# Patient Record
Sex: Male | Born: 1955 | Race: White | Hispanic: No | Marital: Married | Smoking: Current every day smoker
Health system: Southern US, Community
[De-identification: ages and names within clinical notes are randomized; demographics above are authoritative.]

## PROBLEM LIST (undated history)

## (undated) DIAGNOSIS — E559 Vitamin D deficiency, unspecified: Secondary | ICD-10-CM

## (undated) DIAGNOSIS — E785 Hyperlipidemia, unspecified: Secondary | ICD-10-CM

## (undated) DIAGNOSIS — M797 Fibromyalgia: Secondary | ICD-10-CM

## (undated) DIAGNOSIS — M545 Low back pain, unspecified: Secondary | ICD-10-CM

## (undated) DIAGNOSIS — I4891 Unspecified atrial fibrillation: Secondary | ICD-10-CM

## (undated) DIAGNOSIS — M1990 Unspecified osteoarthritis, unspecified site: Secondary | ICD-10-CM

## (undated) DIAGNOSIS — G8929 Other chronic pain: Secondary | ICD-10-CM

## (undated) DIAGNOSIS — I340 Nonrheumatic mitral (valve) insufficiency: Secondary | ICD-10-CM

## (undated) DIAGNOSIS — E119 Type 2 diabetes mellitus without complications: Secondary | ICD-10-CM

## (undated) DIAGNOSIS — I10 Essential (primary) hypertension: Secondary | ICD-10-CM

## (undated) HISTORY — DX: Type 2 diabetes mellitus without complications: E11.9

## (undated) HISTORY — PX: HERNIA REPAIR: SHX51

## (undated) HISTORY — DX: Low back pain: M54.5

## (undated) HISTORY — DX: Fibromyalgia: M79.7

## (undated) HISTORY — DX: Low back pain, unspecified: M54.50

## (undated) HISTORY — DX: Other chronic pain: G89.29

## (undated) HISTORY — DX: Vitamin D deficiency, unspecified: E55.9

## (undated) HISTORY — DX: Unspecified atrial fibrillation: I48.91

## (undated) HISTORY — DX: Nonrheumatic mitral (valve) insufficiency: I34.0

## (undated) HISTORY — DX: Essential (primary) hypertension: I10

## (undated) HISTORY — DX: Hyperlipidemia, unspecified: E78.5

---

## 2009-01-24 ENCOUNTER — Encounter: Admission: RE | Admit: 2009-01-24 | Discharge: 2009-04-24 | Payer: Self-pay | Admitting: Family Medicine

## 2009-04-26 ENCOUNTER — Encounter: Admission: RE | Admit: 2009-04-26 | Discharge: 2009-07-25 | Payer: Self-pay | Admitting: Family Medicine

## 2010-02-18 ENCOUNTER — Encounter
Admission: RE | Admit: 2010-02-18 | Discharge: 2010-02-20 | Payer: Self-pay | Admitting: Physical Medicine & Rehabilitation

## 2010-02-20 ENCOUNTER — Ambulatory Visit: Payer: Self-pay | Admitting: Physical Medicine & Rehabilitation

## 2010-05-07 ENCOUNTER — Encounter
Admission: RE | Admit: 2010-05-07 | Discharge: 2010-05-07 | Payer: Self-pay | Admitting: Physical Medicine & Rehabilitation

## 2011-07-22 ENCOUNTER — Ambulatory Visit: Payer: Medicare Other | Attending: Anesthesiology | Admitting: Physical Therapy

## 2011-07-22 DIAGNOSIS — M545 Low back pain, unspecified: Secondary | ICD-10-CM | POA: Insufficient documentation

## 2011-07-22 DIAGNOSIS — R5381 Other malaise: Secondary | ICD-10-CM | POA: Insufficient documentation

## 2011-07-22 DIAGNOSIS — IMO0001 Reserved for inherently not codable concepts without codable children: Secondary | ICD-10-CM | POA: Insufficient documentation

## 2011-09-28 ENCOUNTER — Encounter (HOSPITAL_BASED_OUTPATIENT_CLINIC_OR_DEPARTMENT_OTHER): Payer: Medicare Other

## 2011-09-30 ENCOUNTER — Encounter (HOSPITAL_BASED_OUTPATIENT_CLINIC_OR_DEPARTMENT_OTHER): Payer: Medicare Other

## 2011-12-01 DIAGNOSIS — G541 Lumbosacral plexus disorders: Secondary | ICD-10-CM | POA: Diagnosis not present

## 2011-12-01 DIAGNOSIS — G608 Other hereditary and idiopathic neuropathies: Secondary | ICD-10-CM | POA: Diagnosis not present

## 2011-12-01 DIAGNOSIS — M545 Low back pain, unspecified: Secondary | ICD-10-CM | POA: Diagnosis not present

## 2011-12-01 DIAGNOSIS — F341 Dysthymic disorder: Secondary | ICD-10-CM | POA: Diagnosis not present

## 2011-12-30 DIAGNOSIS — G541 Lumbosacral plexus disorders: Secondary | ICD-10-CM | POA: Diagnosis not present

## 2011-12-30 DIAGNOSIS — F341 Dysthymic disorder: Secondary | ICD-10-CM | POA: Diagnosis not present

## 2011-12-30 DIAGNOSIS — H81399 Other peripheral vertigo, unspecified ear: Secondary | ICD-10-CM | POA: Diagnosis not present

## 2011-12-30 DIAGNOSIS — M542 Cervicalgia: Secondary | ICD-10-CM | POA: Diagnosis not present

## 2011-12-30 DIAGNOSIS — M545 Low back pain, unspecified: Secondary | ICD-10-CM | POA: Diagnosis not present

## 2011-12-30 DIAGNOSIS — R269 Unspecified abnormalities of gait and mobility: Secondary | ICD-10-CM | POA: Diagnosis not present

## 2011-12-30 DIAGNOSIS — G608 Other hereditary and idiopathic neuropathies: Secondary | ICD-10-CM | POA: Diagnosis not present

## 2011-12-30 DIAGNOSIS — Z79899 Other long term (current) drug therapy: Secondary | ICD-10-CM | POA: Diagnosis not present

## 2012-01-23 DIAGNOSIS — I10 Essential (primary) hypertension: Secondary | ICD-10-CM | POA: Diagnosis not present

## 2012-01-23 DIAGNOSIS — E785 Hyperlipidemia, unspecified: Secondary | ICD-10-CM | POA: Diagnosis not present

## 2012-01-27 DIAGNOSIS — G541 Lumbosacral plexus disorders: Secondary | ICD-10-CM | POA: Diagnosis not present

## 2012-01-27 DIAGNOSIS — H60509 Unspecified acute noninfective otitis externa, unspecified ear: Secondary | ICD-10-CM | POA: Diagnosis not present

## 2012-01-27 DIAGNOSIS — F341 Dysthymic disorder: Secondary | ICD-10-CM | POA: Diagnosis not present

## 2012-01-27 DIAGNOSIS — G608 Other hereditary and idiopathic neuropathies: Secondary | ICD-10-CM | POA: Diagnosis not present

## 2012-01-27 DIAGNOSIS — H81399 Other peripheral vertigo, unspecified ear: Secondary | ICD-10-CM | POA: Diagnosis not present

## 2012-02-05 DIAGNOSIS — R51 Headache: Secondary | ICD-10-CM | POA: Diagnosis not present

## 2012-03-03 DIAGNOSIS — G541 Lumbosacral plexus disorders: Secondary | ICD-10-CM | POA: Diagnosis not present

## 2012-03-03 DIAGNOSIS — G608 Other hereditary and idiopathic neuropathies: Secondary | ICD-10-CM | POA: Diagnosis not present

## 2012-03-03 DIAGNOSIS — H81399 Other peripheral vertigo, unspecified ear: Secondary | ICD-10-CM | POA: Diagnosis not present

## 2012-03-03 DIAGNOSIS — F341 Dysthymic disorder: Secondary | ICD-10-CM | POA: Diagnosis not present

## 2012-03-17 DIAGNOSIS — E785 Hyperlipidemia, unspecified: Secondary | ICD-10-CM | POA: Diagnosis not present

## 2012-03-17 DIAGNOSIS — IMO0001 Reserved for inherently not codable concepts without codable children: Secondary | ICD-10-CM | POA: Diagnosis not present

## 2012-03-17 DIAGNOSIS — Z1211 Encounter for screening for malignant neoplasm of colon: Secondary | ICD-10-CM | POA: Diagnosis not present

## 2012-03-17 DIAGNOSIS — M129 Arthropathy, unspecified: Secondary | ICD-10-CM | POA: Diagnosis not present

## 2012-03-17 DIAGNOSIS — F17200 Nicotine dependence, unspecified, uncomplicated: Secondary | ICD-10-CM | POA: Diagnosis not present

## 2012-03-17 DIAGNOSIS — D126 Benign neoplasm of colon, unspecified: Secondary | ICD-10-CM | POA: Diagnosis not present

## 2012-03-17 DIAGNOSIS — Z79899 Other long term (current) drug therapy: Secondary | ICD-10-CM | POA: Diagnosis not present

## 2012-03-17 DIAGNOSIS — I10 Essential (primary) hypertension: Secondary | ICD-10-CM | POA: Diagnosis not present

## 2012-03-17 LAB — HM COLONOSCOPY

## 2012-05-07 DIAGNOSIS — M542 Cervicalgia: Secondary | ICD-10-CM | POA: Diagnosis not present

## 2012-05-07 DIAGNOSIS — G541 Lumbosacral plexus disorders: Secondary | ICD-10-CM | POA: Diagnosis not present

## 2012-05-07 DIAGNOSIS — G608 Other hereditary and idiopathic neuropathies: Secondary | ICD-10-CM | POA: Diagnosis not present

## 2012-05-07 DIAGNOSIS — H81399 Other peripheral vertigo, unspecified ear: Secondary | ICD-10-CM | POA: Diagnosis not present

## 2012-05-07 DIAGNOSIS — Z79899 Other long term (current) drug therapy: Secondary | ICD-10-CM | POA: Diagnosis not present

## 2012-05-07 DIAGNOSIS — F341 Dysthymic disorder: Secondary | ICD-10-CM | POA: Diagnosis not present

## 2012-06-04 DIAGNOSIS — G541 Lumbosacral plexus disorders: Secondary | ICD-10-CM | POA: Diagnosis not present

## 2012-06-04 DIAGNOSIS — F341 Dysthymic disorder: Secondary | ICD-10-CM | POA: Diagnosis not present

## 2012-06-04 DIAGNOSIS — G608 Other hereditary and idiopathic neuropathies: Secondary | ICD-10-CM | POA: Diagnosis not present

## 2012-06-04 DIAGNOSIS — H81399 Other peripheral vertigo, unspecified ear: Secondary | ICD-10-CM | POA: Diagnosis not present

## 2012-07-27 DIAGNOSIS — F341 Dysthymic disorder: Secondary | ICD-10-CM | POA: Diagnosis not present

## 2012-07-27 DIAGNOSIS — H81399 Other peripheral vertigo, unspecified ear: Secondary | ICD-10-CM | POA: Diagnosis not present

## 2012-07-27 DIAGNOSIS — Z79899 Other long term (current) drug therapy: Secondary | ICD-10-CM | POA: Diagnosis not present

## 2012-07-27 DIAGNOSIS — M542 Cervicalgia: Secondary | ICD-10-CM | POA: Diagnosis not present

## 2012-07-27 DIAGNOSIS — G608 Other hereditary and idiopathic neuropathies: Secondary | ICD-10-CM | POA: Diagnosis not present

## 2012-07-27 DIAGNOSIS — G541 Lumbosacral plexus disorders: Secondary | ICD-10-CM | POA: Diagnosis not present

## 2012-07-30 DIAGNOSIS — E785 Hyperlipidemia, unspecified: Secondary | ICD-10-CM | POA: Diagnosis not present

## 2012-07-30 DIAGNOSIS — I10 Essential (primary) hypertension: Secondary | ICD-10-CM | POA: Diagnosis not present

## 2012-07-31 LAB — BASIC METABOLIC PANEL
Creatinine: 1 mg/dL (ref 0.6–1.3)
Glucose: 93 mg/dL
Potassium: 4.2 mmol/L (ref 3.4–5.3)
Sodium: 140 mmol/L (ref 137–147)

## 2012-07-31 LAB — HEPATIC FUNCTION PANEL
ALT: 41 U/L — AB (ref 10–40)
AST: 33 U/L (ref 14–40)
Alkaline Phosphatase: 55 U/L (ref 25–125)

## 2012-07-31 LAB — LIPID PANEL
Cholesterol: 206 mg/dL — AB (ref 0–200)
HDL: 43 mg/dL (ref 35–70)
LDL Cholesterol: 86 mg/dL
Triglycerides: 385 mg/dL — AB (ref 40–160)

## 2012-08-16 DIAGNOSIS — H81399 Other peripheral vertigo, unspecified ear: Secondary | ICD-10-CM | POA: Diagnosis not present

## 2012-08-16 DIAGNOSIS — G608 Other hereditary and idiopathic neuropathies: Secondary | ICD-10-CM | POA: Diagnosis not present

## 2012-08-16 DIAGNOSIS — F341 Dysthymic disorder: Secondary | ICD-10-CM | POA: Diagnosis not present

## 2012-08-16 DIAGNOSIS — G541 Lumbosacral plexus disorders: Secondary | ICD-10-CM | POA: Diagnosis not present

## 2012-10-04 DIAGNOSIS — H81399 Other peripheral vertigo, unspecified ear: Secondary | ICD-10-CM | POA: Diagnosis not present

## 2012-10-04 DIAGNOSIS — G608 Other hereditary and idiopathic neuropathies: Secondary | ICD-10-CM | POA: Diagnosis not present

## 2012-10-04 DIAGNOSIS — F341 Dysthymic disorder: Secondary | ICD-10-CM | POA: Diagnosis not present

## 2012-10-04 DIAGNOSIS — G541 Lumbosacral plexus disorders: Secondary | ICD-10-CM | POA: Diagnosis not present

## 2012-10-19 DIAGNOSIS — R1012 Left upper quadrant pain: Secondary | ICD-10-CM | POA: Diagnosis not present

## 2012-10-19 DIAGNOSIS — R319 Hematuria, unspecified: Secondary | ICD-10-CM | POA: Diagnosis not present

## 2012-11-08 DIAGNOSIS — M542 Cervicalgia: Secondary | ICD-10-CM | POA: Diagnosis not present

## 2012-11-08 DIAGNOSIS — Z79899 Other long term (current) drug therapy: Secondary | ICD-10-CM | POA: Diagnosis not present

## 2012-11-08 DIAGNOSIS — G541 Lumbosacral plexus disorders: Secondary | ICD-10-CM | POA: Diagnosis not present

## 2012-11-08 DIAGNOSIS — H81399 Other peripheral vertigo, unspecified ear: Secondary | ICD-10-CM | POA: Diagnosis not present

## 2012-11-08 DIAGNOSIS — F341 Dysthymic disorder: Secondary | ICD-10-CM | POA: Diagnosis not present

## 2012-11-08 DIAGNOSIS — G608 Other hereditary and idiopathic neuropathies: Secondary | ICD-10-CM | POA: Diagnosis not present

## 2012-12-06 DIAGNOSIS — H81399 Other peripheral vertigo, unspecified ear: Secondary | ICD-10-CM | POA: Diagnosis not present

## 2012-12-06 DIAGNOSIS — F341 Dysthymic disorder: Secondary | ICD-10-CM | POA: Diagnosis not present

## 2012-12-06 DIAGNOSIS — G541 Lumbosacral plexus disorders: Secondary | ICD-10-CM | POA: Diagnosis not present

## 2012-12-06 DIAGNOSIS — G608 Other hereditary and idiopathic neuropathies: Secondary | ICD-10-CM | POA: Diagnosis not present

## 2013-01-10 DIAGNOSIS — M542 Cervicalgia: Secondary | ICD-10-CM | POA: Diagnosis not present

## 2013-01-10 DIAGNOSIS — G608 Other hereditary and idiopathic neuropathies: Secondary | ICD-10-CM | POA: Diagnosis not present

## 2013-01-10 DIAGNOSIS — G541 Lumbosacral plexus disorders: Secondary | ICD-10-CM | POA: Diagnosis not present

## 2013-01-10 DIAGNOSIS — H81399 Other peripheral vertigo, unspecified ear: Secondary | ICD-10-CM | POA: Diagnosis not present

## 2013-01-10 DIAGNOSIS — F341 Dysthymic disorder: Secondary | ICD-10-CM | POA: Diagnosis not present

## 2013-01-10 DIAGNOSIS — Z79899 Other long term (current) drug therapy: Secondary | ICD-10-CM | POA: Diagnosis not present

## 2013-01-13 ENCOUNTER — Encounter: Payer: Self-pay | Admitting: *Deleted

## 2013-01-13 DIAGNOSIS — G8929 Other chronic pain: Secondary | ICD-10-CM | POA: Insufficient documentation

## 2013-01-13 DIAGNOSIS — E785 Hyperlipidemia, unspecified: Secondary | ICD-10-CM | POA: Insufficient documentation

## 2013-01-13 DIAGNOSIS — I10 Essential (primary) hypertension: Secondary | ICD-10-CM

## 2013-01-13 DIAGNOSIS — E1159 Type 2 diabetes mellitus with other circulatory complications: Secondary | ICD-10-CM | POA: Insufficient documentation

## 2013-01-13 DIAGNOSIS — M797 Fibromyalgia: Secondary | ICD-10-CM | POA: Insufficient documentation

## 2013-01-13 DIAGNOSIS — E559 Vitamin D deficiency, unspecified: Secondary | ICD-10-CM | POA: Insufficient documentation

## 2013-01-28 ENCOUNTER — Ambulatory Visit: Payer: Medicare Other

## 2013-01-28 ENCOUNTER — Ambulatory Visit (INDEPENDENT_AMBULATORY_CARE_PROVIDER_SITE_OTHER): Payer: Medicare Other | Admitting: Nurse Practitioner

## 2013-01-28 ENCOUNTER — Encounter: Payer: Self-pay | Admitting: Nurse Practitioner

## 2013-01-28 VITALS — BP 134/88 | HR 71 | Temp 99.2°F | Ht 71.0 in | Wt 207.0 lb

## 2013-01-28 DIAGNOSIS — E785 Hyperlipidemia, unspecified: Secondary | ICD-10-CM

## 2013-01-28 DIAGNOSIS — R079 Chest pain, unspecified: Secondary | ICD-10-CM

## 2013-01-28 DIAGNOSIS — K5900 Constipation, unspecified: Secondary | ICD-10-CM | POA: Diagnosis not present

## 2013-01-28 DIAGNOSIS — I10 Essential (primary) hypertension: Secondary | ICD-10-CM

## 2013-01-28 DIAGNOSIS — R0781 Pleurodynia: Secondary | ICD-10-CM

## 2013-01-28 LAB — COMPLETE METABOLIC PANEL WITH GFR
ALT: 39 U/L (ref 0–53)
AST: 34 U/L (ref 0–37)
Albumin: 5 g/dL (ref 3.5–5.2)
Alkaline Phosphatase: 56 U/L (ref 39–117)
BUN: 21 mg/dL (ref 6–23)
CO2: 27 mEq/L (ref 19–32)
Calcium: 10.2 mg/dL (ref 8.4–10.5)
Chloride: 100 mEq/L (ref 96–112)
Creat: 1 mg/dL (ref 0.50–1.35)
GFR, Est African American: >89 mL/min
GFR, Est Non African American: 83 mL/min
Glucose, Bld: 99 mg/dL (ref 70–99)
Potassium: 4.7 mEq/L (ref 3.5–5.3)
Sodium: 139 mEq/L (ref 135–145)
Total Bilirubin: 0.5 mg/dL (ref 0.3–1.2)
Total Protein: 7.5 g/dL (ref 6.0–8.3)

## 2013-01-28 MED ORDER — FELODIPINE ER 5 MG PO TB24: 5.0000 mg | ORAL_TABLET | Freq: Every day | ORAL | Status: DC

## 2013-01-28 MED ORDER — LOSARTAN POTASSIUM 100 MG PO TABS: 100.0000 mg | ORAL_TABLET | Freq: Every day | ORAL | Status: DC

## 2013-01-28 MED ORDER — FENOFIBRATE 160 MG PO TABS: 160.0000 mg | ORAL_TABLET | Freq: Every day | ORAL | Status: DC

## 2013-01-28 MED ORDER — PRAVASTATIN SODIUM 40 MG PO TABS: 40.0000 mg | ORAL_TABLET | Freq: Every day | ORAL | Status: DC

## 2013-01-28 MED ORDER — HYDROCHLOROTHIAZIDE 25 MG PO TABS: 25.0000 mg | ORAL_TABLET | Freq: Every day | ORAL | Status: DC

## 2013-01-28 NOTE — Patient Instructions (Signed)

## 2013-01-28 NOTE — Progress Notes (Signed)
  Subjective:    Patient ID: Michael Valenzuela, male    DOB: 1956/06/15, 57 y.o.   MRN: [id number]  HPI Patient in complaining of pain in L side. Started . intermittent. Rates pain 4/10. Nothing Helps pain. Movement increases pain. Describes the pain as dull. Denies injury.  Patient Active Problem List  Diagnosis  . Fibromyalgia  . Chronic low back pain  . Vitamin D deficiency  . Essential hypertension, benign  . Other and unspecified hyperlipidemia   Current outpatient prescriptions:felodipine (PLENDIL) 5 MG 24 hr tablet, Take 5 mg by mouth daily., Disp: , Rfl: ;  fenofibrate 160 MG tablet, Take 160 mg by mouth daily., Disp: , Rfl: ;  hydrochlorothiazide (HYDRODIURIL) 25 MG tablet, Take 25 mg by mouth daily., Disp: , Rfl: ;  losartan (COZAAR) 100 MG tablet, Take 100 mg by mouth daily., Disp: , Rfl:  oxyCODONE (ROXICODONE) 15 MG immediate release tablet, Take 15 mg by mouth 4 (four) times daily., Disp: , Rfl: ;  pravastatin (PRAVACHOL) 40 MG tablet, Take 40 mg by mouth daily., Disp: , Rfl: ;  rosuvastatin (CRESTOR) 10 MG tablet, Take 10 mg by mouth daily., Disp: , Rfl:     Review of Systems  Constitutional: Negative.   Respiratory: Negative.   Cardiovascular: Negative.   Musculoskeletal: Positive for myalgias (L side).  Neurological: Negative.        Objective:   Physical Exam  Constitutional: He appears well-developed and well-nourished.  HENT:  Head: Normocephalic and atraumatic.  Right Ear: External ear normal.  Left Ear: External ear normal.  Eyes: Conjunctivae and EOM are normal. Pupils are equal, round, and reactive to light.  Neck: Normal range of motion.  Cardiovascular: Normal rate, regular rhythm and normal heart sounds.   Pulmonary/Chest: Effort normal and breath sounds normal.  Abdominal: Soft. Bowel sounds are normal.  Musculoskeletal:  L side tenderness on palpation  Skin: Skin is warm and dry.   BP 134/88  Pulse 71  Temp(Src) 99.2 F (37.3 C) (Oral)  Ht  5\' 11"  (1.803 m)  Wt 207 lb (93.895 kg)  BMI 28.88 kg/m2  X-ray Moderate amount of stool at splenic flexure       Assessment & Plan:  L side pain/ Moderate stool burden  miralax OTC daily  Stool softeners as needed  Force fluids  Increase fiber in diet  HTN/ Hyperlipidemia/ Fibromyalgia  Continue current meds  Wait on labs to decide on meds  Diet and exercise encouraged  FU in 3 months  Mary-Margaret Daphine Deutscher, FNP

## 2013-01-31 ENCOUNTER — Ambulatory Visit: Payer: Self-pay | Admitting: Nurse Practitioner

## 2013-02-02 ENCOUNTER — Telehealth: Payer: Self-pay | Admitting: *Deleted

## 2013-02-02 LAB — NMR LIPOPROFILE WITH LIPIDS
Cholesterol, Total: 142 mg/dL (ref <200)
HDL Particle Number: 34.7 umol/L (ref >=30.5)
HDL Size: 8.4 nm — ABNORMAL LOW (ref >=9.2)
HDL-C: 35 mg/dL — ABNORMAL LOW (ref >=40)
LDL (calc): 47 mg/dL (ref <100)
LDL Particle Number: 1584 nmol/L — ABNORMAL HIGH (ref <1000)
LDL Size: 19.5 nm — ABNORMAL LOW (ref >20.5)
LP-IR Score: 66 — ABNORMAL HIGH (ref <=45)
Large HDL-P: <1.3 umol/L — ABNORMAL LOW (ref >=4.8)
Large VLDL-P: 2 nmol/L (ref <=2.7)
Small LDL Particle Number: 1400 nmol/L — ABNORMAL HIGH (ref <=527)
Triglycerides: 299 mg/dL — ABNORMAL HIGH (ref <150)
VLDL Size: 45.6 nm (ref >=46.6)

## 2013-02-02 NOTE — Telephone Encounter (Signed)
Left message on machine to call back  

## 2013-02-02 NOTE — Progress Notes (Signed)
Left message

## 2013-02-02 NOTE — Progress Notes (Signed)
Patient aware of labs.  

## 2013-02-11 DIAGNOSIS — H81399 Other peripheral vertigo, unspecified ear: Secondary | ICD-10-CM | POA: Diagnosis not present

## 2013-02-11 DIAGNOSIS — H8190 Unspecified disorder of vestibular function, unspecified ear: Secondary | ICD-10-CM | POA: Diagnosis not present

## 2013-02-11 DIAGNOSIS — G608 Other hereditary and idiopathic neuropathies: Secondary | ICD-10-CM | POA: Diagnosis not present

## 2013-02-11 DIAGNOSIS — G541 Lumbosacral plexus disorders: Secondary | ICD-10-CM | POA: Diagnosis not present

## 2013-03-11 DIAGNOSIS — H81399 Other peripheral vertigo, unspecified ear: Secondary | ICD-10-CM | POA: Diagnosis not present

## 2013-03-11 DIAGNOSIS — G541 Lumbosacral plexus disorders: Secondary | ICD-10-CM | POA: Diagnosis not present

## 2013-03-11 DIAGNOSIS — F341 Dysthymic disorder: Secondary | ICD-10-CM | POA: Diagnosis not present

## 2013-03-11 DIAGNOSIS — G608 Other hereditary and idiopathic neuropathies: Secondary | ICD-10-CM | POA: Diagnosis not present

## 2013-03-14 ENCOUNTER — Other Ambulatory Visit: Payer: Self-pay

## 2013-03-14 MED ORDER — ROSUVASTATIN CALCIUM 10 MG PO TABS: 10.0000 mg | ORAL_TABLET | Freq: Every day | ORAL | Status: DC

## 2013-03-14 NOTE — Telephone Encounter (Signed)
Lipids 9/13

## 2013-03-17 ENCOUNTER — Other Ambulatory Visit: Payer: Self-pay | Admitting: *Deleted

## 2013-03-17 MED ORDER — ROSUVASTATIN CALCIUM 20 MG PO TABS: 20.0000 mg | ORAL_TABLET | Freq: Every day | ORAL | Status: DC

## 2013-03-17 NOTE — Telephone Encounter (Signed)
CRESTOR 10 WAS RF ON 5/5 BUT PT ON 20MG . THANKS

## 2013-04-08 DIAGNOSIS — G541 Lumbosacral plexus disorders: Secondary | ICD-10-CM | POA: Diagnosis not present

## 2013-04-08 DIAGNOSIS — M542 Cervicalgia: Secondary | ICD-10-CM | POA: Diagnosis not present

## 2013-04-08 DIAGNOSIS — H81399 Other peripheral vertigo, unspecified ear: Secondary | ICD-10-CM | POA: Diagnosis not present

## 2013-04-08 DIAGNOSIS — Z79899 Other long term (current) drug therapy: Secondary | ICD-10-CM | POA: Diagnosis not present

## 2013-04-08 DIAGNOSIS — F341 Dysthymic disorder: Secondary | ICD-10-CM | POA: Diagnosis not present

## 2013-04-08 DIAGNOSIS — G608 Other hereditary and idiopathic neuropathies: Secondary | ICD-10-CM | POA: Diagnosis not present

## 2013-04-12 ENCOUNTER — Other Ambulatory Visit: Payer: Self-pay | Admitting: Family Medicine

## 2013-05-06 ENCOUNTER — Ambulatory Visit: Payer: Medicare Other | Admitting: Nurse Practitioner

## 2013-05-19 DIAGNOSIS — H5210 Myopia, unspecified eye: Secondary | ICD-10-CM | POA: Diagnosis not present

## 2013-05-19 DIAGNOSIS — H25099 Other age-related incipient cataract, unspecified eye: Secondary | ICD-10-CM | POA: Diagnosis not present

## 2013-05-19 DIAGNOSIS — H43399 Other vitreous opacities, unspecified eye: Secondary | ICD-10-CM | POA: Diagnosis not present

## 2013-05-19 DIAGNOSIS — H538 Other visual disturbances: Secondary | ICD-10-CM | POA: Diagnosis not present

## 2013-05-20 DIAGNOSIS — G541 Lumbosacral plexus disorders: Secondary | ICD-10-CM | POA: Diagnosis not present

## 2013-05-20 DIAGNOSIS — G608 Other hereditary and idiopathic neuropathies: Secondary | ICD-10-CM | POA: Diagnosis not present

## 2013-05-20 DIAGNOSIS — H81399 Other peripheral vertigo, unspecified ear: Secondary | ICD-10-CM | POA: Diagnosis not present

## 2013-05-20 DIAGNOSIS — F341 Dysthymic disorder: Secondary | ICD-10-CM | POA: Diagnosis not present

## 2013-06-20 DIAGNOSIS — Z79899 Other long term (current) drug therapy: Secondary | ICD-10-CM | POA: Diagnosis not present

## 2013-06-20 DIAGNOSIS — M545 Low back pain, unspecified: Secondary | ICD-10-CM | POA: Diagnosis not present

## 2013-06-20 DIAGNOSIS — H81399 Other peripheral vertigo, unspecified ear: Secondary | ICD-10-CM | POA: Diagnosis not present

## 2013-06-20 DIAGNOSIS — G608 Other hereditary and idiopathic neuropathies: Secondary | ICD-10-CM | POA: Diagnosis not present

## 2013-06-20 DIAGNOSIS — H8190 Unspecified disorder of vestibular function, unspecified ear: Secondary | ICD-10-CM | POA: Diagnosis not present

## 2013-06-20 DIAGNOSIS — G541 Lumbosacral plexus disorders: Secondary | ICD-10-CM | POA: Diagnosis not present

## 2013-06-24 ENCOUNTER — Other Ambulatory Visit: Payer: Self-pay

## 2013-06-24 MED ORDER — ROSUVASTATIN CALCIUM 10 MG PO TABS: 10.0000 mg | ORAL_TABLET | Freq: Every day | ORAL | Status: DC

## 2013-07-19 DIAGNOSIS — G608 Other hereditary and idiopathic neuropathies: Secondary | ICD-10-CM | POA: Diagnosis not present

## 2013-07-19 DIAGNOSIS — H81399 Other peripheral vertigo, unspecified ear: Secondary | ICD-10-CM | POA: Diagnosis not present

## 2013-07-19 DIAGNOSIS — G541 Lumbosacral plexus disorders: Secondary | ICD-10-CM | POA: Diagnosis not present

## 2013-07-19 DIAGNOSIS — H8190 Unspecified disorder of vestibular function, unspecified ear: Secondary | ICD-10-CM | POA: Diagnosis not present

## 2013-08-01 ENCOUNTER — Telehealth: Payer: Self-pay | Admitting: Nurse Practitioner

## 2013-08-01 DIAGNOSIS — I10 Essential (primary) hypertension: Secondary | ICD-10-CM

## 2013-08-01 DIAGNOSIS — E785 Hyperlipidemia, unspecified: Secondary | ICD-10-CM

## 2013-08-02 MED ORDER — ROSUVASTATIN CALCIUM 10 MG PO TABS: 10.0000 mg | ORAL_TABLET | Freq: Every day | ORAL | Status: DC

## 2013-08-02 MED ORDER — HYDROCHLOROTHIAZIDE 25 MG PO TABS: 25.0000 mg | ORAL_TABLET | Freq: Every day | ORAL | Status: DC

## 2013-08-02 MED ORDER — LOSARTAN POTASSIUM 100 MG PO TABS: 100.0000 mg | ORAL_TABLET | Freq: Every day | ORAL | Status: DC

## 2013-08-02 MED ORDER — FELODIPINE ER 5 MG PO TB24: 5.0000 mg | ORAL_TABLET | Freq: Every day | ORAL | Status: DC

## 2013-08-02 MED ORDER — FENOFIBRATE 160 MG PO TABS: 160.0000 mg | ORAL_TABLET | Freq: Every day | ORAL | Status: DC

## 2013-08-02 NOTE — Telephone Encounter (Signed)
done

## 2013-08-09 DIAGNOSIS — M542 Cervicalgia: Secondary | ICD-10-CM | POA: Diagnosis not present

## 2013-08-23 DIAGNOSIS — M542 Cervicalgia: Secondary | ICD-10-CM | POA: Diagnosis not present

## 2013-08-23 DIAGNOSIS — M545 Low back pain, unspecified: Secondary | ICD-10-CM | POA: Diagnosis not present

## 2013-08-23 DIAGNOSIS — Z79899 Other long term (current) drug therapy: Secondary | ICD-10-CM | POA: Diagnosis not present

## 2013-08-23 DIAGNOSIS — H8190 Unspecified disorder of vestibular function, unspecified ear: Secondary | ICD-10-CM | POA: Diagnosis not present

## 2013-08-23 DIAGNOSIS — G608 Other hereditary and idiopathic neuropathies: Secondary | ICD-10-CM | POA: Diagnosis not present

## 2013-08-23 DIAGNOSIS — H81399 Other peripheral vertigo, unspecified ear: Secondary | ICD-10-CM | POA: Diagnosis not present

## 2013-08-23 DIAGNOSIS — G541 Lumbosacral plexus disorders: Secondary | ICD-10-CM | POA: Diagnosis not present

## 2013-08-31 ENCOUNTER — Ambulatory Visit (INDEPENDENT_AMBULATORY_CARE_PROVIDER_SITE_OTHER): Payer: Medicare Other | Admitting: Nurse Practitioner

## 2013-08-31 ENCOUNTER — Encounter: Payer: Self-pay | Admitting: Nurse Practitioner

## 2013-08-31 VITALS — BP 135/88 | HR 63 | Temp 98.1°F | Ht 71.0 in | Wt 208.0 lb

## 2013-08-31 DIAGNOSIS — G8929 Other chronic pain: Secondary | ICD-10-CM

## 2013-08-31 DIAGNOSIS — I10 Essential (primary) hypertension: Secondary | ICD-10-CM

## 2013-08-31 DIAGNOSIS — IMO0001 Reserved for inherently not codable concepts without codable children: Secondary | ICD-10-CM | POA: Diagnosis not present

## 2013-08-31 DIAGNOSIS — Z125 Encounter for screening for malignant neoplasm of prostate: Secondary | ICD-10-CM

## 2013-08-31 DIAGNOSIS — E538 Deficiency of other specified B group vitamins: Secondary | ICD-10-CM | POA: Diagnosis not present

## 2013-08-31 DIAGNOSIS — E559 Vitamin D deficiency, unspecified: Secondary | ICD-10-CM | POA: Diagnosis not present

## 2013-08-31 DIAGNOSIS — E785 Hyperlipidemia, unspecified: Secondary | ICD-10-CM | POA: Diagnosis not present

## 2013-08-31 DIAGNOSIS — D649 Anemia, unspecified: Secondary | ICD-10-CM | POA: Diagnosis not present

## 2013-08-31 DIAGNOSIS — M545 Low back pain, unspecified: Secondary | ICD-10-CM

## 2013-08-31 DIAGNOSIS — M797 Fibromyalgia: Secondary | ICD-10-CM

## 2013-08-31 DIAGNOSIS — Z79899 Other long term (current) drug therapy: Secondary | ICD-10-CM | POA: Diagnosis not present

## 2013-08-31 MED ORDER — FENOFIBRATE 160 MG PO TABS: 160.0000 mg | ORAL_TABLET | Freq: Every day | ORAL | Status: DC

## 2013-08-31 MED ORDER — FELODIPINE ER 5 MG PO TB24: 5.0000 mg | ORAL_TABLET | Freq: Every day | ORAL | Status: DC

## 2013-08-31 MED ORDER — LOSARTAN POTASSIUM 100 MG PO TABS: 100.0000 mg | ORAL_TABLET | Freq: Every day | ORAL | Status: DC

## 2013-08-31 MED ORDER — HYDROCHLOROTHIAZIDE 25 MG PO TABS: 25.0000 mg | ORAL_TABLET | Freq: Every day | ORAL | Status: DC

## 2013-08-31 MED ORDER — ROSUVASTATIN CALCIUM 10 MG PO TABS: 10.0000 mg | ORAL_TABLET | Freq: Every day | ORAL | Status: DC

## 2013-08-31 NOTE — Patient Instructions (Signed)
Health Maintenance, Males A healthy lifestyle and preventative care can promote health and wellness.  Maintain regular health, dental, and eye exams.  Eat a healthy diet. Foods like vegetables, fruits, whole grains, low-fat dairy products, and lean protein foods contain the nutrients you need without too many calories. Decrease your intake of foods high in solid fats, added sugars, and salt. Get information about a proper diet from your caregiver, if necessary.  Regular physical exercise is one of the most important things you can do for your health. Most adults should get at least 150 minutes of moderate-intensity exercise (any activity that increases your heart rate and causes you to sweat) each week. In addition, most adults need muscle-strengthening exercises on 2 or more days a week.   Maintain a healthy weight. The body mass index (BMI) is a screening tool to identify possible weight problems. It provides an estimate of body fat based on height and weight. Your caregiver can help determine your BMI, and can help you achieve or maintain a healthy weight. For adults 20 years and older:  A BMI below 18.5 is considered underweight.  A BMI of 18.5 to 24.9 is normal.  A BMI of 25 to 29.9 is considered overweight.  A BMI of 30 and above is considered obese.  Maintain normal blood lipids and cholesterol by exercising and minimizing your intake of saturated fat. Eat a balanced diet with plenty of fruits and vegetables. Blood tests for lipids and cholesterol should begin at age 20 and be repeated every 5 years. If your lipid or cholesterol levels are high, you are over 50, or you are a high risk for heart disease, you may need your cholesterol levels checked more frequently.Ongoing high lipid and cholesterol levels should be treated with medicines, if diet and exercise are not effective.  If you smoke, find out from your caregiver how to quit. If you do not use tobacco, do not start.  If you  choose to drink alcohol, do not exceed 2 drinks per day. One drink is considered to be 12 ounces (355 mL) of beer, 5 ounces (148 mL) of wine, or 1.5 ounces (44 mL) of liquor.  Avoid use of street drugs. Do not share needles with anyone. Ask for help if you need support or instructions about stopping the use of drugs.  High blood pressure causes heart disease and increases the risk of stroke. Blood pressure should be checked at least every 1 to 2 years. Ongoing high blood pressure should be treated with medicines if weight loss and exercise are not effective.  If you are 45 to 58 years old, ask your caregiver if you should take aspirin to prevent heart disease.  Diabetes screening involves taking a blood sample to check your fasting blood sugar level. This should be done once every 3 years, after age 45, if you are within normal weight and without risk factors for diabetes. Testing should be considered at a younger age or be carried out more frequently if you are overweight and have at least 1 risk factor for diabetes.  Colorectal cancer can be detected and often prevented. Most routine colorectal cancer screening begins at the age of 50 and continues through age 75. However, your caregiver may recommend screening at an earlier age if you have risk factors for colon cancer. On a yearly basis, your caregiver may provide home test kits to check for hidden blood in the stool. Use of a small camera at the end of a tube,   to directly examine the colon (sigmoidoscopy or colonoscopy), can detect the earliest forms of colorectal cancer. Talk to your caregiver about this at age 50, when routine screening begins. Direct examination of the colon should be repeated every 5 to 10 years through age 75, unless early forms of pre-cancerous polyps or small growths are found.  Hepatitis C blood testing is recommended for all people born from 1945 through 1965 and any individual with known risks for hepatitis C.  Healthy  men should no longer receive prostate-specific antigen (PSA) blood tests as part of routine cancer screening. Consult with your caregiver about prostate cancer screening.  Testicular cancer screening is not recommended for adolescents or adult males who have no symptoms. Screening includes self-exam, caregiver exam, and other screening tests. Consult with your caregiver about any symptoms you have or any concerns you have about testicular cancer.  Practice safe sex. Use condoms and avoid high-risk sexual practices to reduce the spread of sexually transmitted infections (STIs).  Use sunscreen with a sun protection factor (SPF) of 30 or greater. Apply sunscreen liberally and repeatedly throughout the day. You should seek shade when your shadow is shorter than you. Protect yourself by wearing long sleeves, pants, a wide-brimmed hat, and sunglasses year round, whenever you are outdoors.  Notify your caregiver of new moles or changes in moles, especially if there is a change in shape or color. Also notify your caregiver if a mole is larger than the size of a pencil eraser.  A one-time screening for abdominal aortic aneurysm (AAA) and surgical repair of large AAAs by sound wave imaging (ultrasonography) is recommended for ages 65 to 75 years who are current or former smokers.  Stay current with your immunizations. Document Released: 04/24/2008 Document Revised: 01/19/2012 Document Reviewed: 03/24/2011 [employer] Patient Information 2014 [employer].  

## 2013-08-31 NOTE — Progress Notes (Signed)
Subjective:    Patient ID: Michael Valenzuela, male    DOB: 06-11-1956, 57 y.o.   MRN: [id number]  HPI Patient in today for follow up of multiple medical problems- His only complaint in chronic pain- going to pain management an dis getting injections in neck an dback- otherwise he is doing okay. Patient Active Problem List   Diagnosis Date Noted  . Essential hypertension, benign 01/13/2013  . Other and unspecified hyperlipidemia 01/13/2013  . Fibromyalgia   . Chronic low back pain   . Vitamin D deficiency    Outpatient Encounter Prescriptions as of 08/31/2013  Medication Sig Dispense Refill  . felodipine (PLENDIL) 5 MG 24 hr tablet Take 1 tablet (5 mg total) by mouth daily.  90 tablet  0  . fenofibrate 160 MG tablet Take 1 tablet (160 mg total) by mouth daily.  90 tablet  0  . hydrochlorothiazide (HYDRODIURIL) 25 MG tablet Take 1 tablet (25 mg total) by mouth daily.  90 tablet  0  . losartan (COZAAR) 100 MG tablet Take 1 tablet (100 mg total) by mouth daily.  90 tablet  0  . oxyCODONE (ROXICODONE) 15 MG immediate release tablet Take 15 mg by mouth 4 (four) times daily.      . rosuvastatin (CRESTOR) 10 MG tablet Take 1 tablet (10 mg total) by mouth daily.  90 tablet  0  . [DISCONTINUED] rosuvastatin (CRESTOR) 20 MG tablet Take 1 tablet (20 mg total) by mouth daily.  30 tablet  1   No facility-administered encounter medications on file as of 08/31/2013.       Review of Systems  Constitutional: Negative.   HENT: Negative.   Eyes: Negative.   Respiratory: Negative.   Cardiovascular: Negative.   Gastrointestinal: Negative.   Musculoskeletal: Positive for arthralgias, back pain, neck pain and neck stiffness.  Neurological: Negative.   Psychiatric/Behavioral: Negative.        Objective:   Physical Exam  Constitutional: He is oriented to person, place, and time. He appears well-developed and well-nourished.  HENT:  Head: Normocephalic.  Right Ear: External ear normal.  Left Ear:  External ear normal.  Nose: Nose normal.  Mouth/Throat: Oropharynx is clear and moist.  Eyes: EOM are normal. Pupils are equal, round, and reactive to light.  Neck: Normal range of motion. Neck supple. No JVD present. No thyromegaly present.  Cardiovascular: Normal rate, regular rhythm, normal heart sounds and intact distal pulses.  Exam reveals no gallop and no friction rub.   No murmur heard. Pulmonary/Chest: Effort normal and breath sounds normal. No respiratory distress. He has no wheezes. He has no rales. He exhibits no tenderness.  Abdominal: Soft. Bowel sounds are normal. He exhibits no mass. There is no tenderness.  Musculoskeletal: Normal range of motion. He exhibits no edema.  Lymphadenopathy:    He has no cervical adenopathy.  Neurological: He is alert and oriented to person, place, and time. No cranial nerve deficit.  Skin: Skin is warm and dry.  Psychiatric: He has a normal mood and affect. His behavior is normal. Judgment and thought content normal.    BP 135/88  Pulse 63  Temp(Src) 98.1 F (36.7 C) (Oral)  Ht 5\' 11"  (1.803 m)  Wt 208 lb (94.348 kg)  BMI 29.02 kg/m2       Assessment & Plan:   1. Vitamin D deficiency   2. Other and unspecified hyperlipidemia   3. Fibromyalgia   4. Essential hypertension, benign   5. Chronic low back pain  6. HTN (hypertension)   7. Prostate cancer screening    Orders Placed This Encounter  Procedures  . Anemia Profile B  . CMP14+EGFR  . NMR, lipoprofile  . PSA, total and free   Meds ordered this encounter  Medications  . felodipine (PLENDIL) 5 MG 24 hr tablet    Sig: Take 1 tablet (5 mg total) by mouth daily.    Dispense:  90 tablet    Refill:  1    Order Specific Question:  Supervising Provider    Answer:  Ernestina Penna [[id number]]  . hydrochlorothiazide (HYDRODIURIL) 25 MG tablet    Sig: Take 1 tablet (25 mg total) by mouth daily.    Dispense:  90 tablet    Refill:  1    Order Specific Question:  Supervising  Provider    Answer:  Ernestina Penna [[id number]]  . fenofibrate 160 MG tablet    Sig: Take 1 tablet (160 mg total) by mouth daily.    Dispense:  90 tablet    Refill:  1    Order Specific Question:  Supervising Provider    Answer:  Ernestina Penna [[id number]]  . losartan (COZAAR) 100 MG tablet    Sig: Take 1 tablet (100 mg total) by mouth daily.    Dispense:  90 tablet    Refill:  1    Order Specific Question:  Supervising Provider    Answer:  Ernestina Penna [[id number]]  . rosuvastatin (CRESTOR) 10 MG tablet    Sig: Take 1 tablet (10 mg total) by mouth daily.    Dispense:  90 tablet    Refill:  1    Order Specific Question:  Supervising Provider    Answer:  Deborra Medina    Continue all meds Labs pending Diet and exercise encouraged Health maintenance reviewed Follow up in 3 months  Mary-Margaret Daphine Deutscher, FNP

## 2013-09-02 LAB — ANEMIA PROFILE B
Basophils Absolute: 0 10*3/uL (ref 0.0–0.2)
Basos: 1 %
Eos: 5 %
Eosinophils Absolute: 0.3 10*3/uL (ref 0.0–0.4)
Ferritin: 186 ng/mL (ref 30–400)
Folate: >19.9 ng/mL (ref >3.0)
HCT: 45.3 % (ref 37.5–51.0)
Hemoglobin: 15.6 g/dL (ref 12.6–17.7)
Immature Grans (Abs): 0 10*3/uL (ref 0.0–0.1)
Immature Granulocytes: 0 %
Iron Saturation: 33 % (ref 15–55)
Iron: 117 ug/dL (ref 40–155)
Lymphocytes Absolute: 1.9 10*3/uL (ref 0.7–3.1)
Lymphs: 35 %
MCH: 30.9 pg (ref 26.6–33.0)
MCHC: 34.4 g/dL (ref 31.5–35.7)
MCV: 90 fL (ref 79–97)
Monocytes Absolute: 0.5 10*3/uL (ref 0.1–0.9)
Monocytes: 9 %
Neutrophils Absolute: 2.8 10*3/uL (ref 1.4–7.0)
Neutrophils Relative %: 50 %
Platelets: 243 10*3/uL (ref 150–379)
RBC: 5.05 x10E6/uL (ref 4.14–5.80)
RDW: 13.4 % (ref 12.3–15.4)
Retic Ct Pct: 0.9 % (ref 0.6–2.6)
TIBC: 358 ug/dL (ref 250–450)
UIBC: 241 ug/dL (ref 150–375)
Vitamin B-12: 440 pg/mL (ref 211–946)
WBC: 5.5 10*3/uL (ref 3.4–10.8)

## 2013-09-02 LAB — PSA, TOTAL AND FREE
PSA, Free Pct: 11.1 %
PSA, Free: 0.2 ng/mL
PSA: 1.8 ng/mL (ref 0.0–4.0)

## 2013-09-02 LAB — CMP14+EGFR
ALT: 49 IU/L — ABNORMAL HIGH (ref 0–44)
AST: 45 IU/L — ABNORMAL HIGH (ref 0–40)
Albumin/Globulin Ratio: 2.1 (ref 1.1–2.5)
Albumin: 4.7 g/dL (ref 3.5–5.5)
Alkaline Phosphatase: 51 IU/L (ref 39–117)
BUN/Creatinine Ratio: 26 — ABNORMAL HIGH (ref 9–20)
BUN: 22 mg/dL (ref 6–24)
CO2: 25 mmol/L (ref 18–29)
Calcium: 9.6 mg/dL (ref 8.7–10.2)
Chloride: 101 mmol/L (ref 97–108)
Creatinine, Ser: 0.85 mg/dL (ref 0.76–1.27)
GFR calc Af Amer: 111 mL/min/{1.73_m2} (ref >59)
GFR calc non Af Amer: 96 mL/min/{1.73_m2} (ref >59)
Globulin, Total: 2.2 g/dL (ref 1.5–4.5)
Glucose: 109 mg/dL — ABNORMAL HIGH (ref 65–99)
Potassium: 4.6 mmol/L (ref 3.5–5.2)
Sodium: 143 mmol/L (ref 134–144)
Total Bilirubin: 0.4 mg/dL (ref 0.0–1.2)
Total Protein: 6.9 g/dL (ref 6.0–8.5)

## 2013-09-02 LAB — NMR, LIPOPROFILE
Cholesterol: 127 mg/dL (ref <200)
HDL Cholesterol by NMR: 40 mg/dL (ref >=40)
HDL Particle Number: 39.4 umol/L (ref >=30.5)
LDL Particle Number: 1220 nmol/L — ABNORMAL HIGH (ref <1000)
LDL Size: 19.8 nm — ABNORMAL LOW (ref >20.5)
LDLC SERPL CALC-MCNC: 44 mg/dL (ref <100)
LP-IR Score: 73 — ABNORMAL HIGH (ref <=45)
Small LDL Particle Number: 1005 nmol/L — ABNORMAL HIGH (ref <=527)
Triglycerides by NMR: 217 mg/dL — ABNORMAL HIGH (ref <150)

## 2013-09-20 DIAGNOSIS — G608 Other hereditary and idiopathic neuropathies: Secondary | ICD-10-CM | POA: Diagnosis not present

## 2013-09-20 DIAGNOSIS — H8190 Unspecified disorder of vestibular function, unspecified ear: Secondary | ICD-10-CM | POA: Diagnosis not present

## 2013-09-20 DIAGNOSIS — H81399 Other peripheral vertigo, unspecified ear: Secondary | ICD-10-CM | POA: Diagnosis not present

## 2013-09-20 DIAGNOSIS — G541 Lumbosacral plexus disorders: Secondary | ICD-10-CM | POA: Diagnosis not present

## 2013-10-04 DIAGNOSIS — M542 Cervicalgia: Secondary | ICD-10-CM | POA: Diagnosis not present

## 2013-10-13 ENCOUNTER — Encounter: Payer: Self-pay | Admitting: *Deleted

## 2013-10-25 DIAGNOSIS — M545 Low back pain, unspecified: Secondary | ICD-10-CM | POA: Diagnosis not present

## 2013-10-25 DIAGNOSIS — M5137 Other intervertebral disc degeneration, lumbosacral region: Secondary | ICD-10-CM | POA: Diagnosis not present

## 2013-10-25 DIAGNOSIS — R209 Unspecified disturbances of skin sensation: Secondary | ICD-10-CM | POA: Diagnosis not present

## 2013-10-25 DIAGNOSIS — S335XXA Sprain of ligaments of lumbar spine, initial encounter: Secondary | ICD-10-CM | POA: Diagnosis not present

## 2013-10-25 DIAGNOSIS — M629 Disorder of muscle, unspecified: Secondary | ICD-10-CM | POA: Diagnosis not present

## 2013-10-25 DIAGNOSIS — M542 Cervicalgia: Secondary | ICD-10-CM | POA: Diagnosis not present

## 2013-10-25 DIAGNOSIS — Z79899 Other long term (current) drug therapy: Secondary | ICD-10-CM | POA: Diagnosis not present

## 2013-10-25 DIAGNOSIS — M5430 Sciatica, unspecified side: Secondary | ICD-10-CM | POA: Diagnosis not present

## 2013-11-22 DIAGNOSIS — M5430 Sciatica, unspecified side: Secondary | ICD-10-CM | POA: Diagnosis not present

## 2013-11-22 DIAGNOSIS — S139XXA Sprain of joints and ligaments of unspecified parts of neck, initial encounter: Secondary | ICD-10-CM | POA: Diagnosis not present

## 2013-11-22 DIAGNOSIS — M533 Sacrococcygeal disorders, not elsewhere classified: Secondary | ICD-10-CM | POA: Diagnosis not present

## 2013-11-22 DIAGNOSIS — M545 Low back pain, unspecified: Secondary | ICD-10-CM | POA: Diagnosis not present

## 2013-11-22 DIAGNOSIS — G608 Other hereditary and idiopathic neuropathies: Secondary | ICD-10-CM | POA: Diagnosis not present

## 2013-11-22 DIAGNOSIS — M5030 Other cervical disc degeneration, unspecified cervical region: Secondary | ICD-10-CM | POA: Diagnosis not present

## 2013-11-22 DIAGNOSIS — M5137 Other intervertebral disc degeneration, lumbosacral region: Secondary | ICD-10-CM | POA: Diagnosis not present

## 2013-12-02 DIAGNOSIS — M542 Cervicalgia: Secondary | ICD-10-CM | POA: Diagnosis not present

## 2013-12-05 ENCOUNTER — Ambulatory Visit: Payer: Medicare Other | Admitting: Nurse Practitioner

## 2013-12-07 ENCOUNTER — Ambulatory Visit (INDEPENDENT_AMBULATORY_CARE_PROVIDER_SITE_OTHER): Payer: Medicare Other | Admitting: Nurse Practitioner

## 2013-12-07 ENCOUNTER — Encounter: Payer: Self-pay | Admitting: Nurse Practitioner

## 2013-12-07 VITALS — BP 144/93 | HR 57 | Temp 99.8°F | Ht 71.0 in | Wt 207.0 lb

## 2013-12-07 DIAGNOSIS — M797 Fibromyalgia: Secondary | ICD-10-CM

## 2013-12-07 DIAGNOSIS — E785 Hyperlipidemia, unspecified: Secondary | ICD-10-CM

## 2013-12-07 DIAGNOSIS — E559 Vitamin D deficiency, unspecified: Secondary | ICD-10-CM

## 2013-12-07 DIAGNOSIS — IMO0001 Reserved for inherently not codable concepts without codable children: Secondary | ICD-10-CM

## 2013-12-07 DIAGNOSIS — I10 Essential (primary) hypertension: Secondary | ICD-10-CM | POA: Diagnosis not present

## 2013-12-07 DIAGNOSIS — G8929 Other chronic pain: Secondary | ICD-10-CM

## 2013-12-07 DIAGNOSIS — M545 Low back pain, unspecified: Secondary | ICD-10-CM

## 2013-12-07 MED ORDER — ROSUVASTATIN CALCIUM 10 MG PO TABS: 10.0000 mg | ORAL_TABLET | Freq: Every day | ORAL | Status: DC

## 2013-12-07 NOTE — Progress Notes (Signed)
Subjective:    Patient ID: Michael Valenzuela, male    DOB: Sep 10, 1956, 58 y.o.   MRN: [id number]  Patient here today for follow up-  Hypertension This is a chronic problem. The current episode started more than 1 year ago. The problem is unchanged. The problem is controlled. Pertinent negatives include no anxiety, chest pain, headaches, malaise/fatigue, orthopnea, palpitations, peripheral edema or shortness of breath. There are no associated agents to hypertension. Risk factors for coronary artery disease include dyslipidemia, family history, male gender and sedentary lifestyle. Past treatments include angiotensin blockers, diuretics and calcium channel blockers. The current treatment provides moderate improvement. Compliance problems include diet and exercise.   Hyperlipidemia This is a chronic problem. The current episode started more than 1 year ago. The problem is controlled. Recent lipid tests were reviewed and are normal. He has no history of diabetes, hypothyroidism or obesity. Factors aggravating his hyperlipidemia include thiazides. Pertinent negatives include no chest pain or shortness of breath. Current antihyperlipidemic treatment includes statins and fibric acid derivatives. The current treatment provides moderate improvement of lipids. Compliance problems include adherence to diet and adherence to exercise.  Risk factors for coronary artery disease include dyslipidemia, family history, hypertension, male sex and a sedentary lifestyle.  Chronic Low back pain- Dr. Lennox Grumbles pain management- has recently had 3 injections in back and neck which has helped some. Currently roxicodone QID   Review of Systems  Constitutional: Negative.  Negative for malaise/fatigue.  HENT: Negative.   Respiratory: Negative for shortness of breath.   Cardiovascular: Negative for chest pain, palpitations and orthopnea.  Musculoskeletal: Positive for arthralgias and back pain.  Neurological: Negative for headaches.    Psychiatric/Behavioral: Negative.   All other systems reviewed and are negative.       Objective:   Physical Exam  Constitutional: He is oriented to person, place, and time. He appears well-developed and well-nourished.  HENT:  Head: Normocephalic.  Right Ear: External ear normal.  Left Ear: External ear normal.  Nose: Nose normal.  Mouth/Throat: Oropharynx is clear and moist.  Eyes: EOM are normal. Pupils are equal, round, and reactive to light.  Neck: Normal range of motion. Neck supple. No JVD present. No thyromegaly present.  Cardiovascular: Normal rate, regular rhythm, normal heart sounds and intact distal pulses.  Exam reveals no gallop and no friction rub.   No murmur heard. Pulmonary/Chest: Effort normal and breath sounds normal. No respiratory distress. He has no wheezes. He has no rales. He exhibits no tenderness.  Abdominal: Soft. Bowel sounds are normal. He exhibits no mass. There is no tenderness.  Genitourinary:  Refuses rectal exam  Musculoskeletal: He exhibits no edema.  Walks hunched over- point tenderness up and down back and neck- decreased ROM of cervical and lumbar spine due to pain with all movement.  Lymphadenopathy:    He has no cervical adenopathy.  Neurological: He is alert and oriented to person, place, and time. He has normal reflexes. No cranial nerve deficit.  Skin: Skin is warm and dry.  Psychiatric: He has a normal mood and affect. His behavior is normal. Judgment and thought content normal.   BP 144/93  Pulse 57  Temp(Src) 99.8 F (37.7 C) (Oral)  Ht 5' 11"  (1.803 m)  Wt 207 lb (93.895 kg)  BMI 28.88 kg/m2        Assessment & Plan:   1. Fibromyalgia   2. Other and unspecified hyperlipidemia   3. Essential hypertension, benign   4. Chronic low back pain  5. Vitamin D deficiency    Orders Placed This Encounter  Procedures  . CMP14+EGFR  . NMR, lipoprofile   Meds ordered this encounter  Medications  . rosuvastatin (CRESTOR) 10  MG tablet    Sig: Take 1 tablet (10 mg total) by mouth daily.    Dispense:  90 tablet    Refill:  1    Order Specific Question:  Supervising Provider    Answer:  Chipper Herb [[id number]]    Labs pending Health maintenance reviewed Diet and exercise encouraged Continue all meds Follow up  In 3 months   [place], FNP

## 2013-12-07 NOTE — Patient Instructions (Signed)

## 2013-12-09 LAB — CMP14+EGFR
ALT: 43 IU/L (ref 0–44)
AST: 34 IU/L (ref 0–40)
Albumin/Globulin Ratio: 2 (ref 1.1–2.5)
Albumin: 4.9 g/dL (ref 3.5–5.5)
Alkaline Phosphatase: 58 IU/L (ref 39–117)
BUN/Creatinine Ratio: 20 (ref 9–20)
BUN: 19 mg/dL (ref 6–24)
CO2: 23 mmol/L (ref 18–29)
Calcium: 10.6 mg/dL — ABNORMAL HIGH (ref 8.7–10.2)
Chloride: 100 mmol/L (ref 97–108)
Creatinine, Ser: 0.97 mg/dL (ref 0.76–1.27)
GFR calc Af Amer: 99 mL/min/{1.73_m2} (ref >59)
GFR calc non Af Amer: 86 mL/min/{1.73_m2} (ref >59)
Globulin, Total: 2.4 g/dL (ref 1.5–4.5)
Glucose: 100 mg/dL — ABNORMAL HIGH (ref 65–99)
Potassium: 4.8 mmol/L (ref 3.5–5.2)
Sodium: 143 mmol/L (ref 134–144)
Total Bilirubin: 0.4 mg/dL (ref 0.0–1.2)
Total Protein: 7.3 g/dL (ref 6.0–8.5)

## 2013-12-09 LAB — NMR, LIPOPROFILE
Cholesterol: 123 mg/dL (ref <200)
HDL Cholesterol by NMR: 47 mg/dL (ref >=40)
HDL Particle Number: 37.8 umol/L (ref >=30.5)
LDL Particle Number: 964 nmol/L (ref <1000)
LDL Size: 20 nm — ABNORMAL LOW (ref >20.5)
LDLC SERPL CALC-MCNC: 48 mg/dL (ref <100)
LP-IR Score: 76 — ABNORMAL HIGH (ref <=45)
Small LDL Particle Number: 645 nmol/L — ABNORMAL HIGH (ref <=527)
Triglycerides by NMR: 138 mg/dL (ref <150)

## 2013-12-28 DIAGNOSIS — R209 Unspecified disturbances of skin sensation: Secondary | ICD-10-CM | POA: Diagnosis not present

## 2013-12-28 DIAGNOSIS — M542 Cervicalgia: Secondary | ICD-10-CM | POA: Diagnosis not present

## 2013-12-28 DIAGNOSIS — Z79899 Other long term (current) drug therapy: Secondary | ICD-10-CM | POA: Diagnosis not present

## 2013-12-28 DIAGNOSIS — M545 Low back pain, unspecified: Secondary | ICD-10-CM | POA: Diagnosis not present

## 2013-12-28 DIAGNOSIS — M5137 Other intervertebral disc degeneration, lumbosacral region: Secondary | ICD-10-CM | POA: Diagnosis not present

## 2014-01-25 DIAGNOSIS — M545 Low back pain, unspecified: Secondary | ICD-10-CM | POA: Diagnosis not present

## 2014-01-25 DIAGNOSIS — M542 Cervicalgia: Secondary | ICD-10-CM | POA: Diagnosis not present

## 2014-01-25 DIAGNOSIS — Z79899 Other long term (current) drug therapy: Secondary | ICD-10-CM | POA: Diagnosis not present

## 2014-01-25 DIAGNOSIS — M5430 Sciatica, unspecified side: Secondary | ICD-10-CM | POA: Diagnosis not present

## 2014-01-25 DIAGNOSIS — M533 Sacrococcygeal disorders, not elsewhere classified: Secondary | ICD-10-CM | POA: Diagnosis not present

## 2014-01-25 DIAGNOSIS — M5412 Radiculopathy, cervical region: Secondary | ICD-10-CM | POA: Diagnosis not present

## 2014-02-07 DIAGNOSIS — M542 Cervicalgia: Secondary | ICD-10-CM | POA: Diagnosis not present

## 2014-02-07 DIAGNOSIS — M545 Low back pain, unspecified: Secondary | ICD-10-CM | POA: Diagnosis not present

## 2014-02-07 DIAGNOSIS — M6281 Muscle weakness (generalized): Secondary | ICD-10-CM | POA: Diagnosis not present

## 2014-02-07 DIAGNOSIS — R269 Unspecified abnormalities of gait and mobility: Secondary | ICD-10-CM | POA: Diagnosis not present

## 2014-02-09 DIAGNOSIS — M542 Cervicalgia: Secondary | ICD-10-CM | POA: Diagnosis not present

## 2014-02-09 DIAGNOSIS — M545 Low back pain, unspecified: Secondary | ICD-10-CM | POA: Diagnosis not present

## 2014-02-09 DIAGNOSIS — M6281 Muscle weakness (generalized): Secondary | ICD-10-CM | POA: Diagnosis not present

## 2014-02-09 DIAGNOSIS — R269 Unspecified abnormalities of gait and mobility: Secondary | ICD-10-CM | POA: Diagnosis not present

## 2014-02-15 DIAGNOSIS — M542 Cervicalgia: Secondary | ICD-10-CM | POA: Diagnosis not present

## 2014-02-15 DIAGNOSIS — M545 Low back pain, unspecified: Secondary | ICD-10-CM | POA: Diagnosis not present

## 2014-02-15 DIAGNOSIS — M6281 Muscle weakness (generalized): Secondary | ICD-10-CM | POA: Diagnosis not present

## 2014-02-15 DIAGNOSIS — R269 Unspecified abnormalities of gait and mobility: Secondary | ICD-10-CM | POA: Diagnosis not present

## 2014-02-17 DIAGNOSIS — M545 Low back pain, unspecified: Secondary | ICD-10-CM | POA: Diagnosis not present

## 2014-02-17 DIAGNOSIS — M542 Cervicalgia: Secondary | ICD-10-CM | POA: Diagnosis not present

## 2014-02-17 DIAGNOSIS — M6281 Muscle weakness (generalized): Secondary | ICD-10-CM | POA: Diagnosis not present

## 2014-02-17 DIAGNOSIS — R269 Unspecified abnormalities of gait and mobility: Secondary | ICD-10-CM | POA: Diagnosis not present

## 2014-02-20 DIAGNOSIS — M545 Low back pain, unspecified: Secondary | ICD-10-CM | POA: Diagnosis not present

## 2014-02-20 DIAGNOSIS — M6281 Muscle weakness (generalized): Secondary | ICD-10-CM | POA: Diagnosis not present

## 2014-02-20 DIAGNOSIS — R269 Unspecified abnormalities of gait and mobility: Secondary | ICD-10-CM | POA: Diagnosis not present

## 2014-02-20 DIAGNOSIS — M542 Cervicalgia: Secondary | ICD-10-CM | POA: Diagnosis not present

## 2014-02-22 DIAGNOSIS — Z79899 Other long term (current) drug therapy: Secondary | ICD-10-CM | POA: Diagnosis not present

## 2014-02-22 DIAGNOSIS — M545 Low back pain, unspecified: Secondary | ICD-10-CM | POA: Diagnosis not present

## 2014-02-22 DIAGNOSIS — M533 Sacrococcygeal disorders, not elsewhere classified: Secondary | ICD-10-CM | POA: Diagnosis not present

## 2014-02-22 DIAGNOSIS — M5412 Radiculopathy, cervical region: Secondary | ICD-10-CM | POA: Diagnosis not present

## 2014-02-22 DIAGNOSIS — M542 Cervicalgia: Secondary | ICD-10-CM | POA: Diagnosis not present

## 2014-02-22 DIAGNOSIS — M5430 Sciatica, unspecified side: Secondary | ICD-10-CM | POA: Diagnosis not present

## 2014-03-08 ENCOUNTER — Encounter: Payer: Self-pay | Admitting: Nurse Practitioner

## 2014-03-08 ENCOUNTER — Ambulatory Visit (INDEPENDENT_AMBULATORY_CARE_PROVIDER_SITE_OTHER): Payer: Medicare Other | Admitting: Nurse Practitioner

## 2014-03-08 VITALS — BP 129/86 | HR 68 | Temp 97.2°F | Ht 71.0 in | Wt 213.0 lb

## 2014-03-08 DIAGNOSIS — I10 Essential (primary) hypertension: Secondary | ICD-10-CM | POA: Diagnosis not present

## 2014-03-08 DIAGNOSIS — G8929 Other chronic pain: Secondary | ICD-10-CM

## 2014-03-08 DIAGNOSIS — M545 Low back pain, unspecified: Secondary | ICD-10-CM

## 2014-03-08 DIAGNOSIS — E785 Hyperlipidemia, unspecified: Secondary | ICD-10-CM | POA: Diagnosis not present

## 2014-03-08 DIAGNOSIS — M797 Fibromyalgia: Secondary | ICD-10-CM

## 2014-03-08 DIAGNOSIS — E559 Vitamin D deficiency, unspecified: Secondary | ICD-10-CM

## 2014-03-08 DIAGNOSIS — IMO0001 Reserved for inherently not codable concepts without codable children: Secondary | ICD-10-CM | POA: Diagnosis not present

## 2014-03-08 MED ORDER — FELODIPINE ER 5 MG PO TB24: 5.0000 mg | ORAL_TABLET | Freq: Every day | ORAL | Status: DC

## 2014-03-08 MED ORDER — ROSUVASTATIN CALCIUM 10 MG PO TABS: 10.0000 mg | ORAL_TABLET | Freq: Every day | ORAL | Status: DC

## 2014-03-08 MED ORDER — FENOFIBRATE 160 MG PO TABS: 160.0000 mg | ORAL_TABLET | Freq: Every day | ORAL | Status: DC

## 2014-03-08 MED ORDER — HYDROCHLOROTHIAZIDE 25 MG PO TABS: 25.0000 mg | ORAL_TABLET | Freq: Every day | ORAL | Status: DC

## 2014-03-08 NOTE — Progress Notes (Signed)
Subjective:    Patient ID: Michael Valenzuela, male    DOB: Apr 06, 1956, 58 y.o.   MRN: [id number]  Patient here today for follow up of chronic medical problems.  Hypertension This is a chronic problem. The current episode started more than 1 year ago. The problem is unchanged. The problem is controlled. Pertinent negatives include no anxiety, chest pain, headaches, malaise/fatigue, orthopnea, palpitations, peripheral edema or shortness of breath. There are no associated agents to hypertension. Risk factors for coronary artery disease include dyslipidemia, family history, male gender and sedentary lifestyle. Past treatments include angiotensin blockers, diuretics and calcium channel blockers. The current treatment provides moderate improvement. Compliance problems include diet and exercise.   Hyperlipidemia This is a chronic problem. The current episode started more than 1 year ago. The problem is controlled. Recent lipid tests were reviewed and are normal. He has no history of diabetes, hypothyroidism or obesity. Factors aggravating his hyperlipidemia include thiazides. Pertinent negatives include no chest pain or shortness of breath. Current antihyperlipidemic treatment includes statins and fibric acid derivatives. The current treatment provides moderate improvement of lipids. Compliance problems include adherence to diet and adherence to exercise.  Risk factors for coronary artery disease include dyslipidemia, family history, hypertension, male sex and a sedentary lifestyle.  Chronic Low back pain- Dr. Lennox Grumbles pain management- has recently had 3 injections in back and neck which has helped some. Currently roxicodone QID. He recommended PT but patient said he tried it and did not help. depression Refuses treatment even though he scored high on depression screen.   Review of Systems  Constitutional: Negative.  Negative for malaise/fatigue.  HENT: Negative.   Respiratory: Negative for shortness of breath.     Cardiovascular: Negative for chest pain, palpitations and orthopnea.  Musculoskeletal: Positive for arthralgias and back pain.  Neurological: Negative for headaches.  Psychiatric/Behavioral: Negative.   All other systems reviewed and are negative.      Objective:   Physical Exam  Constitutional: He is oriented to person, place, and time. He appears well-developed and well-nourished.  HENT:  Head: Normocephalic.  Right Ear: External ear normal.  Left Ear: External ear normal.  Nose: Nose normal.  Mouth/Throat: Oropharynx is clear and moist.  Eyes: EOM are normal. Pupils are equal, round, and reactive to light.  Neck: Normal range of motion. Neck supple. No JVD present. No thyromegaly present.  Cardiovascular: Normal rate, regular rhythm, normal heart sounds and intact distal pulses.  Exam reveals no gallop and no friction rub.   No murmur heard. Pulmonary/Chest: Effort normal and breath sounds normal. No respiratory distress. He has no wheezes. He has no rales. He exhibits no tenderness.  Abdominal: Soft. Bowel sounds are normal. He exhibits no mass. There is no tenderness.  Genitourinary:  Refuses rectal exam  Musculoskeletal: He exhibits no edema.  Walks hunched over- point tenderness up and down back and neck- decreased ROM of cervical and lumbar spine due to pain with all movement. (+) slr at 45 degrees bil Motor strength and sensation bil lower ext intact.  Lymphadenopathy:    He has no cervical adenopathy.  Neurological: He is alert and oriented to person, place, and time. He has normal reflexes. No cranial nerve deficit.  Skin: Skin is warm and dry.  Psychiatric: He has a normal mood and affect. His behavior is normal. Judgment and thought content normal.   BP 129/86  Pulse 68  Temp(Src) 97.2 F (36.2 C) (Oral)  Ht _0  (1.803 m)  Wt 213 lb (96.616  kg)  BMI 29.72 kg/m2  EKG- Sinus bradycardia-[name] Done, FNP       Assessment & Plan:   1. Vitamin D  deficiency   2. Other and unspecified hyperlipidemia   3. Fibromyalgia   4. Essential hypertension, benign   5. Chronic low back pain    Orders Placed This Encounter  Procedures  . CMP14+EGFR  . NMR, lipoprofile   Meds ordered this encounter  Medications  . felodipine (PLENDIL) 5 MG 24 hr tablet    Sig: Take 1 tablet (5 mg total) by mouth daily.    Dispense:  90 tablet    Refill:  1    Order Specific Question:  Supervising Provider    Answer:  Chipper Herb [[id number]]  . hydrochlorothiazide (HYDRODIURIL) 25 MG tablet    Sig: Take 1 tablet (25 mg total) by mouth daily.    Dispense:  90 tablet    Refill:  1    Order Specific Question:  Supervising Provider    Answer:  Chipper Herb [[id number]]  . fenofibrate 160 MG tablet    Sig: Take 1 tablet (160 mg total) by mouth daily.    Dispense:  90 tablet    Refill:  1    Order Specific Question:  Supervising Provider    Answer:  Chipper Herb [[id number]]  . rosuvastatin (CRESTOR) 10 MG tablet    Sig: Take 1 tablet (10 mg total) by mouth daily.    Dispense:  90 tablet    Refill:  1    Order Specific Question:  Supervising Provider    Answer:  Chipper Herb [[id number]]    Labs pending Health maintenance reviewed Diet and exercise encouraged Continue all meds Follow up  In 3 months   [place], FNP

## 2014-03-08 NOTE — Patient Instructions (Signed)
Back Exercises Back exercises help treat and prevent back injuries. The goal of back exercises is to increase the strength of your abdominal and back muscles and the flexibility of your back. These exercises should be started when you no longer have back pain. Back exercises include:  Pelvic Tilt. Lie on your back with your knees bent. Tilt your pelvis until the lower part of your back is against the floor. Hold this position 5 to 10 sec and repeat 5 to 10 times.  Knee to Chest. Pull first 1 knee up against your chest and hold for 20 to 30 seconds, repeat this with the other knee, and then both knees. This may be done with the other leg straight or bent, whichever feels better.  Sit-Ups or Curl-Ups. Bend your knees 90 degrees. Start with tilting your pelvis, and do a partial, slow sit-up, lifting your trunk only 30 to 45 degrees off the floor. Take at least 2 to 3 seconds for each sit-up. Do not do sit-ups with your knees out straight. If partial sit-ups are difficult, simply do the above but with only tightening your abdominal muscles and holding it as directed.  Hip-Lift. Lie on your back with your knees flexed 90 degrees. Push down with your feet and shoulders as you raise your hips a couple inches off the floor; hold for 10 seconds, repeat 5 to 10 times.  Back arches. Lie on your stomach, propping yourself up on bent elbows. Slowly press on your hands, causing an arch in your low back. Repeat 3 to 5 times. Any initial stiffness and discomfort should lessen with repetition over time.  Shoulder-Lifts. Lie face down with arms beside your body. Keep hips and torso pressed to floor as you slowly lift your head and shoulders off the floor. Do not overdo your exercises, especially in the beginning. Exercises may cause you some mild back discomfort which lasts for a few minutes; however, if the pain is more severe, or lasts for more than 15 minutes, do not continue exercises until you see your caregiver.  Improvement with exercise therapy for back problems is slow.  See your caregivers for assistance with developing a proper back exercise program. Document Released: 12/04/2004 Document Revised: 01/19/2012 Document Reviewed: 08/28/2011 Exit[employer] Patient Information 2014 [employer].  

## 2014-03-09 LAB — CMP14+EGFR
ALT: 86 IU/L — ABNORMAL HIGH (ref 0–44)
AST: 99 IU/L — ABNORMAL HIGH (ref 0–40)
Albumin/Globulin Ratio: 2.3 (ref 1.1–2.5)
Albumin: 4.9 g/dL (ref 3.5–5.5)
Alkaline Phosphatase: 51 IU/L (ref 39–117)
BUN/Creatinine Ratio: 18 (ref 9–20)
BUN: 18 mg/dL (ref 6–24)
CO2: 25 mmol/L (ref 18–29)
Calcium: 9.9 mg/dL (ref 8.7–10.2)
Chloride: 99 mmol/L (ref 97–108)
Creatinine, Ser: 0.98 mg/dL (ref 0.76–1.27)
GFR calc Af Amer: 98 mL/min/{1.73_m2} (ref >59)
GFR calc non Af Amer: 85 mL/min/{1.73_m2} (ref >59)
Globulin, Total: 2.1 g/dL (ref 1.5–4.5)
Glucose: 113 mg/dL — ABNORMAL HIGH (ref 65–99)
Potassium: 4.7 mmol/L (ref 3.5–5.2)
Sodium: 142 mmol/L (ref 134–144)
Total Bilirubin: 0.4 mg/dL (ref 0.0–1.2)
Total Protein: 7 g/dL (ref 6.0–8.5)

## 2014-03-09 LAB — NMR, LIPOPROFILE
Cholesterol: 150 mg/dL (ref <200)
HDL Cholesterol by NMR: 33 mg/dL — ABNORMAL LOW (ref >=40)
HDL Particle Number: 30.1 umol/L — ABNORMAL LOW (ref >=30.5)
LDL Particle Number: 1333 nmol/L — ABNORMAL HIGH (ref <1000)
LDL Size: 20 nm (ref >20.5)
LDLC SERPL CALC-MCNC: 65 mg/dL (ref <100)
LP-IR Score: 81 — ABNORMAL HIGH (ref <=45)
Small LDL Particle Number: 941 nmol/L — ABNORMAL HIGH (ref <=527)
Triglycerides by NMR: 261 mg/dL — ABNORMAL HIGH (ref <150)

## 2014-03-29 DIAGNOSIS — M25569 Pain in unspecified knee: Secondary | ICD-10-CM | POA: Diagnosis not present

## 2014-03-29 DIAGNOSIS — M5430 Sciatica, unspecified side: Secondary | ICD-10-CM | POA: Diagnosis not present

## 2014-03-29 DIAGNOSIS — M533 Sacrococcygeal disorders, not elsewhere classified: Secondary | ICD-10-CM | POA: Diagnosis not present

## 2014-03-29 DIAGNOSIS — G894 Chronic pain syndrome: Secondary | ICD-10-CM | POA: Diagnosis not present

## 2014-03-29 DIAGNOSIS — M545 Low back pain, unspecified: Secondary | ICD-10-CM | POA: Diagnosis not present

## 2014-03-29 DIAGNOSIS — Z79899 Other long term (current) drug therapy: Secondary | ICD-10-CM | POA: Diagnosis not present

## 2014-03-29 DIAGNOSIS — M542 Cervicalgia: Secondary | ICD-10-CM | POA: Diagnosis not present

## 2014-04-26 DIAGNOSIS — M5412 Radiculopathy, cervical region: Secondary | ICD-10-CM | POA: Diagnosis not present

## 2014-04-26 DIAGNOSIS — M5430 Sciatica, unspecified side: Secondary | ICD-10-CM | POA: Diagnosis not present

## 2014-04-26 DIAGNOSIS — M533 Sacrococcygeal disorders, not elsewhere classified: Secondary | ICD-10-CM | POA: Diagnosis not present

## 2014-04-26 DIAGNOSIS — Z79899 Other long term (current) drug therapy: Secondary | ICD-10-CM | POA: Diagnosis not present

## 2014-04-26 DIAGNOSIS — G894 Chronic pain syndrome: Secondary | ICD-10-CM | POA: Diagnosis not present

## 2014-04-26 DIAGNOSIS — G9009 Other idiopathic peripheral autonomic neuropathy: Secondary | ICD-10-CM | POA: Diagnosis not present

## 2014-04-26 DIAGNOSIS — M542 Cervicalgia: Secondary | ICD-10-CM | POA: Diagnosis not present

## 2014-04-26 DIAGNOSIS — M545 Low back pain, unspecified: Secondary | ICD-10-CM | POA: Diagnosis not present

## 2014-04-26 DIAGNOSIS — G9050 Complex regional pain syndrome I, unspecified: Secondary | ICD-10-CM | POA: Diagnosis not present

## 2014-04-28 ENCOUNTER — Telehealth: Payer: Self-pay | Admitting: Nurse Practitioner

## 2014-04-28 DIAGNOSIS — I10 Essential (primary) hypertension: Secondary | ICD-10-CM

## 2014-04-28 MED ORDER — LOSARTAN POTASSIUM 100 MG PO TABS: 100.0000 mg | ORAL_TABLET | Freq: Every day | ORAL | Status: DC

## 2014-04-28 NOTE — Telephone Encounter (Signed)
I sent the Rx to the pharmacy.

## 2014-05-23 DIAGNOSIS — M545 Low back pain, unspecified: Secondary | ICD-10-CM | POA: Diagnosis not present

## 2014-05-23 DIAGNOSIS — M961 Postlaminectomy syndrome, not elsewhere classified: Secondary | ICD-10-CM | POA: Diagnosis not present

## 2014-05-23 DIAGNOSIS — M5137 Other intervertebral disc degeneration, lumbosacral region: Secondary | ICD-10-CM | POA: Diagnosis not present

## 2014-05-23 DIAGNOSIS — M533 Sacrococcygeal disorders, not elsewhere classified: Secondary | ICD-10-CM | POA: Diagnosis not present

## 2014-06-09 ENCOUNTER — Encounter: Payer: Self-pay | Admitting: Nurse Practitioner

## 2014-06-09 ENCOUNTER — Ambulatory Visit (INDEPENDENT_AMBULATORY_CARE_PROVIDER_SITE_OTHER): Payer: Medicare Other | Admitting: Nurse Practitioner

## 2014-06-09 VITALS — BP 133/90 | HR 66 | Temp 98.0°F | Ht 71.0 in | Wt 218.0 lb

## 2014-06-09 DIAGNOSIS — M545 Low back pain, unspecified: Secondary | ICD-10-CM

## 2014-06-09 DIAGNOSIS — IMO0001 Reserved for inherently not codable concepts without codable children: Secondary | ICD-10-CM

## 2014-06-09 DIAGNOSIS — I10 Essential (primary) hypertension: Secondary | ICD-10-CM | POA: Diagnosis not present

## 2014-06-09 DIAGNOSIS — M797 Fibromyalgia: Secondary | ICD-10-CM

## 2014-06-09 DIAGNOSIS — G8929 Other chronic pain: Secondary | ICD-10-CM

## 2014-06-09 DIAGNOSIS — E785 Hyperlipidemia, unspecified: Secondary | ICD-10-CM | POA: Diagnosis not present

## 2014-06-09 MED ORDER — LOSARTAN POTASSIUM 100 MG PO TABS: 100.0000 mg | ORAL_TABLET | Freq: Every day | ORAL | Status: DC

## 2014-06-09 MED ORDER — FENOFIBRATE 160 MG PO TABS: 160.0000 mg | ORAL_TABLET | Freq: Every day | ORAL | Status: DC

## 2014-06-09 MED ORDER — HYDROCHLOROTHIAZIDE 25 MG PO TABS: 25.0000 mg | ORAL_TABLET | Freq: Every day | ORAL | Status: DC

## 2014-06-09 MED ORDER — FELODIPINE ER 5 MG PO TB24: 5.0000 mg | ORAL_TABLET | Freq: Every day | ORAL | Status: DC

## 2014-06-09 MED ORDER — ROSUVASTATIN CALCIUM 10 MG PO TABS: 10.0000 mg | ORAL_TABLET | Freq: Every day | ORAL | Status: DC

## 2014-06-09 NOTE — Patient Instructions (Signed)

## 2014-06-09 NOTE — Progress Notes (Signed)
Subjective:    Patient ID: Michael Valenzuela, male    DOB: June 09, 1956, 58 y.o.   MRN: [id number]  Patient here today for follow up of chronic medical problems.  Hypertension This is a chronic problem. The current episode started more than 1 year ago. The problem is unchanged. The problem is controlled. Pertinent negatives include no anxiety, chest pain, headaches, malaise/fatigue, orthopnea, palpitations, peripheral edema or shortness of breath. There are no associated agents to hypertension. Risk factors for coronary artery disease include dyslipidemia, family history, male gender and sedentary lifestyle. Past treatments include angiotensin blockers, diuretics and calcium channel blockers. The current treatment provides moderate improvement. Compliance problems include diet and exercise.   Hyperlipidemia This is a chronic problem. The current episode started more than 1 year ago. The problem is controlled. Recent lipid tests were reviewed and are normal. He has no history of diabetes, hypothyroidism or obesity. Factors aggravating his hyperlipidemia include thiazides. Pertinent negatives include no chest pain or shortness of breath. Current antihyperlipidemic treatment includes statins and fibric acid derivatives. The current treatment provides moderate improvement of lipids. Compliance problems include adherence to diet and adherence to exercise.  Risk factors for coronary artery disease include dyslipidemia, family history, hypertension, male sex and a sedentary lifestyle.  Chronic Low back pain- Dr. Lennox Grumbles pain management- has recently had 3 injections in back and neck which has helped some. Currently roxicodone QID. Patient has been seen out  In public on several occasions without limping like he does when he is in the office. depression Refuses treatment even though he scored high on depression screen.  Review of Systems  Constitutional: Negative.  Negative for malaise/fatigue.  HENT: Negative.     Respiratory: Negative for shortness of breath.   Cardiovascular: Negative for chest pain, palpitations and orthopnea.  Musculoskeletal: Positive for arthralgias and back pain.  Neurological: Negative for headaches.  Psychiatric/Behavioral: Negative.   All other systems reviewed and are negative.      Objective:   Physical Exam  Constitutional: He is oriented to person, place, and time. He appears well-developed and well-nourished.  HENT:  Head: Normocephalic.  Right Ear: External ear normal.  Left Ear: External ear normal.  Nose: Nose normal.  Mouth/Throat: Oropharynx is clear and moist.  Eyes: EOM are normal. Pupils are equal, round, and reactive to light.  Neck: Normal range of motion. Neck supple. No JVD present. No thyromegaly present.  Cardiovascular: Normal rate, regular rhythm, normal heart sounds and intact distal pulses.  Exam reveals no gallop and no friction rub.   No murmur heard. Pulmonary/Chest: Effort normal and breath sounds normal. No respiratory distress. He has no wheezes. He has no rales. He exhibits no tenderness.  Abdominal: Soft. Bowel sounds are normal. He exhibits no mass. There is no tenderness.  Genitourinary:  Refuses rectal exam  Musculoskeletal: He exhibits no edema.  Walks hunched over- point tenderness up and down back and neck- decreased ROM of cervical and lumbar spine due to pain with all movement. (+) slr at 45 degrees bil Motor strength and sensation bil lower ext intact.  Lymphadenopathy:    He has no cervical adenopathy.  Neurological: He is alert and oriented to person, place, and time. He has normal reflexes. No cranial nerve deficit.  Skin: Skin is warm and dry.  Psychiatric: He has a normal mood and affect. His behavior is normal. Judgment and thought content normal.   BP 133/90  Pulse 66  Temp(Src) 98 F (36.7 C) (Oral)  Ht 5'  11" (1.803 m)  Wt 218 lb (98.884 kg)  BMI 30.42 kg/m2         Assessment & Plan:   1.  Hyperlipidemia with target LDL less than 100   2. Fibromyalgia   3. Essential hypertension, benign   4. Chronic low back pain   5. Essential hypertension   6. Other and unspecified hyperlipidemia    Orders Placed This Encounter  Procedures  . CMP14+EGFR  . NMR, lipoprofile   Meds ordered this encounter  Medications  . losartan (COZAAR) 100 MG tablet    Sig: Take 1 tablet (100 mg total) by mouth daily.    Dispense:  90 tablet    Refill:  1    Order Specific Question:  Supervising Provider    Answer:  Chipper Herb [[id number]]  . hydrochlorothiazide (HYDRODIURIL) 25 MG tablet    Sig: Take 1 tablet (25 mg total) by mouth daily.    Dispense:  90 tablet    Refill:  1    Order Specific Question:  Supervising Provider    Answer:  Chipper Herb [[id number]]  . felodipine (PLENDIL) 5 MG 24 hr tablet    Sig: Take 1 tablet (5 mg total) by mouth daily.    Dispense:  90 tablet    Refill:  1    Order Specific Question:  Supervising Provider    Answer:  Chipper Herb [[id number]]  . rosuvastatin (CRESTOR) 10 MG tablet    Sig: Take 1 tablet (10 mg total) by mouth daily.    Dispense:  90 tablet    Refill:  1    Order Specific Question:  Supervising Provider    Answer:  Chipper Herb [[id number]]  . fenofibrate 160 MG tablet    Sig: Take 1 tablet (160 mg total) by mouth daily.    Dispense:  90 tablet    Refill:  1    Order Specific Question:  Supervising Provider    Answer:  Chipper Herb [[id number]]    Labs pending Health maintenance reviewed Diet and exercise encouraged Continue all meds Follow up  In 3 months   [place], FNP

## 2014-06-10 LAB — CMP14+EGFR
ALT: 56 IU/L — ABNORMAL HIGH (ref 0–44)
AST: 55 IU/L — ABNORMAL HIGH (ref 0–40)
Albumin/Globulin Ratio: 2.5 (ref 1.1–2.5)
Albumin: 4.9 g/dL (ref 3.5–5.5)
Alkaline Phosphatase: 59 IU/L (ref 39–117)
BUN/Creatinine Ratio: 19 (ref 9–20)
BUN: 18 mg/dL (ref 6–24)
CO2: 26 mmol/L (ref 18–29)
Calcium: 10 mg/dL (ref 8.7–10.2)
Chloride: 98 mmol/L (ref 97–108)
Creatinine, Ser: 0.95 mg/dL (ref 0.76–1.27)
GFR calc Af Amer: 102 mL/min/{1.73_m2} (ref >59)
GFR calc non Af Amer: 88 mL/min/{1.73_m2} (ref >59)
Globulin, Total: 2 g/dL (ref 1.5–4.5)
Glucose: 140 mg/dL — ABNORMAL HIGH (ref 65–99)
Potassium: 4.2 mmol/L (ref 3.5–5.2)
Sodium: 139 mmol/L (ref 134–144)
Total Bilirubin: 0.4 mg/dL (ref 0.0–1.2)
Total Protein: 6.9 g/dL (ref 6.0–8.5)

## 2014-06-10 LAB — NMR, LIPOPROFILE
Cholesterol: 143 mg/dL (ref 100–199)
HDL Cholesterol by NMR: 30 mg/dL — ABNORMAL LOW (ref >39)
HDL Particle Number: 30.8 umol/L (ref >=30.5)
LDL Particle Number: 1100 nmol/L — ABNORMAL HIGH (ref <1000)
LDL Size: 19.7 nm (ref >20.5)
LDLC SERPL CALC-MCNC: 63 mg/dL (ref 0–99)
LP-IR Score: 79 — ABNORMAL HIGH (ref <=45)
Small LDL Particle Number: 946 nmol/L — ABNORMAL HIGH (ref <=527)
Triglycerides by NMR: 251 mg/dL — ABNORMAL HIGH (ref 0–149)

## 2014-06-20 DIAGNOSIS — G894 Chronic pain syndrome: Secondary | ICD-10-CM | POA: Diagnosis not present

## 2014-06-20 DIAGNOSIS — M545 Low back pain, unspecified: Secondary | ICD-10-CM | POA: Diagnosis not present

## 2014-06-20 DIAGNOSIS — M5430 Sciatica, unspecified side: Secondary | ICD-10-CM | POA: Diagnosis not present

## 2014-06-20 DIAGNOSIS — M542 Cervicalgia: Secondary | ICD-10-CM | POA: Diagnosis not present

## 2014-06-20 DIAGNOSIS — Z79899 Other long term (current) drug therapy: Secondary | ICD-10-CM | POA: Diagnosis not present

## 2014-06-20 DIAGNOSIS — M533 Sacrococcygeal disorders, not elsewhere classified: Secondary | ICD-10-CM | POA: Diagnosis not present

## 2014-07-18 DIAGNOSIS — M5137 Other intervertebral disc degeneration, lumbosacral region: Secondary | ICD-10-CM | POA: Diagnosis not present

## 2014-07-18 DIAGNOSIS — G894 Chronic pain syndrome: Secondary | ICD-10-CM | POA: Diagnosis not present

## 2014-07-18 DIAGNOSIS — M545 Low back pain, unspecified: Secondary | ICD-10-CM | POA: Diagnosis not present

## 2014-07-18 DIAGNOSIS — M533 Sacrococcygeal disorders, not elsewhere classified: Secondary | ICD-10-CM | POA: Diagnosis not present

## 2014-07-18 DIAGNOSIS — M542 Cervicalgia: Secondary | ICD-10-CM | POA: Diagnosis not present

## 2014-07-18 DIAGNOSIS — Z79899 Other long term (current) drug therapy: Secondary | ICD-10-CM | POA: Diagnosis not present

## 2014-08-17 DIAGNOSIS — M5136 Other intervertebral disc degeneration, lumbar region: Secondary | ICD-10-CM | POA: Diagnosis not present

## 2014-08-17 DIAGNOSIS — Z79891 Long term (current) use of opiate analgesic: Secondary | ICD-10-CM | POA: Diagnosis not present

## 2014-08-17 DIAGNOSIS — M545 Low back pain: Secondary | ICD-10-CM | POA: Diagnosis not present

## 2014-08-17 DIAGNOSIS — G894 Chronic pain syndrome: Secondary | ICD-10-CM | POA: Diagnosis not present

## 2014-08-17 DIAGNOSIS — M4327 Fusion of spine, lumbosacral region: Secondary | ICD-10-CM | POA: Diagnosis not present

## 2014-08-17 DIAGNOSIS — M542 Cervicalgia: Secondary | ICD-10-CM | POA: Diagnosis not present

## 2014-09-11 ENCOUNTER — Ambulatory Visit (INDEPENDENT_AMBULATORY_CARE_PROVIDER_SITE_OTHER): Payer: Medicare Other | Admitting: Nurse Practitioner

## 2014-09-11 ENCOUNTER — Encounter: Payer: Self-pay | Admitting: Nurse Practitioner

## 2014-09-11 VITALS — BP 140/93 | HR 72 | Temp 99.0°F | Ht 71.0 in | Wt 217.8 lb

## 2014-09-11 DIAGNOSIS — G8929 Other chronic pain: Secondary | ICD-10-CM

## 2014-09-11 DIAGNOSIS — M797 Fibromyalgia: Secondary | ICD-10-CM | POA: Diagnosis not present

## 2014-09-11 DIAGNOSIS — E559 Vitamin D deficiency, unspecified: Secondary | ICD-10-CM | POA: Diagnosis not present

## 2014-09-11 DIAGNOSIS — I10 Essential (primary) hypertension: Secondary | ICD-10-CM

## 2014-09-11 DIAGNOSIS — E785 Hyperlipidemia, unspecified: Secondary | ICD-10-CM | POA: Diagnosis not present

## 2014-09-11 DIAGNOSIS — M545 Low back pain: Secondary | ICD-10-CM | POA: Diagnosis not present

## 2014-09-11 DIAGNOSIS — R739 Hyperglycemia, unspecified: Secondary | ICD-10-CM | POA: Diagnosis not present

## 2014-09-11 MED ORDER — LOSARTAN POTASSIUM 100 MG PO TABS: 100.0000 mg | ORAL_TABLET | Freq: Every day | ORAL | Status: DC

## 2014-09-11 MED ORDER — ROSUVASTATIN CALCIUM 10 MG PO TABS: 10.0000 mg | ORAL_TABLET | Freq: Every day | ORAL | Status: DC

## 2014-09-11 MED ORDER — FENOFIBRATE 160 MG PO TABS: 160.0000 mg | ORAL_TABLET | Freq: Every day | ORAL | Status: DC

## 2014-09-11 MED ORDER — FELODIPINE ER 5 MG PO TB24: 5.0000 mg | ORAL_TABLET | Freq: Every day | ORAL | Status: DC

## 2014-09-11 MED ORDER — HYDROCHLOROTHIAZIDE 25 MG PO TABS: 25.0000 mg | ORAL_TABLET | Freq: Every day | ORAL | Status: DC

## 2014-09-11 NOTE — Patient Instructions (Signed)
Back Pain, Adult Low back pain is very common. About 1 in 5 people have back pain.The cause of low back pain is rarely dangerous. The pain often gets better over time.About half of people with a sudden onset of back pain feel better in just 2 weeks. About 8 in 10 people feel better by 6 weeks.  CAUSES Some common causes of back pain include:  Strain of the muscles or ligaments supporting the spine.  Wear and tear (degeneration) of the spinal discs.  Arthritis.  Direct injury to the back. DIAGNOSIS Most of the time, the direct cause of low back pain is not known.However, back pain can be treated effectively even when the exact cause of the pain is unknown.Answering your caregiver's questions about your overall health and symptoms is one of the most accurate ways to make sure the cause of your pain is not dangerous. If your caregiver needs more information, he or she may order lab work or imaging tests (X-rays or MRIs).However, even if imaging tests show changes in your back, this usually does not require surgery. HOME CARE INSTRUCTIONS For many people, back pain returns.Since low back pain is rarely dangerous, it is often a condition that people can learn to manageon their own.   Remain active. It is stressful on the back to sit or stand in one place. Do not sit, drive, or stand in one place for more than 30 minutes at a time. Take short walks on level surfaces as soon as pain allows.Try to increase the length of time you walk each day.  Do not stay in bed.Resting more than 1 or 2 days can delay your recovery.  Do not avoid exercise or work.Your body is made to move.It is not dangerous to be active, even though your back may hurt.Your back will likely heal faster if you return to being active before your pain is gone.  Pay attention to your body when you bend and lift. Many people have less discomfortwhen lifting if they bend their knees, keep the load close to their bodies,and  avoid twisting. Often, the most comfortable positions are those that put less stress on your recovering back.  Find a comfortable position to sleep. Use a firm mattress and lie on your side with your knees slightly bent. If you lie on your back, put a pillow under your knees.  Only take over-the-counter or prescription medicines as directed by your caregiver. Over-the-counter medicines to reduce pain and inflammation are often the most helpful.Your caregiver may prescribe muscle relaxant drugs.These medicines help dull your pain so you can more quickly return to your normal activities and healthy exercise.  Put ice on the injured area.  Put ice in a plastic bag.  Place a towel between your skin and the bag.  Leave the ice on for 15-20 minutes, 03-04 times a day for the first 2 to 3 days. After that, ice and heat may be alternated to reduce pain and spasms.  Ask your caregiver about trying back exercises and gentle massage. This may be of some benefit.  Avoid feeling anxious or stressed.Stress increases muscle tension and can worsen back pain.It is important to recognize when you are anxious or stressed and learn ways to manage it.Exercise is a great option. SEEK MEDICAL CARE IF:  You have pain that is not relieved with rest or medicine.  You have pain that does not improve in 1 week.  You have new symptoms.  You are generally not feeling well. SEEK   IMMEDIATE MEDICAL CARE IF:   You have pain that radiates from your back into your legs.  You develop new bowel or bladder control problems.  You have unusual weakness or numbness in your arms or legs.  You develop nausea or vomiting.  You develop abdominal pain.  You feel faint. Document Released: 10/27/2005 Document Revised: 04/27/2012 Document Reviewed: 02/28/2014 [employer] Patient Information 2015 [employer]. This information is not intended to replace advice given to you by your health care provider. Make sure you  discuss any questions you have with your health care provider.  

## 2014-09-11 NOTE — Progress Notes (Signed)
Subjective:    Patient ID: Michael Valenzuela, male    DOB: 1956-07-13, 58 y.o.   MRN: [id number]  Patient here today for follow up of chronic medical problems.  Hypertension This is a chronic problem. The current episode started more than 1 year ago. The problem has been gradually improving since onset. The problem is controlled. Pertinent negatives include no chest pain, headaches, palpitations or shortness of breath. Risk factors for coronary artery disease include male gender, smoking/tobacco exposure and dyslipidemia. Past treatments include diuretics. The current treatment provides moderate improvement.  Hyperlipidemia This is a chronic problem. The current episode started more than 1 year ago. The problem is controlled. Recent lipid tests were reviewed and are high. Factors aggravating his hyperlipidemia include smoking. Pertinent negatives include no chest pain or shortness of breath. The current treatment provides mild improvement of lipids. Risk factors for coronary artery disease include male sex, hypertension and dyslipidemia.  Chronic Low back pain- Dr. Lennox Grumbles pain management- has recently had 3 injections in back and neck which has helped some. Currently roxicodone QID. Patient has been seen out  In public on several occasions without limping like he does when he is in the office. depression Refuses treatment even though he scored high on depression screen.  Review of Systems  Constitutional: Negative.   HENT: Negative.   Respiratory: Negative for shortness of breath.   Cardiovascular: Negative for chest pain and palpitations.  Musculoskeletal: Positive for back pain and arthralgias.  Neurological: Negative for headaches.  Psychiatric/Behavioral: Negative.   All other systems reviewed and are negative.      Objective:   Physical Exam  Constitutional: He is oriented to person, place, and time. He appears well-developed and well-nourished.  HENT:  Head: Normocephalic.  Eyes:  Conjunctivae are normal. Pupils are equal, round, and reactive to light.  Neck: Normal range of motion.  Cardiovascular: Normal rate and regular rhythm.   Pulmonary/Chest: Effort normal and breath sounds normal.  Abdominal: Soft.  Musculoskeletal: He exhibits tenderness (left antecubetal tenderness and bil leg burning sensassion. ).  Neurological: He is alert and oriented to person, place, and time. He has normal reflexes.  Skin: Skin is warm.  Psychiatric: He has a normal mood and affect. His behavior is normal. Judgment and thought content normal.   BP 140/93 mmHg  Pulse 72  Temp(Src) 99 F (37.2 C) (Oral)  Ht 5' 11"  (1.803 m)  Wt 217 lb 12.8 oz (98.793 kg)  BMI 30.39 kg/m2         Assessment & Plan:   1. Fibromyalgia Exercise will help keep muscle warm  2. Chronic low back pain Keep follow up at pain clinic  3. Essential hypertension, benign Low NA+ diet - CMP14+EGFR - losartan (COZAAR) 100 MG tablet; Take 1 tablet (100 mg total) by mouth daily.  Dispense: 90 tablet; Refill: 1 - hydrochlorothiazide (HYDRODIURIL) 25 MG tablet; Take 1 tablet (25 mg total) by mouth daily.  Dispense: 90 tablet; Refill: 1 - felodipine (PLENDIL) 5 MG 24 hr tablet; Take 1 tablet (5 mg total) by mouth daily.  Dispense: 90 tablet; Refill: 1  4. Hyperlipidemia with target LDL less than 100 Low fat diet - NMR, lipoprofile - rosuvastatin (CRESTOR) 10 MG tablet; Take 1 tablet (10 mg total) by mouth daily.  Dispense: 90 tablet; Refill: 1 - fenofibrate 160 MG tablet; Take 1 tablet (160 mg total) by mouth daily.  Dispense: 90 tablet; Refill: 1  5. Vitamin D deficiency    Refuses immunizations Labs  pending Health maintenance reviewed Diet and exercise encouraged Continue all meds Follow up  In 3 months   [place], FNP

## 2014-09-12 ENCOUNTER — Other Ambulatory Visit: Payer: Self-pay | Admitting: Nurse Practitioner

## 2014-09-12 DIAGNOSIS — E1169 Type 2 diabetes mellitus with other specified complication: Secondary | ICD-10-CM | POA: Insufficient documentation

## 2014-09-12 DIAGNOSIS — E119 Type 2 diabetes mellitus without complications: Secondary | ICD-10-CM | POA: Insufficient documentation

## 2014-09-12 LAB — NMR, LIPOPROFILE
Cholesterol: 142 mg/dL (ref 100–199)
HDL Cholesterol by NMR: 31 mg/dL — ABNORMAL LOW (ref >39)
HDL Particle Number: 32.8 umol/L (ref >=30.5)
LDL Particle Number: 744 nmol/L (ref <1000)
LDL Size: 19.6 nm (ref >20.5)
LDL-C: 56 mg/dL (ref 0–99)
LP-IR Score: 62 — ABNORMAL HIGH (ref <=45)
Small LDL Particle Number: 588 nmol/L — ABNORMAL HIGH (ref <=527)
Triglycerides by NMR: 276 mg/dL — ABNORMAL HIGH (ref 0–149)

## 2014-09-12 LAB — CMP14+EGFR
ALT: 66 IU/L — ABNORMAL HIGH (ref 0–44)
AST: 77 IU/L — ABNORMAL HIGH (ref 0–40)
Albumin/Globulin Ratio: 2.2 (ref 1.1–2.5)
Albumin: 4.8 g/dL (ref 3.5–5.5)
Alkaline Phosphatase: 77 IU/L (ref 39–117)
BUN/Creatinine Ratio: 14 (ref 9–20)
BUN: 12 mg/dL (ref 6–24)
CO2: 25 mmol/L (ref 18–29)
Calcium: 10.1 mg/dL (ref 8.7–10.2)
Chloride: 99 mmol/L (ref 97–108)
Creatinine, Ser: 0.83 mg/dL (ref 0.76–1.27)
GFR calc Af Amer: 111 mL/min/{1.73_m2} (ref >59)
GFR calc non Af Amer: 96 mL/min/{1.73_m2} (ref >59)
Globulin, Total: 2.2 g/dL (ref 1.5–4.5)
Glucose: 153 mg/dL — ABNORMAL HIGH (ref 65–99)
Potassium: 4.5 mmol/L (ref 3.5–5.2)
Sodium: 139 mmol/L (ref 134–144)
Total Bilirubin: 0.3 mg/dL (ref 0.0–1.2)
Total Protein: 7 g/dL (ref 6.0–8.5)

## 2014-09-12 LAB — POCT GLYCOSYLATED HEMOGLOBIN (HGB A1C): Hemoglobin A1C: 7

## 2014-09-12 MED ORDER — METFORMIN HCL ER 500 MG PO TB24: 500.0000 mg | ORAL_TABLET | Freq: Every day | ORAL | Status: DC

## 2014-09-12 NOTE — Addendum Note (Signed)
Addended by: Selmer Dominion on: 09/12/2014 10:14 AM   Modules accepted: Orders

## 2014-09-18 ENCOUNTER — Encounter: Payer: Self-pay | Admitting: Pharmacist

## 2014-09-18 ENCOUNTER — Ambulatory Visit (INDEPENDENT_AMBULATORY_CARE_PROVIDER_SITE_OTHER): Payer: Medicare Other | Admitting: Pharmacist

## 2014-09-18 VITALS — BP 136/80 | HR 78 | Ht 71.0 in | Wt 214.5 lb

## 2014-09-18 DIAGNOSIS — G8929 Other chronic pain: Secondary | ICD-10-CM | POA: Diagnosis not present

## 2014-09-18 DIAGNOSIS — F329 Major depressive disorder, single episode, unspecified: Secondary | ICD-10-CM | POA: Diagnosis not present

## 2014-09-18 DIAGNOSIS — M5412 Radiculopathy, cervical region: Secondary | ICD-10-CM | POA: Diagnosis not present

## 2014-09-18 DIAGNOSIS — M545 Low back pain: Secondary | ICD-10-CM | POA: Diagnosis not present

## 2014-09-18 DIAGNOSIS — G890 Central pain syndrome: Secondary | ICD-10-CM | POA: Diagnosis not present

## 2014-09-18 DIAGNOSIS — R202 Paresthesia of skin: Secondary | ICD-10-CM | POA: Diagnosis not present

## 2014-09-18 DIAGNOSIS — M797 Fibromyalgia: Secondary | ICD-10-CM | POA: Diagnosis not present

## 2014-09-18 DIAGNOSIS — I10 Essential (primary) hypertension: Secondary | ICD-10-CM

## 2014-09-18 DIAGNOSIS — E784 Other hyperlipidemia: Secondary | ICD-10-CM | POA: Diagnosis not present

## 2014-09-18 DIAGNOSIS — G540 Brachial plexus disorders: Secondary | ICD-10-CM | POA: Diagnosis not present

## 2014-09-18 DIAGNOSIS — M5136 Other intervertebral disc degeneration, lumbar region: Secondary | ICD-10-CM | POA: Diagnosis not present

## 2014-09-18 DIAGNOSIS — E119 Type 2 diabetes mellitus without complications: Secondary | ICD-10-CM | POA: Diagnosis not present

## 2014-09-18 DIAGNOSIS — F0631 Mood disorder due to known physiological condition with depressive features: Secondary | ICD-10-CM

## 2014-09-18 DIAGNOSIS — E785 Hyperlipidemia, unspecified: Secondary | ICD-10-CM

## 2014-09-18 MED ORDER — ASPIRIN EC 81 MG PO TBEC: 81.0000 mg | DELAYED_RELEASE_TABLET | Freq: Every day | ORAL | Status: DC

## 2014-09-18 NOTE — Progress Notes (Signed)
Patient ID: Michael Valenzuela, male   DOB: 1956/05/24, 58 y.o.   MRN: [id number] Subjective:    Michael Valenzuela is a 58 y.o. male who presents for an initial evaluation of Type 2 diabetes mellitus.  Current symptoms/problems include hyperglycemia and occassional headache and have been unchanged. Symptoms have been present for 1 months.  Patient's son has type 1 DM and the patient feels that he is pretty knowledgeable about DM.  The patient was initially diagnosed with Type 2 diabetes 09/11/2014 with and A1c of 7.0%  Known diabetic complications: none Cardiovascular risk factors: advanced age (older than 54 for men, 51 for women), diabetes mellitus, dyslipidemia, family history of premature cardiovascular disease, hypertension, male gender, obesity (BMI >= 30 kg/m2), sedentary lifestyle and smoking/ tobacco exposure Current diabetic medications include oral agent (monotherapy): Glucophage XR 500mg  1 tablet daily.  Eye exam current (within one year): no Weight trend: decreasing steadily Prior visit with dietician:  Per patient he has listened to several diet classes because his son has type 1 DM Current diet: in general, an "unhealthy" diet Current exercise: none  Current monitoring regimen: none Home blood sugar records: none Any episodes of hypoglycemia? no  Is He on ACE inhibitor or angiotensin II receptor blocker?  Yes  - losartan (Cozaar) 100mg  once daily    Patient was given PHQ-9 questionnaire at check-in and score very high.  He states "dont be alarmed by these answers because it is all related to my fibromyalgia"  I reviewed questionnaire and answers entered into e chart.  Patient states he has tried lyrica and cymbalta in the past and they made him feel "bad".  When I asked him if he could tell me more he said he just did not feel good and was more fatigued when he took them.  Objective:    BP 136/80 mmHg  Pulse 78  Ht 5\' 11"  (1.803 m)  Wt 214 lb 8 oz (97.297 kg)  BMI 29.93 kg/m2   Lab  Review GLUCOSE (mg/dL)  Date Value  09/11/2014 153*  06/09/2014 140*  03/08/2014 113*   GLUCOSE, BLD (mg/dL)  Date Value  01/28/2013 99   CO2 (mmol/L)  Date Value  09/11/2014 25  06/09/2014 26  03/08/2014 25   BUN (mg/dL)  Date Value  09/11/2014 12  06/09/2014 18  03/08/2014 18  01/28/2013 21   CREATININE (mg/dL)  Date Value  07/31/2012 1.0   CREAT (mg/dL)  Date Value  01/28/2013 1.00   CREATININE, SER (mg/dL)  Date Value  09/11/2014 0.83  06/09/2014 0.95  03/08/2014 0.98   A1c = 7.0% (07/12/2014) LDL - P =  744 HDL = 31 Tg = 276  Assessment:    Diabetes Mellitus type II, under inadequate control Dyslipidemia  HTN Fibromyalgia with underlying depression.    Plan:    1.  Rx changes: continue metformin XR 500mg  1 tablet daily.  Start ASA 81mg  1 tablet daily for stroke prevention. 2.  Education: Reviewed 'ABCs' of diabetes management (respective goals in parentheses):  A1C (<6.5%), blood pressure (<140/80), and cholesterol (LDL <100).  Reminded to get yearly eye exam. 3.  Discussed checking BG 3-4 times weekly and discussed BG goals.  Offered patient prescription for glucometer and strips but he states he has plenty of strips and a glucometer at home.  4.  Discussed fibromyalgia and underlying depression.  Patient is seeing pain specialist.  Suggested a trial of Savella and patient declined.  He also declined any medications for depression and referral to  counselor or psychiatrist 5.  Discussed CHO counting diet in depth with patient - recommended 50 to 60 g of CHO per meal and 15 to 20g per snack.  Encouraged to increase non starchy vegetables and whole grains in diet while adhering to recommended sreving sizes.  I especially encouraged him to check the CHO content of the green tea that he drinks.  It might have a lot of CHO in which case he should find alternative such as a diegreen tea.  6.  Exercise is difficult for patient but I did recommend increase  movement - 5 to 10 minutes several times per day. 7.  Discussed smoking cessation - patient has been trying to decrease the number of cigarettes he smokes daily and he has a plan to ween off cigarettes.  I offered support and let him know if her needed nicotine replacement therapy we could off that - he declined   8.  Patient continues to refuse influenza and pneumonia vaccines 9.  RTC in 3 months to see PCP;  Follow up as needed for further diabetes education  Cherre Robins, PharmD, CPP, CDE

## 2014-09-18 NOTE — Patient Instructions (Signed)
Diabetes and Standards of Medical Care   Diabetes is complicated. You may find that your diabetes team includes a dietitian, nurse, diabetes educator, eye doctor, and more. To help everyone know what is going on and to help you get the care you deserve, the following schedule of care was developed to help keep you on track. Below are the tests, exams, vaccines, medicines, education, and plans you will need.  Blood Glucose Goals Prior to meals = 80 - 130 Within 2 hours of the start of a meal = less than 180  HbA1c test (goal is less than 6.5% - your last value was 7.0%) This test shows how well you have controlled your glucose over the past 2 to 3 months. It is used to see if your diabetes management plan needs to be adjusted.   It is performed at least 2 times a year if you are meeting treatment goals.  It is performed 4 times a year if therapy has changed or if you are not meeting treatment goals.  Blood pressure test  This test is performed at every routine medical visit. The goal is less than 140/90 mmHg for most people, but 130/80 mmHg in some cases. Ask your health care provider about your goal.  Dental exam  Follow up with the dentist regularly.  Eye exam  If you are diagnosed with type 1 diabetes as a child, get an exam upon reaching the age of 64 years or older and have had diabetes for 3 to 5 years. Yearly eye exams are recommended after that initial eye exam.  If you are diagnosed with type 1 diabetes as an adult, get an exam within 5 years of diagnosis and then yearly.  If you are diagnosed with type 2 diabetes, get an exam as soon as possible after the diagnosis and then yearly.  Foot care exam  Visual foot exams are performed at every routine medical visit. The exams check for cuts, injuries, or other problems with the feet.  A comprehensive foot exam should be done yearly. This includes visual inspection as well as assessing foot pulses and testing for loss of  sensation.  Check your feet nightly for cuts, injuries, or other problems with your feet. Tell your health care provider if anything is not healing.  Kidney function test (urine microalbumin)  This test is performed once a year.  Type 1 diabetes: The first test is performed 5 years after diagnosis.  Type 2 diabetes: The first test is performed at the time of diagnosis.  A serum creatinine and estimated glomerular filtration rate (eGFR) test is done once a year to assess the level of chronic kidney disease (CKD), if present.  Lipid profile (cholesterol, HDL, LDL, triglycerides)  Performed every 5 years for most people.  The goal for LDL is less than 100 mg/dL. If you are at high risk, the goal is less than 70 mg/dL.  The goal for HDL is 40 mg/dL to 50 mg/dL for men and 50 mg/dL to 60 mg/dL for women. An HDL cholesterol of 60 mg/dL or higher gives some protection against heart disease.  The goal for triglycerides is less than 150 mg/dL.  Discontinue smoking is you are a smoker  Influenza vaccine, pneumococcal vaccine, and hepatitis B vaccine  The influenza vaccine is recommended yearly.  The pneumococcal vaccine is generally given once in a lifetime. However, there are some instances when another vaccination is recommended. Check with your health care provider.  The hepatitis B vaccine  is also recommended for adults with diabetes.  Diabetes self-management education  Education is recommended at diagnosis and ongoing as needed.  Treatment plan  Your treatment plan is reviewed at every medical visit.  Start Aspirin 49m daily for stroke prevention

## 2014-10-18 DIAGNOSIS — M2550 Pain in unspecified joint: Secondary | ICD-10-CM | POA: Diagnosis not present

## 2014-10-18 DIAGNOSIS — M545 Low back pain: Secondary | ICD-10-CM | POA: Diagnosis not present

## 2014-10-18 DIAGNOSIS — M6289 Other specified disorders of muscle: Secondary | ICD-10-CM | POA: Diagnosis not present

## 2014-10-18 DIAGNOSIS — M609 Myositis, unspecified: Secondary | ICD-10-CM | POA: Diagnosis not present

## 2014-10-18 DIAGNOSIS — Z79891 Long term (current) use of opiate analgesic: Secondary | ICD-10-CM | POA: Diagnosis not present

## 2014-10-18 DIAGNOSIS — M25552 Pain in left hip: Secondary | ICD-10-CM | POA: Diagnosis not present

## 2014-10-18 DIAGNOSIS — M799 Soft tissue disorder, unspecified: Secondary | ICD-10-CM | POA: Diagnosis not present

## 2014-10-18 DIAGNOSIS — G8929 Other chronic pain: Secondary | ICD-10-CM | POA: Diagnosis not present

## 2014-10-24 ENCOUNTER — Telehealth: Payer: Self-pay | Admitting: Nurse Practitioner

## 2014-10-24 DIAGNOSIS — E119 Type 2 diabetes mellitus without complications: Secondary | ICD-10-CM

## 2014-10-24 MED ORDER — GLUCOSE BLOOD VI STRP: ORAL_STRIP | Status: DC

## 2014-10-24 MED ORDER — ACCU-CHEK SOFTCLIX LANCET DEV MISC: Status: DC

## 2014-10-24 NOTE — Telephone Encounter (Signed)
Patient aware new script sent in for diabetic machine and supplies.

## 2014-10-26 DIAGNOSIS — M6281 Muscle weakness (generalized): Secondary | ICD-10-CM | POA: Diagnosis not present

## 2014-10-26 DIAGNOSIS — M6289 Other specified disorders of muscle: Secondary | ICD-10-CM | POA: Diagnosis not present

## 2014-10-26 DIAGNOSIS — M609 Myositis, unspecified: Secondary | ICD-10-CM | POA: Diagnosis not present

## 2014-10-26 DIAGNOSIS — M799 Soft tissue disorder, unspecified: Secondary | ICD-10-CM | POA: Diagnosis not present

## 2014-10-26 DIAGNOSIS — M545 Low back pain: Secondary | ICD-10-CM | POA: Diagnosis not present

## 2014-10-26 DIAGNOSIS — M6240 Contracture of muscle, unspecified site: Secondary | ICD-10-CM | POA: Diagnosis not present

## 2014-10-27 ENCOUNTER — Other Ambulatory Visit: Payer: Self-pay | Admitting: *Deleted

## 2014-10-27 MED ORDER — GLUCOSE BLOOD VI STRP: ORAL_STRIP | Status: DC

## 2014-11-15 DIAGNOSIS — M545 Low back pain: Secondary | ICD-10-CM | POA: Diagnosis not present

## 2014-11-15 DIAGNOSIS — M79651 Pain in right thigh: Secondary | ICD-10-CM | POA: Diagnosis not present

## 2014-11-15 DIAGNOSIS — M79652 Pain in left thigh: Secondary | ICD-10-CM | POA: Diagnosis not present

## 2014-11-15 DIAGNOSIS — G545 Neuralgic amyotrophy: Secondary | ICD-10-CM | POA: Diagnosis not present

## 2014-11-15 DIAGNOSIS — G8929 Other chronic pain: Secondary | ICD-10-CM | POA: Diagnosis not present

## 2014-11-17 ENCOUNTER — Encounter: Payer: Self-pay | Admitting: *Deleted

## 2014-11-24 ENCOUNTER — Other Ambulatory Visit: Payer: Self-pay | Admitting: *Deleted

## 2014-12-13 ENCOUNTER — Ambulatory Visit: Payer: Medicare Other | Admitting: Nurse Practitioner

## 2014-12-14 ENCOUNTER — Ambulatory Visit (INDEPENDENT_AMBULATORY_CARE_PROVIDER_SITE_OTHER): Payer: Medicare Other | Admitting: Nurse Practitioner

## 2014-12-14 ENCOUNTER — Encounter: Payer: Self-pay | Admitting: Nurse Practitioner

## 2014-12-14 VITALS — BP 137/91 | HR 79 | Temp 97.3°F | Ht 71.0 in | Wt 210.0 lb

## 2014-12-14 DIAGNOSIS — E119 Type 2 diabetes mellitus without complications: Secondary | ICD-10-CM

## 2014-12-14 DIAGNOSIS — M797 Fibromyalgia: Secondary | ICD-10-CM | POA: Diagnosis not present

## 2014-12-14 DIAGNOSIS — G8929 Other chronic pain: Secondary | ICD-10-CM | POA: Diagnosis not present

## 2014-12-14 DIAGNOSIS — I10 Essential (primary) hypertension: Secondary | ICD-10-CM

## 2014-12-14 DIAGNOSIS — M545 Low back pain: Secondary | ICD-10-CM

## 2014-12-14 DIAGNOSIS — E785 Hyperlipidemia, unspecified: Secondary | ICD-10-CM | POA: Diagnosis not present

## 2014-12-14 LAB — POCT GLYCOSYLATED HEMOGLOBIN (HGB A1C): Hemoglobin A1C: 6.2

## 2014-12-14 NOTE — Progress Notes (Signed)
   Subjective:    Patient ID: Michael Valenzuela, male    DOB: 01-22-1956, 59 y.o.   MRN: [id number]  Patient here today for follow up of chronic medical problems.  Hypertension This is a chronic problem. The current episode started more than 1 year ago. The problem has been gradually improving since onset. The problem is controlled. Pertinent negatives include no chest pain, headaches, palpitations or shortness of breath. Risk factors for coronary artery disease include male gender, smoking/tobacco exposure and dyslipidemia. Past treatments include diuretics. The current treatment provides moderate improvement.  Hyperlipidemia This is a chronic problem. The current episode started more than 1 year ago. The problem is controlled. Recent lipid tests were reviewed and are high. Factors aggravating his hyperlipidemia include smoking. Pertinent negatives include no chest pain or shortness of breath. The current treatment provides mild improvement of lipids. Risk factors for coronary artery disease include male sex, hypertension and dyslipidemia.  Chronic Low back pain- Dr. Lennox Grumbles pain management- has recently had 3 injections in back and neck which has helped some. Currently roxicodone QID. Patient has been seen out  In public on several occasions without limping like he does when he is in the office. depression Refuses treatment even though he scored high on depression screen.  Review of Systems  Constitutional: Negative.   HENT: Negative.   Respiratory: Negative for shortness of breath.   Cardiovascular: Negative for chest pain and palpitations.  Musculoskeletal: Positive for back pain and arthralgias.  Neurological: Negative for headaches.  Psychiatric/Behavioral: Negative.   All other systems reviewed and are negative.      Objective:   Physical Exam  Constitutional: He is oriented to person, place, and time. He appears well-developed and well-nourished.  HENT:  Head: Normocephalic.  Eyes:  Conjunctivae are normal. Pupils are equal, round, and reactive to light.  Neck: Normal range of motion.  Cardiovascular: Normal rate and regular rhythm.   Pulmonary/Chest: Effort normal and breath sounds normal.  Abdominal: Soft.  Musculoskeletal: He exhibits tenderness (left antecubetal tenderness and bil leg burning sensation).  Neurological: He is alert and oriented to person, place, and time. He has normal reflexes.  Skin: Skin is warm.  Psychiatric: He has a normal mood and affect. His behavior is normal. Judgment and thought content normal.   BP 137/91 mmHg  Pulse 79  Temp(Src) 97.3 F (36.3 C) (Oral)  Ht 5\' 11"  (1.803 m)  Wt 210 lb (95.255 kg)  BMI 29.30 kg/m2   Results for orders placed or performed in visit on 12/14/14  POCT glycosylated hemoglobin (Hb A1C)  Result Value Ref Range   Hemoglobin A1C 6.2          Assessment & Plan:   1. Diabetes mellitus without complication Continue carb counting - POCT glycosylated hemoglobin (Hb A1C)  2. Essential hypertension, benign Do not add salt to diet  3. Fibromyalgia Exercise will help with pain  4. Hyperlipidemia with target LDL less than 100 Low fat diet  5. Chronic low back pain Continue at pain clinic   Refuses adult immunizations Labs pending Health maintenance reviewed Diet and exercise encouraged Continue all meds Follow up  In 3 month   [place], FNP

## 2014-12-14 NOTE — Patient Instructions (Signed)
Diabetes and Foot Care Diabetes may cause you to have problems because of poor blood supply (circulation) to your feet and legs. This may cause the skin on your feet to become thinner, break easier, and heal more slowly. Your skin may become dry, and the skin may peel and crack. You may also have nerve damage in your legs and feet causing decreased feeling in them. You may not notice minor injuries to your feet that could lead to infections or more serious problems. Taking care of your feet is one of the most important things you can do for yourself.  HOME CARE INSTRUCTIONS  Wear shoes at all times, even in the house. Do not go barefoot. Bare feet are easily injured.  Check your feet daily for blisters, cuts, and redness. If you cannot see the bottom of your feet, use a mirror or ask someone for help.  Wash your feet with warm water (do not use hot water) and mild soap. Then pat your feet and the areas between your toes until they are completely dry. Do not soak your feet as this can dry your skin.  Apply a moisturizing lotion or petroleum jelly (that does not contain alcohol and is unscented) to the skin on your feet and to dry, brittle toenails. Do not apply lotion between your toes.  Trim your toenails straight across. Do not dig under them or around the cuticle. File the edges of your nails with an emery board or nail file.  Do not cut corns or calluses or try to remove them with medicine.  Wear clean socks or stockings every day. Make sure they are not too tight. Do not wear knee-high stockings since they may decrease blood flow to your legs.  Wear shoes that fit properly and have enough cushioning. To break in new shoes, wear them for just a few hours a day. This prevents you from injuring your feet. Always look in your shoes before you put them on to be sure there are no objects inside.  Do not cross your legs. This may decrease the blood flow to your feet.  If you find a minor scrape,  cut, or break in the skin on your feet, keep it and the skin around it clean and dry. These areas may be cleansed with mild soap and water. Do not cleanse the area with peroxide, alcohol, or iodine.  When you remove an adhesive bandage, be sure not to damage the skin around it.  If you have a wound, look at it several times a day to make sure it is healing.  Do not use heating pads or hot water bottles. They may burn your skin. If you have lost feeling in your feet or legs, you may not know it is happening until it is too late.  Make sure your health care provider performs a complete foot exam at least annually or more often if you have foot problems. Report any cuts, sores, or bruises to your health care provider immediately. SEEK MEDICAL CARE IF:   You have an injury that is not healing.  You have cuts or breaks in the skin.  You have an ingrown nail.  You notice redness on your legs or feet.  You feel burning or tingling in your legs or feet.  You have pain or cramps in your legs and feet.  Your legs or feet are numb.  Your feet always feel cold. SEEK IMMEDIATE MEDICAL CARE IF:   There is increasing redness,   swelling, or pain in or around a wound.  There is a red line that goes up your leg.  Pus is coming from a wound.  You develop a fever or as directed by your health care provider.  You notice a bad smell coming from an ulcer or wound. Document Released: 10/24/2000 Document Revised: 06/29/2013 Document Reviewed: 04/05/2013 Exit[employer] Patient Information 2015 [employer]. This information is not intended to replace advice given to you by your health care provider. Make sure you discuss any questions you have with your health care provider.  

## 2014-12-27 DIAGNOSIS — M542 Cervicalgia: Secondary | ICD-10-CM | POA: Diagnosis not present

## 2014-12-27 DIAGNOSIS — G8929 Other chronic pain: Secondary | ICD-10-CM | POA: Diagnosis not present

## 2014-12-27 DIAGNOSIS — M797 Fibromyalgia: Secondary | ICD-10-CM | POA: Diagnosis not present

## 2014-12-27 DIAGNOSIS — M545 Low back pain: Secondary | ICD-10-CM | POA: Diagnosis not present

## 2015-01-22 DIAGNOSIS — G8929 Other chronic pain: Secondary | ICD-10-CM | POA: Diagnosis not present

## 2015-01-22 DIAGNOSIS — M797 Fibromyalgia: Secondary | ICD-10-CM | POA: Diagnosis not present

## 2015-01-22 DIAGNOSIS — M545 Low back pain: Secondary | ICD-10-CM | POA: Diagnosis not present

## 2015-01-22 DIAGNOSIS — M542 Cervicalgia: Secondary | ICD-10-CM | POA: Diagnosis not present

## 2015-02-21 DIAGNOSIS — M545 Low back pain: Secondary | ICD-10-CM | POA: Diagnosis not present

## 2015-02-21 DIAGNOSIS — G8929 Other chronic pain: Secondary | ICD-10-CM | POA: Diagnosis not present

## 2015-02-21 DIAGNOSIS — M79651 Pain in right thigh: Secondary | ICD-10-CM | POA: Diagnosis not present

## 2015-02-21 DIAGNOSIS — M797 Fibromyalgia: Secondary | ICD-10-CM | POA: Diagnosis not present

## 2015-02-21 DIAGNOSIS — M79652 Pain in left thigh: Secondary | ICD-10-CM | POA: Diagnosis not present

## 2015-02-21 DIAGNOSIS — M542 Cervicalgia: Secondary | ICD-10-CM | POA: Diagnosis not present

## 2015-03-19 ENCOUNTER — Ambulatory Visit (INDEPENDENT_AMBULATORY_CARE_PROVIDER_SITE_OTHER): Payer: Medicare Other | Admitting: Nurse Practitioner

## 2015-03-19 ENCOUNTER — Encounter: Payer: Self-pay | Admitting: Nurse Practitioner

## 2015-03-19 VITALS — BP 142/92 | HR 67 | Temp 97.4°F | Ht 71.0 in | Wt 216.0 lb

## 2015-03-19 DIAGNOSIS — I10 Essential (primary) hypertension: Secondary | ICD-10-CM

## 2015-03-19 DIAGNOSIS — M545 Low back pain: Secondary | ICD-10-CM

## 2015-03-19 DIAGNOSIS — Z23 Encounter for immunization: Secondary | ICD-10-CM

## 2015-03-19 DIAGNOSIS — M797 Fibromyalgia: Secondary | ICD-10-CM

## 2015-03-19 DIAGNOSIS — E785 Hyperlipidemia, unspecified: Secondary | ICD-10-CM | POA: Diagnosis not present

## 2015-03-19 DIAGNOSIS — G8929 Other chronic pain: Secondary | ICD-10-CM | POA: Diagnosis not present

## 2015-03-19 DIAGNOSIS — E119 Type 2 diabetes mellitus without complications: Secondary | ICD-10-CM | POA: Diagnosis not present

## 2015-03-19 LAB — POCT GLYCOSYLATED HEMOGLOBIN (HGB A1C): Hemoglobin A1C: 6.3

## 2015-03-19 MED ORDER — HYDROCHLOROTHIAZIDE 25 MG PO TABS: 25.0000 mg | ORAL_TABLET | Freq: Every day | ORAL | Status: DC

## 2015-03-19 MED ORDER — METFORMIN HCL ER 500 MG PO TB24: 500.0000 mg | ORAL_TABLET | Freq: Every day | ORAL | Status: DC

## 2015-03-19 MED ORDER — LOSARTAN POTASSIUM 100 MG PO TABS: 100.0000 mg | ORAL_TABLET | Freq: Every day | ORAL | Status: DC

## 2015-03-19 MED ORDER — FELODIPINE ER 5 MG PO TB24: 5.0000 mg | ORAL_TABLET | Freq: Every day | ORAL | Status: DC

## 2015-03-19 MED ORDER — ROSUVASTATIN CALCIUM 10 MG PO TABS: 10.0000 mg | ORAL_TABLET | Freq: Every day | ORAL | Status: DC

## 2015-03-19 NOTE — Patient Instructions (Signed)
Fat and Cholesterol Control Diet Fat and cholesterol levels in your blood and organs are influenced by your diet. High levels of fat and cholesterol may lead to diseases of the heart, small and large blood vessels, gallbladder, liver, and pancreas. CONTROLLING FAT AND CHOLESTEROL WITH DIET Although exercise and lifestyle factors are important, your diet is key. That is because certain foods are known to raise cholesterol and others to lower it. The goal is to balance foods for their effect on cholesterol and more importantly, to replace saturated and trans fat with other types of fat, such as monounsaturated fat, polyunsaturated fat, and omega-3 fatty acids. On average, a person should consume no more than 15 to 17 g of saturated fat daily. Saturated and trans fats are considered "bad" fats, and they will raise LDL cholesterol. Saturated fats are primarily found in animal products such as meats, butter, and cream. However, that does not mean you need to give up all your favorite foods. Today, there are good tasting, low-fat, low-cholesterol substitutes for most of the things you like to eat. Choose low-fat or nonfat alternatives. Choose round or loin cuts of red meat. These types of cuts are lowest in fat and cholesterol. Chicken (without the skin), fish, veal, and ground turkey breast are great choices. Eliminate fatty meats, such as hot dogs and salami. Even shellfish have little or no saturated fat. Have a 3 oz (85 g) portion when you eat lean meat, poultry, or fish. Trans fats are also called "partially hydrogenated oils." They are oils that have been scientifically manipulated so that they are solid at room temperature resulting in a longer shelf life and improved taste and texture of foods in which they are added. Trans fats are found in stick margarine, some tub margarines, cookies, crackers, and baked goods.  When baking and cooking, oils are a great substitute for butter. The monounsaturated oils are  especially beneficial since it is believed they lower LDL and raise HDL. The oils you should avoid entirely are saturated tropical oils, such as coconut and palm.  Remember to eat a lot from food groups that are naturally free of saturated and trans fat, including fish, fruit, vegetables, beans, grains (barley, rice, couscous, bulgur wheat), and pasta (without cream sauces).  IDENTIFYING FOODS THAT LOWER FAT AND CHOLESTEROL  Soluble fiber may lower your cholesterol. This type of fiber is found in fruits such as apples, vegetables such as broccoli, potatoes, and carrots, legumes such as beans, peas, and lentils, and grains such as barley. Foods fortified with plant sterols (phytosterol) may also lower cholesterol. You should eat at least 2 g per day of these foods for a cholesterol lowering effect.  Read package labels to identify low-saturated fats, trans fat free, and low-fat foods at the supermarket. Select cheeses that have only 2 to 3 g saturated fat per ounce. Use a heart-healthy tub margarine that is free of trans fats or partially hydrogenated oil. When buying baked goods (cookies, crackers), avoid partially hydrogenated oils. Breads and muffins should be made from whole grains (whole-wheat or whole oat flour, instead of "flour" or "enriched flour"). Buy non-creamy canned soups with reduced salt and no added fats.  FOOD PREPARATION TECHNIQUES  Never deep-fry. If you must fry, either stir-fry, which uses very little fat, or use non-stick cooking sprays. When possible, broil, bake, or roast meats, and steam vegetables. Instead of putting butter or margarine on vegetables, use lemon and herbs, applesauce, and cinnamon (for squash and sweet potatoes). Use nonfat   yogurt, salsa, and low-fat dressings for salads.  LOW-SATURATED FAT / LOW-FAT FOOD SUBSTITUTES Meats / Saturated Fat (g)  Avoid: Steak, marbled (3 oz/85 g) / 11 g  Choose: Steak, lean (3 oz/85 g) / 4 g  Avoid: Hamburger (3 oz/85 g) / 7  g  Choose: Hamburger, lean (3 oz/85 g) / 5 g  Avoid: Ham (3 oz/85 g) / 6 g  Choose: Ham, lean cut (3 oz/85 g) / 2.4 g  Avoid: Chicken, with skin, dark meat (3 oz/85 g) / 4 g  Choose: Chicken, skin removed, dark meat (3 oz/85 g) / 2 g  Avoid: Chicken, with skin, light meat (3 oz/85 g) / 2.5 g  Choose: Chicken, skin removed, light meat (3 oz/85 g) / 1 g Dairy / Saturated Fat (g)  Avoid: Whole milk (1 cup) / 5 g  Choose: Low-fat milk, 2% (1 cup) / 3 g  Choose: Low-fat milk, 1% (1 cup) / 1.5 g  Choose: Skim milk (1 cup) / 0.3 g  Avoid: Hard cheese (1 oz/28 g) / 6 g  Choose: Skim milk cheese (1 oz/28 g) / 2 to 3 g  Avoid: Cottage cheese, 4% fat (1 cup) / 6.5 g  Choose: Low-fat cottage cheese, 1% fat (1 cup) / 1.5 g  Avoid: Ice cream (1 cup) / 9 g  Choose: Sherbet (1 cup) / 2.5 g  Choose: Nonfat frozen yogurt (1 cup) / 0.3 g  Choose: Frozen fruit bar / trace  Avoid: Whipped cream (1 tbs) / 3.5 g  Choose: Nondairy whipped topping (1 tbs) / 1 g Condiments / Saturated Fat (g)  Avoid: Mayonnaise (1 tbs) / 2 g  Choose: Low-fat mayonnaise (1 tbs) / 1 g  Avoid: Butter (1 tbs) / 7 g  Choose: Extra light margarine (1 tbs) / 1 g  Avoid: Coconut oil (1 tbs) / 11.8 g  Choose: Olive oil (1 tbs) / 1.8 g  Choose: Corn oil (1 tbs) / 1.7 g  Choose: Safflower oil (1 tbs) / 1.2 g  Choose: Sunflower oil (1 tbs) / 1.4 g  Choose: Soybean oil (1 tbs) / 2.4 g  Choose: Canola oil (1 tbs) / 1 g Document Released: 10/27/2005 Document Revised: 02/21/2013 Document Reviewed: 01/25/2014 [employer] Patient Information 2015 [employer]. This information is not intended to replace advice given to you by your health care provider. Make sure you discuss any questions you have with your health care provider.  

## 2015-03-19 NOTE — Addendum Note (Signed)
Addended by: Rolena Infante on: 03/19/2015 11:33 AM   Modules accepted: Orders

## 2015-03-19 NOTE — Progress Notes (Signed)
Subjective:    Patient ID: Michael Valenzuela, male    DOB: [birth year], 59 y.o.   MRN: [id number]  Patient here today for follow up of chronic medical problems.  Hypertension This is a chronic problem. The current episode started more than 1 year ago. The problem has been gradually improving since onset. The problem is controlled. Pertinent negatives include no chest pain, headaches, palpitations or shortness of breath. Risk factors for coronary artery disease include male gender, smoking/tobacco exposure and dyslipidemia. Past treatments include diuretics. The current treatment provides moderate improvement. Compliance problems include diet and exercise.  There is no history of CAD/MI, CVA or PVD.  Hyperlipidemia This is a chronic problem. The current episode started more than 1 year ago. The problem is controlled. Recent lipid tests were reviewed and are high. Factors aggravating his hyperlipidemia include smoking. Pertinent negatives include no chest pain or shortness of breath. The current treatment provides mild improvement of lipids. Risk factors for coronary artery disease include male sex, hypertension and dyslipidemia.  Chronic Low back pain- Dr. Lennox Grumbles pain management- has recently had 3 injections in back and neck which has helped some. Currently roxicodone QID. Patient has been seen out  In public on several occasions without limping like he does when he is in the office. depression Refuses treatment even though he scored high on depression screen.  Review of Systems  Constitutional: Negative.   HENT: Negative.   Respiratory: Negative for shortness of breath.   Cardiovascular: Negative for chest pain and palpitations.  Musculoskeletal: Positive for back pain and arthralgias.  Neurological: Negative for headaches.  Psychiatric/Behavioral: Negative.   All other systems reviewed and are negative.      Objective:   Physical Exam  Constitutional: He is oriented to person, place, and time.  He appears well-developed and well-nourished.  HENT:  Head: Normocephalic.  Eyes: Conjunctivae are normal. Pupils are equal, round, and reactive to light.  Neck: Normal range of motion.  Cardiovascular: Normal rate and regular rhythm.   Pulmonary/Chest: Effort normal and breath sounds normal.  Abdominal: Soft.  Musculoskeletal: He exhibits tenderness (left antecubetal tenderness and bil leg burning sensation).  Neurological: He is alert and oriented to person, place, and time. He has normal reflexes.  Skin: Skin is warm.  Psychiatric: He has a normal mood and affect. His behavior is normal. Judgment and thought content normal.   BP 142/92 mmHg  Pulse 67  Temp(Src) 97.4 F (36.3 C) (Oral)  Ht _0  (1.803 m)  Wt 216 lb (97.977 kg)  BMI 30.14 kg/m2   Results for orders placed or performed in visit on 03/19/15  POCT glycosylated hemoglobin (Hb A1C)  Result Value Ref Range   Hemoglobin A1C 6.3           Assessment & Plan:   1. Diabetes mellitus without complication Continue carb countig - POCT glycosylated hemoglobin (Hb A1C) - metFORMIN (GLUCOPHAGE XR) 500 MG 24 hr tablet; Take 1 tablet (500 mg total) by mouth daily with breakfast.  Dispense: 90 tablet; Refill: 1  2. Essential hypertension, benign Do not add salt to diet - CMP14+EGFR - losartan (COZAAR) 100 MG tablet; Take 1 tablet (100 mg total) by mouth daily.  Dispense: 90 tablet; Refill: 1 - hydrochlorothiazide (HYDRODIURIL) 25 MG tablet; Take 1 tablet (25 mg total) by mouth daily.  Dispense: 90 tablet; Refill: 1 - felodipine (PLENDIL) 5 MG 24 hr tablet; Take 1 tablet (5 mg total) by mouth daily.  Dispense: 90 tablet; Refill: 1  3. Hyperlipidemia with target LDL less than 100 Low fat diet - NMR, lipoprofile - rosuvastatin (CRESTOR) 10 MG tablet; Take 1 tablet (10 mg total) by mouth daily.  Dispense: 90 tablet; Refill: 1    5. Fibromyalgia  6. Chronic low back pain Continue pain management    Labs  pending Health maintenance reviewed Diet and exercise encouraged Continue all meds Follow up  In 3 month   [place], FNP

## 2015-03-20 DIAGNOSIS — H524 Presbyopia: Secondary | ICD-10-CM | POA: Diagnosis not present

## 2015-03-20 DIAGNOSIS — H5213 Myopia, bilateral: Secondary | ICD-10-CM | POA: Diagnosis not present

## 2015-03-20 DIAGNOSIS — H25093 Other age-related incipient cataract, bilateral: Secondary | ICD-10-CM | POA: Diagnosis not present

## 2015-03-20 DIAGNOSIS — H538 Other visual disturbances: Secondary | ICD-10-CM | POA: Diagnosis not present

## 2015-03-20 LAB — CMP14+EGFR
ALT: 51 IU/L — ABNORMAL HIGH (ref 0–44)
AST: 44 IU/L — ABNORMAL HIGH (ref 0–40)
Albumin/Globulin Ratio: 2 (ref 1.1–2.5)
Albumin: 4.7 g/dL (ref 3.5–5.5)
Alkaline Phosphatase: 62 IU/L (ref 39–117)
BUN/Creatinine Ratio: 21 — ABNORMAL HIGH (ref 9–20)
BUN: 19 mg/dL (ref 6–24)
Bilirubin Total: 0.4 mg/dL (ref 0.0–1.2)
CO2: 23 mmol/L (ref 18–29)
Calcium: 9.8 mg/dL (ref 8.7–10.2)
Chloride: 100 mmol/L (ref 97–108)
Creatinine, Ser: 0.89 mg/dL (ref 0.76–1.27)
GFR calc Af Amer: 108 mL/min/{1.73_m2} (ref >59)
GFR calc non Af Amer: 94 mL/min/{1.73_m2} (ref >59)
Globulin, Total: 2.4 g/dL (ref 1.5–4.5)
Glucose: 136 mg/dL — ABNORMAL HIGH (ref 65–99)
Potassium: 4.4 mmol/L (ref 3.5–5.2)
Sodium: 141 mmol/L (ref 134–144)
Total Protein: 7.1 g/dL (ref 6.0–8.5)

## 2015-03-20 LAB — NMR, LIPOPROFILE
Cholesterol: 137 mg/dL (ref 100–199)
HDL Cholesterol by NMR: 34 mg/dL — ABNORMAL LOW (ref >39)
HDL Particle Number: 34.5 umol/L (ref >=30.5)
LDL Particle Number: 1036 nmol/L — ABNORMAL HIGH (ref <1000)
LDL Size: 19.6 nm (ref >20.5)
LDL-C: 55 mg/dL (ref 0–99)
LP-IR Score: 76 — ABNORMAL HIGH (ref <=45)
Small LDL Particle Number: 871 nmol/L — ABNORMAL HIGH (ref <=527)
Triglycerides by NMR: 242 mg/dL — ABNORMAL HIGH (ref 0–149)

## 2015-03-21 DIAGNOSIS — M542 Cervicalgia: Secondary | ICD-10-CM | POA: Diagnosis not present

## 2015-03-21 DIAGNOSIS — M797 Fibromyalgia: Secondary | ICD-10-CM | POA: Diagnosis not present

## 2015-03-21 DIAGNOSIS — M25552 Pain in left hip: Secondary | ICD-10-CM | POA: Diagnosis not present

## 2015-03-21 DIAGNOSIS — M545 Low back pain: Secondary | ICD-10-CM | POA: Diagnosis not present

## 2015-03-21 DIAGNOSIS — G8929 Other chronic pain: Secondary | ICD-10-CM | POA: Diagnosis not present

## 2015-03-21 DIAGNOSIS — M79652 Pain in left thigh: Secondary | ICD-10-CM | POA: Diagnosis not present

## 2015-03-21 DIAGNOSIS — M79651 Pain in right thigh: Secondary | ICD-10-CM | POA: Diagnosis not present

## 2015-03-21 DIAGNOSIS — M25551 Pain in right hip: Secondary | ICD-10-CM | POA: Diagnosis not present

## 2015-04-13 ENCOUNTER — Telehealth: Payer: Self-pay | Admitting: Nurse Practitioner

## 2015-04-13 NOTE — Telephone Encounter (Signed)
Pt was made an appt with Tammy since MMM will be out of town

## 2015-04-13 NOTE — Telephone Encounter (Signed)
Still running about 130- 140 average.  Internet says this could be side effect.   Feels the worst after eating meals. Feels "crappy" Hot and cold spells and sleepy after meals.

## 2015-04-13 NOTE — Telephone Encounter (Signed)
Metformin should not do that - what is blood sugar running

## 2015-04-13 NOTE — Telephone Encounter (Signed)
Patient states that he is getting really sleepy and his eyes hurt bad and feels like it may be coming from the metformin.

## 2015-04-19 ENCOUNTER — Ambulatory Visit: Payer: Medicare Other

## 2015-04-19 DIAGNOSIS — G8929 Other chronic pain: Secondary | ICD-10-CM | POA: Diagnosis not present

## 2015-04-19 DIAGNOSIS — G541 Lumbosacral plexus disorders: Secondary | ICD-10-CM | POA: Diagnosis not present

## 2015-04-19 DIAGNOSIS — G603 Idiopathic progressive neuropathy: Secondary | ICD-10-CM | POA: Diagnosis not present

## 2015-04-19 DIAGNOSIS — M545 Low back pain: Secondary | ICD-10-CM | POA: Diagnosis not present

## 2015-05-23 DIAGNOSIS — M542 Cervicalgia: Secondary | ICD-10-CM | POA: Diagnosis not present

## 2015-05-23 DIAGNOSIS — G8929 Other chronic pain: Secondary | ICD-10-CM | POA: Diagnosis not present

## 2015-05-23 DIAGNOSIS — M545 Low back pain: Secondary | ICD-10-CM | POA: Diagnosis not present

## 2015-05-23 DIAGNOSIS — Z79891 Long term (current) use of opiate analgesic: Secondary | ICD-10-CM | POA: Diagnosis not present

## 2015-05-23 DIAGNOSIS — M797 Fibromyalgia: Secondary | ICD-10-CM | POA: Diagnosis not present

## 2015-06-20 DIAGNOSIS — M542 Cervicalgia: Secondary | ICD-10-CM | POA: Diagnosis not present

## 2015-06-20 DIAGNOSIS — M545 Low back pain: Secondary | ICD-10-CM | POA: Diagnosis not present

## 2015-06-20 DIAGNOSIS — M25551 Pain in right hip: Secondary | ICD-10-CM | POA: Diagnosis not present

## 2015-06-20 DIAGNOSIS — G8929 Other chronic pain: Secondary | ICD-10-CM | POA: Diagnosis not present

## 2015-06-20 DIAGNOSIS — Z79891 Long term (current) use of opiate analgesic: Secondary | ICD-10-CM | POA: Diagnosis not present

## 2015-06-20 DIAGNOSIS — M797 Fibromyalgia: Secondary | ICD-10-CM | POA: Diagnosis not present

## 2015-06-21 ENCOUNTER — Ambulatory Visit (INDEPENDENT_AMBULATORY_CARE_PROVIDER_SITE_OTHER): Payer: Medicare Other | Admitting: Nurse Practitioner

## 2015-06-21 ENCOUNTER — Encounter: Payer: Self-pay | Admitting: Nurse Practitioner

## 2015-06-21 VITALS — BP 127/88 | HR 72 | Temp 97.7°F | Ht 71.0 in | Wt 217.0 lb

## 2015-06-21 DIAGNOSIS — Z125 Encounter for screening for malignant neoplasm of prostate: Secondary | ICD-10-CM

## 2015-06-21 DIAGNOSIS — M545 Low back pain, unspecified: Secondary | ICD-10-CM

## 2015-06-21 DIAGNOSIS — E119 Type 2 diabetes mellitus without complications: Secondary | ICD-10-CM | POA: Diagnosis not present

## 2015-06-21 DIAGNOSIS — M797 Fibromyalgia: Secondary | ICD-10-CM | POA: Diagnosis not present

## 2015-06-21 DIAGNOSIS — Z1159 Encounter for screening for other viral diseases: Secondary | ICD-10-CM | POA: Diagnosis not present

## 2015-06-21 DIAGNOSIS — E559 Vitamin D deficiency, unspecified: Secondary | ICD-10-CM | POA: Diagnosis not present

## 2015-06-21 DIAGNOSIS — Z6830 Body mass index (BMI) 30.0-30.9, adult: Secondary | ICD-10-CM

## 2015-06-21 DIAGNOSIS — I10 Essential (primary) hypertension: Secondary | ICD-10-CM

## 2015-06-21 DIAGNOSIS — G8929 Other chronic pain: Secondary | ICD-10-CM

## 2015-06-21 DIAGNOSIS — E785 Hyperlipidemia, unspecified: Secondary | ICD-10-CM

## 2015-06-21 LAB — POCT UA - MICROALBUMIN: Microalbumin Ur, POC: NEGATIVE mg/L

## 2015-06-21 LAB — POCT GLYCOSYLATED HEMOGLOBIN (HGB A1C): Hemoglobin A1C: 7

## 2015-06-21 MED ORDER — FENOFIBRATE 160 MG PO TABS: 160.0000 mg | ORAL_TABLET | Freq: Every day | ORAL | Status: DC

## 2015-06-21 NOTE — Progress Notes (Signed)
Subjective:    Patient ID: Michael Valenzuela, male    DOB: 1956-03-19, 59 y.o.   MRN: [id number]  Patient here today for follow up of chronic medical problems.  Hypertension This is a chronic problem. The current episode started more than 1 year ago. The problem has been gradually improving since onset. The problem is controlled. Pertinent negatives include no chest pain, headaches, palpitations or shortness of breath. Risk factors for coronary artery disease include male gender, smoking/tobacco exposure and dyslipidemia. Past treatments include diuretics. The current treatment provides moderate improvement. Compliance problems include diet and exercise.  There is no history of CAD/MI, CVA or PVD.  Hyperlipidemia This is a chronic problem. The current episode started more than 1 year ago. The problem is controlled. Recent lipid tests were reviewed and are high. Factors aggravating his hyperlipidemia include smoking. Pertinent negatives include no chest pain or shortness of breath. The current treatment provides mild improvement of lipids. Risk factors for coronary artery disease include male sex, hypertension and dyslipidemia.  Chronic Low back pain-/fibromyalgia Dr. Lennox Grumbles pain management- has recently had 3 injections in back and neck which has helped some. Currently roxicodone QID. Patient has been seen out  In public on several occasions without limping like he does when he is in the office. He says that his pain has been a little worse the last several days because he has been moving around more. depression Refuses treatment even though he scored high on depression screen. Says that he does  Not like how the medicines make him feel.  Review of Systems  Constitutional: Negative.   HENT: Negative.   Respiratory: Negative for shortness of breath.   Cardiovascular: Negative for chest pain and palpitations.  Musculoskeletal: Positive for back pain and arthralgias.  Neurological: Negative for headaches.   Psychiatric/Behavioral: Negative.   All other systems reviewed and are negative.      Objective:   Physical Exam  Constitutional: He is oriented to person, place, and time. He appears well-developed and well-nourished.  HENT:  Head: Normocephalic.  Eyes: Conjunctivae are normal. Pupils are equal, round, and reactive to light.  Neck: Normal range of motion.  Cardiovascular: Normal rate and regular rhythm.   Pulmonary/Chest: Effort normal and breath sounds normal.  Abdominal: Soft.  Musculoskeletal: He exhibits tenderness (left antecubetal tenderness and bil leg burning sensation).  Neurological: He is alert and oriented to person, place, and time. He has normal reflexes.  Skin: Skin is warm.  Psychiatric: He has a normal mood and affect. His behavior is normal. Judgment and thought content normal.   BP 127/88 mmHg  Pulse 72  Temp(Src) 97.7 F (36.5 C) (Oral)  Ht 5' 11"  (1.803 m)  Wt 217 lb (98.431 kg)  BMI 30.28 kg/m2  Results for orders placed or performed in visit on 06/21/15  POCT glycosylated hemoglobin (Hb A1C)  Result Value Ref Range   Hemoglobin A1C 7.0   POCT UA - Microalbumin  Result Value Ref Range   Microalbumin Ur, POC negative mg/L         Assessment & Plan:   1. Essential hypertension, benign  do not add salt to diet - CMP14+EGFR  2. Hyperlipidemia with target LDL less than 100 Low fat diet - Lipid panel - fenofibrate 160 MG tablet; Take 1 tablet (160 mg total) by mouth daily.  Dispense: 90 tablet; Refill: 1  3. Type 2 diabetes mellitus without complication Continue to watch carbs - POCT glycosylated hemoglobin (Hb A1C) - POCT UA -  Microalbumin  4. Fibromyalgia Exercise encouraged  5. Vitamin D deficiency  6. Chronic low back pain Keep follow up appointment at pain clinic  7. Need for hepatitis C screening test Age appropriate testing - Hepatitis C antibody  8. Prostate cancer screening - PSA, total and free  9. BMI 30 Discussed  diet and exercise for person with BMI >25 Will recheck weight in 3-6 months   Labs pending Health maintenance reviewed Diet and exercise encouraged Continue all meds Follow up  In 3 months   [place], FNP

## 2015-06-21 NOTE — Patient Instructions (Signed)

## 2015-06-22 LAB — CMP14+EGFR
ALT: 78 IU/L — ABNORMAL HIGH (ref 0–44)
AST: 86 IU/L — ABNORMAL HIGH (ref 0–40)
Albumin/Globulin Ratio: 2 (ref 1.1–2.5)
Albumin: 4.9 g/dL — ABNORMAL HIGH (ref 3.6–4.8)
Alkaline Phosphatase: 70 IU/L (ref 39–117)
BUN/Creatinine Ratio: 13 (ref 10–22)
BUN: 12 mg/dL (ref 8–27)
Bilirubin Total: 0.4 mg/dL (ref 0.0–1.2)
CO2: 23 mmol/L (ref 18–29)
Calcium: 10.2 mg/dL (ref 8.6–10.2)
Chloride: 94 mmol/L — ABNORMAL LOW (ref 97–108)
Creatinine, Ser: 0.89 mg/dL (ref 0.76–1.27)
GFR calc Af Amer: 107 mL/min/{1.73_m2} (ref >59)
GFR calc non Af Amer: 93 mL/min/{1.73_m2} (ref >59)
Globulin, Total: 2.5 g/dL (ref 1.5–4.5)
Glucose: 136 mg/dL — ABNORMAL HIGH (ref 65–99)
Potassium: 4.1 mmol/L (ref 3.5–5.2)
Sodium: 143 mmol/L (ref 134–144)
Total Protein: 7.4 g/dL (ref 6.0–8.5)

## 2015-06-22 LAB — LIPID PANEL
Chol/HDL Ratio: 4.6 ratio units (ref 0.0–5.0)
Cholesterol, Total: 158 mg/dL (ref 100–199)
HDL: 34 mg/dL — ABNORMAL LOW (ref >39)
LDL Calculated: 59 mg/dL (ref 0–99)
Triglycerides: 326 mg/dL — ABNORMAL HIGH (ref 0–149)
VLDL Cholesterol Cal: 65 mg/dL — ABNORMAL HIGH (ref 5–40)

## 2015-06-22 LAB — HEPATITIS C ANTIBODY: Hep C Virus Ab: <0.1 s/co ratio (ref 0.0–0.9)

## 2015-06-22 LAB — PSA, TOTAL AND FREE
PSA, Free Pct: 12.7 %
PSA, Free: 0.19 ng/mL
Prostate Specific Ag, Serum: 1.5 ng/mL (ref 0.0–4.0)

## 2015-07-18 DIAGNOSIS — G8929 Other chronic pain: Secondary | ICD-10-CM | POA: Diagnosis not present

## 2015-07-18 DIAGNOSIS — M545 Low back pain: Secondary | ICD-10-CM | POA: Diagnosis not present

## 2015-08-15 DIAGNOSIS — M545 Low back pain: Secondary | ICD-10-CM | POA: Diagnosis not present

## 2015-08-15 DIAGNOSIS — M797 Fibromyalgia: Secondary | ICD-10-CM | POA: Diagnosis not present

## 2015-08-15 DIAGNOSIS — Z79891 Long term (current) use of opiate analgesic: Secondary | ICD-10-CM | POA: Diagnosis not present

## 2015-08-15 DIAGNOSIS — M542 Cervicalgia: Secondary | ICD-10-CM | POA: Diagnosis not present

## 2015-08-15 DIAGNOSIS — G8929 Other chronic pain: Secondary | ICD-10-CM | POA: Diagnosis not present

## 2015-09-19 DIAGNOSIS — F4320 Adjustment disorder, unspecified: Secondary | ICD-10-CM | POA: Diagnosis not present

## 2015-09-19 DIAGNOSIS — M545 Low back pain: Secondary | ICD-10-CM | POA: Diagnosis not present

## 2015-09-19 DIAGNOSIS — G8929 Other chronic pain: Secondary | ICD-10-CM | POA: Diagnosis not present

## 2015-09-25 ENCOUNTER — Ambulatory Visit (INDEPENDENT_AMBULATORY_CARE_PROVIDER_SITE_OTHER): Payer: Medicare Other | Admitting: Nurse Practitioner

## 2015-09-25 ENCOUNTER — Encounter: Payer: Self-pay | Admitting: Nurse Practitioner

## 2015-09-25 VITALS — BP 136/90 | HR 88 | Temp 97.5°F | Ht 71.0 in | Wt 211.6 lb

## 2015-09-25 DIAGNOSIS — E119 Type 2 diabetes mellitus without complications: Secondary | ICD-10-CM

## 2015-09-25 DIAGNOSIS — I10 Essential (primary) hypertension: Secondary | ICD-10-CM

## 2015-09-25 DIAGNOSIS — E785 Hyperlipidemia, unspecified: Secondary | ICD-10-CM

## 2015-09-25 DIAGNOSIS — Z23 Encounter for immunization: Secondary | ICD-10-CM

## 2015-09-25 LAB — POCT GLYCOSYLATED HEMOGLOBIN (HGB A1C): Hemoglobin A1C: 9.7

## 2015-09-25 MED ORDER — ROSUVASTATIN CALCIUM 10 MG PO TABS: 10.0000 mg | ORAL_TABLET | Freq: Every day | ORAL | Status: DC

## 2015-09-25 MED ORDER — FENOFIBRATE 160 MG PO TABS: 160.0000 mg | ORAL_TABLET | Freq: Every day | ORAL | Status: DC

## 2015-09-25 MED ORDER — FELODIPINE ER 5 MG PO TB24
5.0000 mg | ORAL_TABLET | Freq: Every day | ORAL | Status: DC
Start: 2015-09-25 — End: 2016-04-01

## 2015-09-25 MED ORDER — LOSARTAN POTASSIUM 100 MG PO TABS: 100.0000 mg | ORAL_TABLET | Freq: Every day | ORAL | Status: DC

## 2015-09-25 MED ORDER — HYDROCHLOROTHIAZIDE 25 MG PO TABS: 25.0000 mg | ORAL_TABLET | Freq: Every day | ORAL | Status: DC

## 2015-09-25 MED ORDER — METFORMIN HCL ER 500 MG PO TB24: ORAL_TABLET | ORAL | Status: DC

## 2015-09-25 NOTE — Progress Notes (Signed)
Subjective:    Patient ID: Michael Valenzuela, male    DOB: 12-05-1955, 59 y.o.   MRN: [id number]  Patient here today for follow up of chronic medical problems.  Hypertension This is a chronic problem. The current episode started more than 1 year ago. The problem has been gradually improving since onset. The problem is controlled. Pertinent negatives include no chest pain, headaches, palpitations or shortness of breath. Risk factors for coronary artery disease include male gender, smoking/tobacco exposure and dyslipidemia. Past treatments include diuretics. The current treatment provides moderate improvement. Compliance problems include diet and exercise.  There is no history of CAD/MI, CVA or PVD.  Hyperlipidemia This is a chronic problem. The current episode started more than 1 year ago. The problem is controlled. Recent lipid tests were reviewed and are high. Factors aggravating his hyperlipidemia include smoking. Pertinent negatives include no chest pain or shortness of breath. The current treatment provides mild improvement of lipids. Risk factors for coronary artery disease include male sex, hypertension and dyslipidemia.  Diabetes Pertinent negatives for hypoglycemia include no headaches. Pertinent negatives for diabetes include no chest pain. Pertinent negatives for diabetic complications include no CVA or PVD.  Chronic Low back pain-/fibromyalgia Dr. Lennox Grumbles pain management- has recently had 3 injections in back and neck which has helped some. Currently roxicodone QID. Patient has been seen out  In public on several occasions without limping like he does when he is in the office. He says that his pain has been a little worse the last several days because he has been moving around more. depression Refuses treatment even though he scored high on depression screen. Says that he does  Not like how the medicines make him feel.  Review of Systems  Constitutional: Negative.   HENT: Negative.     Respiratory: Negative for shortness of breath.   Cardiovascular: Negative for chest pain and palpitations.  Musculoskeletal: Positive for back pain and arthralgias.  Neurological: Negative for headaches.  Psychiatric/Behavioral: Negative.   All other systems reviewed and are negative.      Objective:   Physical Exam  Constitutional: He is oriented to person, place, and time. He appears well-developed and well-nourished.  HENT:  Head: Normocephalic.  Eyes: Conjunctivae are normal. Pupils are equal, round, and reactive to light.  Neck: Normal range of motion.  Cardiovascular: Normal rate and regular rhythm.   Pulmonary/Chest: Effort normal and breath sounds normal.  Abdominal: Soft.  Musculoskeletal: He exhibits tenderness (left antecubetal tenderness and bil leg burning sensation).  Neurological: He is alert and oriented to person, place, and time. He has normal reflexes.  Skin: Skin is warm.  Psychiatric: He has a normal mood and affect. His behavior is normal. Judgment and thought content normal.   BP 136/90 mmHg  Pulse 88  Temp(Src) 97.5 F (36.4 C) (Oral)  Ht 5' 11" (1.803 m)  Wt 211 lb 9.6 oz (95.981 kg)  BMI 29.53 kg/m2   Results for orders placed or performed in visit on 09/25/15  POCT glycosylated hemoglobin (Hb A1C)  Result Value Ref Range   Hemoglobin A1C 9.7          Assessment & Plan:   1. Essential hypertension, benign Do not add salt to diet - CMP14+EGFR - losartan (COZAAR) 100 MG tablet; Take 1 tablet (100 mg total) by mouth daily.  Dispense: 90 tablet; Refill: 1 - hydrochlorothiazide (HYDRODIURIL) 25 MG tablet; Take 1 tablet (25 mg total) by mouth daily.  Dispense: 90 tablet; Refill: 1 - felodipine (  PLENDIL) 5 MG 24 hr tablet; Take 1 tablet (5 mg total) by mouth daily.  Dispense: 90 tablet; Refill: 1  2. Hyperlipidemia with target LDL less than 100 Low fat diet - Lipid panel - rosuvastatin (CRESTOR) 10 MG tablet; Take 1 tablet (10 mg total) by  mouth daily.  Dispense: 90 tablet; Refill: 1 - fenofibrate 160 MG tablet; Take 1 tablet (160 mg total) by mouth daily.  Dispense: 90 tablet; Refill: 1  3. Type 2 diabetes mellitus without complication, without long-term current use of insulin (HCC) Stricter carb counting Increase metformin to 2 521m XR daily - POCT glycosylated hemoglobin (Hb A1C) - metFORMIN (GLUCOPHAGE XR) 500 MG 24 hr tablet; 2 tablet po qd  Dispense: 180 tablet; Refill: 1   Counseling done on all immunizations given today- zostavax Labs pending Health maintenance reviewed Diet and exercise encouraged Continue all meds Follow up  In 3 months   MNormandy FNP

## 2015-09-25 NOTE — Addendum Note (Signed)
Addended by: Thana Ates on: 09/25/2015 11:23 AM   Modules accepted: Orders

## 2015-09-25 NOTE — Patient Instructions (Signed)
Diabetes and Foot Care Diabetes may cause you to have problems because of poor blood supply (circulation) to your feet and legs. This may cause the skin on your feet to become thinner, break easier, and heal more slowly. Your skin may become dry, and the skin may peel and crack. You may also have nerve damage in your legs and feet causing decreased feeling in them. You may not notice minor injuries to your feet that could lead to infections or more serious problems. Taking care of your feet is one of the most important things you can do for yourself.  HOME CARE INSTRUCTIONS  Wear shoes at all times, even in the house. Do not go barefoot. Bare feet are easily injured.  Check your feet daily for blisters, cuts, and redness. If you cannot see the bottom of your feet, use a mirror or ask someone for help.  Wash your feet with warm water (do not use hot water) and mild soap. Then pat your feet and the areas between your toes until they are completely dry. Do not soak your feet as this can dry your skin.  Apply a moisturizing lotion or petroleum jelly (that does not contain alcohol and is unscented) to the skin on your feet and to dry, brittle toenails. Do not apply lotion between your toes.  Trim your toenails straight across. Do not dig under them or around the cuticle. File the edges of your nails with an emery board or nail file.  Do not cut corns or calluses or try to remove them with medicine.  Wear clean socks or stockings every day. Make sure they are not too tight. Do not wear knee-high stockings since they may decrease blood flow to your legs.  Wear shoes that fit properly and have enough cushioning. To break in new shoes, wear them for just a few hours a day. This prevents you from injuring your feet. Always look in your shoes before you put them on to be sure there are no objects inside.  Do not cross your legs. This may decrease the blood flow to your feet.  If you find a minor scrape,  cut, or break in the skin on your feet, keep it and the skin around it clean and dry. These areas may be cleansed with mild soap and water. Do not cleanse the area with peroxide, alcohol, or iodine.  When you remove an adhesive bandage, be sure not to damage the skin around it.  If you have a wound, look at it several times a day to make sure it is healing.  Do not use heating pads or hot water bottles. They may burn your skin. If you have lost feeling in your feet or legs, you may not know it is happening until it is too late.  Make sure your health care provider performs a complete foot exam at least annually or more often if you have foot problems. Report any cuts, sores, or bruises to your health care provider immediately. SEEK MEDICAL CARE IF:   You have an injury that is not healing.  You have cuts or breaks in the skin.  You have an ingrown nail.  You notice redness on your legs or feet.  You feel burning or tingling in your legs or feet.  You have pain or cramps in your legs and feet.  Your legs or feet are numb.  Your feet always feel cold. SEEK IMMEDIATE MEDICAL CARE IF:   There is increasing redness,   swelling, or pain in or around a wound.  There is a red line that goes up your leg.  Pus is coming from a wound.  You develop a fever or as directed by your health care provider.  You notice a bad smell coming from an ulcer or wound.   This information is not intended to replace advice given to you by your health care provider. Make sure you discuss any questions you have with your health care provider.   Document Released: 10/24/2000 Document Revised: 06/29/2013 Document Reviewed: 04/05/2013 Elsevier Interactive Patient Education 2016 [employer]  

## 2015-09-26 LAB — LIPID PANEL
Chol/HDL Ratio: 6.8 ratio units — ABNORMAL HIGH (ref 0.0–5.0)
Cholesterol, Total: 169 mg/dL (ref 100–199)
HDL: 25 mg/dL — ABNORMAL LOW (ref >39)
Triglycerides: 796 mg/dL (ref 0–149)

## 2015-09-26 LAB — CMP14+EGFR
ALT: 66 IU/L — ABNORMAL HIGH (ref 0–44)
AST: 72 IU/L — ABNORMAL HIGH (ref 0–40)
Albumin/Globulin Ratio: 2 (ref 1.1–2.5)
Albumin: 4.6 g/dL (ref 3.6–4.8)
Alkaline Phosphatase: 86 IU/L (ref 39–117)
BUN/Creatinine Ratio: 14 (ref 10–22)
BUN: 14 mg/dL (ref 8–27)
Bilirubin Total: 0.4 mg/dL (ref 0.0–1.2)
CO2: 20 mmol/L (ref 18–29)
Calcium: 10.3 mg/dL — ABNORMAL HIGH (ref 8.6–10.2)
Chloride: 95 mmol/L — ABNORMAL LOW (ref 97–106)
Creatinine, Ser: 0.99 mg/dL (ref 0.76–1.27)
GFR calc Af Amer: 95 mL/min/{1.73_m2} (ref >59)
GFR calc non Af Amer: 82 mL/min/{1.73_m2} (ref >59)
Globulin, Total: 2.3 g/dL (ref 1.5–4.5)
Glucose: 592 mg/dL (ref 65–99)
Potassium: 5.2 mmol/L (ref 3.5–5.2)
Sodium: 135 mmol/L — ABNORMAL LOW (ref 136–144)
Total Protein: 6.9 g/dL (ref 6.0–8.5)

## 2015-09-26 LAB — COMMENT

## 2015-10-18 DIAGNOSIS — G8929 Other chronic pain: Secondary | ICD-10-CM | POA: Diagnosis not present

## 2015-10-18 DIAGNOSIS — M545 Low back pain: Secondary | ICD-10-CM | POA: Diagnosis not present

## 2015-10-23 DIAGNOSIS — F4320 Adjustment disorder, unspecified: Secondary | ICD-10-CM | POA: Diagnosis not present

## 2015-11-01 DIAGNOSIS — F4320 Adjustment disorder, unspecified: Secondary | ICD-10-CM | POA: Diagnosis not present

## 2015-11-15 DIAGNOSIS — F4320 Adjustment disorder, unspecified: Secondary | ICD-10-CM | POA: Diagnosis not present

## 2015-11-15 DIAGNOSIS — M545 Low back pain: Secondary | ICD-10-CM | POA: Diagnosis not present

## 2015-11-15 DIAGNOSIS — G8929 Other chronic pain: Secondary | ICD-10-CM | POA: Diagnosis not present

## 2015-11-15 DIAGNOSIS — G894 Chronic pain syndrome: Secondary | ICD-10-CM | POA: Diagnosis not present

## 2015-12-13 DIAGNOSIS — M5416 Radiculopathy, lumbar region: Secondary | ICD-10-CM | POA: Diagnosis not present

## 2015-12-13 DIAGNOSIS — M79652 Pain in left thigh: Secondary | ICD-10-CM | POA: Diagnosis not present

## 2015-12-13 DIAGNOSIS — M25512 Pain in left shoulder: Secondary | ICD-10-CM | POA: Diagnosis not present

## 2015-12-13 DIAGNOSIS — M79672 Pain in left foot: Secondary | ICD-10-CM | POA: Diagnosis not present

## 2015-12-13 DIAGNOSIS — M542 Cervicalgia: Secondary | ICD-10-CM | POA: Diagnosis not present

## 2015-12-13 DIAGNOSIS — G603 Idiopathic progressive neuropathy: Secondary | ICD-10-CM | POA: Diagnosis not present

## 2015-12-13 DIAGNOSIS — M79605 Pain in left leg: Secondary | ICD-10-CM | POA: Diagnosis not present

## 2015-12-13 DIAGNOSIS — G894 Chronic pain syndrome: Secondary | ICD-10-CM | POA: Diagnosis not present

## 2015-12-13 DIAGNOSIS — G541 Lumbosacral plexus disorders: Secondary | ICD-10-CM | POA: Diagnosis not present

## 2015-12-13 DIAGNOSIS — M25552 Pain in left hip: Secondary | ICD-10-CM | POA: Diagnosis not present

## 2015-12-31 ENCOUNTER — Ambulatory Visit (INDEPENDENT_AMBULATORY_CARE_PROVIDER_SITE_OTHER): Payer: Medicare Other | Admitting: Nurse Practitioner

## 2015-12-31 ENCOUNTER — Encounter: Payer: Self-pay | Admitting: Nurse Practitioner

## 2015-12-31 ENCOUNTER — Ambulatory Visit (INDEPENDENT_AMBULATORY_CARE_PROVIDER_SITE_OTHER): Payer: Medicare Other

## 2015-12-31 VITALS — BP 140/94 | HR 75 | Temp 98.0°F | Ht 71.0 in | Wt 211.0 lb

## 2015-12-31 DIAGNOSIS — G8929 Other chronic pain: Secondary | ICD-10-CM

## 2015-12-31 DIAGNOSIS — M797 Fibromyalgia: Secondary | ICD-10-CM

## 2015-12-31 DIAGNOSIS — M25551 Pain in right hip: Secondary | ICD-10-CM

## 2015-12-31 DIAGNOSIS — I10 Essential (primary) hypertension: Secondary | ICD-10-CM

## 2015-12-31 DIAGNOSIS — E785 Hyperlipidemia, unspecified: Secondary | ICD-10-CM | POA: Diagnosis not present

## 2015-12-31 DIAGNOSIS — Z0000 Encounter for general adult medical examination without abnormal findings: Secondary | ICD-10-CM

## 2015-12-31 DIAGNOSIS — E119 Type 2 diabetes mellitus without complications: Secondary | ICD-10-CM | POA: Diagnosis not present

## 2015-12-31 DIAGNOSIS — M545 Low back pain: Secondary | ICD-10-CM | POA: Diagnosis not present

## 2015-12-31 DIAGNOSIS — Z23 Encounter for immunization: Secondary | ICD-10-CM

## 2015-12-31 DIAGNOSIS — Z6830 Body mass index (BMI) 30.0-30.9, adult: Secondary | ICD-10-CM

## 2015-12-31 DIAGNOSIS — R3912 Poor urinary stream: Secondary | ICD-10-CM | POA: Diagnosis not present

## 2015-12-31 LAB — POCT GLYCOSYLATED HEMOGLOBIN (HGB A1C): Hemoglobin A1C: 7.7

## 2015-12-31 NOTE — Progress Notes (Signed)
Subjective:    Patient ID: Michael Valenzuela, male    DOB: 09/02/1956, 60 y.o.   MRN: [id number]  Patient here today for CPE and  follow up of chronic medical problems.  Outpatient Encounter Prescriptions as of 12/31/2015  Medication Sig  . aspirin EC 81 MG tablet Take 1 tablet (81 mg total) by mouth daily.  . felodipine (PLENDIL) 5 MG 24 hr tablet Take 1 tablet (5 mg total) by mouth daily.  . fenofibrate 160 MG tablet Take 1 tablet (160 mg total) by mouth daily.  Marland Kitchen glucose blood test strip TrueTrack strips. Test BS qd. Dx E11.9  . hydrochlorothiazide (HYDRODIURIL) 25 MG tablet Take 1 tablet (25 mg total) by mouth daily.  El[employer] Gu[employer] Devices (ACCU-CHEK SOFTCLIX) lancets Use as instructed  . losartan (COZAAR) 100 MG tablet Take 1 tablet (100 mg total) by mouth daily.  . metFORMIN (GLUCOPHAGE XR) 500 MG 24 hr tablet 2 tablet po qd  . oxyCODONE (ROXICODONE) 15 MG immediate release tablet Take 15 mg by mouth 4 (four) times daily.  . rosuvastatin (CRESTOR) 10 MG tablet Take 1 tablet (10 mg total) by mouth daily.   No facility-administered encounter medications on file as of 12/31/2015.   * C/o right hip pain despite taking pain meds- can radiate down leg at times.  * Weak urine screen  Hypertension This is a chronic problem. The current episode started more than 1 year ago. The problem has been gradually improving since onset. The problem is controlled. Pertinent negatives include no chest pain, headaches, palpitations or shortness of breath. Risk factors for coronary artery disease include male gender, smoking/tobacco exposure and dyslipidemia. Past treatments include diuretics. The current treatment provides moderate improvement. Compliance problems include diet and exercise.  There is no history of CAD/MI, CVA or PVD.  Hyperlipidemia This is a chronic problem. The current episode started more than 1 year ago. The problem is controlled. Recent lipid tests were reviewed and are high. Factors  aggravating his hyperlipidemia include smoking. Pertinent negatives include no chest pain or shortness of breath. The current treatment provides mild improvement of lipids. Risk factors for coronary artery disease include male sex, hypertension and dyslipidemia.  Diabetes Pertinent negatives for hypoglycemia include no headaches. Pertinent negatives for diabetes include no chest pain. Pertinent negatives for diabetic complications include no CVA or PVD.  Chronic Low back pain-/fibromyalgia Dr. Lennox Grumbles pain management- has recently had 3 injections in back and neck which has helped some. Currently roxicodone QID. Patient has been seen out  In public on several occasions without limping like he does when he is in the office. He says that his pain has been a little worse the last several days because he has been moving around more. depression Refuses treatment even though he scored high on depression screen. Says that he does  Not like how the medicines make him feel.  Review of Systems  Constitutional: Negative.   HENT: Negative.   Respiratory: Negative for shortness of breath.   Cardiovascular: Negative for chest pain and palpitations.  Musculoskeletal: Positive for back pain and arthralgias.  Neurological: Negative for headaches.  Psychiatric/Behavioral: Negative.   All other systems reviewed and are negative.      Objective:   Physical Exam  Constitutional: He is oriented to person, place, and time. He appears well-developed and well-nourished.  HENT:  Head: Normocephalic.  Right Ear: External ear normal.  Left Ear: External ear normal.  Nose: Nose normal.  Mouth/Throat: Oropharynx is clear and moist.  Eyes: Conjunctivae and EOM are normal. Pupils are equal, round, and reactive to light.  Neck: Normal range of motion. Neck supple. No JVD present. No thyromegaly present.  Cardiovascular: Normal rate, regular rhythm, normal heart sounds and intact distal pulses.  Exam reveals no gallop and  no friction rub.   No murmur heard. Pulmonary/Chest: Effort normal and breath sounds normal. No respiratory distress. He has no wheezes. He has no rales. He exhibits no tenderness.  Abdominal: Soft. Bowel sounds are normal. He exhibits no mass. There is no tenderness.  Genitourinary: Prostate normal and penis normal.  Musculoskeletal: Normal range of motion. He exhibits tenderness (left antecubetal tenderness and bil leg burning sensation). He exhibits no edema.  Lymphadenopathy:    He has no cervical adenopathy.  Neurological: He is alert and oriented to person, place, and time. He has normal reflexes. No cranial nerve deficit.  Skin: Skin is warm and dry.  Psychiatric: He has a normal mood and affect. His behavior is normal. Judgment and thought content normal.    BP 140/94 mmHg  Pulse 75  Temp(Src) 98 F (36.7 C) (Oral)  Ht _0  (1.803 m)  Wt 211 lb (95.709 kg)  BMI 29.44 kg/m2   Chest x ray- no cardiopulmonary disease noted  EKG- NSR-Mary-Margaret Kolbee Bogusz, FNP  Results for orders placed or performed in visit on 12/31/15  POCT glycosylated hemoglobin (Hb A1C)  Result Value Ref Range   Hemoglobin A1C 7.7    Right hip x ray- normal- Preliminary reading by Ronnald Collum, FNP  [facility]       Assessment & Plan:  1. Annual physical exam  2. Essential hypertension, benign Do not add salt to diet - CMP14+EGFR - DG Chest 2 View; Future - EKG 12-Lead  3. Hyperlipidemia with target LDL less than 100 Low fat diet - Lipid panel  4. Type 2 diabetes mellitus without complication, without long-term current use of insulin (HCC) Stricter carb counting - POCT glycosylated hemoglobin (Hb A1C)  5. Fibromyalgia Follow up at pain managment exercise will really help with muscle aches  6. Chronic low back pain Follow up at pain clinic  7. BMI 30.0-30.9,adult Discussed diet and exercise for person with BMI >25 Will recheck weight in 3-6 months  8. Right hip pain Rest Stretching  exercises - DG HIP UNILAT W OR W/O PELVIS 2-3 VIEWS RIGHT; Future  9. Weak urinary stream - PSA, total and free - Ambulatory referral to Urology    Labs pending Health maintenance reviewed Diet and exercise encouraged Continue all meds Follow up  In 3 momths   [place], FNP

## 2015-12-31 NOTE — Patient Instructions (Signed)

## 2015-12-31 NOTE — Addendum Note (Signed)
Addended by: Rolena Infante on: 12/31/2015 12:47 PM   Modules accepted: Orders

## 2016-01-01 LAB — CMP14+EGFR
ALT: 66 IU/L — ABNORMAL HIGH (ref 0–44)
AST: 89 IU/L — ABNORMAL HIGH (ref 0–40)
Albumin/Globulin Ratio: 1.9 (ref 1.1–2.5)
Albumin: 4.9 g/dL — ABNORMAL HIGH (ref 3.6–4.8)
Alkaline Phosphatase: 71 IU/L (ref 39–117)
BUN/Creatinine Ratio: 22 (ref 10–22)
BUN: 17 mg/dL (ref 8–27)
Bilirubin Total: 0.4 mg/dL (ref 0.0–1.2)
CO2: 22 mmol/L (ref 18–29)
Calcium: 10.3 mg/dL — ABNORMAL HIGH (ref 8.6–10.2)
Chloride: 100 mmol/L (ref 96–106)
Creatinine, Ser: 0.77 mg/dL (ref 0.76–1.27)
GFR calc Af Amer: 114 mL/min/{1.73_m2} (ref >59)
GFR calc non Af Amer: 99 mL/min/{1.73_m2} (ref >59)
Globulin, Total: 2.6 g/dL (ref 1.5–4.5)
Glucose: 140 mg/dL — ABNORMAL HIGH (ref 65–99)
Potassium: 4.8 mmol/L (ref 3.5–5.2)
Sodium: 144 mmol/L (ref 134–144)
Total Protein: 7.5 g/dL (ref 6.0–8.5)

## 2016-01-01 LAB — LIPID PANEL
Chol/HDL Ratio: 4.7 ratio units (ref 0.0–5.0)
Cholesterol, Total: 161 mg/dL (ref 100–199)
HDL: 34 mg/dL — ABNORMAL LOW (ref >39)
LDL Calculated: 63 mg/dL (ref 0–99)
Triglycerides: 319 mg/dL — ABNORMAL HIGH (ref 0–149)
VLDL Cholesterol Cal: 64 mg/dL — ABNORMAL HIGH (ref 5–40)

## 2016-01-01 LAB — PSA, TOTAL AND FREE
PSA, Free Pct: 9.6 %
PSA, Free: 0.24 ng/mL
Prostate Specific Ag, Serum: 2.5 ng/mL (ref 0.0–4.0)

## 2016-01-10 DIAGNOSIS — Z79891 Long term (current) use of opiate analgesic: Secondary | ICD-10-CM | POA: Diagnosis not present

## 2016-01-10 DIAGNOSIS — M545 Low back pain: Secondary | ICD-10-CM | POA: Diagnosis not present

## 2016-01-10 DIAGNOSIS — M542 Cervicalgia: Secondary | ICD-10-CM | POA: Diagnosis not present

## 2016-01-10 DIAGNOSIS — M79652 Pain in left thigh: Secondary | ICD-10-CM | POA: Diagnosis not present

## 2016-01-10 DIAGNOSIS — M25512 Pain in left shoulder: Secondary | ICD-10-CM | POA: Diagnosis not present

## 2016-01-10 DIAGNOSIS — G894 Chronic pain syndrome: Secondary | ICD-10-CM | POA: Diagnosis not present

## 2016-02-06 ENCOUNTER — Ambulatory Visit: Payer: Medicare Other | Admitting: Urology

## 2016-02-25 DIAGNOSIS — M542 Cervicalgia: Secondary | ICD-10-CM | POA: Diagnosis not present

## 2016-02-25 DIAGNOSIS — Z79891 Long term (current) use of opiate analgesic: Secondary | ICD-10-CM | POA: Diagnosis not present

## 2016-02-25 DIAGNOSIS — G894 Chronic pain syndrome: Secondary | ICD-10-CM | POA: Diagnosis not present

## 2016-02-25 DIAGNOSIS — M545 Low back pain: Secondary | ICD-10-CM | POA: Diagnosis not present

## 2016-02-25 DIAGNOSIS — M791 Myalgia: Secondary | ICD-10-CM | POA: Diagnosis not present

## 2016-02-25 DIAGNOSIS — M79651 Pain in right thigh: Secondary | ICD-10-CM | POA: Diagnosis not present

## 2016-03-12 ENCOUNTER — Ambulatory Visit (INDEPENDENT_AMBULATORY_CARE_PROVIDER_SITE_OTHER): Payer: Medicare Other | Admitting: Urology

## 2016-03-12 DIAGNOSIS — N5201 Erectile dysfunction due to arterial insufficiency: Secondary | ICD-10-CM

## 2016-03-12 DIAGNOSIS — R3912 Poor urinary stream: Secondary | ICD-10-CM | POA: Diagnosis not present

## 2016-03-12 DIAGNOSIS — N401 Enlarged prostate with lower urinary tract symptoms: Secondary | ICD-10-CM

## 2016-03-12 DIAGNOSIS — R351 Nocturia: Secondary | ICD-10-CM

## 2016-03-20 DIAGNOSIS — H25093 Other age-related incipient cataract, bilateral: Secondary | ICD-10-CM | POA: Diagnosis not present

## 2016-03-20 DIAGNOSIS — H524 Presbyopia: Secondary | ICD-10-CM | POA: Diagnosis not present

## 2016-03-20 DIAGNOSIS — H538 Other visual disturbances: Secondary | ICD-10-CM | POA: Diagnosis not present

## 2016-03-20 DIAGNOSIS — H5213 Myopia, bilateral: Secondary | ICD-10-CM | POA: Diagnosis not present

## 2016-03-20 LAB — HM DIABETES EYE EXAM

## 2016-03-24 DIAGNOSIS — M25512 Pain in left shoulder: Secondary | ICD-10-CM | POA: Diagnosis not present

## 2016-03-24 DIAGNOSIS — M791 Myalgia: Secondary | ICD-10-CM | POA: Diagnosis not present

## 2016-03-24 DIAGNOSIS — M25511 Pain in right shoulder: Secondary | ICD-10-CM | POA: Diagnosis not present

## 2016-03-24 DIAGNOSIS — M79651 Pain in right thigh: Secondary | ICD-10-CM | POA: Diagnosis not present

## 2016-03-24 DIAGNOSIS — M542 Cervicalgia: Secondary | ICD-10-CM | POA: Diagnosis not present

## 2016-03-24 DIAGNOSIS — M545 Low back pain: Secondary | ICD-10-CM | POA: Diagnosis not present

## 2016-03-24 DIAGNOSIS — Z79891 Long term (current) use of opiate analgesic: Secondary | ICD-10-CM | POA: Diagnosis not present

## 2016-03-24 DIAGNOSIS — G894 Chronic pain syndrome: Secondary | ICD-10-CM | POA: Diagnosis not present

## 2016-03-28 ENCOUNTER — Other Ambulatory Visit: Payer: Self-pay

## 2016-03-28 DIAGNOSIS — E119 Type 2 diabetes mellitus without complications: Secondary | ICD-10-CM

## 2016-03-28 MED ORDER — METFORMIN HCL ER 500 MG PO TB24: ORAL_TABLET | ORAL | Status: DC

## 2016-04-01 ENCOUNTER — Encounter: Payer: Self-pay | Admitting: Nurse Practitioner

## 2016-04-01 ENCOUNTER — Ambulatory Visit (INDEPENDENT_AMBULATORY_CARE_PROVIDER_SITE_OTHER): Payer: Medicare Other | Admitting: Nurse Practitioner

## 2016-04-01 VITALS — BP 157/94 | HR 69 | Temp 97.2°F | Ht 71.0 in | Wt 213.0 lb

## 2016-04-01 DIAGNOSIS — M545 Low back pain: Secondary | ICD-10-CM | POA: Diagnosis not present

## 2016-04-01 DIAGNOSIS — I10 Essential (primary) hypertension: Secondary | ICD-10-CM | POA: Diagnosis not present

## 2016-04-01 DIAGNOSIS — E559 Vitamin D deficiency, unspecified: Secondary | ICD-10-CM

## 2016-04-01 DIAGNOSIS — E119 Type 2 diabetes mellitus without complications: Secondary | ICD-10-CM | POA: Diagnosis not present

## 2016-04-01 DIAGNOSIS — M797 Fibromyalgia: Secondary | ICD-10-CM

## 2016-04-01 DIAGNOSIS — E785 Hyperlipidemia, unspecified: Secondary | ICD-10-CM

## 2016-04-01 DIAGNOSIS — Z6829 Body mass index (BMI) 29.0-29.9, adult: Secondary | ICD-10-CM

## 2016-04-01 DIAGNOSIS — G8929 Other chronic pain: Secondary | ICD-10-CM

## 2016-04-01 LAB — LIPID PANEL
Chol/HDL Ratio: 4.6 ratio units (ref 0.0–5.0)
Cholesterol, Total: 143 mg/dL (ref 100–199)
HDL: 31 mg/dL — ABNORMAL LOW (ref >39)
Triglycerides: 473 mg/dL — ABNORMAL HIGH (ref 0–149)

## 2016-04-01 LAB — CMP14+EGFR
ALT: 37 IU/L (ref 0–44)
AST: 39 IU/L (ref 0–40)
Albumin/Globulin Ratio: 1.9 (ref 1.2–2.2)
Albumin: 4.7 g/dL (ref 3.6–4.8)
Alkaline Phosphatase: 67 IU/L (ref 39–117)
BUN/Creatinine Ratio: 20 (ref 10–24)
BUN: 15 mg/dL (ref 8–27)
Bilirubin Total: 0.4 mg/dL (ref 0.0–1.2)
CO2: 23 mmol/L (ref 18–29)
Calcium: 10.3 mg/dL — ABNORMAL HIGH (ref 8.6–10.2)
Chloride: 98 mmol/L (ref 96–106)
Creatinine, Ser: 0.74 mg/dL — ABNORMAL LOW (ref 0.76–1.27)
GFR calc Af Amer: 116 mL/min/{1.73_m2} (ref >59)
GFR calc non Af Amer: 100 mL/min/{1.73_m2} (ref >59)
Globulin, Total: 2.5 g/dL (ref 1.5–4.5)
Glucose: 169 mg/dL — ABNORMAL HIGH (ref 65–99)
Potassium: 4.5 mmol/L (ref 3.5–5.2)
Sodium: 142 mmol/L (ref 134–144)
Total Protein: 7.2 g/dL (ref 6.0–8.5)

## 2016-04-01 LAB — BAYER DCA HB A1C WAIVED: HB A1C (BAYER DCA - WAIVED): 6.9 % (ref <7.0)

## 2016-04-01 MED ORDER — FELODIPINE ER 5 MG PO TB24: 5.0000 mg | ORAL_TABLET | Freq: Every day | ORAL | Status: DC

## 2016-04-01 MED ORDER — HYDROCHLOROTHIAZIDE 25 MG PO TABS: 25.0000 mg | ORAL_TABLET | Freq: Every day | ORAL | Status: DC

## 2016-04-01 MED ORDER — ROSUVASTATIN CALCIUM 10 MG PO TABS: 10.0000 mg | ORAL_TABLET | Freq: Every day | ORAL | Status: DC

## 2016-04-01 MED ORDER — FENOFIBRATE 160 MG PO TABS: 160.0000 mg | ORAL_TABLET | Freq: Every day | ORAL | Status: DC

## 2016-04-01 MED ORDER — LOSARTAN POTASSIUM 100 MG PO TABS: 100.0000 mg | ORAL_TABLET | Freq: Every day | ORAL | Status: DC

## 2016-04-01 MED ORDER — METFORMIN HCL ER 500 MG PO TB24: ORAL_TABLET | ORAL | Status: DC

## 2016-04-01 NOTE — Progress Notes (Signed)
Subjective:    Patient ID: Michael Valenzuela, male    DOB: 1956-09-01, 60 y.o.   MRN: [id number]  Patient here today for follow up of chronic medical problems.  Outpatient Encounter Prescriptions as of 04/01/2016  Medication Sig  . aspirin EC 81 MG tablet Take 1 tablet (81 mg total) by mouth daily.  . felodipine (PLENDIL) 5 MG 24 hr tablet Take 1 tablet (5 mg total) by mouth daily.  . fenofibrate 160 MG tablet Take 1 tablet (160 mg total) by mouth daily.  Marland Kitchen glucose blood test strip TrueTrack strips. Test BS qd. Dx E11.9  . hydrochlorothiazide (HYDRODIURIL) 25 MG tablet Take 1 tablet (25 mg total) by mouth daily.  [employer] Gu[employer] Devices (ACCU-CHEK SOFTCLIX) lancets Use as instructed  . losartan (COZAAR) 100 MG tablet Take 1 tablet (100 mg total) by mouth daily.  . metFORMIN (GLUCOPHAGE XR) 500 MG 24 hr tablet 2 tablet po qd  . oxyCODONE (ROXICODONE) 15 MG immediate release tablet Take 15 mg by mouth 4 (four) times daily.  . rosuvastatin (CRESTOR) 10 MG tablet Take 1 tablet (10 mg total) by mouth daily.   No facility-administered encounter medications on file as of 04/01/2016.    Hypertension This is a chronic problem. The current episode started more than 1 year ago. The problem has been gradually improving since onset. The problem is controlled. Pertinent negatives include no chest pain, headaches, palpitations or shortness of breath. Risk factors for coronary artery disease include male gender, smoking/tobacco exposure and dyslipidemia. Past treatments include diuretics. The current treatment provides moderate improvement. Compliance problems include diet and exercise.  There is no history of CAD/MI, CVA or PVD.  Hyperlipidemia This is a chronic problem. The current episode started more than 1 year ago. The problem is controlled. Recent lipid tests were reviewed and are high. Factors aggravating his hyperlipidemia include smoking. Pertinent negatives include no chest pain or shortness of breath. The  current treatment provides mild improvement of lipids. Risk factors for coronary artery disease include male sex, hypertension and dyslipidemia.  Diabetes Pertinent negatives for hypoglycemia include no headaches. Pertinent negatives for diabetes include no chest pain. Pertinent negatives for diabetic complications include no CVA or PVD.  Chronic Low back pain-/fibromyalgia Dr. Lennox Grumbles pain management- has recently had 3 injections in back and neck which has helped some. Currently roxicodone QID. Patient has been seen out  In public on several occasions without limping like he does when he is in the office.  depression Refuses treatment even though he scored high on depression screen. Says that he does  Not like how the medicines make him feel. Vitamin d def Suppose to be on Vitamin d OTC- but does not always remember to take Review of Systems  Constitutional: Negative.   HENT: Negative.   Respiratory: Negative for shortness of breath.   Cardiovascular: Negative for chest pain and palpitations.  Musculoskeletal: Positive for back pain and arthralgias.  Neurological: Negative for headaches.  Psychiatric/Behavioral: Negative.   All other systems reviewed and are negative.      Objective:   Physical Exam  Constitutional: He is oriented to person, place, and time. He appears well-developed and well-nourished.  HENT:  Head: Normocephalic.  Eyes: Conjunctivae are normal. Pupils are equal, round, and reactive to light.  Neck: Normal range of motion.  Cardiovascular: Normal rate and regular rhythm.   Pulmonary/Chest: Effort normal and breath sounds normal.  Abdominal: Soft.  Musculoskeletal: He exhibits tenderness (left antecubetal tenderness and bil leg burning sensation).  Neurological: He is alert and oriented to person, place, and time. He has normal reflexes.  Skin: Skin is warm.  Psychiatric: He has a normal mood and affect. His behavior is normal. Judgment and thought content normal.       BP 157/94 mmHg  Pulse 69  Temp(Src) 97.2 F (36.2 C) (Oral)  Ht 5' 11"  (1.803 m)  Wt 213 lb (96.616 kg)  BMI 29.72 kg/m2  hgba1c 6.9% down from 7.7% 3 months ago      Assessment & Plan:   1. Essential hypertension, benign Do not add salt to diet - CMP14+EGFR - losartan (COZAAR) 100 MG tablet; Take 1 tablet (100 mg total) by mouth daily.  Dispense: 90 tablet; Refill: 1 - hydrochlorothiazide (HYDRODIURIL) 25 MG tablet; Take 1 tablet (25 mg total) by mouth daily.  Dispense: 90 tablet; Refill: 1 - felodipine (PLENDIL) 5 MG 24 hr tablet; Take 1 tablet (5 mg total) by mouth daily.  Dispense: 90 tablet; Refill: 1  2. Hyperlipidemia with target LDL less than 100 Low fat diet - Lipid panel - rosuvastatin (CRESTOR) 10 MG tablet; Take 1 tablet (10 mg total) by mouth daily.  Dispense: 90 tablet; Refill: 1 - fenofibrate 160 MG tablet; Take 1 tablet (160 mg total) by mouth daily.  Dispense: 90 tablet; Refill: 1  3. Type 2 diabetes mellitus without complication, without long-term current use of insulin (HCC) Continue to watch carbs in diet - Bayer DCA Hb A1c Waived - metFORMIN (GLUCOPHAGE XR) 500 MG 24 hr tablet; 2 tablet po qd  Dispense: 180 tablet; Refill: 1  4. Fibromyalgia Exercise as much as can  5. Chronic low back pain Continue pain clinic  6. BMI 29.0-29.9,adult Discussed diet and exercise for person with BMI >25 Will recheck weight in 3-6 months  7. Vitamin D deficiency Vit d OYTC daily    Labs pending Health maintenance reviewed Diet and exercise encouraged Continue all meds Follow up  In 3 months   [place], FNP

## 2016-04-01 NOTE — Patient Instructions (Signed)
Stress and Stress Management Stress is a normal reaction to life events. It is what you feel when life demands more than you are used to or more than you can handle. Some stress can be useful. For example, the stress reaction can help you catch the last bus of the day, study for a test, or meet a deadline at work. But stress that occurs too often or for too long can cause problems. It can affect your emotional health and interfere with relationships and normal daily activities. Too much stress can weaken your immune system and increase your risk for physical illness. If you already have a medical problem, stress can make it worse. CAUSES  All sorts of life events may cause stress. An event that causes stress for one person may not be stressful for another person. Major life events commonly cause stress. These may be positive or negative. Examples include losing your job, moving into a new home, getting married, having a baby, or losing a loved one. Less obvious life events may also cause stress, especially if they occur day after day or in combination. Examples include working long hours, driving in traffic, caring for children, being in debt, or being in a difficult relationship. SIGNS AND SYMPTOMS Stress may cause emotional symptoms including, the following:  Anxiety. This is feeling worried, afraid, on edge, overwhelmed, or out of control.  Anger. This is feeling irritated or impatient.  Depression. This is feeling sad, down, helpless, or guilty.  Difficulty focusing, remembering, or making decisions. Stress may cause physical symptoms, including the following:   Aches and pains. These may affect your head, neck, back, stomach, or other areas of your body.  Tight muscles or clenched jaw.  Low energy or trouble sleeping. Stress may cause unhealthy behaviors, including the following:   Eating to feel better (overeating) or skipping meals.  Sleeping too little, too much, or both.  Working  too much or putting off tasks (procrastination).  Smoking, drinking alcohol, or using drugs to feel better. DIAGNOSIS  Stress is diagnosed through an assessment by your health care provider. Your health care provider will ask questions about your symptoms and any stressful life events.Your health care provider will also ask about your medical history and may order blood tests or other tests. Certain medical conditions and medicine can cause physical symptoms similar to stress. Mental illness can cause emotional symptoms and unhealthy behaviors similar to stress. Your health care provider may refer you to a mental health professional for further evaluation.  TREATMENT  Stress management is the recommended treatment for stress.The goals of stress management are reducing stressful life events and coping with stress in healthy ways.  Techniques for reducing stressful life events include the following:  Stress identification. Self-monitor for stress and identify what causes stress for you. These skills may help you to avoid some stressful events.  Time management. Set your priorities, keep a calendar of events, and learn to say "no." These tools can help you avoid making too many commitments. Techniques for coping with stress include the following:  Rethinking the problem. Try to think realistically about stressful events rather than ignoring them or overreacting. Try to find the positives in a stressful situation rather than focusing on the negatives.  Exercise. Physical exercise can release both physical and emotional tension. The key is to find a form of exercise you enjoy and do it regularly.  Relaxation techniques. These relax the body and mind. Examples include yoga, meditation, tai chi, biofeedback, deep  breathing, progressive muscle relaxation, listening to music, being out in nature, journaling, and other hobbies. Again, the key is to find one or more that you enjoy and can do  regularly.  Healthy lifestyle. Eat a balanced diet, get plenty of sleep, and do not smoke. Avoid using alcohol or drugs to relax.  Strong support network. Spend time with family, friends, or other people you enjoy being around.Express your feelings and talk things over with someone you trust. Counseling or talktherapy with a mental health professional may be helpful if you are having difficulty managing stress on your own. Medicine is typically not recommended for the treatment of stress.Talk to your health care provider if you think you need medicine for symptoms of stress. HOME CARE INSTRUCTIONS  Keep all follow-up visits as directed by your health care provider.  Take all medicines as directed by your health care provider. SEEK MEDICAL CARE IF:  Your symptoms get worse or you start having new symptoms.  You feel overwhelmed by your problems and can no longer manage them on your own. SEEK IMMEDIATE MEDICAL CARE IF:  You feel like hurting yourself or someone else.   This information is not intended to replace advice given to you by your health care provider. Make sure you discuss any questions you have with your health care provider.   Document Released: 04/22/2001 Document Revised: 11/17/2014 Document Reviewed: 06/21/2013 Elsevier Interactive Patient Education 2016 Elsevier Inc.  

## 2016-04-25 DIAGNOSIS — Z79891 Long term (current) use of opiate analgesic: Secondary | ICD-10-CM | POA: Diagnosis not present

## 2016-04-25 DIAGNOSIS — M791 Myalgia: Secondary | ICD-10-CM | POA: Diagnosis not present

## 2016-04-25 DIAGNOSIS — M542 Cervicalgia: Secondary | ICD-10-CM | POA: Diagnosis not present

## 2016-04-25 DIAGNOSIS — M79672 Pain in left foot: Secondary | ICD-10-CM | POA: Diagnosis not present

## 2016-04-25 DIAGNOSIS — M79651 Pain in right thigh: Secondary | ICD-10-CM | POA: Diagnosis not present

## 2016-04-25 DIAGNOSIS — G894 Chronic pain syndrome: Secondary | ICD-10-CM | POA: Diagnosis not present

## 2016-05-23 DIAGNOSIS — M542 Cervicalgia: Secondary | ICD-10-CM | POA: Diagnosis not present

## 2016-05-23 DIAGNOSIS — M791 Myalgia: Secondary | ICD-10-CM | POA: Diagnosis not present

## 2016-05-23 DIAGNOSIS — M25551 Pain in right hip: Secondary | ICD-10-CM | POA: Diagnosis not present

## 2016-05-23 DIAGNOSIS — G894 Chronic pain syndrome: Secondary | ICD-10-CM | POA: Diagnosis not present

## 2016-05-23 DIAGNOSIS — M545 Low back pain: Secondary | ICD-10-CM | POA: Diagnosis not present

## 2016-05-23 DIAGNOSIS — Z79891 Long term (current) use of opiate analgesic: Secondary | ICD-10-CM | POA: Diagnosis not present

## 2016-05-23 DIAGNOSIS — M25512 Pain in left shoulder: Secondary | ICD-10-CM | POA: Diagnosis not present

## 2016-05-23 DIAGNOSIS — M25511 Pain in right shoulder: Secondary | ICD-10-CM | POA: Diagnosis not present

## 2016-06-18 DIAGNOSIS — M25551 Pain in right hip: Secondary | ICD-10-CM | POA: Diagnosis not present

## 2016-06-18 DIAGNOSIS — Z79891 Long term (current) use of opiate analgesic: Secondary | ICD-10-CM | POA: Diagnosis not present

## 2016-06-18 DIAGNOSIS — G894 Chronic pain syndrome: Secondary | ICD-10-CM | POA: Diagnosis not present

## 2016-06-18 DIAGNOSIS — M545 Low back pain: Secondary | ICD-10-CM | POA: Diagnosis not present

## 2016-06-18 DIAGNOSIS — M542 Cervicalgia: Secondary | ICD-10-CM | POA: Diagnosis not present

## 2016-06-18 DIAGNOSIS — M791 Myalgia: Secondary | ICD-10-CM | POA: Diagnosis not present

## 2016-06-18 DIAGNOSIS — M25512 Pain in left shoulder: Secondary | ICD-10-CM | POA: Diagnosis not present

## 2016-06-18 DIAGNOSIS — M25511 Pain in right shoulder: Secondary | ICD-10-CM | POA: Diagnosis not present

## 2016-06-27 ENCOUNTER — Telehealth: Payer: Self-pay | Admitting: Nurse Practitioner

## 2016-06-27 NOTE — Telephone Encounter (Signed)
Patient states that you discussed starting him on a muscle relaxer at the last visit but he declined because he is already taking a lot of medicines. States his headaches has worsened and his neck is hurting and would like to try the muscle relaxers. He has an appt with you next Friday and would like for you to go ahead and send them to the pharmacy so he can try them and discuss how they are working on Friday at appt

## 2016-06-30 ENCOUNTER — Other Ambulatory Visit: Payer: Self-pay | Admitting: Nurse Practitioner

## 2016-06-30 MED ORDER — CYCLOBENZAPRINE HCL 5 MG PO TABS: 5.0000 mg | ORAL_TABLET | Freq: Three times a day (TID) | ORAL | 0 refills | Status: DC | PRN

## 2016-06-30 NOTE — Progress Notes (Signed)
Flexeril rx sent to pharmacy

## 2016-07-04 ENCOUNTER — Ambulatory Visit (INDEPENDENT_AMBULATORY_CARE_PROVIDER_SITE_OTHER): Payer: Medicare Other | Admitting: Nurse Practitioner

## 2016-07-04 ENCOUNTER — Encounter: Payer: Self-pay | Admitting: Nurse Practitioner

## 2016-07-04 VITALS — BP 130/87 | HR 77 | Temp 97.6°F | Ht 70.0 in | Wt 212.0 lb

## 2016-07-04 DIAGNOSIS — E785 Hyperlipidemia, unspecified: Secondary | ICD-10-CM | POA: Diagnosis not present

## 2016-07-04 DIAGNOSIS — M545 Low back pain, unspecified: Secondary | ICD-10-CM

## 2016-07-04 DIAGNOSIS — Z6830 Body mass index (BMI) 30.0-30.9, adult: Secondary | ICD-10-CM | POA: Diagnosis not present

## 2016-07-04 DIAGNOSIS — G8929 Other chronic pain: Secondary | ICD-10-CM | POA: Diagnosis not present

## 2016-07-04 DIAGNOSIS — E119 Type 2 diabetes mellitus without complications: Secondary | ICD-10-CM | POA: Diagnosis not present

## 2016-07-04 DIAGNOSIS — M797 Fibromyalgia: Secondary | ICD-10-CM | POA: Diagnosis not present

## 2016-07-04 DIAGNOSIS — Z1212 Encounter for screening for malignant neoplasm of rectum: Secondary | ICD-10-CM

## 2016-07-04 DIAGNOSIS — E559 Vitamin D deficiency, unspecified: Secondary | ICD-10-CM | POA: Diagnosis not present

## 2016-07-04 DIAGNOSIS — I10 Essential (primary) hypertension: Secondary | ICD-10-CM | POA: Diagnosis not present

## 2016-07-04 LAB — BAYER DCA HB A1C WAIVED: HB A1C (BAYER DCA - WAIVED): 7.5 % — ABNORMAL HIGH (ref <7.0)

## 2016-07-04 NOTE — Addendum Note (Signed)
Addended by: Earlene Plater on: 07/04/2016 04:29 PM   Modules accepted: Orders

## 2016-07-04 NOTE — Addendum Note (Signed)
Addended by: Rolena Infante on: 07/04/2016 03:08 PM   Modules accepted: Orders

## 2016-07-04 NOTE — Progress Notes (Signed)
Subjective:    Patient ID: Michael Valenzuela, male    DOB: Nov 16, 1955, 60 y.o.   MRN: [id number]  Patient here today for follow up of chronic medical problems.  Outpatient Encounter Prescriptions as of 07/04/2016  Medication Sig  . aspirin EC 81 MG tablet Take 1 tablet (81 mg total) by mouth daily.  . cyclobenzaprine (FLEXERIL) 5 MG tablet Take 1 tablet (5 mg total) by mouth 3 (three) times daily as needed for muscle spasms.  . felodipine (PLENDIL) 5 MG 24 hr tablet Take 1 tablet (5 mg total) by mouth daily.  . fenofibrate 160 MG tablet Take 1 tablet (160 mg total) by mouth daily.  Marland Kitchen glucose blood test strip TrueTrack strips. Test BS qd. Dx E11.9  . hydrochlorothiazide (HYDRODIURIL) 25 MG tablet Take 1 tablet (25 mg total) by mouth daily.  [employer] Devices (ACCU-CHEK SOFTCLIX) lancets Use as instructed  . losartan (COZAAR) 100 MG tablet Take 1 tablet (100 mg total) by mouth daily.  . metFORMIN (GLUCOPHAGE XR) 500 MG 24 hr tablet 2 tablet po qd  . oxyCODONE (ROXICODONE) 15 MG immediate release tablet Take 15 mg by mouth 4 (four) times daily.  . rosuvastatin (CRESTOR) 10 MG tablet Take 1 tablet (10 mg total) by mouth daily.   No facility-administered encounter medications on file as of 07/04/2016.     Hypertension  This is a chronic problem. The current episode started more than 1 year ago. The problem has been gradually improving since onset. The problem is controlled. Pertinent negatives include no chest pain, headaches, palpitations or shortness of breath. Risk factors for coronary artery disease include male gender, smoking/tobacco exposure and dyslipidemia. Past treatments include diuretics. The current treatment provides moderate improvement. Compliance problems include diet and exercise.  There is no history of CAD/MI, CVA or PVD.  Hyperlipidemia  This is a chronic problem. The current episode started more than 1 year ago. The problem is controlled. Recent lipid tests were reviewed and are  high. Factors aggravating his hyperlipidemia include smoking. Pertinent negatives include no chest pain or shortness of breath. The current treatment provides mild improvement of lipids. Risk factors for coronary artery disease include male sex, hypertension and dyslipidemia.  Diabetes  Pertinent negatives for hypoglycemia include no headaches. Pertinent negatives for diabetes include no chest pain. Pertinent negatives for diabetic complications include no CVA or PVD.  Chronic Low back pain-/fibromyalgia Dr. Lennox Grumbles pain management- has recently had 3 injections in back and neck which has helped some. Currently roxicodone QID. Patient has been seen out  In public on several occasions without limping like he does when he is in the office.  He says that his neck has increased in pain- he is planning on talking with Dr. Lennox Grumbles about it the next time e goes. depression Refuses treatment even though he scored high on depression screen. Says that he does  Not like how the medicines make him feel. Depression screen [facility] 2/9 07/04/2016 04/01/2016 09/25/2015 06/21/2015 03/19/2015  Decreased Interest 3 2 2 2 2   Down, Depressed, Hopeless 3 2 2 3 2   PHQ - 2 Score 6 4 4 5 4   Altered sleeping 3 2 3 3 3   Tired, decreased energy 3 2 3 3 3   Change in appetite 0 0 0 0 0  Feeling bad or failure about yourself  2 1 1 2 2   Trouble concentrating 1 1 1 2 1   Moving slowly or fidgety/restless 0 0 0 0 0  Suicidal thoughts 0 0  0 0 0  PHQ-9 Score 15 10 12 15 13   Some recent data might be hidden   Vitamin d def Suppose to be on Vitamin d OTC- but does not always remember to take  Review of Systems  Constitutional: Negative.   HENT: Negative.   Respiratory: Negative for shortness of breath.   Cardiovascular: Negative for chest pain and palpitations.  Musculoskeletal: Positive for arthralgias and back pain.  Neurological: Negative for headaches.  Psychiatric/Behavioral: Negative.   All other systems reviewed and are  negative.      Objective:   Physical Exam  Constitutional: He is oriented to person, place, and time. He appears well-developed and well-nourished.  HENT:  Head: Normocephalic.  Eyes: Conjunctivae are normal. Pupils are equal, round, and reactive to light.  Neck: Normal range of motion.  Cardiovascular: Normal rate and regular rhythm.   Pulmonary/Chest: Effort normal and breath sounds normal.  Abdominal: Soft.  Musculoskeletal: He exhibits tenderness (left antecubetal tenderness and bil leg burning sensation).  Neurological: He is alert and oriented to person, place, and time. He has normal reflexes.  Skin: Skin is warm.  Psychiatric: He has a normal mood and affect. His behavior is normal. Judgment and thought content normal.   BP 130/87   Pulse 77   Temp 97.6 F (36.4 C) (Oral)   Ht 5' 10"  (1.778 m)   Wt 212 lb (96.2 kg)   BMI 30.42 kg/m      HGBA1c 7.5%  Assessment & Plan:  1. Essential hypertension, benign Do not add salt to diet - CMP14+EGFR  2. Hyperlipidemia with target LDL less than 100 Low fat diet - Lipid panel  3. Type 2 diabetes mellitus without complication, without long-term current use of insulin (HCC)   - Bayer DCA Hb A1c Waived - Microalbumin / creatinine urine ratio  4. Fibromyalgia Stricter carb counting  5. BMI 30.0-30.9,adult Discussed diet and exercise for person with BMI >25 Will recheck weight in 3-6 months  6. Chronic low back pain Need to talk with pain clinic about pain  7. Vitamin D deficiency    Labs pending Health maintenance reviewed Diet and exercise encouraged Continue all meds Follow up  In 3 months [place], FNP

## 2016-07-05 LAB — CMP14+EGFR
ALT: 91 IU/L — ABNORMAL HIGH (ref 0–44)
AST: 105 IU/L — ABNORMAL HIGH (ref 0–40)
Albumin/Globulin Ratio: 1.9 (ref 1.2–2.2)
Albumin: 4.8 g/dL (ref 3.6–4.8)
Alkaline Phosphatase: 64 IU/L (ref 39–117)
BUN/Creatinine Ratio: 19 (ref 10–24)
BUN: 18 mg/dL (ref 8–27)
Bilirubin Total: 0.4 mg/dL (ref 0.0–1.2)
CO2: 25 mmol/L (ref 18–29)
Calcium: 10 mg/dL (ref 8.6–10.2)
Chloride: 100 mmol/L (ref 96–106)
Creatinine, Ser: 0.93 mg/dL (ref 0.76–1.27)
GFR calc Af Amer: 102 mL/min/{1.73_m2} (ref >59)
GFR calc non Af Amer: 88 mL/min/{1.73_m2} (ref >59)
Globulin, Total: 2.5 g/dL (ref 1.5–4.5)
Glucose: 154 mg/dL — ABNORMAL HIGH (ref 65–99)
Potassium: 4.6 mmol/L (ref 3.5–5.2)
Sodium: 142 mmol/L (ref 134–144)
Total Protein: 7.3 g/dL (ref 6.0–8.5)

## 2016-07-05 LAB — LIPID PANEL
Chol/HDL Ratio: 4.1 ratio units (ref 0.0–5.0)
Cholesterol, Total: 147 mg/dL (ref 100–199)
HDL: 36 mg/dL — ABNORMAL LOW (ref >39)
LDL Calculated: 59 mg/dL (ref 0–99)
Triglycerides: 261 mg/dL — ABNORMAL HIGH (ref 0–149)
VLDL Cholesterol Cal: 52 mg/dL — ABNORMAL HIGH (ref 5–40)

## 2016-07-05 LAB — FECAL OCCULT BLOOD, IMMUNOCHEMICAL: Fecal Occult Bld: NEGATIVE

## 2016-07-07 LAB — MICROALBUMIN / CREATININE URINE RATIO
Creatinine, Urine: 117.2 mg/dL
MICROALB/CREAT RATIO: <2.6 mg/g creat (ref 0.0–30.0)
Microalbumin, Urine: <3 ug/mL

## 2016-07-23 DIAGNOSIS — M79651 Pain in right thigh: Secondary | ICD-10-CM | POA: Diagnosis not present

## 2016-07-23 DIAGNOSIS — M542 Cervicalgia: Secondary | ICD-10-CM | POA: Diagnosis not present

## 2016-07-23 DIAGNOSIS — G894 Chronic pain syndrome: Secondary | ICD-10-CM | POA: Diagnosis not present

## 2016-07-23 DIAGNOSIS — Z79891 Long term (current) use of opiate analgesic: Secondary | ICD-10-CM | POA: Diagnosis not present

## 2016-07-23 DIAGNOSIS — M25551 Pain in right hip: Secondary | ICD-10-CM | POA: Diagnosis not present

## 2016-07-23 DIAGNOSIS — M791 Myalgia: Secondary | ICD-10-CM | POA: Diagnosis not present

## 2016-08-18 DIAGNOSIS — M545 Low back pain: Secondary | ICD-10-CM | POA: Diagnosis not present

## 2016-08-18 DIAGNOSIS — M25512 Pain in left shoulder: Secondary | ICD-10-CM | POA: Diagnosis not present

## 2016-08-18 DIAGNOSIS — G894 Chronic pain syndrome: Secondary | ICD-10-CM | POA: Diagnosis not present

## 2016-08-18 DIAGNOSIS — Z79891 Long term (current) use of opiate analgesic: Secondary | ICD-10-CM | POA: Diagnosis not present

## 2016-08-18 DIAGNOSIS — M79652 Pain in left thigh: Secondary | ICD-10-CM | POA: Diagnosis not present

## 2016-08-18 DIAGNOSIS — M542 Cervicalgia: Secondary | ICD-10-CM | POA: Diagnosis not present

## 2016-08-18 DIAGNOSIS — M25511 Pain in right shoulder: Secondary | ICD-10-CM | POA: Diagnosis not present

## 2016-08-25 ENCOUNTER — Other Ambulatory Visit: Payer: Self-pay | Admitting: *Deleted

## 2016-08-26 MED ORDER — CYCLOBENZAPRINE HCL 5 MG PO TABS: 5.0000 mg | ORAL_TABLET | Freq: Three times a day (TID) | ORAL | 0 refills | Status: DC | PRN

## 2016-09-16 DIAGNOSIS — G894 Chronic pain syndrome: Secondary | ICD-10-CM | POA: Diagnosis not present

## 2016-09-16 DIAGNOSIS — M545 Low back pain: Secondary | ICD-10-CM | POA: Diagnosis not present

## 2016-09-16 DIAGNOSIS — Z79891 Long term (current) use of opiate analgesic: Secondary | ICD-10-CM | POA: Diagnosis not present

## 2016-09-16 DIAGNOSIS — M542 Cervicalgia: Secondary | ICD-10-CM | POA: Diagnosis not present

## 2016-09-16 DIAGNOSIS — M791 Myalgia: Secondary | ICD-10-CM | POA: Diagnosis not present

## 2016-09-16 DIAGNOSIS — M25511 Pain in right shoulder: Secondary | ICD-10-CM | POA: Diagnosis not present

## 2016-09-16 DIAGNOSIS — M25512 Pain in left shoulder: Secondary | ICD-10-CM | POA: Diagnosis not present

## 2016-10-06 ENCOUNTER — Ambulatory Visit (INDEPENDENT_AMBULATORY_CARE_PROVIDER_SITE_OTHER): Payer: Medicare Other | Admitting: Nurse Practitioner

## 2016-10-06 ENCOUNTER — Encounter: Payer: Self-pay | Admitting: Nurse Practitioner

## 2016-10-06 VITALS — BP 148/96 | HR 67 | Temp 97.6°F | Ht 70.0 in | Wt 210.0 lb

## 2016-10-06 DIAGNOSIS — M5440 Lumbago with sciatica, unspecified side: Secondary | ICD-10-CM | POA: Diagnosis not present

## 2016-10-06 DIAGNOSIS — I10 Essential (primary) hypertension: Secondary | ICD-10-CM | POA: Diagnosis not present

## 2016-10-06 DIAGNOSIS — Z6830 Body mass index (BMI) 30.0-30.9, adult: Secondary | ICD-10-CM

## 2016-10-06 DIAGNOSIS — F17200 Nicotine dependence, unspecified, uncomplicated: Secondary | ICD-10-CM | POA: Diagnosis not present

## 2016-10-06 DIAGNOSIS — E785 Hyperlipidemia, unspecified: Secondary | ICD-10-CM | POA: Diagnosis not present

## 2016-10-06 DIAGNOSIS — M797 Fibromyalgia: Secondary | ICD-10-CM | POA: Diagnosis not present

## 2016-10-06 DIAGNOSIS — G8929 Other chronic pain: Secondary | ICD-10-CM | POA: Diagnosis not present

## 2016-10-06 DIAGNOSIS — Z23 Encounter for immunization: Secondary | ICD-10-CM

## 2016-10-06 DIAGNOSIS — E559 Vitamin D deficiency, unspecified: Secondary | ICD-10-CM | POA: Diagnosis not present

## 2016-10-06 DIAGNOSIS — E119 Type 2 diabetes mellitus without complications: Secondary | ICD-10-CM

## 2016-10-06 LAB — BAYER DCA HB A1C WAIVED: HB A1C (BAYER DCA - WAIVED): 8.4 % — ABNORMAL HIGH (ref <7.0)

## 2016-10-06 MED ORDER — HYDROCHLOROTHIAZIDE 25 MG PO TABS: 25.0000 mg | ORAL_TABLET | Freq: Every day | ORAL | 1 refills | Status: DC

## 2016-10-06 MED ORDER — METFORMIN HCL 1000 MG PO TABS: 1000.0000 mg | ORAL_TABLET | Freq: Two times a day (BID) | ORAL | 1 refills | Status: DC

## 2016-10-06 MED ORDER — FENOFIBRATE 160 MG PO TABS: 160.0000 mg | ORAL_TABLET | Freq: Every day | ORAL | 1 refills | Status: DC

## 2016-10-06 MED ORDER — FELODIPINE ER 5 MG PO TB24: 5.0000 mg | ORAL_TABLET | Freq: Every day | ORAL | 1 refills | Status: DC

## 2016-10-06 MED ORDER — LOSARTAN POTASSIUM 100 MG PO TABS: 100.0000 mg | ORAL_TABLET | Freq: Every day | ORAL | 1 refills | Status: DC

## 2016-10-06 MED ORDER — CYCLOBENZAPRINE HCL 5 MG PO TABS: 5.0000 mg | ORAL_TABLET | Freq: Three times a day (TID) | ORAL | 3 refills | Status: DC | PRN

## 2016-10-06 MED ORDER — METFORMIN HCL ER 500 MG PO TB24: ORAL_TABLET | ORAL | 1 refills | Status: DC

## 2016-10-06 NOTE — Patient Instructions (Signed)
Carbohydrate Counting for Diabetes Mellitus, Adult Carbohydrate counting is a method for keeping track of how many carbohydrates you eat. Eating carbohydrates naturally increases the amount of sugar (glucose) in the blood. Counting how many carbohydrates you eat helps keep your blood glucose within normal limits, which helps you manage your diabetes (diabetes mellitus). It is important to know how many carbohydrates you can safely have in each meal. This is different for every person. A diet and nutrition specialist (registered dietitian) can help you make a meal plan and calculate how many carbohydrates you should have at each meal and snack. Carbohydrates are found in the following foods:  Grains, such as breads and cereals.  Dried beans and soy products.  Starchy vegetables, such as potatoes, peas, and corn.  Fruit and fruit juices.  Milk and yogurt.  Sweets and snack foods, such as cake, cookies, candy, chips, and soft drinks. How do I count carbohydrates? There are two ways to count carbohydrates in food. You can use either of the methods or a combination of both. Reading "Nutrition Facts" on packaged food  The "Nutrition Facts" list is included on the labels of almost all packaged foods and beverages in the [place] It includes:  The serving size.  Information about nutrients in each serving, including the grams (g) of carbohydrate per serving. To use the "Nutrition Facts":  Decide how many servings you will have.  Multiply the number of servings by the number of carbohydrates per serving.  The resulting number is the total amount of carbohydrates that you will be having. Learning standard serving sizes of other foods  When you eat foods containing carbohydrates that are not packaged or do not include "Nutrition Facts" on the label, you need to measure the servings in order to count the amount of carbohydrates:  Measure the foods that you will eat with a food scale or measuring  cup, if needed.  Decide how many standard-size servings you will eat.  Multiply the number of servings by 15. Most carbohydrate-rich foods have about 15 g of carbohydrates per serving.  For example, if you eat 8 oz (170 g) of strawberries, you will have eaten 2 servings and 30 g of carbohydrates (2 servings x 15 g = 30 g).  For foods that have more than one food mixed, such as soups and casseroles, you must count the carbohydrates in each food that is included. The following list contains standard serving sizes of common carbohydrate-rich foods. Each of these servings has about 15 g of carbohydrates:   hamburger bun or  English muffin.   oz (15 mL) syrup.   oz (14 g) jelly.  1 slice of bread.  1 six-inch tortilla.  3 oz (85 g) cooked rice or pasta.  4 oz (113 g) cooked dried beans.  4 oz (113 g) starchy vegetable, such as peas, corn, or potatoes.  4 oz (113 g) hot cereal.  4 oz (113 g) mashed potatoes or  of a large baked potato.  4 oz (113 g) canned or frozen fruit.  4 oz (120 mL) fruit juice.  4-6 crackers.  6 chicken nuggets.  6 oz (170 g) unsweetened dry cereal.  6 oz (170 g) plain fat-free yogurt or yogurt sweetened with artificial sweeteners.  8 oz (240 mL) milk.  8 oz (170 g) fresh fruit or one small piece of fruit.  24 oz (680 g) popped popcorn. Example of carbohydrate counting Sample meal  3 oz (85 g) chicken breast.  6 oz (  170 g) brown rice.  4 oz (113 g) corn.  8 oz (240 mL) milk.  8 oz (170 g) strawberries with sugar-free whipped topping. Carbohydrate calculation 1. Identify the foods that contain carbohydrates:  Rice.  Corn.  Milk.  Strawberries. 2. Calculate how many servings you have of each food:  2 servings rice.  1 serving corn.  1 serving milk.  1 serving strawberries. 3. Multiply each number of servings by 15 g:  2 servings rice x 15 g = 30 g.  1 serving corn x 15 g = 15 g.  1 serving milk x 15 g = 15  g.  1 serving strawberries x 15 g = 15 g. 4. Add together all of the amounts to find the total grams of carbohydrates eaten:  30 g + 15 g + 15 g + 15 g = 75 g of carbohydrates total. This information is not intended to replace advice given to you by your health care provider. Make sure you discuss any questions you have with your health care provider. Document Released: 10/27/2005 Document Revised: 05/16/2016 Document Reviewed: 04/09/2016 Elsevier Interactive Patient Education  2017 Elsevier Inc.  

## 2016-10-06 NOTE — Progress Notes (Signed)
Subjective:    Patient ID: Michael Valenzuela, male    DOB: 25-Aug-1956, 60 y.o.   MRN: [id number]  Patient here today for follow up of chronic medical problems.  Outpatient Encounter Prescriptions as of 10/06/2016  Medication Sig  . aspirin EC 81 MG tablet Take 1 tablet (81 mg total) by mouth daily.  . cyclobenzaprine (FLEXERIL) 5 MG tablet Take 1 tablet (5 mg total) by mouth 3 (three) times daily as needed for muscle spasms.  . felodipine (PLENDIL) 5 MG 24 hr tablet Take 1 tablet (5 mg total) by mouth daily.  . fenofibrate 160 MG tablet Take 1 tablet (160 mg total) by mouth daily.  Marland Kitchen glucose blood test strip TrueTrack strips. Test BS qd. Dx E11.9  . hydrochlorothiazide (HYDRODIURIL) 25 MG tablet Take 1 tablet (25 mg total) by mouth daily.  [employer] Devices (ACCU-CHEK SOFTCLIX) lancets Use as instructed  . losartan (COZAAR) 100 MG tablet Take 1 tablet (100 mg total) by mouth daily.  . metFORMIN (GLUCOPHAGE XR) 500 MG 24 hr tablet 2 tablet po qd  . oxyCODONE (ROXICODONE) 15 MG immediate release tablet Take 15 mg by mouth 4 (four) times daily.  . rosuvastatin (CRESTOR) 10 MG tablet Take 1 tablet (10 mg total) by mouth daily.   No facility-administered encounter medications on file as of 10/06/2016.     Hypertension  This is a chronic problem. The current episode started more than 1 year ago. The problem has been gradually improving since onset. The problem is controlled. Associated symptoms include neck pain. Pertinent negatives include no chest pain, headaches, palpitations or shortness of breath. Risk factors for coronary artery disease include male gender, smoking/tobacco exposure and dyslipidemia. Past treatments include diuretics. The current treatment provides moderate improvement. Compliance problems include diet and exercise.  There is no history of CAD/MI, CVA or PVD.  Hyperlipidemia  This is a chronic problem. The current episode started more than 1 year ago. The problem is controlled.  Recent lipid tests were reviewed and are high. Factors aggravating his hyperlipidemia include smoking. Pertinent negatives include no chest pain or shortness of breath. The current treatment provides mild improvement of lipids. Risk factors for coronary artery disease include male sex, hypertension and dyslipidemia.  Diabetes  Pertinent negatives for hypoglycemia include no headaches. Pertinent negatives for diabetes include no chest pain. Pertinent negatives for diabetic complications include no CVA or PVD. He is following a generally unhealthy diet. When asked about meal planning, he reported none. He has not had a previous visit with a dietitian. He never participates in exercise. Home blood sugar record trend: does ot check blood sugars consistently. His breakfast blood glucose is taken between 9-10 am. His breakfast blood glucose range is generally 180-200 mg/dl. His highest blood glucose is >200 mg/dl. His overall blood glucose range is 180-200 mg/dl. An ACE inhibitor/angiotensin II receptor blocker is being taken. He does not see a podiatrist.Eye exam is not current.  Chronic Low back pain-/fibromyalgia Dr. Lennox Grumbles pain management- has recently had 3 injections in back and neck which has helped some. Currently roxicodone QID. Patient has been seen out  In public on several occasions without limping like he does when he is in the office. He is still c/o increasing neck pain- he told Dr. Lennox Grumbles but they did not increase his pain meds- he says that flexeril helps some. depression Refuses treatment even though he scored high on depression screen. Says that he does  Not like how the medicines make him feel.  Depression screen [facility] 2/9 10/06/2016 07/04/2016 04/01/2016 09/25/2015 06/21/2015  Decreased Interest 0 _0 Down, Depressed, Hopeless 0 _1 PHQ - 2 Score 0 _2 Altered sleeping - _3 Tired, decreased energy - _4 Change in appetite - 0 0 0 0  Feeling bad or failure about yourself   - _5 Trouble concentrating - _6 Moving slowly or fidgety/restless - 0 0 0 0  Suicidal thoughts - 0 0 0 0  PHQ-9 Score - _7 Some recent data might be hidden   Vitamin d def Suppose to be on Vitamin d OTC- but does not always remember to take  Review of Systems  Constitutional: Negative.   HENT: Negative.   Respiratory: Negative for shortness of breath.   Cardiovascular: Negative for chest pain and palpitations.  Musculoskeletal: Positive for arthralgias, back pain and neck pain.  Neurological: Negative for headaches.  Psychiatric/Behavioral: Negative.   All other systems reviewed and are negative.      Objective:   Physical Exam  Constitutional: He is oriented to person, place, and time. He appears well-developed and well-nourished.  HENT:  Head: Normocephalic.  Eyes: Conjunctivae are normal. Pupils are equal, round, and reactive to light.  Neck: Normal range of motion.  Cardiovascular: Normal rate and regular rhythm.   Pulmonary/Chest: Effort normal and breath sounds normal.  Abdominal: Soft.  Musculoskeletal: He exhibits tenderness (left antecubetal tenderness and bil leg burning sensation).  Still walking with slight left sided limp when in office.  Bends over to put shoes on without probems and was able to easily pick coat up out of floor without problems. (-) SLR bilaterally  Neurological: He is alert and oriented to person, place, and time. He has normal reflexes.  Skin: Skin is warm.  Psychiatric: He has a normal mood and affect. His behavior is normal. Judgment and thought content normal.   BP (!) 148/96   Pulse 67   Temp 97.6 F (36.4 C) (Oral)   Ht _8  (1.778 m)   Wt 210 lb (95.3 kg)   BMI 30.13 kg/m      HGBA1c- 8.4% which is up from 7.3% at last visit Assessment & Plan:  1. Essential hypertension, benign Low sodium diet - CMP14+EGFR - felodipine (PLENDIL) 5 MG 24 hr tablet; Take 1 tablet (5 mg total) by mouth daily.  Dispense:  90 tablet; Refill: 1 - hydrochlorothiazide (HYDRODIURIL) 25 MG tablet; Take 1 tablet (25 mg total) by mouth daily.  Dispense: 90 tablet; Refill: 1 - losartan (COZAAR) 100 MG tablet; Take 1 tablet (100 mg total) by mouth daily.  Dispense: 90 tablet; Refill: 1  2. Hyperlipidemia with target LDL less than 100 Low fat diet - Lipid panel - fenofibrate 160 MG tablet; Take 1 tablet (160 mg total) by mouth daily.  Dispense: 90 tablet; Refill: 1  3. Type 2 diabetes mellitus without complication, without long-term current use of insulin (HCC) stricter watch carbs in diet Increased metformin from 561m bID to 10042mBID - Bayer DCA Hb A1c Waived - metFORMIN (GLUCOPHAGE XR) 1000 MG ; 2 tablet po BID  Dispense: 180 tablet; Refill: 1  4. BMI 30.0-30.9,adult Discussed diet and exercise for person with BMI >25 Will recheck weight in 3-6 months  5. Chronic midline low back pain with sciatica, sciatica laterality unspecified Continue to see pain management  6. Vitamin D deficiency  7. Fibromyalgia Continue to see pain management - cyclobenzaprine (FLEXERIL) 5 MG tablet; Take 1 tablet (5 mg total) by mouth 3 (three) times daily as needed for muscle spasms.  Dispense: 30 tablet; Refill: 3  8. Smoker encouraged to  stop smoking   Labs pending Health maintenance reviewed Diet and exercise encouraged Continue all meds Follow up  In 3 months   [place], FNP

## 2016-10-07 LAB — CMP14+EGFR
ALT: 89 IU/L — ABNORMAL HIGH (ref 0–44)
AST: 133 IU/L — ABNORMAL HIGH (ref 0–40)
Albumin/Globulin Ratio: 1.8 (ref 1.2–2.2)
Albumin: 4.7 g/dL (ref 3.6–4.8)
Alkaline Phosphatase: 82 IU/L (ref 39–117)
BUN/Creatinine Ratio: 21 (ref 10–24)
BUN: 16 mg/dL (ref 8–27)
Bilirubin Total: 0.4 mg/dL (ref 0.0–1.2)
CO2: 25 mmol/L (ref 18–29)
Calcium: 10.2 mg/dL (ref 8.6–10.2)
Chloride: 95 mmol/L — ABNORMAL LOW (ref 96–106)
Creatinine, Ser: 0.78 mg/dL (ref 0.76–1.27)
GFR calc Af Amer: 113 mL/min/{1.73_m2} (ref >59)
GFR calc non Af Amer: 97 mL/min/{1.73_m2} (ref >59)
Globulin, Total: 2.6 g/dL (ref 1.5–4.5)
Glucose: 264 mg/dL — ABNORMAL HIGH (ref 65–99)
Potassium: 4.8 mmol/L (ref 3.5–5.2)
Sodium: 139 mmol/L (ref 134–144)
Total Protein: 7.3 g/dL (ref 6.0–8.5)

## 2016-10-07 LAB — LIPID PANEL
Chol/HDL Ratio: 4.3 ratio units (ref 0.0–5.0)
Cholesterol, Total: 152 mg/dL (ref 100–199)
HDL: 35 mg/dL — ABNORMAL LOW (ref >39)
LDL Calculated: 52 mg/dL (ref 0–99)
Triglycerides: 324 mg/dL — ABNORMAL HIGH (ref 0–149)
VLDL Cholesterol Cal: 65 mg/dL — ABNORMAL HIGH (ref 5–40)

## 2016-10-14 DIAGNOSIS — G890 Central pain syndrome: Secondary | ICD-10-CM | POA: Diagnosis not present

## 2016-10-14 DIAGNOSIS — M25512 Pain in left shoulder: Secondary | ICD-10-CM | POA: Diagnosis not present

## 2016-10-14 DIAGNOSIS — M25511 Pain in right shoulder: Secondary | ICD-10-CM | POA: Diagnosis not present

## 2016-10-14 DIAGNOSIS — M542 Cervicalgia: Secondary | ICD-10-CM | POA: Diagnosis not present

## 2016-10-14 DIAGNOSIS — M79651 Pain in right thigh: Secondary | ICD-10-CM | POA: Diagnosis not present

## 2016-10-14 DIAGNOSIS — M79662 Pain in left lower leg: Secondary | ICD-10-CM | POA: Diagnosis not present

## 2016-10-14 DIAGNOSIS — M545 Low back pain: Secondary | ICD-10-CM | POA: Diagnosis not present

## 2016-10-14 DIAGNOSIS — Z79891 Long term (current) use of opiate analgesic: Secondary | ICD-10-CM | POA: Diagnosis not present

## 2016-10-14 DIAGNOSIS — M79661 Pain in right lower leg: Secondary | ICD-10-CM | POA: Diagnosis not present

## 2016-11-19 DIAGNOSIS — G894 Chronic pain syndrome: Secondary | ICD-10-CM | POA: Diagnosis not present

## 2016-11-19 DIAGNOSIS — M25511 Pain in right shoulder: Secondary | ICD-10-CM | POA: Diagnosis not present

## 2016-11-19 DIAGNOSIS — M79652 Pain in left thigh: Secondary | ICD-10-CM | POA: Diagnosis not present

## 2016-11-19 DIAGNOSIS — M25551 Pain in right hip: Secondary | ICD-10-CM | POA: Diagnosis not present

## 2016-11-19 DIAGNOSIS — M545 Low back pain: Secondary | ICD-10-CM | POA: Diagnosis not present

## 2016-11-19 DIAGNOSIS — M25552 Pain in left hip: Secondary | ICD-10-CM | POA: Diagnosis not present

## 2016-11-19 DIAGNOSIS — M542 Cervicalgia: Secondary | ICD-10-CM | POA: Diagnosis not present

## 2016-11-19 DIAGNOSIS — Z79891 Long term (current) use of opiate analgesic: Secondary | ICD-10-CM | POA: Diagnosis not present

## 2016-11-19 DIAGNOSIS — M25512 Pain in left shoulder: Secondary | ICD-10-CM | POA: Diagnosis not present

## 2016-11-26 ENCOUNTER — Other Ambulatory Visit: Payer: Self-pay | Admitting: *Deleted

## 2016-11-26 DIAGNOSIS — E785 Hyperlipidemia, unspecified: Secondary | ICD-10-CM

## 2016-11-26 MED ORDER — ROSUVASTATIN CALCIUM 10 MG PO TABS: 10.0000 mg | ORAL_TABLET | Freq: Every day | ORAL | 0 refills | Status: DC

## 2016-12-17 DIAGNOSIS — Z79891 Long term (current) use of opiate analgesic: Secondary | ICD-10-CM | POA: Diagnosis not present

## 2017-01-06 ENCOUNTER — Encounter: Payer: Self-pay | Admitting: Nurse Practitioner

## 2017-01-06 ENCOUNTER — Ambulatory Visit (INDEPENDENT_AMBULATORY_CARE_PROVIDER_SITE_OTHER): Payer: Medicare Other | Admitting: Nurse Practitioner

## 2017-01-06 VITALS — BP 131/85 | HR 91 | Temp 98.0°F | Ht 70.0 in | Wt 209.0 lb

## 2017-01-06 DIAGNOSIS — G8929 Other chronic pain: Secondary | ICD-10-CM | POA: Diagnosis not present

## 2017-01-06 DIAGNOSIS — E559 Vitamin D deficiency, unspecified: Secondary | ICD-10-CM | POA: Diagnosis not present

## 2017-01-06 DIAGNOSIS — F17200 Nicotine dependence, unspecified, uncomplicated: Secondary | ICD-10-CM | POA: Diagnosis not present

## 2017-01-06 DIAGNOSIS — Z6830 Body mass index (BMI) 30.0-30.9, adult: Secondary | ICD-10-CM

## 2017-01-06 DIAGNOSIS — Z23 Encounter for immunization: Secondary | ICD-10-CM

## 2017-01-06 DIAGNOSIS — E785 Hyperlipidemia, unspecified: Secondary | ICD-10-CM

## 2017-01-06 DIAGNOSIS — M5440 Lumbago with sciatica, unspecified side: Secondary | ICD-10-CM

## 2017-01-06 DIAGNOSIS — E119 Type 2 diabetes mellitus without complications: Secondary | ICD-10-CM

## 2017-01-06 DIAGNOSIS — I10 Essential (primary) hypertension: Secondary | ICD-10-CM | POA: Diagnosis not present

## 2017-01-06 DIAGNOSIS — M797 Fibromyalgia: Secondary | ICD-10-CM

## 2017-01-06 LAB — BAYER DCA HB A1C WAIVED: HB A1C (BAYER DCA - WAIVED): 7.3 % — ABNORMAL HIGH (ref <7.0)

## 2017-01-06 NOTE — Patient Instructions (Signed)
Steps to Quit Smoking Smoking tobacco can be bad for your health. It can also affect almost every organ in your body. Smoking puts you and people around you at risk for many serious long-lasting (chronic) diseases. Quitting smoking is hard, but it is one of the best things that you can do for your health. It is never too late to quit. What are the benefits of quitting smoking? When you quit smoking, you lower your risk for getting serious diseases and conditions. They can include:  Lung cancer or lung disease.  Heart disease.  Stroke.  Heart attack.  Not being able to have children (infertility).  Weak bones (osteoporosis) and broken bones (fractures). If you have coughing, wheezing, and shortness of breath, those symptoms may get better when you quit. You may also get sick less often. If you are pregnant, quitting smoking can help to lower your chances of having a baby of low birth weight. What can I do to help me quit smoking? Talk with your doctor about what can help you quit smoking. Some things you can do (strategies) include:  Quitting smoking totally, instead of slowly cutting back how much you smoke over a period of time.  Going to in-person counseling. You are more likely to quit if you go to many counseling sessions.  Using resources and support systems, such as:  Online chats with a counselor.  Phone quitlines.  Printed self-help materials.  Support groups or group counseling.  Text messaging programs.  Mobile phone apps or applications.  Taking medicines. Some of these medicines may have nicotine in them. If you are pregnant or breastfeeding, do not take any medicines to quit smoking unless your doctor says it is okay. Talk with your doctor about counseling or other things that can help you. Talk with your doctor about using more than one strategy at the same time, such as taking medicines while you are also going to in-person counseling. This can help make quitting  easier. What things can I do to make it easier to quit? Quitting smoking might feel very hard at first, but there is a lot that you can do to make it easier. Take these steps:  Talk to your family and friends. Ask them to support and encourage you.  Call phone quitlines, reach out to support groups, or work with a counselor.  Ask people who smoke to not smoke around you.  Avoid places that make you want (trigger) to smoke, such as:  Bars.  Parties.  Smoke-break areas at work.  Spend time with people who do not smoke.  Lower the stress in your life. Stress can make you want to smoke. Try these things to help your stress:  Getting regular exercise.  Deep-breathing exercises.  Yoga.  Meditating.  Doing a body scan. To do this, close your eyes, focus on one area of your body at a time from head to toe, and notice which parts of your body are tense. Try to relax the muscles in those areas.  Download or buy apps on your mobile phone or tablet that can help you stick to your quit plan. There are many free apps, such as QuitGuide from the CDC (Centers for Disease Control and Prevention). You can find more support from smokefree.gov and other websites. This information is not intended to replace advice given to you by your health care provider. Make sure you discuss any questions you have with your health care provider. Document Released: 08/23/2009 Document Revised: 06/24/2016 Document   Reviewed: 03/13/2015 Elsevier Interactive Patient Education  2017 Elsevier Inc.  

## 2017-01-06 NOTE — Progress Notes (Signed)
Subjective:    Patient ID: Michael Valenzuela, male    DOB: [birth year], 61 y.o.   MRN: [id number]  Patient here today for follow up of chronic medical problems. NO change since last visit. NO complaints today.  Outpatient Encounter Prescriptions as of 01/06/2017  Medication Sig  . aspirin EC 81 MG tablet Take 1 tablet (81 mg total) by mouth daily.  . cyclobenzaprine (FLEXERIL) 5 MG tablet Take 1 tablet (5 mg total) by mouth 3 (three) times daily as needed for muscle spasms.  . felodipine (PLENDIL) 5 MG 24 hr tablet Take 1 tablet (5 mg total) by mouth daily.  . fenofibrate 160 MG tablet Take 1 tablet (160 mg total) by mouth daily.  Marland Kitchen glucose blood test strip TrueTrack strips. Test BS qd. Dx E11.9  . hydrochlorothiazide (HYDRODIURIL) 25 MG tablet Take 1 tablet (25 mg total) by mouth daily.  Elmore Gu[employer] Devices (ACCU-CHEK SOFTCLIX) lancets Use as instructed  . losartan (COZAAR) 100 MG tablet Take 1 tablet (100 mg total) by mouth daily.  . metFORMIN (GLUCOPHAGE) 1000 MG tablet Take 1 tablet (1,000 mg total) by mouth 2 (two) times daily with a meal.  . oxyCODONE (ROXICODONE) 15 MG immediate release tablet Take 15 mg by mouth 4 (four) times daily.  . rosuvastatin (CRESTOR) 10 MG tablet Take 1 tablet (10 mg total) by mouth daily.   No facility-administered encounter medications on file as of 01/06/2017.     Hypertension  This is a chronic problem. The current episode started more than 1 year ago. The problem has been gradually improving since onset. The problem is controlled. Associated symptoms include neck pain. Pertinent negatives include no chest pain, headaches, palpitations or shortness of breath. Risk factors for coronary artery disease include male gender, smoking/tobacco exposure and dyslipidemia. Past treatments include diuretics. The current treatment provides moderate improvement. Compliance problems include diet and exercise.  There is no history of CAD/MI, CVA or PVD.  Hyperlipidemia  This is a  chronic problem. The current episode started more than 1 year ago. The problem is controlled. Recent lipid tests were reviewed and are high. Factors aggravating his hyperlipidemia include smoking. Pertinent negatives include no chest pain or shortness of breath. The current treatment provides mild improvement of lipids. Risk factors for coronary artery disease include male sex, hypertension and dyslipidemia.  Diabetes  Pertinent negatives for hypoglycemia include no headaches. Pertinent negatives for diabetes include no chest pain. Pertinent negatives for diabetic complications include no CVA or PVD. He is following a generally unhealthy diet. When asked about meal planning, he reported none. He has not had a previous visit with a dietitian. He never participates in exercise. Home blood sugar record trend: does ot check blood sugars consistently. His breakfast blood glucose is taken between 9-10 am. His breakfast blood glucose range is generally 130-140 mg/dl. His highest blood glucose is 180-200 mg/dl. His overall blood glucose range is 130-140 mg/dl. An ACE inhibitor/angiotensin II receptor blocker is being taken. He does not see a podiatrist.Eye exam is not current.  Chronic Low back pain-/fibromyalgia Dr. Lennox Grumbles pain management- has recently had 3 injections in back and neck which has helped some. Currently roxicodone QID. Patient has been seen out  In public on several occasions without limping like he does when he is in the office. He is still c/o increasing neck pain- he told Dr. Lennox Grumbles but they did not increase his pain meds- he says that flexeril helps some. depression Refuses treatment even though he scored high  on depression screen. Says that he does  Not like how the medicines make him feel. Depression screen [facility] 2/9 01/06/2017 10/06/2016 07/04/2016 04/01/2016 09/25/2015  Decreased Interest 2 0 3 2 2   Down, Depressed, Hopeless 2 0 3 2 2   PHQ - 2 Score 4 0 6 4 4   Altered sleeping 3 - 3 2 3   Tired,  decreased energy 3 - 3 2 3   Change in appetite 0 - 0 0 0  Feeling bad or failure about yourself  0 - 2 1 1   Trouble concentrating 0 - 1 1 1   Moving slowly or fidgety/restless 0 - 0 0 0  Suicidal thoughts 0 - 0 0 0  PHQ-9 Score 10 - 15 10 12   Some recent data might be hidden   Vitamin d def Suppose to be on Vitamin d OTC- but does not always remember to take  Review of Systems  Constitutional: Negative.   HENT: Negative.   Respiratory: Negative for shortness of breath.   Cardiovascular: Negative for chest pain and palpitations.  Musculoskeletal: Positive for arthralgias, back pain and neck pain.  Neurological: Negative for headaches.  Psychiatric/Behavioral: Negative.   All other systems reviewed and are negative.      Objective:   Physical Exam  Constitutional: He is oriented to person, place, and time. He appears well-developed and well-nourished.  HENT:  Head: Normocephalic.  Eyes: Conjunctivae are normal. Pupils are equal, round, and reactive to light.  Neck: Normal range of motion.  Cardiovascular: Normal rate and regular rhythm.   Pulmonary/Chest: Effort normal and breath sounds normal.  Abdominal: Soft.  Musculoskeletal: He exhibits tenderness (left antecubetal tenderness and bil leg burning sensation).  Still walking with slight left sided limp when in office.  Bends over to put shoes on without probems and was able to easily pick coat up out of floor without problems. (-) SLR bilaterally  Neurological: He is alert and oriented to person, place, and time. He has normal reflexes.  Skin: Skin is warm.  Psychiatric: He has a normal mood and affect. His behavior is normal. Judgment and thought content normal.   BP 131/85   Pulse 91   Temp 98 F (36.7 C) (Oral)   Ht 5' 10"  (1.778 m)   Wt 209 lb (94.8 kg)   BMI 29.99 kg/m      HGBA1c- 7.3% which is down  from 8.4% at last visit Assessment & Plan:  1. Essential hypertension, benign Low sodium diet -  CMP14+EGFR  2. Hyperlipidemia with target LDL less than 100 Low fat diet - Lipid panel  3. Type 2 diabetes mellitus without complication, without long-term current use of insulin (HCC) continnue to watch carbs- would like Hgba1c down a little more - Bayer DCA Hb A1c Waived  4. BMI 30.0-30.9,adult Discussed diet and exercise for person with BMI >25 Will recheck weight in 3-6 months  5. Chronic midline low back pain with sciatica, sciatica laterality unspecified  6. Fibromyalgia Keep follow up with pain management  7. Smoker Smoking cessation encouraged  8. Vitamin D deficiency    Labs pending Health maintenance reviewed Diet and exercise encouraged Continue all meds Follow up  In 3 months   [place], FNP

## 2017-01-06 NOTE — Addendum Note (Signed)
Addended by: Rolena Infante on: 01/06/2017 04:52 PM   Modules accepted: Orders

## 2017-01-07 LAB — LIPID PANEL
Chol/HDL Ratio: 5 ratio units (ref 0.0–5.0)
Cholesterol, Total: 161 mg/dL (ref 100–199)
HDL: 32 mg/dL — ABNORMAL LOW (ref >39)
Triglycerides: 721 mg/dL (ref 0–149)

## 2017-01-07 LAB — CMP14+EGFR
ALT: 51 IU/L — ABNORMAL HIGH (ref 0–44)
AST: 58 IU/L — ABNORMAL HIGH (ref 0–40)
Albumin/Globulin Ratio: 2 (ref 1.2–2.2)
Albumin: 4.7 g/dL (ref 3.6–4.8)
Alkaline Phosphatase: 64 IU/L (ref 39–117)
BUN/Creatinine Ratio: 18 (ref 10–24)
BUN: 15 mg/dL (ref 8–27)
Bilirubin Total: 0.3 mg/dL (ref 0.0–1.2)
CO2: 23 mmol/L (ref 18–29)
Calcium: 10.1 mg/dL (ref 8.6–10.2)
Chloride: 98 mmol/L (ref 96–106)
Creatinine, Ser: 0.85 mg/dL (ref 0.76–1.27)
GFR calc Af Amer: 109 mL/min/{1.73_m2} (ref >59)
GFR calc non Af Amer: 94 mL/min/{1.73_m2} (ref >59)
Globulin, Total: 2.3 g/dL (ref 1.5–4.5)
Glucose: 211 mg/dL — ABNORMAL HIGH (ref 65–99)
Potassium: 4.7 mmol/L (ref 3.5–5.2)
Sodium: 142 mmol/L (ref 134–144)
Total Protein: 7 g/dL (ref 6.0–8.5)

## 2017-01-13 DIAGNOSIS — Z79891 Long term (current) use of opiate analgesic: Secondary | ICD-10-CM | POA: Diagnosis not present

## 2017-01-13 DIAGNOSIS — M79604 Pain in right leg: Secondary | ICD-10-CM | POA: Diagnosis not present

## 2017-01-13 DIAGNOSIS — M25551 Pain in right hip: Secondary | ICD-10-CM | POA: Diagnosis not present

## 2017-01-13 DIAGNOSIS — M79662 Pain in left lower leg: Secondary | ICD-10-CM | POA: Diagnosis not present

## 2017-01-13 DIAGNOSIS — M5417 Radiculopathy, lumbosacral region: Secondary | ICD-10-CM | POA: Diagnosis not present

## 2017-01-13 DIAGNOSIS — M79661 Pain in right lower leg: Secondary | ICD-10-CM | POA: Diagnosis not present

## 2017-01-13 DIAGNOSIS — G894 Chronic pain syndrome: Secondary | ICD-10-CM | POA: Diagnosis not present

## 2017-01-13 DIAGNOSIS — M25552 Pain in left hip: Secondary | ICD-10-CM | POA: Diagnosis not present

## 2017-01-13 DIAGNOSIS — M79605 Pain in left leg: Secondary | ICD-10-CM | POA: Diagnosis not present

## 2017-02-11 ENCOUNTER — Other Ambulatory Visit: Payer: Self-pay

## 2017-02-11 DIAGNOSIS — M797 Fibromyalgia: Secondary | ICD-10-CM

## 2017-02-11 MED ORDER — CYCLOBENZAPRINE HCL 5 MG PO TABS: 5.0000 mg | ORAL_TABLET | Freq: Three times a day (TID) | ORAL | 3 refills | Status: DC | PRN

## 2017-02-26 ENCOUNTER — Other Ambulatory Visit: Payer: Self-pay

## 2017-02-26 ENCOUNTER — Other Ambulatory Visit: Payer: Self-pay | Admitting: *Deleted

## 2017-02-26 DIAGNOSIS — E785 Hyperlipidemia, unspecified: Secondary | ICD-10-CM

## 2017-02-26 MED ORDER — ROSUVASTATIN CALCIUM 10 MG PO TABS: 10.0000 mg | ORAL_TABLET | Freq: Every day | ORAL | 1 refills | Status: DC

## 2017-02-26 NOTE — Addendum Note (Signed)
Addended by: Antonietta Barcelona D on: 02/26/2017 02:48 PM   Modules accepted: Orders

## 2017-02-26 NOTE — Telephone Encounter (Signed)
Refill would not e-prescribe states invalid pharmacy Refill called into pharmacy

## 2017-03-16 DIAGNOSIS — M542 Cervicalgia: Secondary | ICD-10-CM | POA: Diagnosis not present

## 2017-03-16 DIAGNOSIS — M25512 Pain in left shoulder: Secondary | ICD-10-CM | POA: Diagnosis not present

## 2017-03-16 DIAGNOSIS — M79652 Pain in left thigh: Secondary | ICD-10-CM | POA: Diagnosis not present

## 2017-03-16 DIAGNOSIS — G894 Chronic pain syndrome: Secondary | ICD-10-CM | POA: Diagnosis not present

## 2017-03-16 DIAGNOSIS — M25552 Pain in left hip: Secondary | ICD-10-CM | POA: Diagnosis not present

## 2017-03-16 DIAGNOSIS — M25551 Pain in right hip: Secondary | ICD-10-CM | POA: Diagnosis not present

## 2017-03-16 DIAGNOSIS — M25511 Pain in right shoulder: Secondary | ICD-10-CM | POA: Diagnosis not present

## 2017-03-16 DIAGNOSIS — M545 Low back pain: Secondary | ICD-10-CM | POA: Diagnosis not present

## 2017-03-24 DIAGNOSIS — H5213 Myopia, bilateral: Secondary | ICD-10-CM | POA: Diagnosis not present

## 2017-03-24 DIAGNOSIS — H25093 Other age-related incipient cataract, bilateral: Secondary | ICD-10-CM | POA: Diagnosis not present

## 2017-03-24 DIAGNOSIS — H538 Other visual disturbances: Secondary | ICD-10-CM | POA: Diagnosis not present

## 2017-03-24 DIAGNOSIS — H524 Presbyopia: Secondary | ICD-10-CM | POA: Diagnosis not present

## 2017-03-24 LAB — HM DIABETES EYE EXAM

## 2017-04-07 ENCOUNTER — Ambulatory Visit (INDEPENDENT_AMBULATORY_CARE_PROVIDER_SITE_OTHER): Payer: Medicare Other | Admitting: Nurse Practitioner

## 2017-04-07 ENCOUNTER — Encounter: Payer: Self-pay | Admitting: Nurse Practitioner

## 2017-04-07 VITALS — BP 140/97 | HR 86 | Temp 97.4°F | Ht 70.0 in | Wt 210.0 lb

## 2017-04-07 DIAGNOSIS — E559 Vitamin D deficiency, unspecified: Secondary | ICD-10-CM

## 2017-04-07 DIAGNOSIS — E119 Type 2 diabetes mellitus without complications: Secondary | ICD-10-CM

## 2017-04-07 DIAGNOSIS — M5440 Lumbago with sciatica, unspecified side: Secondary | ICD-10-CM | POA: Diagnosis not present

## 2017-04-07 DIAGNOSIS — F17200 Nicotine dependence, unspecified, uncomplicated: Secondary | ICD-10-CM | POA: Diagnosis not present

## 2017-04-07 DIAGNOSIS — M797 Fibromyalgia: Secondary | ICD-10-CM | POA: Diagnosis not present

## 2017-04-07 DIAGNOSIS — I10 Essential (primary) hypertension: Secondary | ICD-10-CM | POA: Diagnosis not present

## 2017-04-07 DIAGNOSIS — G8929 Other chronic pain: Secondary | ICD-10-CM

## 2017-04-07 DIAGNOSIS — S8011XA Contusion of right lower leg, initial encounter: Secondary | ICD-10-CM

## 2017-04-07 DIAGNOSIS — E785 Hyperlipidemia, unspecified: Secondary | ICD-10-CM

## 2017-04-07 DIAGNOSIS — Z6830 Body mass index (BMI) 30.0-30.9, adult: Secondary | ICD-10-CM

## 2017-04-07 LAB — BAYER DCA HB A1C WAIVED: HB A1C (BAYER DCA - WAIVED): 7.2 % — ABNORMAL HIGH (ref <7.0)

## 2017-04-07 MED ORDER — FENOFIBRATE 160 MG PO TABS: 160.0000 mg | ORAL_TABLET | Freq: Every day | ORAL | 1 refills | Status: DC

## 2017-04-07 MED ORDER — HYDROCHLOROTHIAZIDE 25 MG PO TABS: 25.0000 mg | ORAL_TABLET | Freq: Every day | ORAL | 1 refills | Status: DC

## 2017-04-07 MED ORDER — FELODIPINE ER 5 MG PO TB24: 5.0000 mg | ORAL_TABLET | Freq: Every day | ORAL | 1 refills | Status: DC

## 2017-04-07 MED ORDER — LOSARTAN POTASSIUM 100 MG PO TABS: 100.0000 mg | ORAL_TABLET | Freq: Every day | ORAL | 1 refills | Status: DC

## 2017-04-07 MED ORDER — ROSUVASTATIN CALCIUM 10 MG PO TABS: 10.0000 mg | ORAL_TABLET | Freq: Every day | ORAL | 1 refills | Status: DC

## 2017-04-07 MED ORDER — METFORMIN HCL 1000 MG PO TABS: 1000.0000 mg | ORAL_TABLET | Freq: Two times a day (BID) | ORAL | 1 refills | Status: DC

## 2017-04-07 NOTE — Patient Instructions (Signed)
Contusion A contusion is a deep bruise. Contusions happen when an injury causes bleeding under the skin. Symptoms of bruising include pain, swelling, and discolored skin. The skin may turn blue, purple, or yellow. Follow these instructions at home:  Rest the injured area.  If told, put ice on the injured area.  Put ice in a plastic bag.  Place a towel between your skin and the bag.  Leave the ice on for 20 minutes, 2-3 times per day.  If told, put light pressure (compression) on the injured area using an elastic bandage. Make sure the bandage is not too tight. Remove it and put it back on as told by your doctor.  If possible, raise (elevate) the injured area above the level of your heart while you are sitting or lying down.  Take over-the-counter and prescription medicines only as told by your doctor. Contact a doctor if:  Your symptoms do not get better after several days of treatment.  Your symptoms get worse.  You have trouble moving the injured area. Get help right away if:  You have very bad pain.  You have a loss of feeling (numbness) in a hand or foot.  Your hand or foot turns pale or cold. This information is not intended to replace advice given to you by your health care provider. Make sure you discuss any questions you have with your health care provider. Document Released: 04/14/2008 Document Revised: 04/03/2016 Document Reviewed: 03/14/2015 Elsevier Interactive Patient Education  2017 Elsevier Inc.  

## 2017-04-07 NOTE — Progress Notes (Signed)
Subjective:    Patient ID: Michael Valenzuela, male    DOB: Jul 02, 1956, 61 y.o.   MRN: [id number]  HPI Jamir Rone is here today for follow up of chronic medical problem.  Outpatient Encounter Prescriptions as of 04/07/2017  Medication Sig  . aspirin EC 81 MG tablet Take 1 tablet (81 mg total) by mouth daily.  . cyclobenzaprine (FLEXERIL) 5 MG tablet Take 1 tablet (5 mg total) by mouth 3 (three) times daily as needed for muscle spasms.  . felodipine (PLENDIL) 5 MG 24 hr tablet Take 1 tablet (5 mg total) by mouth daily.  . fenofibrate 160 MG tablet Take 1 tablet (160 mg total) by mouth daily.  Marland Kitchen glucose blood test strip TrueTrack strips. Test BS qd. Dx E11.9  . hydrochlorothiazide (HYDRODIURIL) 25 MG tablet Take 1 tablet (25 mg total) by mouth daily.  Elmore Gu[employer] Devices (ACCU-CHEK SOFTCLIX) lancets Use as instructed  . losartan (COZAAR) 100 MG tablet Take 1 tablet (100 mg total) by mouth daily.  . metFORMIN (GLUCOPHAGE) 1000 MG tablet Take 1 tablet (1,000 mg total) by mouth 2 (two) times daily with a meal.  . oxyCODONE (ROXICODONE) 15 MG immediate release tablet Take 15 mg by mouth 4 (four) times daily.  . rosuvastatin (CRESTOR) 10 MG tablet Take 1 tablet (10 mg total) by mouth daily.   No facility-administered encounter medications on file as of 04/07/2017.     1. Essential hypertension, benign  Patient taking HCTZ, losartan and felodipine for management.  Patient does not check blood pressure daily.  2. Hyperlipidemia with target LDL less than 100  Patient manages with low fat diet and rosuvastatin and fenofibrate.  3. Type 2 diabetes mellitus without complication, without long-term current use of insulin ([place])  Patient takes metformin and checks blood glucose about 2-3x/week.  Fasting blood glucose runs 125-130.  4. Chronic midline low back pain with sciatica, sciatica laterality unspecified  Patient sees pain management clinic and takes oxycodone to manage pain.  5. BMI 30.0-30.9,adult  No  recent weight gain or loss.  6. Smoker  Patient continues to smoke.  7. Fibromyalgia Not able to exercise much due to back pain    New complaints: Patient has an area on the right shin where he states he was resting his leg on the corner of a car and within a couple hours, the area was significantly swollen and felt hard to touch.  Skin appears yellow over the area, but it is no longer swollen.    Review of Systems  Constitutional: Negative for activity change, appetite change and fatigue.  Respiratory: Negative for cough, shortness of breath and wheezing.   Cardiovascular: Negative for chest pain and palpitations.  Gastrointestinal: Negative for abdominal distention and abdominal pain.  Musculoskeletal: Positive for back pain (chronic).  Neurological: Negative for dizziness and headaches.  All other systems reviewed and are negative.      Objective:   Physical Exam  Constitutional: He is oriented to person, place, and time. He appears well-developed and well-nourished. No distress.  HENT:  Head: Normocephalic.  Right Ear: External ear normal.  Left Ear: External ear normal.  Nose: Nose normal.  Mouth/Throat: Oropharynx is clear and moist.  Eyes: Pupils are equal, round, and reactive to light.  Neck: Normal range of motion. Neck supple. No JVD present. No thyromegaly present.  Cardiovascular: Normal rate, regular rhythm, normal heart sounds and intact distal pulses.   No murmur heard. Pulmonary/Chest: Effort normal and breath sounds normal. He has no  wheezes. He has no rales.  Abdominal: Soft. Bowel sounds are normal. He exhibits no distension. There is no tenderness.  Musculoskeletal: Normal range of motion. He exhibits no edema.  Yellow slightly raised area on right shin   Lymphadenopathy:    He has no cervical adenopathy.  Neurological: He is alert and oriented to person, place, and time.  Skin: Skin is warm and dry. No rash noted.  Psychiatric: He has a normal mood  and affect. His behavior is normal. Judgment and thought content normal.   BP (!) 140/97   Pulse 86   Temp 97.4 F (36.3 C) (Oral)   Ht _0  (1.778 m)   Wt 210 lb (95.3 kg)   BMI 30.13 kg/m  HbA1C: 7.2%       Assessment & Plan:  1. Essential hypertension, benign Low sodium diet - CMP14+EGFR - felodipine (PLENDIL) 5 MG 24 hr tablet; Take 1 tablet (5 mg total) by mouth daily.  Dispense: 90 tablet; Refill: 1 - hydrochlorothiazide (HYDRODIURIL) 25 MG tablet; Take 1 tablet (25 mg total) by mouth daily.  Dispense: 90 tablet; Refill: 1 - losartan (COZAAR) 100 MG tablet; Take 1 tablet (100 mg total) by mouth daily.  Dispense: 90 tablet; Refill: 1  2. Hyperlipidemia with target LDL less than 100 Low ftat diet - Lipid panel - fenofibrate 160 MG tablet; Take 1 tablet (160 mg total) by mouth daily.  Dispense: 90 tablet; Refill: 1 - rosuvastatin (CRESTOR) 10 MG tablet; Take 1 tablet (10 mg total) by mouth daily.  Dispense: 90 tablet; Refill: 1  3. Type 2 diabetes mellitus without complication, without long-term current use of insulin (HCC) Strict cab counting - Bayer DCA Hb A1c Waived - metFORMIN (GLUCOPHAGE) 1000 MG tablet; Take 1 tablet (1,000 mg total) by mouth 2 (two) times daily with a meal.  Dispense: 180 tablet; Refill: 1  4. Chronic midline low back pain with sciatica, sciatica laterality unspecified Continue with pain management  5. BMI 30.0-30.9,adult Discussed diet and exercise for person with BMI >25 Will recheck weight in 3-6 months  6. Smoker smoking cessation discussed  7. Vitamin D deficiency  8. Fibromyalgia Exercise will help with muscle pain  9. Contusion of right lower leg, initial encounter Ice bid    Labs pending Health maintenance reviewed Diet and exercise encouraged Continue all meds Follow up  In 6 months   [place], FNP

## 2017-04-08 LAB — CMP14+EGFR
ALT: 57 IU/L — ABNORMAL HIGH (ref 0–44)
AST: 58 IU/L — ABNORMAL HIGH (ref 0–40)
Albumin/Globulin Ratio: 1.9 (ref 1.2–2.2)
Albumin: 4.8 g/dL (ref 3.6–4.8)
Alkaline Phosphatase: 65 IU/L (ref 39–117)
BUN/Creatinine Ratio: 15 (ref 10–24)
BUN: 13 mg/dL (ref 8–27)
Bilirubin Total: 0.3 mg/dL (ref 0.0–1.2)
CO2: 25 mmol/L (ref 18–29)
Calcium: 10.1 mg/dL (ref 8.6–10.2)
Chloride: 99 mmol/L (ref 96–106)
Creatinine, Ser: 0.85 mg/dL (ref 0.76–1.27)
GFR calc Af Amer: 109 mL/min/{1.73_m2} (ref >59)
GFR calc non Af Amer: 94 mL/min/{1.73_m2} (ref >59)
Globulin, Total: 2.5 g/dL (ref 1.5–4.5)
Glucose: 244 mg/dL — ABNORMAL HIGH (ref 65–99)
Potassium: 4.8 mmol/L (ref 3.5–5.2)
Sodium: 141 mmol/L (ref 134–144)
Total Protein: 7.3 g/dL (ref 6.0–8.5)

## 2017-04-08 LAB — LIPID PANEL
Chol/HDL Ratio: 4.1 ratio (ref 0.0–5.0)
Cholesterol, Total: 142 mg/dL (ref 100–199)
HDL: 35 mg/dL — ABNORMAL LOW (ref >39)
LDL Calculated: 40 mg/dL (ref 0–99)
Triglycerides: 336 mg/dL — ABNORMAL HIGH (ref 0–149)
VLDL Cholesterol Cal: 67 mg/dL — ABNORMAL HIGH (ref 5–40)

## 2017-05-18 DIAGNOSIS — M545 Low back pain: Secondary | ICD-10-CM | POA: Diagnosis not present

## 2017-05-18 DIAGNOSIS — M79661 Pain in right lower leg: Secondary | ICD-10-CM | POA: Diagnosis not present

## 2017-05-18 DIAGNOSIS — G894 Chronic pain syndrome: Secondary | ICD-10-CM | POA: Diagnosis not present

## 2017-05-18 DIAGNOSIS — M25551 Pain in right hip: Secondary | ICD-10-CM | POA: Diagnosis not present

## 2017-05-18 DIAGNOSIS — M79662 Pain in left lower leg: Secondary | ICD-10-CM | POA: Diagnosis not present

## 2017-05-18 DIAGNOSIS — M542 Cervicalgia: Secondary | ICD-10-CM | POA: Diagnosis not present

## 2017-05-18 DIAGNOSIS — M79604 Pain in right leg: Secondary | ICD-10-CM | POA: Diagnosis not present

## 2017-05-18 DIAGNOSIS — M79651 Pain in right thigh: Secondary | ICD-10-CM | POA: Diagnosis not present

## 2017-06-15 ENCOUNTER — Other Ambulatory Visit: Payer: Self-pay

## 2017-06-15 DIAGNOSIS — M797 Fibromyalgia: Secondary | ICD-10-CM

## 2017-06-15 MED ORDER — CYCLOBENZAPRINE HCL 5 MG PO TABS: 5.0000 mg | ORAL_TABLET | Freq: Three times a day (TID) | ORAL | 0 refills | Status: DC | PRN

## 2017-06-22 ENCOUNTER — Other Ambulatory Visit: Payer: Self-pay | Admitting: *Deleted

## 2017-06-22 DIAGNOSIS — M25511 Pain in right shoulder: Secondary | ICD-10-CM | POA: Diagnosis not present

## 2017-06-22 DIAGNOSIS — Z79891 Long term (current) use of opiate analgesic: Secondary | ICD-10-CM | POA: Diagnosis not present

## 2017-06-22 DIAGNOSIS — M791 Myalgia: Secondary | ICD-10-CM | POA: Diagnosis not present

## 2017-06-22 DIAGNOSIS — M542 Cervicalgia: Secondary | ICD-10-CM | POA: Diagnosis not present

## 2017-06-22 DIAGNOSIS — M79662 Pain in left lower leg: Secondary | ICD-10-CM | POA: Diagnosis not present

## 2017-06-22 DIAGNOSIS — G894 Chronic pain syndrome: Secondary | ICD-10-CM | POA: Diagnosis not present

## 2017-06-22 DIAGNOSIS — M25512 Pain in left shoulder: Secondary | ICD-10-CM | POA: Diagnosis not present

## 2017-06-22 DIAGNOSIS — M797 Fibromyalgia: Secondary | ICD-10-CM

## 2017-06-22 DIAGNOSIS — M79661 Pain in right lower leg: Secondary | ICD-10-CM | POA: Diagnosis not present

## 2017-06-22 MED ORDER — CYCLOBENZAPRINE HCL 5 MG PO TABS: 5.0000 mg | ORAL_TABLET | Freq: Three times a day (TID) | ORAL | 0 refills | Status: DC | PRN

## 2017-06-23 ENCOUNTER — Telehealth: Payer: Self-pay | Admitting: Nurse Practitioner

## 2017-06-23 NOTE — Telephone Encounter (Signed)
Closing encounter. Answered in another encounter. 

## 2017-07-20 DIAGNOSIS — M5417 Radiculopathy, lumbosacral region: Secondary | ICD-10-CM | POA: Diagnosis not present

## 2017-07-20 DIAGNOSIS — M79604 Pain in right leg: Secondary | ICD-10-CM | POA: Diagnosis not present

## 2017-07-20 DIAGNOSIS — G894 Chronic pain syndrome: Secondary | ICD-10-CM | POA: Diagnosis not present

## 2017-07-20 DIAGNOSIS — K5909 Other constipation: Secondary | ICD-10-CM | POA: Diagnosis not present

## 2017-07-20 DIAGNOSIS — G890 Central pain syndrome: Secondary | ICD-10-CM | POA: Diagnosis not present

## 2017-08-17 DIAGNOSIS — G894 Chronic pain syndrome: Secondary | ICD-10-CM | POA: Diagnosis not present

## 2017-08-17 DIAGNOSIS — Z79891 Long term (current) use of opiate analgesic: Secondary | ICD-10-CM | POA: Diagnosis not present

## 2017-08-17 DIAGNOSIS — M542 Cervicalgia: Secondary | ICD-10-CM | POA: Diagnosis not present

## 2017-08-17 DIAGNOSIS — M79662 Pain in left lower leg: Secondary | ICD-10-CM | POA: Diagnosis not present

## 2017-08-17 DIAGNOSIS — M791 Myalgia, unspecified site: Secondary | ICD-10-CM | POA: Diagnosis not present

## 2017-08-17 DIAGNOSIS — M79661 Pain in right lower leg: Secondary | ICD-10-CM | POA: Diagnosis not present

## 2017-08-17 DIAGNOSIS — M25512 Pain in left shoulder: Secondary | ICD-10-CM | POA: Diagnosis not present

## 2017-08-17 DIAGNOSIS — M25511 Pain in right shoulder: Secondary | ICD-10-CM | POA: Diagnosis not present

## 2017-09-15 DIAGNOSIS — M79662 Pain in left lower leg: Secondary | ICD-10-CM | POA: Diagnosis not present

## 2017-09-15 DIAGNOSIS — M25512 Pain in left shoulder: Secondary | ICD-10-CM | POA: Diagnosis not present

## 2017-09-15 DIAGNOSIS — M542 Cervicalgia: Secondary | ICD-10-CM | POA: Diagnosis not present

## 2017-09-15 DIAGNOSIS — M25511 Pain in right shoulder: Secondary | ICD-10-CM | POA: Diagnosis not present

## 2017-09-15 DIAGNOSIS — M79661 Pain in right lower leg: Secondary | ICD-10-CM | POA: Diagnosis not present

## 2017-09-15 DIAGNOSIS — G894 Chronic pain syndrome: Secondary | ICD-10-CM | POA: Diagnosis not present

## 2017-09-15 DIAGNOSIS — K5909 Other constipation: Secondary | ICD-10-CM | POA: Diagnosis not present

## 2017-10-08 ENCOUNTER — Encounter: Payer: Self-pay | Admitting: Nurse Practitioner

## 2017-10-08 ENCOUNTER — Ambulatory Visit (INDEPENDENT_AMBULATORY_CARE_PROVIDER_SITE_OTHER): Payer: Medicare Other | Admitting: Nurse Practitioner

## 2017-10-08 VITALS — BP 134/89 | HR 68 | Temp 97.6°F | Ht 70.0 in | Wt 206.0 lb

## 2017-10-08 DIAGNOSIS — Z6830 Body mass index (BMI) 30.0-30.9, adult: Secondary | ICD-10-CM | POA: Diagnosis not present

## 2017-10-08 DIAGNOSIS — M797 Fibromyalgia: Secondary | ICD-10-CM | POA: Diagnosis not present

## 2017-10-08 DIAGNOSIS — E785 Hyperlipidemia, unspecified: Secondary | ICD-10-CM | POA: Diagnosis not present

## 2017-10-08 DIAGNOSIS — Z23 Encounter for immunization: Secondary | ICD-10-CM | POA: Diagnosis not present

## 2017-10-08 DIAGNOSIS — E559 Vitamin D deficiency, unspecified: Secondary | ICD-10-CM

## 2017-10-08 DIAGNOSIS — M5440 Lumbago with sciatica, unspecified side: Secondary | ICD-10-CM | POA: Diagnosis not present

## 2017-10-08 DIAGNOSIS — G8929 Other chronic pain: Secondary | ICD-10-CM

## 2017-10-08 DIAGNOSIS — I10 Essential (primary) hypertension: Secondary | ICD-10-CM

## 2017-10-08 DIAGNOSIS — E119 Type 2 diabetes mellitus without complications: Secondary | ICD-10-CM | POA: Diagnosis not present

## 2017-10-08 DIAGNOSIS — F17200 Nicotine dependence, unspecified, uncomplicated: Secondary | ICD-10-CM

## 2017-10-08 LAB — BAYER DCA HB A1C WAIVED: HB A1C (BAYER DCA - WAIVED): 6.8 % (ref <7.0)

## 2017-10-08 MED ORDER — FENOFIBRATE 160 MG PO TABS: 160.0000 mg | ORAL_TABLET | Freq: Every day | ORAL | 1 refills | Status: DC

## 2017-10-08 MED ORDER — METFORMIN HCL 1000 MG PO TABS: 1000.0000 mg | ORAL_TABLET | Freq: Two times a day (BID) | ORAL | 1 refills | Status: DC

## 2017-10-08 MED ORDER — LOSARTAN POTASSIUM 100 MG PO TABS: 100.0000 mg | ORAL_TABLET | Freq: Every day | ORAL | 1 refills | Status: DC

## 2017-10-08 MED ORDER — HYDROCHLOROTHIAZIDE 25 MG PO TABS: 25.0000 mg | ORAL_TABLET | Freq: Every day | ORAL | 1 refills | Status: DC

## 2017-10-08 MED ORDER — CYCLOBENZAPRINE HCL 10 MG PO TABS: 10.0000 mg | ORAL_TABLET | Freq: Three times a day (TID) | ORAL | 1 refills | Status: DC | PRN

## 2017-10-08 MED ORDER — FELODIPINE ER 5 MG PO TB24: 5.0000 mg | ORAL_TABLET | Freq: Every day | ORAL | 1 refills | Status: DC

## 2017-10-08 MED ORDER — ROSUVASTATIN CALCIUM 10 MG PO TABS: 10.0000 mg | ORAL_TABLET | Freq: Every day | ORAL | 1 refills | Status: DC

## 2017-10-08 NOTE — Progress Notes (Signed)
Subjective:    Patient ID: Michael Valenzuela, male    DOB: 15-Mar-1956, 61 y.o.   MRN: [id number]  HPI  Michael Valenzuela is here today for follow up of chronic medical problem.  Outpatient Encounter Medications as of 10/08/2017  Medication Sig  . aspirin EC 81 MG tablet Take 1 tablet (81 mg total) by mouth daily.  . cyclobenzaprine (FLEXERIL) 5 MG tablet Take 1 tablet (5 mg total) by mouth 3 (three) times daily as needed for muscle spasms.  . felodipine (PLENDIL) 5 MG 24 hr tablet Take 1 tablet (5 mg total) by mouth daily.  . fenofibrate 160 MG tablet Take 1 tablet (160 mg total) by mouth daily.  Marland Kitchen glucose blood test strip TrueTrack strips. Test BS qd. Dx E11.9  . hydrochlorothiazide (HYDRODIURIL) 25 MG tablet Take 1 tablet (25 mg total) by mouth daily.  El[employer] Devices (ACCU-CHEK SOFTCLIX) lancets Use as instructed  . losartan (COZAAR) 100 MG tablet Take 1 tablet (100 mg total) by mouth daily.  . metFORMIN (GLUCOPHAGE) 1000 MG tablet Take 1 tablet (1,000 mg total) by mouth 2 (two) times daily with a meal.  . oxyCODONE (ROXICODONE) 15 MG immediate release tablet Take 15 mg by mouth 4 (four) times daily.  . rosuvastatin (CRESTOR) 10 MG tablet Take 1 tablet (10 mg total) by mouth daily.     1. Essential hypertension, benign  No c/o chest pain, SOB, and headache. Does not check blood pressures at home. BP Readings from Last 3 Encounters:  10/08/17 134/89  04/07/17 (!) 140/97  01/06/17 131/85     2. Hyperlipidemia with target LDL less than 100  Does not watch diet at home  3. Type 2 diabetes mellitus without complication, without long-term current use of insulin ([place]) last hgba1c 7.2%. Does not check blood sugars every day  4. Fibromyalgia  Has chronic pain and goes to pain clinic  5. Chronic midline low back pain with sciatica, sciatica laterality unspecified  Has chronic back pain- is now seeing pain management and is on oxycodone.  6. BMI 30.0-30.9,adult  No recent weight changes  7.  Smoker  Is still smoking- wife smokes also  8. Vitamin D deficiency  Does not always remember to take vitamin d  daily    New complaints: None today  Social history: Is on disability    Review of Systems  Constitutional: Negative for activity change and appetite change.  HENT: Negative.   Eyes: Negative for pain.  Respiratory: Negative for shortness of breath.   Cardiovascular: Negative for chest pain, palpitations and leg swelling.  Gastrointestinal: Negative for abdominal pain.  Endocrine: Negative for polydipsia.  Genitourinary: Negative.   Skin: Negative for rash.  Neurological: Negative for dizziness, weakness and headaches.  Hematological: Does not bruise/bleed easily.  Psychiatric/Behavioral: Negative.   All other systems reviewed and are negative.      Objective:   Physical Exam  Constitutional: He is oriented to person, place, and time. He appears well-developed and well-nourished.  HENT:  Head: Normocephalic.  Right Ear: External ear normal.  Left Ear: External ear normal.  Nose: Nose normal.  Mouth/Throat: Oropharynx is clear and moist.  Eyes: EOM are normal. Pupils are equal, round, and reactive to light.  Neck: Normal range of motion. Neck supple. No JVD present. No thyromegaly present.  Cardiovascular: Normal rate, regular rhythm, normal heart sounds and intact distal pulses. Exam reveals no gallop and no friction rub.  No murmur heard. Pulmonary/Chest: Effort normal and breath sounds normal.  No respiratory distress. He has no wheezes. He has no rales. He exhibits no tenderness.  Abdominal: Soft. Bowel sounds are normal. He exhibits no mass. There is no tenderness.  Genitourinary: Prostate normal and penis normal.  Musculoskeletal: Normal range of motion. He exhibits no edema.  Lymphadenopathy:    He has no cervical adenopathy.  Neurological: He is alert and oriented to person, place, and time. No cranial nerve deficit.  Skin: Skin is warm and dry.    Psychiatric: He has a normal mood and affect. His behavior is normal. Judgment and thought content normal.   BP 134/89   Pulse 68   Temp 97.6 F (36.4 C) (Oral)   Ht 5' 10"  (1.778 m)   Wt 206 lb (93.4 kg)   BMI 29.56 kg/m   hgba1c 6.8%     Assessment & Plan:  1. Essential hypertension, benign Low sodium idet - CMP14+EGFR - felodipine (PLENDIL) 5 MG 24 hr tablet; Take 1 tablet (5 mg total) by mouth daily.  Dispense: 90 tablet; Refill: 1 - hydrochlorothiazide (HYDRODIURIL) 25 MG tablet; Take 1 tablet (25 mg total) by mouth daily.  Dispense: 90 tablet; Refill: 1 - losartan (COZAAR) 100 MG tablet; Take 1 tablet (100 mg total) by mouth daily.  Dispense: 90 tablet; Refill: 1  2. Hyperlipidemia with target LDL less than 100 Low fat diet - Lipid panel - fenofibrate 160 MG tablet; Take 1 tablet (160 mg total) by mouth daily.  Dispense: 90 tablet; Refill: 1 - rosuvastatin (CRESTOR) 10 MG tablet; Take 1 tablet (10 mg total) by mouth daily.  Dispense: 90 tablet; Refill: 1  3. Type 2 diabetes mellitus without complication, without long-term current use of insulin (HCC) Continue to watch carbs in diet - Bayer DCA Hb A1c Waived - Microalbumin / creatinine urine ratio - metFORMIN (GLUCOPHAGE) 1000 MG tablet; Take 1 tablet (1,000 mg total) by mouth 2 (two) times daily with a meal.  Dispense: 180 tablet; Refill: 1  4. Fibromyalgia Keep follow up with pain management Continue to do daily exercises to keep muscles warm  5. Chronic midline low back pain with sciatica, sciatica laterality unspecified Keep follow up with pain management  6. BMI 30.0-30.9,adult Discussed diet and exercise for person with BMI >25 Will recheck weight in 3-6 months  7. Smoker Smoking cessation encouraged  8. Vitamin D deficiency    Labs pending Health maintenance reviewed Diet and exercise encouraged Continue all meds Follow up  In 6 months   [place], FNP

## 2017-10-08 NOTE — Patient Instructions (Signed)
Myofascial Pain Syndrome and Fibromyalgia Myofascial pain syndrome and fibromyalgia are both pain disorders. This pain may be felt mainly in your muscles.  Myofascial pain syndrome: ? Always has trigger points or tender points in the muscle that will cause pain when pressed. The pain may come and go. ? Usually affects your neck, upper back, and shoulder areas. The pain often radiates into your arms and hands.  Fibromyalgia: ? Has muscle pains and tenderness that come and go. ? Is often associated with fatigue and sleep disturbances. ? Has trigger points. ? Tends to be long-lasting (chronic), but is not life-threatening.  Fibromyalgia and myofascial pain are not the same. However, they often occur together. If you have both conditions, each can make the other worse. Both are common and can cause enough pain and fatigue to make day-to-day activities difficult. What are the causes? The exact causes of fibromyalgia and myofascial pain are not known. People with certain gene types may be more likely to develop fibromyalgia. Some factors can be triggers for both conditions, such as:  Spine disorders.  Arthritis.  Severe injury (trauma) and other physical stressors.  Being under a lot of stress.  A medical illness.  What are the signs or symptoms? Fibromyalgia The main symptom of fibromyalgia is widespread pain and tenderness in your muscles. This can vary over time. Pain is sometimes described as stabbing, shooting, or burning. You may have tingling or numbness, too. You may also have sleep problems and fatigue. You may wake up feeling tired and groggy (fibro fog). Other symptoms may include:  Bowel and bladder problems.  Headaches.  Visual problems.  Problems with odors and noises.  Depression or mood changes.  Painful menstrual periods (dysmenorrhea).  Dry skin or eyes.  Myofascial pain syndrome Symptoms of myofascial pain syndrome include:  Tight, ropy bands of  muscle.  Uncomfortable sensations in muscular areas, such as: ? Aching. ? Cramping. ? Burning. ? Numbness. ? Tingling. ? Muscle weakness.  Trouble moving certain muscles freely (range of motion).  How is this diagnosed? There are no specific tests to diagnose fibromyalgia or myofascial pain syndrome. Both can be hard to diagnose because their symptoms are common in many other conditions. Your health care provider may suspect one or both of these conditions based on your symptoms and medical history. Your health care provider will also do a physical exam. The key to diagnosing fibromyalgia is having pain, fatigue, and other symptoms for more than three months that cannot be explained by another condition. The key to diagnosing myofascial pain syndrome is finding trigger points in muscles that are tender and cause pain elsewhere in your body (referred pain). How is this treated? Treating fibromyalgia and myofascial pain often requires a team of health care providers. This usually starts with your primary provider and a physical therapist. You may also find it helpful to work with alternative health care providers, such as massage therapists or acupuncturists. Treatment for fibromyalgia may include medicines. This may include nonsteroidal anti-inflammatory drugs (NSAIDs), along with other medicines. Treatment for myofascial pain may also include:  NSAIDs.  Cooling and stretching of muscles.  Trigger point injections.  Sound wave (ultrasound) treatments to stimulate muscles.  Follow these instructions at home:  Take medicines only as directed by your health care provider.  Exercise as directed by your health care provider or physical therapist.  Try to avoid stressful situations.  Practice relaxation techniques to control your stress. You may want to try: ? Biofeedback. ? Visual   imagery. ? Hypnosis. ? Muscle relaxation. ? Yoga. ? Meditation.  Talk to your health care provider  about alternative treatments, such as acupuncture or massage treatment.  Maintain a healthy lifestyle. This includes eating a healthy diet and getting enough sleep.  Consider joining a support group.  Do not do activities that stress or strain your muscles. That includes repetitive motions and heavy lifting. Where to find more information:  National Fibromyalgia Association: www.fmaware.org  Arthritis Foundation: www.arthritis.org  American Chronic Pain Association: www.theacpa.org/condition/myofascial-pain Contact a health care provider if:  You have new symptoms.  Your symptoms get worse.  You have side effects from your medicines.  You have trouble sleeping.  Your condition is causing depression or anxiety. This information is not intended to replace advice given to you by your health care provider. Make sure you discuss any questions you have with your health care provider. Document Released: 10/27/2005 Document Revised: 04/03/2016 Document Reviewed: 08/02/2014 Elsevier Interactive Patient Education  2018 Elsevier Inc.  

## 2017-10-09 LAB — CMP14+EGFR
ALT: 53 IU/L — ABNORMAL HIGH (ref 0–44)
AST: 55 IU/L — ABNORMAL HIGH (ref 0–40)
Albumin/Globulin Ratio: 2.3 — ABNORMAL HIGH (ref 1.2–2.2)
Albumin: 4.8 g/dL (ref 3.6–4.8)
Alkaline Phosphatase: 53 IU/L (ref 39–117)
BUN/Creatinine Ratio: 21 (ref 10–24)
BUN: 18 mg/dL (ref 8–27)
Bilirubin Total: 0.2 mg/dL (ref 0.0–1.2)
CO2: 25 mmol/L (ref 20–29)
Calcium: 10.6 mg/dL — ABNORMAL HIGH (ref 8.6–10.2)
Chloride: 98 mmol/L (ref 96–106)
Creatinine, Ser: 0.86 mg/dL (ref 0.76–1.27)
GFR calc Af Amer: 107 mL/min/{1.73_m2} (ref >59)
GFR calc non Af Amer: 93 mL/min/{1.73_m2} (ref >59)
Globulin, Total: 2.1 g/dL (ref 1.5–4.5)
Glucose: 143 mg/dL — ABNORMAL HIGH (ref 65–99)
Potassium: 4.3 mmol/L (ref 3.5–5.2)
Sodium: 141 mmol/L (ref 134–144)
Total Protein: 6.9 g/dL (ref 6.0–8.5)

## 2017-10-09 LAB — LIPID PANEL
Chol/HDL Ratio: 4.2 ratio (ref 0.0–5.0)
Cholesterol, Total: 134 mg/dL (ref 100–199)
HDL: 32 mg/dL — ABNORMAL LOW (ref >39)
Triglycerides: 416 mg/dL — ABNORMAL HIGH (ref 0–149)

## 2017-10-15 DIAGNOSIS — G603 Idiopathic progressive neuropathy: Secondary | ICD-10-CM | POA: Diagnosis not present

## 2017-10-15 DIAGNOSIS — M542 Cervicalgia: Secondary | ICD-10-CM | POA: Diagnosis not present

## 2017-10-15 DIAGNOSIS — G894 Chronic pain syndrome: Secondary | ICD-10-CM | POA: Diagnosis not present

## 2017-12-14 DIAGNOSIS — M542 Cervicalgia: Secondary | ICD-10-CM | POA: Diagnosis not present

## 2017-12-14 DIAGNOSIS — G894 Chronic pain syndrome: Secondary | ICD-10-CM | POA: Diagnosis not present

## 2017-12-14 DIAGNOSIS — M79621 Pain in right upper arm: Secondary | ICD-10-CM | POA: Diagnosis not present

## 2017-12-14 DIAGNOSIS — M79622 Pain in left upper arm: Secondary | ICD-10-CM | POA: Diagnosis not present

## 2017-12-14 DIAGNOSIS — M79661 Pain in right lower leg: Secondary | ICD-10-CM | POA: Diagnosis not present

## 2017-12-14 DIAGNOSIS — M545 Low back pain: Secondary | ICD-10-CM | POA: Diagnosis not present

## 2017-12-14 DIAGNOSIS — M79662 Pain in left lower leg: Secondary | ICD-10-CM | POA: Diagnosis not present

## 2017-12-14 DIAGNOSIS — M25511 Pain in right shoulder: Secondary | ICD-10-CM | POA: Diagnosis not present

## 2017-12-14 DIAGNOSIS — M25512 Pain in left shoulder: Secondary | ICD-10-CM | POA: Diagnosis not present

## 2017-12-31 ENCOUNTER — Ambulatory Visit (INDEPENDENT_AMBULATORY_CARE_PROVIDER_SITE_OTHER): Payer: Medicare Other | Admitting: *Deleted

## 2017-12-31 ENCOUNTER — Encounter: Payer: Self-pay | Admitting: *Deleted

## 2017-12-31 VITALS — BP 151/89 | HR 79 | Ht 70.0 in | Wt 205.0 lb

## 2017-12-31 DIAGNOSIS — Z0000 Encounter for general adult medical examination without abnormal findings: Secondary | ICD-10-CM | POA: Diagnosis not present

## 2017-12-31 NOTE — Progress Notes (Addendum)
Subjective:   Michael Valenzuela is a 62 y.o. male who presents for an Initial Medicare Annual Wellness Visit. Michael Valenzuela is disabled and worked at [employer]. He has two adult sons and one grandchild. 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   Review of Systems  Health is about the same as last year  Cardiac Risk Factors include: advanced age (>52men, >40 women);sedentary lifestyle;smoking/ tobacco exposure;hypertension;dyslipidemia;diabetes mellitus;male gender;obesity (BMI >30kg/m2)  Musc: generalized pain due fibormyalgia   Objective:    Today's Vitals   12/31/17 0844 12/31/17 0845  BP: (!) 151/89   Pulse: 79   Weight: 205 lb (93 kg)   Height: 5\' 10"  (1.778 m)   PainSc:  8    Body mass index is 29.41 kg/m.  Advanced Directives 12/31/2017 09/11/2014  Does Patient Have a Medical Advance Directive? No No  Would patient like information on creating a medical advance directive? No - Patient declined Yes - Educational materials given    Current Medications (verified) Outpatient Encounter Medications as of 12/31/2017  Medication Sig  . aspirin EC 81 MG tablet Take 1 tablet (81 mg total) by mouth daily.  . cyclobenzaprine (FLEXERIL) 10 MG tablet Take 1 tablet (10 mg total) by mouth 3 (three) times daily as needed for muscle spasms.  . felodipine (PLENDIL) 5 MG 24 hr tablet Take 1 tablet (5 mg total) by mouth daily.  . fenofibrate 160 MG tablet Take 1 tablet (160 mg total) by mouth daily.  Marland Kitchen glucose blood test strip TrueTrack strips. Test BS qd. Dx E11.9  . hydrochlorothiazide (HYDRODIURIL) 25 MG tablet Take 1 tablet (25 mg total) by mouth daily.  Elmore Guise Devices (ACCU-CHEK SOFTCLIX) lancets Use as instructed  . losartan (COZAAR) 100 MG tablet Take 1 tablet (100 mg total) by mouth daily.  . metFORMIN (GLUCOPHAGE) 1000 MG tablet Take 1 tablet (1,000 mg total) by mouth 2 (two) times daily with a meal.  . oxyCODONE (ROXICODONE)  15 MG immediate release tablet Take 15 mg by mouth 4 (four) times daily.  . rosuvastatin (CRESTOR) 10 MG tablet Take 1 tablet (10 mg total) by mouth daily.   No facility-administered encounter medications on file as of 12/31/2017.     Allergies (verified) Patient has no known allergies.   History: Past Medical History:  Diagnosis Date  . Chronic low back pain   . Diabetes mellitus without complication ([place])   . Fibromyalgia   . Hyperlipidemia   . Hypertension   . Vitamin D deficiency    Past Surgical History:  Procedure Laterality Date  . HERNIA REPAIR     Family History  Problem Relation Age of Onset  . Heart disease Mother   . Diabetes Mother   . Cancer Mother   . Heart disease Father   . Diabetes Son 15       type 1  . Diabetes Paternal Uncle   . Heart attack Brother   . Hypertension Son    Social History   Socioeconomic History  . Marital status: Married    Spouse name: None  . Number of children: 2  . Years of education: 57  . Highest education level: Some college, no degree  Social Needs  . Financial resource strain: Not hard at all  . Food insecurity - worry: Never true  . Food insecurity - inability: Never true  . Transportation needs - medical: No  . Transportation needs -  non-medical: No  Occupational History  . Occupation: Retired    Fish farm manager: [employer]  Tobacco Use  . Smoking status: Current Every Day Smoker    Packs/day: 0.50  . Smokeless tobacco: Never Used  Substance and Sexual Activity  . Alcohol use: Yes    Comment: occasional  . Drug use: No  . Sexual activity: Not Currently  Other Topics Concern  . None  Social History Narrative   Patient has fibromyalgia and has significant pain with that. He has two adult children and is married. He has one grandchild.    Tobacco Counseling Ready to quit: No Counseling given: No   Clinical Intake:     Pain : 0-10 Pain Score: 8  Pain Location: Generalized Pain Descriptors /  Indicators: Aching Pain Onset: More than a month ago Pain Frequency: Constant Pain Relieving Factors: Nothing helps. Patient is taking oxycodone 15 mg QID Effect of Pain on Daily Activities: significant effect. Patient pushes himself to do what he needs to  Pain Relieving Factors: Nothing helps. Patient is taking oxycodone 15 mg QID     How often do you need to have someone help you when you read instructions, pamphlets, or other written materials from your doctor or pharmacy?: 1 - Never What is the last grade level you completed in school?: some college  Interpreter Needed?: No  Information entered by :: Chong Sicilian, RN  Activities of Daily Living In your present state of health, do you have any difficulty performing the following activities: 12/31/2017  Hearing? N  Vision? N  Comment last exam was around 07/2017. Sees Dr [facility]  Difficulty concentrating or making decisions? Y  Comment Has noticed some worsening of short term memory  Walking or climbing stairs? N  Dressing or bathing? N  Doing errands, shopping? N  Preparing Food and eating ? N  Using the Toilet? N  In the past six months, have you accidently leaked urine? N  Do you have problems with loss of bowel control? N  Managing your Medications? N  Comment uses pill box  Managing your Finances? N  Housekeeping or managing your Housekeeping? N  Some recent data might be hidden     Immunizations and Health Maintenance Immunization History  Administered Date(s) Administered  . Influenza,inj,Quad PF,6+ Mos 10/06/2016, 10/08/2017  . Pneumococcal Conjugate-13 12/31/2015  . Pneumococcal Polysaccharide-23 01/06/2017  . Tdap 03/19/2015  . Tetanus 06/25/2004  . Zoster 09/25/2015   Health Maintenance Due  Topic Date Due  . OPHTHALMOLOGY EXAM  03/20/2017    Patient Care Team: Chevis Pretty, FNP as PCP - General (Nurse Practitioner) [facility] ([facility]  [facility])  No hospitalizations, ER visits, or surgeries this past year.      Assessment:   This is a routine wellness examination for Michael Valenzuela.  Hearing/Vision screen No deficits noted during visit.   Dietary issues and exercise activities discussed: Current Exercise Habits: The patient does not participate in regular exercise at present, Exercise limited by: orthopedic condition(s);neurologic condition(s)(fibromyalgia)  Goals    . Exercise 150 min/wk Moderate Activity      Depression Screen PHQ 2/9 Scores 12/31/2017 10/08/2017 04/07/2017 01/06/2017  PHQ - 2 Score 4 1 2 4   PHQ- 9 Score 10 - 9 10  Exception Documentation Patient refusal - - -    Fall Risk Fall Risk  12/31/2017 10/08/2017 04/07/2017 01/06/2017 10/06/2016  Falls in the past year? No No No No No   Cognitive Function: MMSE -  Mini Mental State Exam 12/31/2017  Orientation to time 5  Orientation to Place 5  Registration 3  Attention/ Calculation 4  Recall 2  Language- name 2 objects 2  Language- repeat 1  Language- follow 3 step command 3  Language- read & follow direction 1  Write a sentence 1  Copy design 0  Total score 27        Screening Tests Health Maintenance  Topic Date Due  . OPHTHALMOLOGY EXAM  03/20/2017  . HIV Screening  03/18/2018 (Originally 06/19/1971)  . HEMOGLOBIN A1C  04/07/2018  . FOOT EXAM  10/08/2018  . PNEUMOCOCCAL POLYSACCHARIDE VACCINE (2) 01/06/2022  . COLONOSCOPY  03/17/2022  . TETANUS/TDAP  03/18/2025  . INFLUENZA VACCINE  Completed  . Hepatitis C Screening  Completed       Plan:   Keep f/u with PCP Do daily stretches and increase activity level as tolerated. Aim for 150 min of moderate activity weekly   I have personally reviewed and noted the following in the patient's chart:   . Medical and social history . Use of alcohol, tobacco or illicit drugs  . Current medications and supplements . Functional ability and status . Nutritional status . Physical  activity . Advanced directives . List of other physicians . Hospitalizations, surgeries, and ER visits in previous 12 months . Vitals . Screenings to include cognitive, depression, and falls . Referrals and appointments  In addition, I have reviewed and discussed with patient certain preventive protocols, quality metrics, and best practice recommendations. A written personalized care plan for preventive services as well as general preventive health recommendations were provided to patient.     Chong Sicilian, RN   01/01/2018   I have reviewed and agree with the above AWV documentation.   [name] Done, FNP

## 2017-12-31 NOTE — Patient Instructions (Addendum)
  Michael Valenzuela , Thank you for taking time to come for your Medicare Wellness Visit. I appreciate your ongoing commitment to your health goals. Please review the following plan we discussed and let me know if I can assist you in the future.    These are the goals we discussed: Do stretches daily  This is a list of the screening recommended for you and due dates:  Health Maintenance  Topic Date Due  . Eye exam for diabetics  03/20/2017  . HIV Screening  03/18/2018*  . Hemoglobin A1C  04/07/2018  . Complete foot exam   10/08/2018  . Pneumococcal vaccine (2) 01/06/2022  . Colon Cancer Screening  03/17/2022  . Tetanus Vaccine  03/18/2025  . Flu Shot  Completed  .  Hepatitis C: One time screening is recommended by Center for Disease Control  (CDC) for  adults born from [birth year] through 1966.   Completed  *Topic was postponed. The date shown is not the original due date.

## 2018-01-11 DIAGNOSIS — M79662 Pain in left lower leg: Secondary | ICD-10-CM | POA: Diagnosis not present

## 2018-01-11 DIAGNOSIS — G894 Chronic pain syndrome: Secondary | ICD-10-CM | POA: Diagnosis not present

## 2018-01-11 DIAGNOSIS — M542 Cervicalgia: Secondary | ICD-10-CM | POA: Diagnosis not present

## 2018-01-11 DIAGNOSIS — M79661 Pain in right lower leg: Secondary | ICD-10-CM | POA: Diagnosis not present

## 2018-01-11 DIAGNOSIS — M25511 Pain in right shoulder: Secondary | ICD-10-CM | POA: Diagnosis not present

## 2018-01-11 DIAGNOSIS — M25512 Pain in left shoulder: Secondary | ICD-10-CM | POA: Diagnosis not present

## 2018-01-11 DIAGNOSIS — M545 Low back pain: Secondary | ICD-10-CM | POA: Diagnosis not present

## 2018-02-02 ENCOUNTER — Other Ambulatory Visit: Payer: Self-pay | Admitting: *Deleted

## 2018-02-02 MED ORDER — CYCLOBENZAPRINE HCL 10 MG PO TABS: 10.0000 mg | ORAL_TABLET | Freq: Three times a day (TID) | ORAL | 1 refills | Status: DC | PRN

## 2018-02-10 DIAGNOSIS — M79661 Pain in right lower leg: Secondary | ICD-10-CM | POA: Diagnosis not present

## 2018-02-10 DIAGNOSIS — G894 Chronic pain syndrome: Secondary | ICD-10-CM | POA: Diagnosis not present

## 2018-02-10 DIAGNOSIS — M25511 Pain in right shoulder: Secondary | ICD-10-CM | POA: Diagnosis not present

## 2018-02-10 DIAGNOSIS — M542 Cervicalgia: Secondary | ICD-10-CM | POA: Diagnosis not present

## 2018-02-10 DIAGNOSIS — M797 Fibromyalgia: Secondary | ICD-10-CM | POA: Diagnosis not present

## 2018-02-10 DIAGNOSIS — M79662 Pain in left lower leg: Secondary | ICD-10-CM | POA: Diagnosis not present

## 2018-02-10 DIAGNOSIS — M25512 Pain in left shoulder: Secondary | ICD-10-CM | POA: Diagnosis not present

## 2018-02-10 DIAGNOSIS — M545 Low back pain: Secondary | ICD-10-CM | POA: Diagnosis not present

## 2018-03-12 DIAGNOSIS — G894 Chronic pain syndrome: Secondary | ICD-10-CM | POA: Diagnosis not present

## 2018-03-12 DIAGNOSIS — M25511 Pain in right shoulder: Secondary | ICD-10-CM | POA: Diagnosis not present

## 2018-03-12 DIAGNOSIS — M25512 Pain in left shoulder: Secondary | ICD-10-CM | POA: Diagnosis not present

## 2018-03-12 DIAGNOSIS — M79662 Pain in left lower leg: Secondary | ICD-10-CM | POA: Diagnosis not present

## 2018-03-12 DIAGNOSIS — M797 Fibromyalgia: Secondary | ICD-10-CM | POA: Diagnosis not present

## 2018-03-12 DIAGNOSIS — M545 Low back pain: Secondary | ICD-10-CM | POA: Diagnosis not present

## 2018-03-12 DIAGNOSIS — M542 Cervicalgia: Secondary | ICD-10-CM | POA: Diagnosis not present

## 2018-03-12 DIAGNOSIS — M79661 Pain in right lower leg: Secondary | ICD-10-CM | POA: Diagnosis not present

## 2018-04-08 ENCOUNTER — Ambulatory Visit (INDEPENDENT_AMBULATORY_CARE_PROVIDER_SITE_OTHER): Payer: Medicare Other | Admitting: Nurse Practitioner

## 2018-04-08 ENCOUNTER — Encounter: Payer: Self-pay | Admitting: Nurse Practitioner

## 2018-04-08 VITALS — BP 139/86 | HR 80 | Temp 98.2°F | Ht 70.0 in | Wt 210.0 lb

## 2018-04-08 DIAGNOSIS — G8929 Other chronic pain: Secondary | ICD-10-CM | POA: Diagnosis not present

## 2018-04-08 DIAGNOSIS — M5440 Lumbago with sciatica, unspecified side: Secondary | ICD-10-CM

## 2018-04-08 DIAGNOSIS — I10 Essential (primary) hypertension: Secondary | ICD-10-CM

## 2018-04-08 DIAGNOSIS — E119 Type 2 diabetes mellitus without complications: Secondary | ICD-10-CM | POA: Diagnosis not present

## 2018-04-08 DIAGNOSIS — E785 Hyperlipidemia, unspecified: Secondary | ICD-10-CM | POA: Diagnosis not present

## 2018-04-08 DIAGNOSIS — F17200 Nicotine dependence, unspecified, uncomplicated: Secondary | ICD-10-CM

## 2018-04-08 DIAGNOSIS — Z6830 Body mass index (BMI) 30.0-30.9, adult: Secondary | ICD-10-CM

## 2018-04-08 DIAGNOSIS — M797 Fibromyalgia: Secondary | ICD-10-CM

## 2018-04-08 DIAGNOSIS — E559 Vitamin D deficiency, unspecified: Secondary | ICD-10-CM

## 2018-04-08 LAB — LIPID PANEL
Chol/HDL Ratio: 4.5 ratio (ref 0.0–5.0)
Cholesterol, Total: 147 mg/dL (ref 100–199)
HDL: 33 mg/dL — ABNORMAL LOW (ref >39)
Triglycerides: 516 mg/dL — ABNORMAL HIGH (ref 0–149)

## 2018-04-08 LAB — CMP14+EGFR
ALT: 48 IU/L — ABNORMAL HIGH (ref 0–44)
AST: 47 IU/L — ABNORMAL HIGH (ref 0–40)
Albumin/Globulin Ratio: 2.2 (ref 1.2–2.2)
Albumin: 4.6 g/dL (ref 3.6–4.8)
Alkaline Phosphatase: 69 IU/L (ref 39–117)
BUN/Creatinine Ratio: 16 (ref 10–24)
BUN: 15 mg/dL (ref 8–27)
Bilirubin Total: 0.3 mg/dL (ref 0.0–1.2)
CO2: 25 mmol/L (ref 20–29)
Calcium: 10.5 mg/dL — ABNORMAL HIGH (ref 8.6–10.2)
Chloride: 98 mmol/L (ref 96–106)
Creatinine, Ser: 0.91 mg/dL (ref 0.76–1.27)
GFR calc Af Amer: 104 mL/min/{1.73_m2} (ref >59)
GFR calc non Af Amer: 90 mL/min/{1.73_m2} (ref >59)
Globulin, Total: 2.1 g/dL (ref 1.5–4.5)
Glucose: 347 mg/dL — ABNORMAL HIGH (ref 65–99)
Potassium: 5 mmol/L (ref 3.5–5.2)
Sodium: 140 mmol/L (ref 134–144)
Total Protein: 6.7 g/dL (ref 6.0–8.5)

## 2018-04-08 LAB — BAYER DCA HB A1C WAIVED: HB A1C (BAYER DCA - WAIVED): 8.3 % — ABNORMAL HIGH (ref <7.0)

## 2018-04-08 MED ORDER — HYDROCHLOROTHIAZIDE 25 MG PO TABS: 25.0000 mg | ORAL_TABLET | Freq: Every day | ORAL | 1 refills | Status: DC

## 2018-04-08 MED ORDER — FENOFIBRATE 160 MG PO TABS: 160.0000 mg | ORAL_TABLET | Freq: Every day | ORAL | 1 refills | Status: DC

## 2018-04-08 MED ORDER — LOSARTAN POTASSIUM 100 MG PO TABS: 100.0000 mg | ORAL_TABLET | Freq: Every day | ORAL | 1 refills | Status: DC

## 2018-04-08 MED ORDER — METFORMIN HCL 1000 MG PO TABS: 1000.0000 mg | ORAL_TABLET | Freq: Two times a day (BID) | ORAL | 1 refills | Status: DC

## 2018-04-08 MED ORDER — ROSUVASTATIN CALCIUM 10 MG PO TABS: 10.0000 mg | ORAL_TABLET | Freq: Every day | ORAL | 1 refills | Status: DC

## 2018-04-08 MED ORDER — FELODIPINE ER 5 MG PO TB24: 5.0000 mg | ORAL_TABLET | Freq: Every day | ORAL | 1 refills | Status: DC

## 2018-04-08 NOTE — Progress Notes (Signed)
Subjective:    Patient ID: Michael Valenzuela, male    DOB: 08/05/1956, 62 y.o.   MRN: [id number]   Chief Complaint: Medical Management of Chronic Issues   HPI:  1. Essential hypertension, benign  No c/o chest pain, sob or headache. Does not check blood pressure at home. BP Readings from Last 3 Encounters:  04/08/18 139/86  12/31/17 (!) 151/89  10/08/17 134/89     2. Hyperlipidemia with target LDL less than 100  Does not watch diet- does very little exercise  3. Type 2 diabetes mellitus without complication, without long-term current use of insulin ([place]) last hgba1c was 6.8%. He does not check blood sugars everyday. Does not watch diet  4. Vitamin D deficiency  Not taking vitamin d supplements  5. Smoker  Smokes over a pack a day  6. Fibromyalgia  Hurts alll the time. He goes to pain clinic mointhly. He doe snot like themm but I told him that he would have a hard time getting in at another clinic.  7. Chronic midline low back pain with sciatica, sciatica laterality unspecified  Not surgical candidate. Walks slumped over an dslow  8. BMI 30.0-30.9,adult  Discussed diet and exercise for person with BMI >25 Will recheck weight in 3-6 months     Outpatient Encounter Medications as of 04/08/2018  Medication Sig  . aspirin EC 81 MG tablet Take 1 tablet (81 mg total) by mouth daily.  . cyclobenzaprine (FLEXERIL) 10 MG tablet Take 1 tablet (10 mg total) by mouth 3 (three) times daily as needed for muscle spasms.  . felodipine (PLENDIL) 5 MG 24 hr tablet Take 1 tablet (5 mg total) by mouth daily.  . fenofibrate 160 MG tablet Take 1 tablet (160 mg total) by mouth daily.  Marland Kitchen glucose blood test strip TrueTrack strips. Test BS qd. Dx E11.9  . hydrochlorothiazide (HYDRODIURIL) 25 MG tablet Take 1 tablet (25 mg total) by mouth daily.  Elmore Guise Devices (ACCU-CHEK SOFTCLIX) lancets Use as instructed  . losartan (COZAAR) 100 MG tablet Take 1 tablet (100 mg total) by mouth daily.  . metFORMIN  (GLUCOPHAGE) 1000 MG tablet Take 1 tablet (1,000 mg total) by mouth 2 (two) times daily with a meal.  . oxyCODONE (ROXICODONE) 15 MG immediate release tablet Take 15 mg by mouth 4 (four) times daily.  . rosuvastatin (CRESTOR) 10 MG tablet Take 1 tablet (10 mg total) by mouth daily.      New complaints: None today  Social history: Is on disablity  Review of Systems  Constitutional: Negative for activity change and appetite change.  HENT: Negative.   Eyes: Negative for pain.  Respiratory: Negative for shortness of breath.   Cardiovascular: Negative for chest pain, palpitations and leg swelling.  Gastrointestinal: Negative for abdominal pain.  Endocrine: Negative for polydipsia.  Genitourinary: Negative.   Musculoskeletal: Positive for back pain, gait problem and myalgias.  Skin: Negative for rash.  Neurological: Negative for dizziness, weakness and headaches.  Hematological: Does not bruise/bleed easily.  Psychiatric/Behavioral: Negative.   All other systems reviewed and are negative.      Objective:   Physical Exam  Constitutional: He is oriented to person, place, and time. He appears well-developed and well-nourished. He appears distressed (moderate).  HENT:  Head: Normocephalic.  Nose: Nose normal.  Mouth/Throat: Oropharynx is clear and moist.  Eyes: Pupils are equal, round, and reactive to light. EOM are normal.  Neck: Normal range of motion and phonation normal. Neck supple. No JVD present. Carotid bruit  is not present. No thyroid mass and no thyromegaly present.  Cardiovascular: Normal rate and regular rhythm.  Pulmonary/Chest: Effort normal and breath sounds normal. No respiratory distress.  Abdominal: Soft. Normal appearance, normal aorta and bowel sounds are normal. There is no tenderness.  Musculoskeletal: Normal range of motion.  Gait slumped over and slow and steady Pain with any movement of lumbar spine  Lymphadenopathy:    He has no cervical adenopathy.    Neurological: He is alert and oriented to person, place, and time.  Skin: Skin is warm and dry.  Psychiatric: He has a normal mood and affect. His behavior is normal. Judgment and thought content normal.  Nursing note and vitals reviewed.  BP 139/86   Pulse 80   Temp 98.2 F (36.8 C) (Oral)   Ht _0  (1.778 m)   Wt 210 lb (95.3 kg)   BMI 30.13 kg/m   hgba1c 8.3%      Assessment & Plan:  [name] comes in today with chief complaint of Medical Management of Chronic Issues   Diagnosis and orders addressed:  1. Essential hypertension, benign Low sodium diet - CMP14+EGFR - felodipine (PLENDIL) 5 MG 24 hr tablet; Take 1 tablet (5 mg total) by mouth daily.  Dispense: 90 tablet; Refill: 1 - hydrochlorothiazide (HYDRODIURIL) 25 MG tablet; Take 1 tablet (25 mg total) by mouth daily.  Dispense: 90 tablet; Refill: 1 - losartan (COZAAR) 100 MG tablet; Take 1 tablet (100 mg total) by mouth daily.  Dispense: 90 tablet; Refill: 1  2. Hyperlipidemia with target LDL less than 100 Low fat diet - Lipid panel - fenofibrate 160 MG tablet; Take 1 tablet (160 mg total) by mouth daily.  Dispense: 90 tablet; Refill: 1 - rosuvastatin (CRESTOR) 10 MG tablet; Take 1 tablet (10 mg total) by mouth daily.  Dispense: 90 tablet; Refill: 1  3. Type 2 diabetes mellitus without complication, without long-term current use of insulin ([facility]) Continue to watch carbs in diet Keep diary of blood sugars Will not make changes today - Bayer DCA Hb A1c Waived - Microalbumin / creatinine urine ratio - metFORMIN (GLUCOPHAGE) 1000 MG tablet; Take 1 tablet (1,000 mg total) by mouth 2 (two) times daily with a meal.  Dispense: 180 tablet; Refill: 1  4. Vitamin D deficiency Need daily vitamin d supplement  5. Smoker Smoking cessation encouraged  6. Fibromyalgia Exercise to keep muscles warm  7. Chronic midline low back pain with sciatica, sciatica laterality unspecified Back stretches Continue pain  clinic  8. BMI 30.0-30.9,adult Discussed diet and exercise for person with BMI >25 Will recheck weight in 3-6 months   Labs pending Health Maintenance reviewed Diet and exercise encouraged  Follow up plan: 3 months   [name] Done, FNP

## 2018-04-08 NOTE — Patient Instructions (Signed)

## 2018-04-09 DIAGNOSIS — M797 Fibromyalgia: Secondary | ICD-10-CM | POA: Diagnosis not present

## 2018-04-09 DIAGNOSIS — M25512 Pain in left shoulder: Secondary | ICD-10-CM | POA: Diagnosis not present

## 2018-04-09 DIAGNOSIS — M25511 Pain in right shoulder: Secondary | ICD-10-CM | POA: Diagnosis not present

## 2018-04-09 DIAGNOSIS — G894 Chronic pain syndrome: Secondary | ICD-10-CM | POA: Diagnosis not present

## 2018-04-09 DIAGNOSIS — M79662 Pain in left lower leg: Secondary | ICD-10-CM | POA: Diagnosis not present

## 2018-04-09 DIAGNOSIS — M545 Low back pain: Secondary | ICD-10-CM | POA: Diagnosis not present

## 2018-04-09 DIAGNOSIS — M79661 Pain in right lower leg: Secondary | ICD-10-CM | POA: Diagnosis not present

## 2018-04-09 DIAGNOSIS — M542 Cervicalgia: Secondary | ICD-10-CM | POA: Diagnosis not present

## 2018-04-09 LAB — MICROALBUMIN / CREATININE URINE RATIO
Creatinine, Urine: 41.9 mg/dL
Microalb/Creat Ratio: <7.2 mg/g creat (ref 0.0–30.0)
Microalbumin, Urine: <3 ug/mL

## 2018-05-10 DIAGNOSIS — M542 Cervicalgia: Secondary | ICD-10-CM | POA: Diagnosis not present

## 2018-05-10 DIAGNOSIS — M545 Low back pain: Secondary | ICD-10-CM | POA: Diagnosis not present

## 2018-05-10 DIAGNOSIS — M25511 Pain in right shoulder: Secondary | ICD-10-CM | POA: Diagnosis not present

## 2018-05-10 DIAGNOSIS — M25512 Pain in left shoulder: Secondary | ICD-10-CM | POA: Diagnosis not present

## 2018-05-10 DIAGNOSIS — G894 Chronic pain syndrome: Secondary | ICD-10-CM | POA: Diagnosis not present

## 2018-05-10 DIAGNOSIS — M79662 Pain in left lower leg: Secondary | ICD-10-CM | POA: Diagnosis not present

## 2018-05-10 DIAGNOSIS — M79661 Pain in right lower leg: Secondary | ICD-10-CM | POA: Diagnosis not present

## 2018-05-10 DIAGNOSIS — M797 Fibromyalgia: Secondary | ICD-10-CM | POA: Diagnosis not present

## 2018-06-04 ENCOUNTER — Other Ambulatory Visit: Payer: Self-pay

## 2018-06-04 MED ORDER — CYCLOBENZAPRINE HCL 10 MG PO TABS: 10.0000 mg | ORAL_TABLET | Freq: Three times a day (TID) | ORAL | 0 refills | Status: DC | PRN

## 2018-06-04 NOTE — Telephone Encounter (Signed)
Last seen 04/08/18  MMM

## 2018-06-11 DIAGNOSIS — M79661 Pain in right lower leg: Secondary | ICD-10-CM | POA: Diagnosis not present

## 2018-06-11 DIAGNOSIS — M797 Fibromyalgia: Secondary | ICD-10-CM | POA: Diagnosis not present

## 2018-06-11 DIAGNOSIS — G894 Chronic pain syndrome: Secondary | ICD-10-CM | POA: Diagnosis not present

## 2018-06-11 DIAGNOSIS — M542 Cervicalgia: Secondary | ICD-10-CM | POA: Diagnosis not present

## 2018-06-11 DIAGNOSIS — M25512 Pain in left shoulder: Secondary | ICD-10-CM | POA: Diagnosis not present

## 2018-06-11 DIAGNOSIS — M79662 Pain in left lower leg: Secondary | ICD-10-CM | POA: Diagnosis not present

## 2018-06-11 DIAGNOSIS — M545 Low back pain: Secondary | ICD-10-CM | POA: Diagnosis not present

## 2018-06-11 DIAGNOSIS — M25511 Pain in right shoulder: Secondary | ICD-10-CM | POA: Diagnosis not present

## 2018-07-09 ENCOUNTER — Ambulatory Visit (INDEPENDENT_AMBULATORY_CARE_PROVIDER_SITE_OTHER): Payer: Medicare Other | Admitting: Nurse Practitioner

## 2018-07-09 ENCOUNTER — Encounter: Payer: Self-pay | Admitting: Nurse Practitioner

## 2018-07-09 VITALS — BP 130/86 | HR 79 | Temp 97.5°F | Ht 70.0 in | Wt 206.0 lb

## 2018-07-09 DIAGNOSIS — M79662 Pain in left lower leg: Secondary | ICD-10-CM | POA: Diagnosis not present

## 2018-07-09 DIAGNOSIS — E559 Vitamin D deficiency, unspecified: Secondary | ICD-10-CM

## 2018-07-09 DIAGNOSIS — M797 Fibromyalgia: Secondary | ICD-10-CM

## 2018-07-09 DIAGNOSIS — G894 Chronic pain syndrome: Secondary | ICD-10-CM | POA: Diagnosis not present

## 2018-07-09 DIAGNOSIS — M5440 Lumbago with sciatica, unspecified side: Secondary | ICD-10-CM

## 2018-07-09 DIAGNOSIS — F17200 Nicotine dependence, unspecified, uncomplicated: Secondary | ICD-10-CM

## 2018-07-09 DIAGNOSIS — E1169 Type 2 diabetes mellitus with other specified complication: Secondary | ICD-10-CM

## 2018-07-09 DIAGNOSIS — Z6830 Body mass index (BMI) 30.0-30.9, adult: Secondary | ICD-10-CM | POA: Diagnosis not present

## 2018-07-09 DIAGNOSIS — M79661 Pain in right lower leg: Secondary | ICD-10-CM | POA: Diagnosis not present

## 2018-07-09 DIAGNOSIS — M542 Cervicalgia: Secondary | ICD-10-CM | POA: Diagnosis not present

## 2018-07-09 DIAGNOSIS — E785 Hyperlipidemia, unspecified: Secondary | ICD-10-CM | POA: Diagnosis not present

## 2018-07-09 DIAGNOSIS — I10 Essential (primary) hypertension: Secondary | ICD-10-CM

## 2018-07-09 DIAGNOSIS — M25511 Pain in right shoulder: Secondary | ICD-10-CM | POA: Diagnosis not present

## 2018-07-09 DIAGNOSIS — G8929 Other chronic pain: Secondary | ICD-10-CM | POA: Diagnosis not present

## 2018-07-09 DIAGNOSIS — M25512 Pain in left shoulder: Secondary | ICD-10-CM | POA: Diagnosis not present

## 2018-07-09 DIAGNOSIS — M545 Low back pain: Secondary | ICD-10-CM | POA: Diagnosis not present

## 2018-07-09 LAB — BAYER DCA HB A1C WAIVED: HB A1C (BAYER DCA - WAIVED): 9 % — ABNORMAL HIGH (ref <7.0)

## 2018-07-09 MED ORDER — SITAGLIPTIN PHOSPHATE 100 MG PO TABS: 100.0000 mg | ORAL_TABLET | Freq: Every day | ORAL | 1 refills | Status: DC

## 2018-07-09 NOTE — Progress Notes (Signed)
Subjective:    Patient ID: Michael Valenzuela, male    DOB: Dec 03, 1955, 62 y.o.   MRN: [id number]   Chief Complaint: Medical Management of Chronic issues  HPI:  1. Essential hypertension, benign  No c/o chest pain, sob or headache. Does not check blood pressure at home. BP Readings from Last 3 Encounters:  04/08/18 139/86  12/31/17 (!) 151/89  10/08/17 134/89     2. Type 2 diabetes mellitus with other specified complication, without long-term current use of insulin (HCC) last hgba1c was 8.3%. He did not want o make change. Wanted to try to do diet to se if he could get it back under control. Fasting blood sugars 130-150 most of time. He has been trying to watch carbs in diet  3. Vitamin D deficiency  Does not take supplement daily because he forgets  4. Smoker   5. Hyperlipidemia with target LDL less than 100  Does not watch diet and does no exercise.  6. Fibromyalgia  He has chronic pain and is going to pain clinic for pain control.  7. Chronic midline low back pain with sciatica, sciatica laterality unspecified  He has had chronic back pain for years. Again he goes to pain management.  8. BMI 30.0-30.9,adult  No recent weight changes.    Outpatient Encounter Medications as of 07/09/2018  Medication Sig  . aspirin EC 81 MG tablet Take 1 tablet (81 mg total) by mouth daily.  . cyclobenzaprine (FLEXERIL) 10 MG tablet Take 1 tablet (10 mg total) by mouth 3 (three) times daily as needed for muscle spasms.  . felodipine (PLENDIL) 5 MG 24 hr tablet Take 1 tablet (5 mg total) by mouth daily.  . fenofibrate 160 MG tablet Take 1 tablet (160 mg total) by mouth daily.  Marland Kitchen glucose blood test strip TrueTrack strips. Test BS qd. Dx E11.9  . hydrochlorothiazide (HYDRODIURIL) 25 MG tablet Take 1 tablet (25 mg total) by mouth daily.  [employer] (ACCU-CHEK SOFTCLIX) lancets Use as instructed  . losartan (COZAAR) 100 MG tablet Take 1 tablet (100 mg total) by mouth daily.  . metFORMIN  (GLUCOPHAGE) 1000 MG tablet Take 1 tablet (1,000 mg total) by mouth 2 (two) times daily with a meal.  . oxyCODONE (ROXICODONE) 15 MG immediate release tablet Take 15 mg by mouth 4 (four) times daily.  . rosuvastatin (CRESTOR) 10 MG tablet Take 1 tablet (10 mg total) by mouth daily.      New complaints: None  today  Social history: Lives in [place] with his wife. Neither one of them are working.  Review of Systems  Constitutional: Negative for activity change and appetite change.  HENT: Negative.   Eyes: Negative for pain.  Respiratory: Negative for shortness of breath.   Cardiovascular: Negative for chest pain, palpitations and leg swelling.  Gastrointestinal: Negative for abdominal pain.  Endocrine: Negative for polydipsia.  Genitourinary: Negative.   Musculoskeletal: Positive for back pain and myalgias.  Skin: Negative for rash.  Neurological: Negative for dizziness, weakness and headaches.  Hematological: Does not bruise/bleed easily.  Psychiatric/Behavioral: Negative.   All other systems reviewed and are negative.      Objective:   Physical Exam  Constitutional: He is oriented to person, place, and time. He appears well-developed and well-nourished. He appears distressed (mild).  HENT:  Head: Normocephalic.  Nose: Nose normal.  Mouth/Throat: Oropharynx is clear and moist.  Eyes: Pupils are equal, round, and reactive to light. EOM are normal.  Neck: Normal range of  motion and phonation normal. Neck supple. No JVD present. Carotid bruit is not present. No thyroid mass and no thyromegaly present.  Cardiovascular: Normal rate and regular rhythm.  Pulmonary/Chest: Effort normal and breath sounds normal. No respiratory distress.  Abdominal: Soft. Normal appearance, normal aorta and bowel sounds are normal. There is no tenderness.  Musculoskeletal: Normal range of motion.  walking with limp on right side. Rises slowly from sitting to standing and gait is very  slow. (+) SLR bil at 90 degrees  Lymphadenopathy:    He has no cervical adenopathy.  Neurological: He is alert and oriented to person, place, and time.  Skin: Skin is warm and dry.  Psychiatric: He has a normal mood and affect. His behavior is normal. Judgment and thought content normal.  Nursing note and vitals reviewed.  BP 130/86   Pulse 79   Temp (!) 97.5 F (36.4 C) (Oral)   Ht 5' 10"  (1.778 m)   Wt 206 lb (93.4 kg)   BMI 29.56 kg/m   hgba1c 9.0%     Assessment & Plan:  Michael Valenzuela comes in today with chief complaint of Medical Management of Chronic Issues   Diagnosis and orders addressed:  1. Essential hypertension, benign Low sodium diet - CMP14+EGFR  2. Type 2 diabetes mellitus with other specified complication, without long-term current use of insulin (HCC) hgba1c has actually gone up- Added januvia 184m daily Keep diary of blood sugars - Bayer DCA Hb A1c Waived - sitaGLIPtin (JANUVIA) 100 MG tablet; Take 1 tablet (100 mg total) by mouth daily.  Dispense: 90 tablet; Refill: 1  3. Vitamin D deficiency Continue daily multivitamin  4. Smoker Smoking cesstaion discussed  5. Hyperlipidemia with target LDL less than 100 Low fat diet - Lipid panel  6. Fibromyalgia Continue at pain clinic Discussed the importance of exercise in keeping muscles warm  7. Chronic midline low back pain with sciatica, sciatica laterality unspecified Again keep up with pain management  8. BMI 30.0-30.9,adult Discussed diet and exercise for person with BMI >25 Will recheck weight in 3-6 months   Labs pending Health Maintenance reviewed Diet and exercise encouraged  Follow up plan: 3 months   Mary-Margaret MHassell Done FNP

## 2018-07-09 NOTE — Patient Instructions (Signed)
Diabetes Mellitus and Nutrition When you have diabetes (diabetes mellitus), it is very important to have healthy eating habits because your blood sugar (glucose) levels are greatly affected by what you eat and drink. Eating healthy foods in the appropriate amounts, at about the same times every day, can help you:  Control your blood glucose.  Lower your risk of heart disease.  Improve your blood pressure.  Reach or maintain a healthy weight.  Every person with diabetes is different, and each person has different needs for a meal plan. Your health care provider may recommend that you work with a diet and nutrition specialist (dietitian) to make a meal plan that is best for you. Your meal plan may vary depending on factors such as:  The calories you need.  The medicines you take.  Your weight.  Your blood glucose, blood pressure, and cholesterol levels.  Your activity level.  Other health conditions you have, such as heart or kidney disease.  How do carbohydrates affect me? Carbohydrates affect your blood glucose level more than any other type of food. Eating carbohydrates naturally increases the amount of glucose in your blood. Carbohydrate counting is a method for keeping track of how many carbohydrates you eat. Counting carbohydrates is important to keep your blood glucose at a healthy level, especially if you use insulin or take certain oral diabetes medicines. It is important to know how many carbohydrates you can safely have in each meal. This is different for every person. Your dietitian can help you calculate how many carbohydrates you should have at each meal and for snack. Foods that contain carbohydrates include:  Bread, cereal, rice, pasta, and crackers.  Potatoes and corn.  Peas, beans, and lentils.  Milk and yogurt.  Fruit and juice.  Desserts, such as cakes, cookies, ice cream, and candy.  How does alcohol affect me? Alcohol can cause a sudden decrease in blood  glucose (hypoglycemia), especially if you use insulin or take certain oral diabetes medicines. Hypoglycemia can be a life-threatening condition. Symptoms of hypoglycemia (sleepiness, dizziness, and confusion) are similar to symptoms of having too much alcohol. If your health care provider says that alcohol is safe for you, follow these guidelines:  Limit alcohol intake to no more than 1 drink per day for nonpregnant women and 2 drinks per day for men. One drink equals 12 oz of beer, 5 oz of wine, or 1 oz of hard liquor.  Do not drink on an empty stomach.  Keep yourself hydrated with water, diet soda, or unsweetened iced tea.  Keep in mind that regular soda, juice, and other mixers may contain a lot of sugar and must be counted as carbohydrates.  What are tips for following this plan? Reading food labels  Start by checking the serving size on the label. The amount of calories, carbohydrates, fats, and other nutrients listed on the label are based on one serving of the food. Many foods contain more than one serving per package.  Check the total grams (g) of carbohydrates in one serving. You can calculate the number of servings of carbohydrates in one serving by dividing the total carbohydrates by 15. For example, if a food has 30 g of total carbohydrates, it would be equal to 2 servings of carbohydrates.  Check the number of grams (g) of saturated and trans fats in one serving. Choose foods that have low or no amount of these fats.  Check the number of milligrams (mg) of sodium in one serving. Most people   should limit total sodium intake to less than 2,300 mg per day.  Always check the nutrition information of foods labeled as "low-fat" or "nonfat". These foods may be higher in added sugar or refined carbohydrates and should be avoided.  Talk to your dietitian to identify your daily goals for nutrients listed on the label. Shopping  Avoid buying canned, premade, or processed foods. These  foods tend to be high in fat, sodium, and added sugar.  Shop around the outside edge of the grocery store. This includes fresh fruits and vegetables, bulk grains, fresh meats, and fresh dairy. Cooking  Use low-heat cooking methods, such as baking, instead of high-heat cooking methods like deep frying.  Cook using healthy oils, such as olive, canola, or sunflower oil.  Avoid cooking with butter, cream, or high-fat meats. Meal planning  Eat meals and snacks regularly, preferably at the same times every day. Avoid going long periods of time without eating.  Eat foods high in fiber, such as fresh fruits, vegetables, beans, and whole grains. Talk to your dietitian about how many servings of carbohydrates you can eat at each meal.  Eat 4-6 ounces of lean protein each day, such as lean meat, chicken, fish, eggs, or tofu. 1 ounce is equal to 1 ounce of meat, chicken, or fish, 1 egg, or 1/4 cup of tofu.  Eat some foods each day that contain healthy fats, such as avocado, nuts, seeds, and fish. Lifestyle   Check your blood glucose regularly.  Exercise at least 30 minutes 5 or more days each week, or as told by your health care provider.  Take medicines as told by your health care provider.  Do not use any products that contain nicotine or tobacco, such as cigarettes and e-cigarettes. If you need help quitting, ask your health care provider.  Work with a counselor or diabetes educator to identify strategies to manage stress and any emotional and social challenges. What are some questions to ask my health care provider?  Do I need to meet with a diabetes educator?  Do I need to meet with a dietitian?  What number can I call if I have questions?  When are the best times to check my blood glucose? Where to find more information:  American Diabetes Association: diabetes.org/food-and-fitness/food  Academy of Nutrition and Dietetics:  www.eatright.org/resources/health/diseases-and-conditions/diabetes  National Institute of Diabetes and Digestive and Kidney Diseases (NIH): www.niddk.nih.gov/health-information/diabetes/overview/diet-eating-physical-activity Summary  A healthy meal plan will help you control your blood glucose and maintain a healthy lifestyle.  Working with a diet and nutrition specialist (dietitian) can help you make a meal plan that is best for you.  Keep in mind that carbohydrates and alcohol have immediate effects on your blood glucose levels. It is important to count carbohydrates and to use alcohol carefully. This information is not intended to replace advice given to you by your health care provider. Make sure you discuss any questions you have with your health care provider. Document Released: 07/24/2005 Document Revised: 12/01/2016 Document Reviewed: 12/01/2016 Elsevier Interactive Patient Education  2018 Elsevier Inc.  

## 2018-07-10 LAB — CMP14+EGFR
ALT: 67 IU/L — ABNORMAL HIGH (ref 0–44)
AST: 74 IU/L — ABNORMAL HIGH (ref 0–40)
Albumin/Globulin Ratio: 2.2 (ref 1.2–2.2)
Albumin: 5 g/dL — ABNORMAL HIGH (ref 3.6–4.8)
Alkaline Phosphatase: 79 IU/L (ref 39–117)
BUN/Creatinine Ratio: 18 (ref 10–24)
BUN: 15 mg/dL (ref 8–27)
Bilirubin Total: 0.3 mg/dL (ref 0.0–1.2)
CO2: 23 mmol/L (ref 20–29)
Calcium: 10.2 mg/dL (ref 8.6–10.2)
Chloride: 97 mmol/L (ref 96–106)
Creatinine, Ser: 0.82 mg/dL (ref 0.76–1.27)
GFR calc Af Amer: 109 mL/min/{1.73_m2} (ref >59)
GFR calc non Af Amer: 94 mL/min/{1.73_m2} (ref >59)
Globulin, Total: 2.3 g/dL (ref 1.5–4.5)
Glucose: 197 mg/dL — ABNORMAL HIGH (ref 65–99)
Potassium: 4.6 mmol/L (ref 3.5–5.2)
Sodium: 140 mmol/L (ref 134–144)
Total Protein: 7.3 g/dL (ref 6.0–8.5)

## 2018-07-10 LAB — LIPID PANEL
Chol/HDL Ratio: 5.3 ratio — ABNORMAL HIGH (ref 0.0–5.0)
Cholesterol, Total: 159 mg/dL (ref 100–199)
HDL: 30 mg/dL — ABNORMAL LOW (ref >39)
Triglycerides: 519 mg/dL — ABNORMAL HIGH (ref 0–149)

## 2018-07-14 ENCOUNTER — Other Ambulatory Visit: Payer: Self-pay | Admitting: *Deleted

## 2018-07-14 MED ORDER — CYCLOBENZAPRINE HCL 10 MG PO TABS: 10.0000 mg | ORAL_TABLET | Freq: Three times a day (TID) | ORAL | 0 refills | Status: DC | PRN

## 2018-07-29 ENCOUNTER — Other Ambulatory Visit: Payer: Self-pay | Admitting: *Deleted

## 2018-07-29 MED ORDER — CYCLOBENZAPRINE HCL 10 MG PO TABS: 10.0000 mg | ORAL_TABLET | Freq: Three times a day (TID) | ORAL | 0 refills | Status: DC | PRN

## 2018-07-30 ENCOUNTER — Other Ambulatory Visit: Payer: Self-pay

## 2018-07-30 MED ORDER — CYCLOBENZAPRINE HCL 10 MG PO TABS: 10.0000 mg | ORAL_TABLET | Freq: Three times a day (TID) | ORAL | 0 refills | Status: DC | PRN

## 2018-08-06 DIAGNOSIS — G894 Chronic pain syndrome: Secondary | ICD-10-CM | POA: Diagnosis not present

## 2018-08-06 DIAGNOSIS — M797 Fibromyalgia: Secondary | ICD-10-CM | POA: Diagnosis not present

## 2018-08-06 DIAGNOSIS — M542 Cervicalgia: Secondary | ICD-10-CM | POA: Diagnosis not present

## 2018-08-06 DIAGNOSIS — M79662 Pain in left lower leg: Secondary | ICD-10-CM | POA: Diagnosis not present

## 2018-08-06 DIAGNOSIS — M25512 Pain in left shoulder: Secondary | ICD-10-CM | POA: Diagnosis not present

## 2018-08-06 DIAGNOSIS — M79661 Pain in right lower leg: Secondary | ICD-10-CM | POA: Diagnosis not present

## 2018-08-06 DIAGNOSIS — M25511 Pain in right shoulder: Secondary | ICD-10-CM | POA: Diagnosis not present

## 2018-08-06 DIAGNOSIS — M545 Low back pain: Secondary | ICD-10-CM | POA: Diagnosis not present

## 2018-09-06 DIAGNOSIS — M797 Fibromyalgia: Secondary | ICD-10-CM | POA: Diagnosis not present

## 2018-09-06 DIAGNOSIS — M545 Low back pain: Secondary | ICD-10-CM | POA: Diagnosis not present

## 2018-09-06 DIAGNOSIS — M25512 Pain in left shoulder: Secondary | ICD-10-CM | POA: Diagnosis not present

## 2018-09-06 DIAGNOSIS — M542 Cervicalgia: Secondary | ICD-10-CM | POA: Diagnosis not present

## 2018-09-06 DIAGNOSIS — M79661 Pain in right lower leg: Secondary | ICD-10-CM | POA: Diagnosis not present

## 2018-09-06 DIAGNOSIS — G894 Chronic pain syndrome: Secondary | ICD-10-CM | POA: Diagnosis not present

## 2018-09-06 DIAGNOSIS — M79662 Pain in left lower leg: Secondary | ICD-10-CM | POA: Diagnosis not present

## 2018-09-14 DIAGNOSIS — H5213 Myopia, bilateral: Secondary | ICD-10-CM | POA: Diagnosis not present

## 2018-09-14 DIAGNOSIS — E119 Type 2 diabetes mellitus without complications: Secondary | ICD-10-CM | POA: Diagnosis not present

## 2018-09-14 DIAGNOSIS — H25093 Other age-related incipient cataract, bilateral: Secondary | ICD-10-CM | POA: Diagnosis not present

## 2018-09-14 DIAGNOSIS — H538 Other visual disturbances: Secondary | ICD-10-CM | POA: Diagnosis not present

## 2018-09-14 DIAGNOSIS — H524 Presbyopia: Secondary | ICD-10-CM | POA: Diagnosis not present

## 2018-09-14 LAB — HM DIABETES EYE EXAM

## 2018-10-04 ENCOUNTER — Other Ambulatory Visit: Payer: Self-pay | Admitting: Nurse Practitioner

## 2018-10-04 DIAGNOSIS — I10 Essential (primary) hypertension: Secondary | ICD-10-CM

## 2018-10-04 DIAGNOSIS — E785 Hyperlipidemia, unspecified: Secondary | ICD-10-CM

## 2018-10-11 ENCOUNTER — Encounter: Payer: Self-pay | Admitting: Nurse Practitioner

## 2018-10-11 ENCOUNTER — Ambulatory Visit (INDEPENDENT_AMBULATORY_CARE_PROVIDER_SITE_OTHER): Payer: Medicare Other | Admitting: Nurse Practitioner

## 2018-10-11 VITALS — BP 145/89 | HR 69 | Temp 97.1°F | Ht 70.0 in | Wt 202.0 lb

## 2018-10-11 DIAGNOSIS — M5440 Lumbago with sciatica, unspecified side: Secondary | ICD-10-CM

## 2018-10-11 DIAGNOSIS — M25511 Pain in right shoulder: Secondary | ICD-10-CM | POA: Diagnosis not present

## 2018-10-11 DIAGNOSIS — M79661 Pain in right lower leg: Secondary | ICD-10-CM | POA: Diagnosis not present

## 2018-10-11 DIAGNOSIS — M797 Fibromyalgia: Secondary | ICD-10-CM | POA: Diagnosis not present

## 2018-10-11 DIAGNOSIS — E1169 Type 2 diabetes mellitus with other specified complication: Secondary | ICD-10-CM

## 2018-10-11 DIAGNOSIS — E119 Type 2 diabetes mellitus without complications: Secondary | ICD-10-CM

## 2018-10-11 DIAGNOSIS — M545 Low back pain: Secondary | ICD-10-CM | POA: Diagnosis not present

## 2018-10-11 DIAGNOSIS — E785 Hyperlipidemia, unspecified: Secondary | ICD-10-CM

## 2018-10-11 DIAGNOSIS — F17200 Nicotine dependence, unspecified, uncomplicated: Secondary | ICD-10-CM | POA: Diagnosis not present

## 2018-10-11 DIAGNOSIS — E559 Vitamin D deficiency, unspecified: Secondary | ICD-10-CM

## 2018-10-11 DIAGNOSIS — Z6830 Body mass index (BMI) 30.0-30.9, adult: Secondary | ICD-10-CM

## 2018-10-11 DIAGNOSIS — G8929 Other chronic pain: Secondary | ICD-10-CM | POA: Diagnosis not present

## 2018-10-11 DIAGNOSIS — Z23 Encounter for immunization: Secondary | ICD-10-CM | POA: Diagnosis not present

## 2018-10-11 DIAGNOSIS — I10 Essential (primary) hypertension: Secondary | ICD-10-CM | POA: Diagnosis not present

## 2018-10-11 DIAGNOSIS — M25512 Pain in left shoulder: Secondary | ICD-10-CM | POA: Diagnosis not present

## 2018-10-11 DIAGNOSIS — M542 Cervicalgia: Secondary | ICD-10-CM | POA: Diagnosis not present

## 2018-10-11 DIAGNOSIS — M79662 Pain in left lower leg: Secondary | ICD-10-CM | POA: Diagnosis not present

## 2018-10-11 DIAGNOSIS — G894 Chronic pain syndrome: Secondary | ICD-10-CM | POA: Diagnosis not present

## 2018-10-11 LAB — BAYER DCA HB A1C WAIVED: HB A1C (BAYER DCA - WAIVED): 6.1 % (ref <7.0)

## 2018-10-11 MED ORDER — ROSUVASTATIN CALCIUM 10 MG PO TABS: 10.0000 mg | ORAL_TABLET | Freq: Every day | ORAL | 1 refills | Status: DC

## 2018-10-11 MED ORDER — METFORMIN HCL 1000 MG PO TABS: 1000.0000 mg | ORAL_TABLET | Freq: Two times a day (BID) | ORAL | 1 refills | Status: DC

## 2018-10-11 MED ORDER — SITAGLIPTIN PHOSPHATE 100 MG PO TABS: 100.0000 mg | ORAL_TABLET | Freq: Every day | ORAL | 1 refills | Status: DC

## 2018-10-11 NOTE — Patient Instructions (Signed)
Steps to Quit Smoking Smoking tobacco can be bad for your health. It can also affect almost every organ in your body. Smoking puts you and people around you at risk for many serious long-lasting (chronic) diseases. Quitting smoking is hard, but it is one of the best things that you can do for your health. It is never too late to quit. What are the benefits of quitting smoking? When you quit smoking, you lower your risk for getting serious diseases and conditions. They can include:  Lung cancer or lung disease.  Heart disease.  Stroke.  Heart attack.  Not being able to have children (infertility).  Weak bones (osteoporosis) and broken bones (fractures).  If you have coughing, wheezing, and shortness of breath, those symptoms may get better when you quit. You may also get sick less often. If you are pregnant, quitting smoking can help to lower your chances of having a baby of low birth weight. What can I do to help me quit smoking? Talk with your doctor about what can help you quit smoking. Some things you can do (strategies) include:  Quitting smoking totally, instead of slowly cutting back how much you smoke over a period of time.  Going to in-person counseling. You are more likely to quit if you go to many counseling sessions.  Using resources and support systems, such as: ? Online chats with a counselor. ? Phone quitlines. ? Printed self-help materials. ? Support groups or group counseling. ? Text messaging programs. ? Mobile phone apps or applications.  Taking medicines. Some of these medicines may have nicotine in them. If you are pregnant or breastfeeding, do not take any medicines to quit smoking unless your doctor says it is okay. Talk with your doctor about counseling or other things that can help you.  Talk with your doctor about using more than one strategy at the same time, such as taking medicines while you are also going to in-person counseling. This can help make  quitting easier. What things can I do to make it easier to quit? Quitting smoking might feel very hard at first, but there is a lot that you can do to make it easier. Take these steps:  Talk to your family and friends. Ask them to support and encourage you.  Call phone quitlines, reach out to support groups, or work with a counselor.  Ask people who smoke to not smoke around you.  Avoid places that make you want (trigger) to smoke, such as: ? Bars. ? Parties. ? Smoke-break areas at work.  Spend time with people who do not smoke.  Lower the stress in your life. Stress can make you want to smoke. Try these things to help your stress: ? Getting regular exercise. ? Deep-breathing exercises. ? Yoga. ? Meditating. ? Doing a body scan. To do this, close your eyes, focus on one area of your body at a time from head to toe, and notice which parts of your body are tense. Try to relax the muscles in those areas.  Download or buy apps on your mobile phone or tablet that can help you stick to your quit plan. There are many free apps, such as QuitGuide from the CDC (Centers for Disease Control and Prevention). You can find more support from smokefree.gov and other websites.  This information is not intended to replace advice given to you by your health care provider. Make sure you discuss any questions you have with your health care provider. Document Released: 08/23/2009 Document   Revised: 06/24/2016 Document Reviewed: 03/13/2015 Elsevier Interactive Patient Education  2018 Elsevier Inc.  

## 2018-10-11 NOTE — Progress Notes (Addendum)
Subjective:    Patient ID: Michael Valenzuela, male    DOB: August 14, 1956, 62 y.o.   MRN: [id number]   Chief Complaint: Medical Management of Chronic Issues   HPI:  1. Type 2 diabetes mellitus with other specified complication, without long-term current use of insulin (HCC) last hgba1c was 9.0. Michael Valenzuela was added at last visit. Fasting blood sugars running between 110-120. He has tried to watch diet but says does not always do well.  2. Essential hypertension, benign  No c/o chest pain, sob or headache. Does not check blood pressure at home. BP Readings from Last 3 Encounters:  10/11/18 (!) 145/89  07/09/18 130/86  04/08/18 139/86     3. Hyperlipidemia with target LDL less than 100  Tries to avoid fried foods. Does no exercise.  4. BMI 30.0-30.9,adult  No recent weight changes  5. Chronic midline low back pain with sciatica, sciatica laterality unspecified  he see pain management monthly and had visit earlier this morning.  6. Fibromyalgia  Pain 3/10 today- again had visit at pain clininc this morning.  7. Smoker  Smokes over a pack a day  8. Vitamin D deficiency  Takes daily vitamin d supplement    Outpatient Encounter Medications as of 10/11/2018  Medication Sig  . aspirin EC 81 MG tablet Take 1 tablet (81 mg total) by mouth daily.  . cyclobenzaprine (FLEXERIL) 10 MG tablet take 1 tablet by mouth three times a day AS NEEDED FOR MUSCLE SPASMS  . felodipine (PLENDIL) 5 MG 24 hr tablet take 1 tablet by mouth once daily  . fenofibrate 160 MG tablet take 1 tablet by mouth once daily  . glucose blood test strip TrueTrack strips. Test BS qd. Dx E11.9  . hydrochlorothiazide (HYDRODIURIL) 25 MG tablet take 1 tablet by mouth once daily  . [employer] (ACCU-CHEK SOFTCLIX) lancets Use as instructed  . losartan (COZAAR) 100 MG tablet take 1 tablet by mouth once daily  . metFORMIN (GLUCOPHAGE) 1000 MG tablet Take 1 tablet (1,000 mg total) by mouth 2 (two) times daily with a meal.  .  rosuvastatin (CRESTOR) 10 MG tablet Take 1 tablet (10 mg total) by mouth daily.  . sitaGLIPtin (Michael Valenzuela) 100 MG tablet Take 1 tablet (100 mg total) by mouth daily.       New complaints: None today  Social history: Lives with wife. He is on disabilty for back pain and fibromyalgia  Review of Systems  Constitutional: Negative for activity change and appetite change.  HENT: Negative.   Eyes: Negative for pain.  Respiratory: Negative for shortness of breath.   Cardiovascular: Negative for chest pain, palpitations and leg swelling.  Gastrointestinal: Negative for abdominal pain.  Endocrine: Negative for polydipsia.  Genitourinary: Negative.   Skin: Negative for rash.  Neurological: Negative for dizziness, weakness and headaches.  Hematological: Does not bruise/bleed easily.  Psychiatric/Behavioral: Negative.   All other systems reviewed and are negative.      Objective:   Physical Exam  Constitutional: He is oriented to person, place, and time. He appears well-developed and well-nourished.  HENT:  Head: Normocephalic.  Nose: Nose normal.  Mouth/Throat: Oropharynx is clear and moist.  Eyes: Pupils are equal, round, and reactive to light. EOM are normal.  Neck: Normal range of motion and phonation normal. Neck supple. No JVD present. Carotid bruit is not present. No thyroid mass and no thyromegaly present.  Cardiovascular: Normal rate and regular rhythm.  Murmur (3/6 systolic murmur heard loudest at mitral valve) heard. Pulmonary/Chest: Effort  normal and breath sounds normal. No respiratory distress.  Abdominal: Soft. Normal appearance, normal aorta and bowel sounds are normal. There is no tenderness.  Musculoskeletal: Normal range of motion.  Walks with limp on left today  Lymphadenopathy:    He has no cervical adenopathy.  Neurological: He is alert and oriented to person, place, and time.  Skin: Skin is warm and dry.  Psychiatric: He has a normal mood and affect. His  behavior is normal. Judgment and thought content normal.  Nursing note and vitals reviewed.   BP (!) 145/89   Pulse 69   Temp (!) 97.1 F (36.2 C) (Oral)   Ht 5' 10"  (1.778 m)   Wt 202 lb (91.6 kg)   BMI 28.98 kg/m   hgba1c 6.1     Assessment & Plan:  Michael Valenzuela comes in today with chief complaint of Medical Management of Chronic Issues   Diagnosis and orders addressed:  1. Type 2 diabetes mellitus with other specified complication, without long-term current use of insulin (HCC) Continue to watch carbs in diet- metFORMIN (GLUCOPHAGE) 1000 MG tablet; Take 1 tablet (1,000 mg total) by mouth 2 (two) times daily with a meal.  Dispense: 180 tablet; Refill: 1 - Bayer DCA Hb A1c Waived - sitaGLIPtin (Michael Valenzuela) 100 MG tablet; Take 1 tablet (100 mg total) by mouth daily.  Dispense: 90 tablet; Refill: 1  2. Essential hypertension, benign Low sodium diet - CMP14+EGFR  3. Hyperlipidemia with target LDL less than 100 Low fat diet - Lipid panel - rosuvastatin (CRESTOR) 10 MG tablet; Take 1 tablet (10 mg total) by mouth daily.  Dispense: 90 tablet; Refill: 1  4. BMI 30.0-30.9,adult Discussed diet and exercise for person with BMI >25 Will recheck weight in 3-6 months  5. Chronic midline low back pain with sciatica, sciatica laterality unspecified continue to see pain management  6. Fibromyalgia Again continue pain management'exercise to keep muscles warm  7. Smoker Smoking cessation encouraged  8. Vitamin D deficiency Continue vitamin d supplement    Labs pending Health Maintenance reviewed Diet and exercise encouraged  Follow up plan: 3 months   [name] Done, FNP

## 2018-10-12 LAB — CMP14+EGFR
ALT: 46 IU/L — ABNORMAL HIGH (ref 0–44)
AST: 53 IU/L — ABNORMAL HIGH (ref 0–40)
Albumin/Globulin Ratio: 2 (ref 1.2–2.2)
Albumin: 4.9 g/dL — ABNORMAL HIGH (ref 3.6–4.8)
Alkaline Phosphatase: 53 IU/L (ref 39–117)
BUN/Creatinine Ratio: 24 (ref 10–24)
BUN: 21 mg/dL (ref 8–27)
Bilirubin Total: 0.4 mg/dL (ref 0.0–1.2)
CO2: 24 mmol/L (ref 20–29)
Calcium: 9.8 mg/dL (ref 8.6–10.2)
Chloride: 100 mmol/L (ref 96–106)
Creatinine, Ser: 0.87 mg/dL (ref 0.76–1.27)
GFR calc Af Amer: 106 mL/min/{1.73_m2} (ref >59)
GFR calc non Af Amer: 92 mL/min/{1.73_m2} (ref >59)
Globulin, Total: 2.4 g/dL (ref 1.5–4.5)
Glucose: 113 mg/dL — ABNORMAL HIGH (ref 65–99)
Potassium: 4.4 mmol/L (ref 3.5–5.2)
Sodium: 140 mmol/L (ref 134–144)
Total Protein: 7.3 g/dL (ref 6.0–8.5)

## 2018-10-12 LAB — LIPID PANEL
Chol/HDL Ratio: 3.8 ratio (ref 0.0–5.0)
Cholesterol, Total: 139 mg/dL (ref 100–199)
HDL: 37 mg/dL — ABNORMAL LOW (ref >39)
LDL Calculated: 54 mg/dL (ref 0–99)
Triglycerides: 239 mg/dL — ABNORMAL HIGH (ref 0–149)
VLDL Cholesterol Cal: 48 mg/dL — ABNORMAL HIGH (ref 5–40)

## 2018-11-11 DIAGNOSIS — M797 Fibromyalgia: Secondary | ICD-10-CM | POA: Diagnosis not present

## 2018-11-11 DIAGNOSIS — M79661 Pain in right lower leg: Secondary | ICD-10-CM | POA: Diagnosis not present

## 2018-11-11 DIAGNOSIS — M545 Low back pain: Secondary | ICD-10-CM | POA: Diagnosis not present

## 2018-11-11 DIAGNOSIS — G894 Chronic pain syndrome: Secondary | ICD-10-CM | POA: Diagnosis not present

## 2018-11-11 DIAGNOSIS — M25511 Pain in right shoulder: Secondary | ICD-10-CM | POA: Diagnosis not present

## 2018-11-11 DIAGNOSIS — M25512 Pain in left shoulder: Secondary | ICD-10-CM | POA: Diagnosis not present

## 2018-11-11 DIAGNOSIS — M79662 Pain in left lower leg: Secondary | ICD-10-CM | POA: Diagnosis not present

## 2018-11-11 DIAGNOSIS — M542 Cervicalgia: Secondary | ICD-10-CM | POA: Diagnosis not present

## 2018-12-07 ENCOUNTER — Other Ambulatory Visit: Payer: Self-pay | Admitting: Nurse Practitioner

## 2018-12-08 NOTE — Telephone Encounter (Signed)
Last seen 10/31/18

## 2018-12-10 DIAGNOSIS — M25511 Pain in right shoulder: Secondary | ICD-10-CM | POA: Diagnosis not present

## 2018-12-10 DIAGNOSIS — M25512 Pain in left shoulder: Secondary | ICD-10-CM | POA: Diagnosis not present

## 2018-12-10 DIAGNOSIS — M797 Fibromyalgia: Secondary | ICD-10-CM | POA: Diagnosis not present

## 2018-12-10 DIAGNOSIS — M79661 Pain in right lower leg: Secondary | ICD-10-CM | POA: Diagnosis not present

## 2018-12-10 DIAGNOSIS — M79662 Pain in left lower leg: Secondary | ICD-10-CM | POA: Diagnosis not present

## 2018-12-10 DIAGNOSIS — G894 Chronic pain syndrome: Secondary | ICD-10-CM | POA: Diagnosis not present

## 2018-12-10 DIAGNOSIS — M545 Low back pain: Secondary | ICD-10-CM | POA: Diagnosis not present

## 2018-12-10 DIAGNOSIS — M542 Cervicalgia: Secondary | ICD-10-CM | POA: Diagnosis not present

## 2019-01-07 DIAGNOSIS — G894 Chronic pain syndrome: Secondary | ICD-10-CM | POA: Diagnosis not present

## 2019-01-07 DIAGNOSIS — M545 Low back pain: Secondary | ICD-10-CM | POA: Diagnosis not present

## 2019-01-07 DIAGNOSIS — M25511 Pain in right shoulder: Secondary | ICD-10-CM | POA: Diagnosis not present

## 2019-01-07 DIAGNOSIS — M542 Cervicalgia: Secondary | ICD-10-CM | POA: Diagnosis not present

## 2019-01-07 DIAGNOSIS — M79662 Pain in left lower leg: Secondary | ICD-10-CM | POA: Diagnosis not present

## 2019-01-07 DIAGNOSIS — M797 Fibromyalgia: Secondary | ICD-10-CM | POA: Diagnosis not present

## 2019-01-07 DIAGNOSIS — M25512 Pain in left shoulder: Secondary | ICD-10-CM | POA: Diagnosis not present

## 2019-01-07 DIAGNOSIS — M79661 Pain in right lower leg: Secondary | ICD-10-CM | POA: Diagnosis not present

## 2019-01-11 ENCOUNTER — Ambulatory Visit (INDEPENDENT_AMBULATORY_CARE_PROVIDER_SITE_OTHER): Payer: Medicare Other | Admitting: Nurse Practitioner

## 2019-01-11 ENCOUNTER — Encounter: Payer: Self-pay | Admitting: Nurse Practitioner

## 2019-01-11 VITALS — BP 132/79 | HR 65 | Temp 97.4°F | Ht 70.0 in | Wt 201.0 lb

## 2019-01-11 DIAGNOSIS — M5440 Lumbago with sciatica, unspecified side: Secondary | ICD-10-CM | POA: Diagnosis not present

## 2019-01-11 DIAGNOSIS — E785 Hyperlipidemia, unspecified: Secondary | ICD-10-CM

## 2019-01-11 DIAGNOSIS — E559 Vitamin D deficiency, unspecified: Secondary | ICD-10-CM

## 2019-01-11 DIAGNOSIS — F17200 Nicotine dependence, unspecified, uncomplicated: Secondary | ICD-10-CM

## 2019-01-11 DIAGNOSIS — E1169 Type 2 diabetes mellitus with other specified complication: Secondary | ICD-10-CM | POA: Diagnosis not present

## 2019-01-11 DIAGNOSIS — M797 Fibromyalgia: Secondary | ICD-10-CM | POA: Diagnosis not present

## 2019-01-11 DIAGNOSIS — E119 Type 2 diabetes mellitus without complications: Secondary | ICD-10-CM

## 2019-01-11 DIAGNOSIS — G8929 Other chronic pain: Secondary | ICD-10-CM

## 2019-01-11 DIAGNOSIS — Z6828 Body mass index (BMI) 28.0-28.9, adult: Secondary | ICD-10-CM

## 2019-01-11 DIAGNOSIS — I10 Essential (primary) hypertension: Secondary | ICD-10-CM | POA: Diagnosis not present

## 2019-01-11 LAB — BAYER DCA HB A1C WAIVED: HB A1C (BAYER DCA - WAIVED): 6.6 % (ref <7.0)

## 2019-01-11 MED ORDER — ROSUVASTATIN CALCIUM 10 MG PO TABS: 10.0000 mg | ORAL_TABLET | Freq: Every day | ORAL | 1 refills | Status: DC

## 2019-01-11 MED ORDER — FENOFIBRATE 160 MG PO TABS: 160.0000 mg | ORAL_TABLET | Freq: Every day | ORAL | 1 refills | Status: DC

## 2019-01-11 MED ORDER — HYDROCHLOROTHIAZIDE 25 MG PO TABS: 25.0000 mg | ORAL_TABLET | Freq: Every day | ORAL | 1 refills | Status: DC

## 2019-01-11 MED ORDER — LOSARTAN POTASSIUM 100 MG PO TABS: 100.0000 mg | ORAL_TABLET | Freq: Every day | ORAL | 1 refills | Status: DC

## 2019-01-11 MED ORDER — METFORMIN HCL 1000 MG PO TABS: 1000.0000 mg | ORAL_TABLET | Freq: Two times a day (BID) | ORAL | 1 refills | Status: DC

## 2019-01-11 MED ORDER — SITAGLIPTIN PHOSPHATE 100 MG PO TABS: 100.0000 mg | ORAL_TABLET | Freq: Every day | ORAL | 1 refills | Status: DC

## 2019-01-11 MED ORDER — FELODIPINE ER 5 MG PO TB24: 5.0000 mg | ORAL_TABLET | Freq: Every day | ORAL | 1 refills | Status: DC

## 2019-01-11 NOTE — Patient Instructions (Signed)
Steps to Quit Smoking    Smoking tobacco can be bad for your health. It can also affect almost every organ in your body. Smoking puts you and people around you at risk for many serious long-lasting (chronic) diseases. Quitting smoking is hard, but it is one of the best things that you can do for your health. It is never too late to quit.  What are the benefits of quitting smoking?  When you quit smoking, you lower your risk for getting serious diseases and conditions. They can include:  · Lung cancer or lung disease.  · Heart disease.  · Stroke.  · Heart attack.  · Not being able to have children (infertility).  · Weak bones (osteoporosis) and broken bones (fractures).  If you have coughing, wheezing, and shortness of breath, those symptoms may get better when you quit. You may also get sick less often. If you are pregnant, quitting smoking can help to lower your chances of having a baby of low birth weight.  What can I do to help me quit smoking?  Talk with your doctor about what can help you quit smoking. Some things you can do (strategies) include:  · Quitting smoking totally, instead of slowly cutting back how much you smoke over a period of time.  · Going to in-person counseling. You are more likely to quit if you go to many counseling sessions.  · Using resources and support systems, such as:  ? Online chats with a counselor.  ? Phone quitlines.  ? Printed self-help materials.  ? Support groups or group counseling.  ? Text messaging programs.  ? Mobile phone apps or applications.  · Taking medicines. Some of these medicines may have nicotine in them. If you are pregnant or breastfeeding, do not take any medicines to quit smoking unless your doctor says it is okay. Talk with your doctor about counseling or other things that can help you.  Talk with your doctor about using more than one strategy at the same time, such as taking medicines while you are also going to in-person counseling. This can help make  quitting easier.  What things can I do to make it easier to quit?  Quitting smoking might feel very hard at first, but there is a lot that you can do to make it easier. Take these steps:  · Talk to your family and friends. Ask them to support and encourage you.  · Call phone quitlines, reach out to support groups, or work with a counselor.  · Ask people who smoke to not smoke around you.  · Avoid places that make you want (trigger) to smoke, such as:  ? Bars.  ? Parties.  ? Smoke-break areas at work.  · Spend time with people who do not smoke.  · Lower the stress in your life. Stress can make you want to smoke. Try these things to help your stress:  ? Getting regular exercise.  ? Deep-breathing exercises.  ? Yoga.  ? Meditating.  ? Doing a body scan. To do this, close your eyes, focus on one area of your body at a time from head to toe, and notice which parts of your body are tense. Try to relax the muscles in those areas.  · Download or buy apps on your mobile phone or tablet that can help you stick to your quit plan. There are many free apps, such as QuitGuide from the CDC (Centers for Disease Control and Prevention). You can find more   support from smokefree.gov and other websites.  This information is not intended to replace advice given to you by your health care provider. Make sure you discuss any questions you have with your health care provider.  Document Released: 08/23/2009 Document Revised: 06/24/2016 Document Reviewed: 03/13/2015  Elsevier Interactive Patient Education © 2019 Elsevier Inc.

## 2019-01-11 NOTE — Progress Notes (Signed)
Subjective:    Patient ID: Michael Valenzuela, male    DOB: 1956/01/14, 63 y.o.   MRN: [id number]   Chief Complaint: Medical Management of Chronic Issues   HPI:  1. Type 2 diabetes mellitus with other specified complication, without long-term current use of insulin (HCC) last hgba1c was 6.1%. blood sugars have been below 120 fasting. No hypoglycemia.he has been trying to watch his diet.  2. Essential hypertension, benign  No c/o chest pain, sob or headache. Does not check bloodpressure at home. BP Readings from Last 3 Encounters:  01/11/19 132/79  10/11/18 (!) 145/89  07/09/18 130/86     3. Hyperlipidemia with target LDL less than 100  Tries to avoid fried foods. But is nit able to exercise due to his pain.  4. Fibromyalgia  He has pain daily- see pain management monthly  5. Chronic midline low back pain with sciatica, sciatica laterality unspecified No better but sees pan management   6. Vitamin D deficiency  Takes a dialy vitamin d supplement  7. BMI 30.0-30.9,adult  Has lost weight over the last 6 months  8. Smoker  Smokes aboiut 1 pack a day. Says he has hard time quitting cause he says it helps with his pain.    Outpatient Encounter Medications as of 01/11/2019  Medication Sig  . aspirin EC 81 MG tablet Take 1 tablet (81 mg total) by mouth daily.  . cyclobenzaprine (FLEXERIL) 10 MG tablet TAKE 1 TABLET BY MOUTH THREE TIMES A DAY IF NEEDED FOR MUSCLE SPASMS  . felodipine (PLENDIL) 5 MG 24 hr tablet take 1 tablet by mouth once daily  . fenofibrate 160 MG tablet take 1 tablet by mouth once daily  . glucose blood test strip TrueTrack strips. Test BS qd. Dx E11.9  . hydrochlorothiazide (HYDRODIURIL) 25 MG tablet take 1 tablet by mouth once daily  . Lancet Devices (ACCU-CHEK SOFTCLIX) lancets Use as instructed  . losartan (COZAAR) 100 MG tablet take 1 tablet by mouth once daily  . metFORMIN (GLUCOPHAGE) 1000 MG tablet Take 1 tablet (1,000 mg total) by mouth 2 (two) times daily with  a meal.  . rosuvastatin (CRESTOR) 10 MG tablet Take 1 tablet (10 mg total) by mouth daily.  . sitaGLIPtin (JANUVIA) 100 MG tablet Take 1 tablet (100 mg total) by mouth daily.      New complaints: None today  Social history: Is on disability. His wife only works 2 days a week.   Review of Systems  Constitutional: Negative for activity change and appetite change.  HENT: Negative.   Eyes: Negative for pain.  Respiratory: Negative for shortness of breath.   Cardiovascular: Negative for chest pain, palpitations and leg swelling.  Gastrointestinal: Negative for abdominal pain.  Endocrine: Negative for polydipsia.  Genitourinary: Negative.   Skin: Negative for rash.  Neurological: Negative for dizziness, weakness and headaches.  Hematological: Does not bruise/bleed easily.  Psychiatric/Behavioral: Negative.   All other systems reviewed and are negative.      Objective:   Physical Exam Vitals signs and nursing note reviewed.  Constitutional:      Appearance: Normal appearance. He is well-developed.  HENT:     Head: Normocephalic.     Nose: Nose normal.  Eyes:     Pupils: Pupils are equal, round, and reactive to light.  Neck:     Musculoskeletal: Normal range of motion and neck supple.     Thyroid: No thyroid mass or thyromegaly.     Vascular: No carotid bruit or JVD.  Trachea: Phonation normal.  Cardiovascular:     Rate and Rhythm: Normal rate and regular rhythm.  Pulmonary:     Effort: Pulmonary effort is normal. No respiratory distress.     Breath sounds: Normal breath sounds.  Abdominal:     General: Bowel sounds are normal.     Palpations: Abdomen is soft.     Tenderness: There is no abdominal tenderness.  Musculoskeletal: Normal range of motion.  Lymphadenopathy:     Cervical: No cervical adenopathy.  Skin:    General: Skin is warm and dry.  Neurological:     Mental Status: He is alert and oriented to person, place, and time.  Psychiatric:         Behavior: Behavior normal.        Thought Content: Thought content normal.        Judgment: Judgment normal.    BP 132/79   Pulse 65   Temp (!) 97.4 F (36.3 C) (Oral)   Ht _0  (1.778 m)   Wt 201 lb (91.2 kg)   BMI 28.84 kg/m   hgba1c 6.6%    Assessment & Plan:  Michael Valenzuela comes in today with chief complaint of Medical Management of Chronic Issues   Diagnosis and orders addressed:  1. Type 2 diabetes mellitus with other specified complication, without long-term current use of insulin (HCC) continue to watch carbs on diet - Bayer DCA Hb A1c Waived - sitaGLIPtin (JANUVIA) 100 MG tablet; Take 1 tablet (100 mg total) by mouth daily.  Dispense: 90 tablet; Refill: 1 - metFORMIN (GLUCOPHAGE) 1000 MG tablet; Take 1 tablet (1,000 mg total) by mouth 2 (two) times daily with a meal.  Dispense: 180 tablet; Refill: 1  2. Essential hypertension, benign Low sodium diet - CMP14+EGFR - losartan (COZAAR) 100 MG tablet; Take 1 tablet (100 mg total) by mouth daily.  Dispense: 90 tablet; Refill: 1 - hydrochlorothiazide (HYDRODIURIL) 25 MG tablet; Take 1 tablet (25 mg total) by mouth daily.  Dispense: 90 tablet; Refill: 1 - felodipine (PLENDIL) 5 MG 24 hr tablet; Take 1 tablet (5 mg total) by mouth daily.  Dispense: 90 tablet; Refill: 1  3. Hyperlipidemia with target LDL less than 100 Low fat diet - Lipid panel - rosuvastatin (CRESTOR) 10 MG tablet; Take 1 tablet (10 mg total) by mouth daily.  Dispense: 90 tablet; Refill: 1 - fenofibrate 160 MG tablet; Take 1 tablet (160 mg total) by mouth daily.  Dispense: 90 tablet; Refill: 1  4. Fibromyalgia Continue pain management'daily exercise should help with pain  5. Chronic midline low back pain with sciatica, sciatica laterality unspecified  6. Vitamin D deficiency Continue daily vitamin d supplement  7. BMI 28.0-28.9,adult Discussed diet and exercise for person with BMI >25 Will recheck weight in 3-6 months  8. Smoker Smoking cessation  discussed    Labs pending Health Maintenance reviewed Diet and exercise encouraged  Follow up plan: 6 months   [name] Done, FNP

## 2019-01-12 LAB — CMP14+EGFR
ALT: 37 IU/L (ref 0–44)
AST: 38 IU/L (ref 0–40)
Albumin/Globulin Ratio: 1.7 (ref 1.2–2.2)
Albumin: 4.4 g/dL (ref 3.8–4.8)
Alkaline Phosphatase: 46 IU/L (ref 39–117)
BUN/Creatinine Ratio: 22 (ref 10–24)
BUN: 19 mg/dL (ref 8–27)
Bilirubin Total: <0.2 mg/dL (ref 0.0–1.2)
CO2: 23 mmol/L (ref 20–29)
Calcium: 10 mg/dL (ref 8.6–10.2)
Chloride: 103 mmol/L (ref 96–106)
Creatinine, Ser: 0.86 mg/dL (ref 0.76–1.27)
GFR calc Af Amer: 107 mL/min/{1.73_m2} (ref >59)
GFR calc non Af Amer: 92 mL/min/{1.73_m2} (ref >59)
Globulin, Total: 2.6 g/dL (ref 1.5–4.5)
Glucose: 144 mg/dL — ABNORMAL HIGH (ref 65–99)
Potassium: 4.5 mmol/L (ref 3.5–5.2)
Sodium: 141 mmol/L (ref 134–144)
Total Protein: 7 g/dL (ref 6.0–8.5)

## 2019-01-12 LAB — LIPID PANEL
Chol/HDL Ratio: 3.6 ratio (ref 0.0–5.0)
Cholesterol, Total: 139 mg/dL (ref 100–199)
HDL: 39 mg/dL — ABNORMAL LOW (ref >39)
LDL Calculated: 37 mg/dL (ref 0–99)
Triglycerides: 313 mg/dL — ABNORMAL HIGH (ref 0–149)
VLDL Cholesterol Cal: 63 mg/dL — ABNORMAL HIGH (ref 5–40)

## 2019-01-24 ENCOUNTER — Ambulatory Visit (INDEPENDENT_AMBULATORY_CARE_PROVIDER_SITE_OTHER): Payer: Medicare Other | Admitting: *Deleted

## 2019-01-24 ENCOUNTER — Other Ambulatory Visit: Payer: Self-pay

## 2019-01-24 VITALS — BP 138/87 | HR 67 | Ht 70.0 in | Wt 204.8 lb

## 2019-01-24 DIAGNOSIS — Z0000 Encounter for general adult medical examination without abnormal findings: Secondary | ICD-10-CM | POA: Diagnosis not present

## 2019-01-24 NOTE — Progress Notes (Addendum)
Subjective:   Michael Valenzuela is a 63 y.o. male who presents for Medicare Annual/Subsequent preventive examination.  Michael Valenzuela has been on disability since 2010 due to fibromyalgia. He lives at home with his wife Michael Valenzuela) of 51 years, his dog Medical laboratory scientific officer) and Neurosurgeon (queen baby). He also has 2 children and 1 grandchild that he sees often.  Michael Valenzuela does not exercise and does not have any hobbies at this time due to the pain from fibromyalgia.  He use to love playing baseball or any sport until the fibromyalgia worsened. Michael Valenzuela only eat 1 to 2 meals daily. He has had no falls in the past year.  Michael Valenzuela has also not had any hospitalizations or surgeries in the past year.  Michael Valenzuela feels that his health is worse now than it was a year ago due to fibromyalgia, chronic neck pain and back pain.       Objective:    Vitals: BP 138/87   Pulse 67   Ht 5\' 10"  (1.778 m)   Wt 204 lb 12.8 oz (92.9 kg)   BMI 29.39 kg/m   Body mass index is 29.39 kg/m.  Advanced Directives 01/24/2019 12/31/2017 09/11/2014  Does Patient Have a Medical Advance Directive? No No No  Would patient like information on creating a medical advance directive? Yes (MAU/Ambulatory/Procedural Areas - Information given) No - Patient declined Yes - Educational materials given    Tobacco Social History   Tobacco Use  Smoking Status Current Every Day Smoker  . Packs/day: 0.50  . Types: Cigarettes  Smokeless Tobacco Never Used     Ready to quit: No Counseling given: Yes   Clinical Intake:  Pre-visit preparation completed: Yes  Pain : 0-10 Pain Score: 6  Pain Type: Chronic pain Pain Location: Generalized Pain Descriptors / Indicators: Aching, Constant Pain Onset: More than a month ago Pain Frequency: Constant Pain Relieving Factors: Oxycodone  Pain Relieving Factors: Oxycodone  BMI - recorded: 29.39 Nutritional Status: BMI 25 -29 Overweight Nutritional Risks: None Diabetes: Yes CBG done?: No Did pt. bring in CBG monitor from  home?: No  How often do you need to have someone help you when you read instructions, pamphlets, or other written materials from your doctor or pharmacy?: 1 - Never What is the last grade level you completed in school?: High School Graduate, Some college  Interpreter Needed?: No  Information entered by :: Truett Mainland, LPN  Past Medical History:  Diagnosis Date  . Chronic low back pain   . Diabetes mellitus without complication ([place])   . Fibromyalgia   . Hyperlipidemia   . Hypertension   . Vitamin D deficiency    Past Surgical History:  Procedure Laterality Date  . HERNIA REPAIR     Family History  Problem Relation Age of Onset  . Heart disease Mother   . Diabetes Mother   . Cancer Mother   . Heart disease Father   . Diabetes Son 15       type 1  . Diabetes Paternal Uncle   . Heart attack Brother   . Hypertension Son    Social History   Socioeconomic History  . Marital status: Married    Spouse name: Michael Valenzuela  . Number of children: 2  . Years of education: 61  . Highest education level: Some college, no degree  Occupational History  . Occupation: Retired    Fish farm manager: [employer]  . Financial resource strain: Not hard at all  .  Food insecurity:    Worry: Never true    Inability: Never true  . Transportation needs:    Medical: No    Non-medical: No  Tobacco Use  . Smoking status: Current Every Day Smoker    Packs/day: 0.50    Types: Cigarettes  . Smokeless tobacco: Never Used  Substance and Sexual Activity  . Alcohol use: Yes    Comment: occasional  . Drug use: No  . Sexual activity: Not Currently  Lifestyle  . Physical activity:    Days per week: 0 days    Minutes per session: 0 min  . Stress: Only a little  Relationships  . Social connections:    Talks on phone: Three times a week    Gets together: Twice a week    Attends religious service: Never    Active member of club or organization: No    Attends meetings of clubs or  organizations: Never    Relationship status: Married  Other Topics Concern  . Not on file  Social History Narrative   Patient has fibromyalgia and has significant pain with that. He has two adult children and is married. He has one grandchild.     Outpatient Encounter Medications as of 01/24/2019  Medication Sig  . aspirin EC 81 MG tablet Take 1 tablet (81 mg total) by mouth daily.  . cyclobenzaprine (FLEXERIL) 10 MG tablet TAKE 1 TABLET BY MOUTH THREE TIMES A DAY IF NEEDED FOR MUSCLE SPASMS  . felodipine (PLENDIL) 5 MG 24 hr tablet Take 1 tablet (5 mg total) by mouth daily.  . fenofibrate 160 MG tablet Take 1 tablet (160 mg total) by mouth daily.  Marland Kitchen glucose blood test strip TrueTrack strips. Test BS qd. Dx E11.9  . hydrochlorothiazide (HYDRODIURIL) 25 MG tablet Take 1 tablet (25 mg total) by mouth daily.  Elmore Guise Devices (ACCU-CHEK SOFTCLIX) lancets Use as instructed  . losartan (COZAAR) 100 MG tablet Take 1 tablet (100 mg total) by mouth daily.  . metFORMIN (GLUCOPHAGE) 1000 MG tablet Take 1 tablet (1,000 mg total) by mouth 2 (two) times daily with a meal.  . oxyCODONE (ROXICODONE) 15 MG immediate release tablet TK 1 T PO Q 6 H  . rosuvastatin (CRESTOR) 10 MG tablet Take 1 tablet (10 mg total) by mouth daily.  . sitaGLIPtin (JANUVIA) 100 MG tablet Take 1 tablet (100 mg total) by mouth daily.   No facility-administered encounter medications on file as of 01/24/2019.     Activities of Daily Living In your present state of health, do you have any difficulty performing the following activities: 01/24/2019 01/24/2019  Hearing? N N  Vision? N N  Difficulty concentrating or making decisions? Michael Valenzuela  Walking or climbing stairs? N N  Dressing or bathing? N N  Doing errands, shopping? N N  Some recent data might be hidden    Patient Care Team: Chevis Pretty, FNP as PCP - General (Nurse Practitioner) [facility] ([facility] Medicine)   Assessment:   This is a routine  wellness examination for Michael Valenzuela.  Exercise Activities and Dietary recommendations Current Exercise Habits: The patient does not participate in regular exercise at present  Goals    . Exercise 150 min/wk Moderate Activity       Fall Risk Fall Risk  01/24/2019 01/11/2019 10/11/2018 07/09/2018 04/08/2018  Falls in the past year? 0 0 0 No No   Is the patient's home free of loose throw rugs in walkways, pet beds, electrical cords, etc?  yes      Grab bars in the bathroom? yes      Handrails on the stairs?   yes      Adequate lighting?   yes  Timed Get Up and Go Performed:   Depression Screen PHQ 2/9 Scores 01/24/2019 01/11/2019 10/11/2018 07/09/2018  PHQ - 2 Score 1 1 0 0  PHQ- 9 Score - - - -  Exception Documentation - - - -  No significant depression noted at this time  Cognitive Function MMSE - Mini Mental State Exam 01/24/2019 12/31/2017  Orientation to time 5 5  Orientation to Place 5 5  Registration 3 3  Attention/ Calculation 4 4  Recall 2 2  Language- name 2 objects 2 2  Language- repeat 1 1  Language- follow 3 step command 3 3  Language- read & follow direction 1 1  Write a sentence 1 1  Copy design 1 0  Total score 28 27        Immunization History  Administered Date(s) Administered  . Influenza,inj,Quad PF,6+ Mos 10/06/2016, 10/08/2017, 10/11/2018  . Pneumococcal Conjugate-13 12/31/2015  . Pneumococcal Polysaccharide-23 01/06/2017  . Tdap 03/19/2015  . Tetanus 06/25/2004  . Zoster 09/25/2015    Qualifies for Shingles Vaccine? Yes, declines shingrix at this time.   Screening Tests Health Maintenance  Topic Date Due  . HIV Screening  07/10/2019 (Originally 06/19/1971)  . HEMOGLOBIN A1C  07/14/2019  . OPHTHALMOLOGY EXAM  09/15/2019  . FOOT EXAM  01/11/2020  . COLONOSCOPY  03/17/2022  . TETANUS/TDAP  03/18/2025  . INFLUENZA VACCINE  Completed  . PNEUMOCOCCAL POLYSACCHARIDE VACCINE AGE 58-64 HIGH RISK  Completed  . Hepatitis C Screening  Completed   Cancer  Screenings: Lung: Low Dose CT Chest recommended if Age 65-80 years, 30 pack-year currently smoking OR have quit w/in 15years. Patient does qualify. Colorectal: Up to date  Additional Screenings: Hepatitis C Screening:      Plan:   Encouraged Mr. Jubb to try to eat at least 3 meals daily that consist of lean proteins, fresh fruits and fresh vegetables.  Encouraged to try to exercise for at least 30 minutes, 3 times weekly. Explained the importance of smoking cessation and hand out given. Explained the importance of advance directive and encouraged Mr Lammert to look over and discuss with family.  Mr. Flahive is up to date on all health maintenance except for shingrix and patient declined today, will discuss with PCP at next visit.  Encouraged to keep follow up with [name] Done, FNP on 04/14/2019.   I have personally reviewed and noted the following in the patient's chart:   . Medical and social history . Use of alcohol, tobacco or illicit drugs  . Current medications and supplements . Functional ability and status . Nutritional status . Physical activity . Advanced directives . List of other physicians . Hospitalizations, surgeries, and ER visits in previous 12 months . Vitals . Screenings to include cognitive, depression, and falls . Referrals and appointments  In addition, I have reviewed and discussed with patient certain preventive protocols, quality metrics, and best practice recommendations. A written personalized care plan for preventive services as well as general preventive health recommendations were provided to patient.     Wardell Heath, LPN  4/58/0998 I have reviewed and agree with the above AWV documentation.   [name] Done, FNP

## 2019-01-24 NOTE — Patient Instructions (Signed)
Michael Valenzuela , Thank you for taking time to come for your Medicare Wellness Visit. I appreciate your ongoing commitment to your health goals. Please review the following plan we discussed and let me know if I can assist you in the future.   These are the goals we discussed: Goals     Exercise 150 min/wk Moderate Activity       This is a list of the screening recommended for you and due dates:  Health Maintenance  Topic Date Due   HIV Screening  07/10/2019*   Hemoglobin A1C  07/14/2019   Eye exam for diabetics  09/15/2019   Complete foot exam   01/11/2020   Colon Cancer Screening  03/17/2022   Tetanus Vaccine  03/18/2025   Flu Shot  Completed   Pneumococcal vaccine  Completed    Hepatitis C: One time screening is recommended by Center for Disease Control  (CDC) for  adults born from [birth year] through 1966.   Completed  *Topic was postponed. The date shown is not the original due date.       Steps to Quit Smoking  Smoking tobacco can be bad for your health. It can also affect almost every organ in your body. Smoking puts you and people around you at risk for many serious long-lasting (chronic) diseases. Quitting smoking is hard, but it is one of the best things that you can do for your health. It is never too late to quit. What are the benefits of quitting smoking? When you quit smoking, you lower your risk for getting serious diseases and conditions. They can include:  Lung cancer or lung disease.  Heart disease.  Stroke.  Heart attack.  Not being able to have children (infertility).  Weak bones (osteoporosis) and broken bones (fractures). If you have coughing, wheezing, and shortness of breath, those symptoms may get better when you quit. You may also get sick less often. If you are pregnant, quitting smoking can help to lower your chances of having a baby of low birth weight. What can I do to help me quit smoking? Talk with your doctor about what can help you quit  smoking. Some things you can do (strategies) include:  Quitting smoking totally, instead of slowly cutting back how much you smoke over a period of time.  Going to in-person counseling. You are more likely to quit if you go to many counseling sessions.  Using resources and support systems, such as: ? Database administrator with a Social worker. ? Phone quitlines. ? Careers information officer. ? Support groups or group counseling. ? Text messaging programs. ? Mobile phone apps or applications.  Taking medicines. Some of these medicines may have nicotine in them. If you are pregnant or breastfeeding, do not take any medicines to quit smoking unless your doctor says it is okay. Talk with your doctor about counseling or other things that can help you. Talk with your doctor about using more than one strategy at the same time, such as taking medicines while you are also going to in-person counseling. This can help make quitting easier. What things can I do to make it easier to quit? Quitting smoking might feel very hard at first, but there is a lot that you can do to make it easier. Take these steps:  Talk to your family and friends. Ask them to support and encourage you.  Call phone quitlines, reach out to support groups, or work with a Social worker.  Ask people who smoke to not smoke around  you.  Avoid places that make you want (trigger) to smoke, such as: ? Bars. ? Parties. ? Smoke-break areas at work.  Spend time with people who do not smoke.  Lower the stress in your life. Stress can make you want to smoke. Try these things to help your stress: ? Getting regular exercise. ? Deep-breathing exercises. ? Yoga. ? Meditating. ? Doing a body scan. To do this, close your eyes, focus on one area of your body at a time from head to toe, and notice which parts of your body are tense. Try to relax the muscles in those areas.  Download or buy apps on your mobile phone or tablet that can help you stick to  your quit plan. There are many free apps, such as QuitGuide from the [employer] Office manager for Disease Control and Prevention). You can find more support from smokefree.gov and other websites. This information is not intended to replace advice given to you by your health care provider. Make sure you discuss any questions you have with your health care provider. Document Released: 08/23/2009 Document Revised: 06/24/2016 Document Reviewed: 03/13/2015 Elsevier Interactive Patient Education  2019 [place] Directive  Advance directives are legal documents that let you make choices ahead of time about your health care and medical treatment in case you become unable to communicate for yourself. Advance directives are a way for you to communicate your wishes to family, friends, and health care providers. This can help convey your decisions about end-of-life care if you become unable to communicate. Discussing and writing advance directives should happen over time rather than all at once. Advance directives can be changed depending on your situation and what you want, even after you have signed the advance directives. If you do not have an advance directive, some states assign family decision makers to act on your behalf based on how closely you are related to them. Each state has its own laws regarding advance directives. You may want to check with your health care provider, attorney, or state representative about the laws in your state. There are different types of advance directives, such as:  Medical power of attorney.  Living will.  Do not resuscitate (DNR) or do not attempt resuscitation (DNAR) order. Health care proxy and medical power of attorney A health care proxy, also called a health care agent, is a person who is appointed to make medical decisions for you in cases in which you are unable to make the decisions yourself. Generally, people choose someone they know well and trust to  represent their preferences. Make sure to ask this person for an agreement to act as your proxy. A proxy may have to exercise judgment in the event of a medical decision for which your wishes are not known. A medical power of attorney is a legal document that names your health care proxy. Depending on the laws in your state, after the document is written, it may also need to be:  Signed.  Notarized.  Dated.  Copied.  Witnessed.  Incorporated into your medical record. You may also want to appoint someone to manage your financial affairs in a situation in which you are unable to do so. This is called a durable power of attorney for finances. It is a separate legal document from the durable power of attorney for health care. You may choose the same person or someone different from your health care proxy to act as your agent in financial matters. If you  do not appoint a proxy, or if there is a concern that the proxy is not acting in your best interests, a court-appointed guardian may be designated to act on your behalf. Living will A living will is a set of instructions documenting your wishes about medical care when you cannot express them yourself. Health care providers should keep a copy of your living will in your medical record. You may want to give a copy to family members or friends. To alert caregivers in case of an emergency, you can place a card in your wallet to let them know that you have a living will and where they can find it. A living will is used if you become:  Terminally ill.  Incapacitated.  Unable to communicate or make decisions. Items to consider in your living will include:  The use or non-use of life-sustaining equipment, such as dialysis machines and breathing machines (ventilators).  A DNR or DNAR order, which is the instruction not to use cardiopulmonary resuscitation (CPR) if breathing or heartbeat stops.  The use or non-use of tube feeding.  Withholding of  food and fluids.  Comfort (palliative) care when the goal becomes comfort rather than a cure.  Organ and tissue donation. A living will does not give instructions for distributing your money and property if you should pass away. It is recommended that you seek the advice of a lawyer when writing a will. Decisions about taxes, beneficiaries, and asset distribution will be legally binding. This process can relieve your family and friends of any concerns surrounding disputes or questions that may come up about the distribution of your assets. DNR or DNAR A DNR or DNAR order is a request not to have CPR in the event that your heart stops beating or you stop breathing. If a DNR or DNAR order has not been made and shared, a health care provider will try to help any patient whose heart has stopped or who has stopped breathing. If you plan to have surgery, talk with your health care provider about how your DNR or DNAR order will be followed if problems occur. Summary  Advance directives are the legal documents that allow you to make choices ahead of time about your health care and medical treatment in case you become unable to communicate for yourself.  The process of discussing and writing advance directives should happen over time. You can change the advance directives, even after you have signed them.  Advance directives include DNR or DNAR orders, living wills, and designating an agent as your medical power of attorney. This information is not intended to replace advice given to you by your health care provider. Make sure you discuss any questions you have with your health care provider. Document Released: 02/03/2008 Document Revised: 09/15/2016 Document Reviewed: 09/15/2016 Elsevier Interactive Patient Education  2019 [employer].

## 2019-01-26 ENCOUNTER — Other Ambulatory Visit: Payer: Self-pay | Admitting: *Deleted

## 2019-01-26 MED ORDER — GLUCOSE BLOOD VI STRP: ORAL_STRIP | 2 refills | Status: DC

## 2019-02-08 DIAGNOSIS — M545 Low back pain: Secondary | ICD-10-CM | POA: Diagnosis not present

## 2019-02-08 DIAGNOSIS — M79661 Pain in right lower leg: Secondary | ICD-10-CM | POA: Diagnosis not present

## 2019-02-08 DIAGNOSIS — M79662 Pain in left lower leg: Secondary | ICD-10-CM | POA: Diagnosis not present

## 2019-02-08 DIAGNOSIS — M797 Fibromyalgia: Secondary | ICD-10-CM | POA: Diagnosis not present

## 2019-02-08 DIAGNOSIS — G894 Chronic pain syndrome: Secondary | ICD-10-CM | POA: Diagnosis not present

## 2019-02-08 DIAGNOSIS — M542 Cervicalgia: Secondary | ICD-10-CM | POA: Diagnosis not present

## 2019-02-08 DIAGNOSIS — M25512 Pain in left shoulder: Secondary | ICD-10-CM | POA: Diagnosis not present

## 2019-02-08 DIAGNOSIS — M25511 Pain in right shoulder: Secondary | ICD-10-CM | POA: Diagnosis not present

## 2019-02-11 ENCOUNTER — Other Ambulatory Visit: Payer: Self-pay | Admitting: Nurse Practitioner

## 2019-03-02 ENCOUNTER — Encounter: Payer: Self-pay | Admitting: Nurse Practitioner

## 2019-03-02 ENCOUNTER — Other Ambulatory Visit: Payer: Self-pay

## 2019-03-02 ENCOUNTER — Ambulatory Visit (INDEPENDENT_AMBULATORY_CARE_PROVIDER_SITE_OTHER): Payer: Medicare Other | Admitting: Nurse Practitioner

## 2019-03-02 DIAGNOSIS — H938X1 Other specified disorders of right ear: Secondary | ICD-10-CM

## 2019-03-02 MED ORDER — FLUTICASONE PROPIONATE 50 MCG/ACT NA SUSP: 2.0000 | Freq: Every day | NASAL | 6 refills | Status: DC

## 2019-03-02 NOTE — Progress Notes (Signed)
Patient ID: Michael Valenzuela, male   DOB: 23-Feb-1956, 63 y.o.   MRN: [id number]    Virtual Visit via telephone Note  I connected with Michael Valenzuela on 03/02/19 at 2:40PM by telephone and verified that I am speaking with the correct person using two identifiers. Michael Valenzuela is currently located at home and his wife is currently with her during visit. The provider, [name] Done, FNP is located in their office at time of visit.  I discussed the limitations, risks, security and privacy concerns of performing an evaluation and management service by telephone and the availability of in person appointments. I also discussed with the patient that there may be a patient responsible charge related to this service. The patient expressed understanding and agreed to proceed.   History and Present Illness:   Chief Complaint: Ear Pain   HPI Patient calls in today saying his right ear is stooped up. He has tried dayquil, mucinex and equate allergy relief . Says sounds like people are talking through a barrel. Denies pain.     Review of Systems  HENT: Positive for hearing loss (slight right ear). Negative for ear discharge and ear pain.   Eyes: Negative.   Respiratory: Negative.   Cardiovascular: Negative.   Genitourinary: Negative.   Neurological: Negative.   Psychiatric/Behavioral: Negative.   All other systems reviewed and are negative.    Observations/Objective: Alert and oriented - answers all questions appropriately No distress noted  Assessment and Plan: Michael Valenzuela in today with chief complaint of Ear Pain   1. Ear congestion, right Meds ordered this encounter  Medications  . fluticasone (FLONASE) 50 MCG/ACT nasal spray    Sig: Place 2 sprays into both nostrils daily.    Dispense:  16 g    Refill:  6    Order Specific Question:   Supervising Provider    Answer:   Caryl Pina A [id number]   Force fluids OTC econgestant   Follow Up Instructions: Call if no better in 3  days   I discussed the assessment and treatment plan with the patient. The patient was provided an opportunity to ask questions and all were answered. The patient agreed with the plan and demonstrated an understanding of the instructions.   The patient was advised to call back or seek an in-person evaluation if the symptoms worsen or if the condition fails to improve as anticipated.  The above assessment and management plan was discussed with the patient. The patient verbalized understanding of and has agreed to the management plan. Patient is aware to call the clinic if symptoms persist or worsen. Patient is aware when to return to the clinic for a follow-up visit. Patient educated on when it is appropriate to go to the emergency department.    I provided 7 minutes of non-face-to-face time during this encounter.    [name] Done, FNP

## 2019-03-02 NOTE — Progress Notes (Deleted)
Patient ID: Michael Valenzuela, male   DOB: November 03, 1956, 63 y.o.   MRN: [id number]

## 2019-03-08 DIAGNOSIS — M79662 Pain in left lower leg: Secondary | ICD-10-CM | POA: Diagnosis not present

## 2019-03-08 DIAGNOSIS — M25511 Pain in right shoulder: Secondary | ICD-10-CM | POA: Diagnosis not present

## 2019-03-08 DIAGNOSIS — M25512 Pain in left shoulder: Secondary | ICD-10-CM | POA: Diagnosis not present

## 2019-03-08 DIAGNOSIS — M797 Fibromyalgia: Secondary | ICD-10-CM | POA: Diagnosis not present

## 2019-03-08 DIAGNOSIS — M79661 Pain in right lower leg: Secondary | ICD-10-CM | POA: Diagnosis not present

## 2019-03-08 DIAGNOSIS — M542 Cervicalgia: Secondary | ICD-10-CM | POA: Diagnosis not present

## 2019-03-08 DIAGNOSIS — M545 Low back pain: Secondary | ICD-10-CM | POA: Diagnosis not present

## 2019-03-08 DIAGNOSIS — G894 Chronic pain syndrome: Secondary | ICD-10-CM | POA: Diagnosis not present

## 2019-04-06 DIAGNOSIS — M545 Low back pain: Secondary | ICD-10-CM | POA: Diagnosis not present

## 2019-04-06 DIAGNOSIS — M79662 Pain in left lower leg: Secondary | ICD-10-CM | POA: Diagnosis not present

## 2019-04-06 DIAGNOSIS — M25511 Pain in right shoulder: Secondary | ICD-10-CM | POA: Diagnosis not present

## 2019-04-06 DIAGNOSIS — M797 Fibromyalgia: Secondary | ICD-10-CM | POA: Diagnosis not present

## 2019-04-06 DIAGNOSIS — M79661 Pain in right lower leg: Secondary | ICD-10-CM | POA: Diagnosis not present

## 2019-04-06 DIAGNOSIS — M25512 Pain in left shoulder: Secondary | ICD-10-CM | POA: Diagnosis not present

## 2019-04-06 DIAGNOSIS — G894 Chronic pain syndrome: Secondary | ICD-10-CM | POA: Diagnosis not present

## 2019-04-09 ENCOUNTER — Other Ambulatory Visit: Payer: Self-pay | Admitting: Nurse Practitioner

## 2019-04-13 ENCOUNTER — Other Ambulatory Visit: Payer: Self-pay

## 2019-04-14 ENCOUNTER — Other Ambulatory Visit: Payer: Self-pay

## 2019-04-14 ENCOUNTER — Ambulatory Visit (INDEPENDENT_AMBULATORY_CARE_PROVIDER_SITE_OTHER): Payer: Medicare Other | Admitting: Nurse Practitioner

## 2019-04-14 ENCOUNTER — Encounter: Payer: Self-pay | Admitting: Nurse Practitioner

## 2019-04-14 VITALS — BP 161/99 | HR 89 | Temp 98.3°F | Ht 70.0 in | Wt 204.0 lb

## 2019-04-14 DIAGNOSIS — E785 Hyperlipidemia, unspecified: Secondary | ICD-10-CM | POA: Diagnosis not present

## 2019-04-14 DIAGNOSIS — Z125 Encounter for screening for malignant neoplasm of prostate: Secondary | ICD-10-CM | POA: Diagnosis not present

## 2019-04-14 DIAGNOSIS — M5440 Lumbago with sciatica, unspecified side: Secondary | ICD-10-CM

## 2019-04-14 DIAGNOSIS — G8929 Other chronic pain: Secondary | ICD-10-CM

## 2019-04-14 DIAGNOSIS — I10 Essential (primary) hypertension: Secondary | ICD-10-CM | POA: Diagnosis not present

## 2019-04-14 DIAGNOSIS — E559 Vitamin D deficiency, unspecified: Secondary | ICD-10-CM | POA: Diagnosis not present

## 2019-04-14 DIAGNOSIS — Z6830 Body mass index (BMI) 30.0-30.9, adult: Secondary | ICD-10-CM

## 2019-04-14 DIAGNOSIS — M797 Fibromyalgia: Secondary | ICD-10-CM | POA: Diagnosis not present

## 2019-04-14 DIAGNOSIS — F17200 Nicotine dependence, unspecified, uncomplicated: Secondary | ICD-10-CM | POA: Diagnosis not present

## 2019-04-14 DIAGNOSIS — E1169 Type 2 diabetes mellitus with other specified complication: Secondary | ICD-10-CM

## 2019-04-14 LAB — BAYER DCA HB A1C WAIVED: HB A1C (BAYER DCA - WAIVED): 6.4 % (ref <7.0)

## 2019-04-14 MED ORDER — FELODIPINE ER 10 MG PO TB24: 10.0000 mg | ORAL_TABLET | Freq: Every day | ORAL | 1 refills | Status: DC

## 2019-04-14 NOTE — Progress Notes (Addendum)
Subjective:    Patient ID: Michael Valenzuela, male    DOB: December 28, 1955, 63 y.o.   MRN: [id number]   Chief Complaint: medical management of chronic issues   HPI:  1. Essential hypertension, benign No c/o chest pain, sob or headache. Does not check blood pressure at home. BP Readings from Last 3 Encounters:  01/24/19 138/87  01/11/19 132/79  10/11/18 (!) 145/89     2. Type 2 diabetes mellitus with other specified complication, without long-term current use of insulin (HCC) Last HGBA1c was 6.6%. fasting blood sugars have been running 100-110 up until th elast 2 weeks and they have been averaging 140.  3. Hyperlipidemia with target LDL less than 100 Does not really watch diet and denies any exercise due to his chronic back pain  4. Fibromyalgia Hurts all the time. Rates pain 6/10 most days. Sees pain management monthly  5. Chronic midline low back pain with sciatica, sciatica laterality unspecified Has chronic back pain. Walks with limp when in office.   6. Vitamin D deficiency Takes daily vitamin d supplement  7. Smoker Smokes over a pack a day  8. BMI 30.0-30.9,adult No recent weight changes    Outpatient Encounter Medications as of 04/14/2019  Medication Sig  . aspirin EC 81 MG tablet Take 1 tablet (81 mg total) by mouth daily.  . cyclobenzaprine (FLEXERIL) 10 MG tablet TAKE 1 TABLET BY MOUTH THREE TIMES DAILY AS NEEDED FOR MUSCLE SPASMS  . felodipine (PLENDIL) 5 MG 24 hr tablet Take 1 tablet (5 mg total) by mouth daily.  . fenofibrate 160 MG tablet Take 1 tablet (160 mg total) by mouth daily.  . fluticasone (FLONASE) 50 MCG/ACT nasal spray Place 2 sprays into both nostrils daily.  Marland Kitchen glucose blood test strip TrueTrack strips. Test BS qd. Dx E11.9  . hydrochlorothiazide (HYDRODIURIL) 25 MG tablet Take 1 tablet (25 mg total) by mouth daily.  Elmore Gu[employer] Devices (ACCU-CHEK SOFTCLIX) lancets Use as instructed  . losartan (COZAAR) 100 MG tablet Take 1 tablet (100 mg total) by mouth  daily.  . metFORMIN (GLUCOPHAGE) 1000 MG tablet Take 1 tablet (1,000 mg total) by mouth 2 (two) times daily with a meal.  . oxyCODONE (ROXICODONE) 15 MG immediate release tablet TK 1 T PO Q 6 H  . rosuvastatin (CRESTOR) 10 MG tablet Take 1 tablet (10 mg total) by mouth daily.  . sitaGLIPtin (JANUVIA) 100 MG tablet Take 1 tablet (100 mg total) by mouth daily.       New complaints: None today  Social history: Lives with wife who is also on disability     Review of Systems  Constitutional: Negative for activity change and appetite change.  HENT: Negative.   Eyes: Negative for pain.  Respiratory: Negative for shortness of breath.   Cardiovascular: Negative for chest pain, palpitations and leg swelling.  Gastrointestinal: Negative for abdominal pain.  Endocrine: Negative for polydipsia.  Genitourinary: Negative.   Skin: Negative for rash.  Neurological: Negative for dizziness, weakness and headaches.  Hematological: Does not bruise/bleed easily.  Psychiatric/Behavioral: Negative.   All other systems reviewed and are negative.      Objective:   Physical Exam Vitals signs and nursing note reviewed.  Constitutional:      Appearance: Normal appearance. He is well-developed.  HENT:     Head: Normocephalic.     Nose: Nose normal.  Eyes:     Pupils: Pupils are equal, round, and reactive to light.  Neck:     Musculoskeletal: Normal  range of motion and neck supple.     Thyroid: No thyroid mass or thyromegaly.     Vascular: No carotid bruit or JVD.     Trachea: Phonation normal.  Cardiovascular:     Rate and Rhythm: Normal rate and regular rhythm.  Pulmonary:     Effort: Pulmonary effort is normal. No respiratory distress.     Breath sounds: Normal breath sounds.  Abdominal:     General: Bowel sounds are normal.     Palpations: Abdomen is soft.     Tenderness: There is no abdominal tenderness.  Musculoskeletal: Normal range of motion.     Comments: Walking with limp  Rises slowly from sitting to standing  Lymphadenopathy:     Cervical: No cervical adenopathy.  Skin:    General: Skin is warm and dry.  Neurological:     Mental Status: He is alert and oriented to person, place, and time.  Psychiatric:        Behavior: Behavior normal.        Thought Content: Thought content normal.        Judgment: Judgment normal.     BP (!) 161/99   Pulse 89   Temp 98.3 F (36.8 C) (Oral)   Ht _0  (1.778 m)   Wt 204 lb (92.5 kg)   BMI 29.27 kg/m   hgba1c 6.4       Assessment & Plan:  [name] comes in today with chief complaint of Medical Management of Chronic Issues   Diagnosis and orders addressed:  1. Essential hypertension, benign Low soium diet - CMP14+EGFR  2. Type 2 diabetes mellitus with other specified complication, without long-term current use of insulin (HCC) Continue to watch carbs in diet - Bayer DCA Hb A1c Waived  3. Hyperlipidemia with target LDL less than 100 Low fat diet - Lipid panel  4. Fibromyalgia Exercise when can to keep muscles warm- will help with pain  5. Chronic midline low back pain with sciatica, sciatica laterality unspecified Keep follow up with pain management  6. Vitamin D deficiency Continue daily vitamin d supplement  7. Smoker Smoking cessation encouraged  8. BMI 30.0-30.9,adult Discussed diet and exercise for person with BMI >25 Will recheck weight in 3-6 months  9. Prostate cancer screening - PSA, total and free   Labs pending Health Maintenance reviewed Diet and exercise encouraged  Follow up plan: 3 months   [name] Done, FNP

## 2019-04-14 NOTE — Patient Instructions (Signed)
Diabetes Mellitus and Foot Care  Foot care is an important part of your health, especially when you have diabetes. Diabetes may cause you to have problems because of poor blood flow (circulation) to your feet and legs, which can cause your skin to:   Become thinner and drier.   Break more easily.   Heal more slowly.   Peel and crack.  You may also have nerve damage (neuropathy) in your legs and feet, causing decreased feeling in them. This means that you may not notice minor injuries to your feet that could lead to more serious problems. Noticing and addressing any potential problems early is the best way to prevent future foot problems.  How to care for your feet  Foot hygiene   Wash your feet daily with warm water and mild soap. Do not use hot water. Then, pat your feet and the areas between your toes until they are completely dry. Do not soak your feet as this can dry your skin.   Trim your toenails straight across. Do not dig under them or around the cuticle. File the edges of your nails with an emery board or nail file.   Apply a moisturizing lotion or petroleum jelly to the skin on your feet and to dry, brittle toenails. Use lotion that does not contain alcohol and is unscented. Do not apply lotion between your toes.  Shoes and socks   Wear clean socks or stockings every day. Make sure they are not too tight. Do not wear knee-high stockings since they may decrease blood flow to your legs.   Wear shoes that fit properly and have enough cushioning. Always look in your shoes before you put them on to be sure there are no objects inside.   To break in new shoes, wear them for just a few hours a day. This prevents injuries on your feet.  Wounds, scrapes, corns, and calluses   Check your feet daily for blisters, cuts, bruises, sores, and redness. If you cannot see the bottom of your feet, use a mirror or ask someone for help.   Do not cut corns or calluses or try to remove them with medicine.   If you  find a minor scrape, cut, or break in the skin on your feet, keep it and the skin around it clean and dry. You may clean these areas with mild soap and water. Do not clean the area with peroxide, alcohol, or iodine.   If you have a wound, scrape, corn, or callus on your foot, look at it several times a day to make sure it is healing and not infected. Check for:  ? Redness, swelling, or pain.  ? Fluid or blood.  ? Warmth.  ? Pus or a bad smell.  General instructions   Do not cross your legs. This may decrease blood flow to your feet.   Do not use heating pads or hot water bottles on your feet. They may burn your skin. If you have lost feeling in your feet or legs, you may not know this is happening until it is too late.   Protect your feet from hot and cold by wearing shoes, such as at the beach or on hot pavement.   Schedule a complete foot exam at least once a year (annually) or more often if you have foot problems. If you have foot problems, report any cuts, sores, or bruises to your health care provider immediately.  Contact a health care provider if:     You have a medical condition that increases your risk of infection and you have any cuts, sores, or bruises on your feet.   You have an injury that is not healing.   You have redness on your legs or feet.   You feel burning or tingling in your legs or feet.   You have pain or cramps in your legs and feet.   Your legs or feet are numb.   Your feet always feel cold.   You have pain around a toenail.  Get help right away if:   You have a wound, scrape, corn, or callus on your foot and:  ? You have pain, swelling, or redness that gets worse.  ? You have fluid or blood coming from the wound, scrape, corn, or callus.  ? Your wound, scrape, corn, or callus feels warm to the touch.  ? You have pus or a bad smell coming from the wound, scrape, corn, or callus.  ? You have a fever.  ? You have a red line going up your leg.  Summary   Check your feet every day  for cuts, sores, red spots, swelling, and blisters.   Moisturize feet and legs daily.   Wear shoes that fit properly and have enough cushioning.   If you have foot problems, report any cuts, sores, or bruises to your health care provider immediately.   Schedule a complete foot exam at least once a year (annually) or more often if you have foot problems.  This information is not intended to replace advice given to you by your health care provider. Make sure you discuss any questions you have with your health care provider.  Document Released: 10/24/2000 Document Revised: 12/09/2017 Document Reviewed: 11/28/2016  Elsevier Interactive Patient Education  2019 [employer]

## 2019-04-15 LAB — CMP14+EGFR
ALT: 82 IU/L — ABNORMAL HIGH (ref 0–44)
AST: 107 IU/L — ABNORMAL HIGH (ref 0–40)
Albumin/Globulin Ratio: 1.8 (ref 1.2–2.2)
Albumin: 4.6 g/dL (ref 3.8–4.8)
Alkaline Phosphatase: 49 IU/L (ref 39–117)
BUN/Creatinine Ratio: 24 (ref 10–24)
BUN: 21 mg/dL (ref 8–27)
Bilirubin Total: 0.3 mg/dL (ref 0.0–1.2)
CO2: 17 mmol/L — ABNORMAL LOW (ref 20–29)
Calcium: 10.7 mg/dL — ABNORMAL HIGH (ref 8.6–10.2)
Chloride: 101 mmol/L (ref 96–106)
Creatinine, Ser: 0.88 mg/dL (ref 0.76–1.27)
GFR calc Af Amer: 106 mL/min/{1.73_m2} (ref >59)
GFR calc non Af Amer: 91 mL/min/{1.73_m2} (ref >59)
Globulin, Total: 2.5 g/dL (ref 1.5–4.5)
Glucose: 208 mg/dL — ABNORMAL HIGH (ref 65–99)
Potassium: 4.3 mmol/L (ref 3.5–5.2)
Sodium: 139 mmol/L (ref 134–144)
Total Protein: 7.1 g/dL (ref 6.0–8.5)

## 2019-04-15 LAB — LIPID PANEL
Chol/HDL Ratio: 5 ratio (ref 0.0–5.0)
Cholesterol, Total: 159 mg/dL (ref 100–199)
HDL: 32 mg/dL — ABNORMAL LOW (ref >39)
Triglycerides: 560 mg/dL (ref 0–149)

## 2019-04-15 LAB — PSA, TOTAL AND FREE
PSA, Free Pct: 9.7 %
PSA, Free: 0.33 ng/mL
Prostate Specific Ag, Serum: 3.4 ng/mL (ref 0.0–4.0)

## 2019-05-03 DIAGNOSIS — M25511 Pain in right shoulder: Secondary | ICD-10-CM | POA: Diagnosis not present

## 2019-05-03 DIAGNOSIS — M25512 Pain in left shoulder: Secondary | ICD-10-CM | POA: Diagnosis not present

## 2019-05-03 DIAGNOSIS — G894 Chronic pain syndrome: Secondary | ICD-10-CM | POA: Diagnosis not present

## 2019-05-03 DIAGNOSIS — M797 Fibromyalgia: Secondary | ICD-10-CM | POA: Diagnosis not present

## 2019-05-03 DIAGNOSIS — M79662 Pain in left lower leg: Secondary | ICD-10-CM | POA: Diagnosis not present

## 2019-05-03 DIAGNOSIS — M79661 Pain in right lower leg: Secondary | ICD-10-CM | POA: Diagnosis not present

## 2019-05-03 DIAGNOSIS — M545 Low back pain: Secondary | ICD-10-CM | POA: Diagnosis not present

## 2019-05-03 DIAGNOSIS — M542 Cervicalgia: Secondary | ICD-10-CM | POA: Diagnosis not present

## 2019-05-09 ENCOUNTER — Telehealth: Payer: Self-pay | Admitting: Nurse Practitioner

## 2019-05-09 MED ORDER — AMLODIPINE BESYLATE 5 MG PO TABS: 5.0000 mg | ORAL_TABLET | Freq: Every day | ORAL | 3 refills | Status: DC

## 2019-05-09 NOTE — Addendum Note (Signed)
Addended by: Chevis Pretty on: 05/09/2019 04:14 PM   Modules accepted: Orders

## 2019-05-09 NOTE — Telephone Encounter (Signed)
BP Readings  162/98 164/101 158/102 146/95 165/97 155/96 154/91 164/86 150/78 162/102 157/104 167/106 150/99 156/106

## 2019-05-09 NOTE — Telephone Encounter (Signed)
Blood pressure running high- added amlodpine 5mg  daily- prescription sent to pharmacy

## 2019-05-09 NOTE — Telephone Encounter (Signed)
Pt aware of provider feedback and voiced understanding. 

## 2019-05-12 ENCOUNTER — Telehealth: Payer: Self-pay | Admitting: Nurse Practitioner

## 2019-05-12 NOTE — Telephone Encounter (Signed)
Spoke with pharmacist and they do have the rx. Advised pharmacy to get ready because patient is going to pick up. Notified patient and he verbalized understanding

## 2019-05-23 ENCOUNTER — Telehealth: Payer: Self-pay | Admitting: Nurse Practitioner

## 2019-05-23 MED ORDER — AMLODIPINE BESYLATE 10 MG PO TABS: 10.0000 mg | ORAL_TABLET | Freq: Every day | ORAL | 3 refills | Status: DC

## 2019-05-23 NOTE — Addendum Note (Signed)
Addended by: Chevis Pretty on: 05/23/2019 02:38 PM   Modules accepted: Orders

## 2019-05-23 NOTE — Telephone Encounter (Signed)
Patient aware of med change

## 2019-05-23 NOTE — Telephone Encounter (Signed)
Increased norvasc to 10mg  daily- let me know if causes swelling in lower ext

## 2019-05-23 NOTE — Telephone Encounter (Signed)
Patient states that BP is still elevated.   BP Readings 150/91 155/97 155/96 141/95 161/93 156/96 152/95 164/105 155/94   Patient also states that his ear is still hurting right ear.  Patient has been taking flonase and decongestant with no reflief.

## 2019-05-24 NOTE — Telephone Encounter (Signed)
Is it still stopped up?

## 2019-05-24 NOTE — Telephone Encounter (Signed)
Patient states he has been using OTC decongestant and he has been using Flonase as well. Please advise

## 2019-05-24 NOTE — Telephone Encounter (Signed)
Patient states that his ear still feels stopped up but is now having pain in the right ear.

## 2019-05-24 NOTE — Telephone Encounter (Signed)
Is he using flonase nasal spray and OTC decongestant

## 2019-05-25 NOTE — Telephone Encounter (Signed)
Will need to be seen to see if has wax build up

## 2019-05-25 NOTE — Telephone Encounter (Signed)
appt made.  Patient aware  

## 2019-05-26 ENCOUNTER — Other Ambulatory Visit: Payer: Self-pay

## 2019-05-26 ENCOUNTER — Ambulatory Visit (INDEPENDENT_AMBULATORY_CARE_PROVIDER_SITE_OTHER): Payer: Medicare Other | Admitting: Nurse Practitioner

## 2019-05-26 ENCOUNTER — Encounter: Payer: Self-pay | Admitting: Nurse Practitioner

## 2019-05-26 VITALS — BP 127/82 | HR 74 | Temp 97.8°F | Ht 70.0 in | Wt 205.0 lb

## 2019-05-26 DIAGNOSIS — H65491 Other chronic nonsuppurative otitis media, right ear: Secondary | ICD-10-CM | POA: Diagnosis not present

## 2019-05-26 NOTE — Progress Notes (Signed)
   Subjective:    Patient ID: Michael Valenzuela, male    DOB: 06/23/1956, 63 y.o.   MRN: [id number]   Chief Complaint: Ear Pain   HPI Patient comes in today still c/o ear stopped up. He complained about this on 04/14/19 when he was in for follow up visit. He was told to take an OTC decongestant and use flonase nasal spray, he says that this has not helped. Has decreased hearing in right ear.   Review of Systems  Constitutional: Negative.  Negative for activity change and appetite change.  HENT: Positive for ear pain (right).   Eyes: Negative for pain.  Respiratory: Negative.  Negative for shortness of breath.   Cardiovascular: Negative.  Negative for chest pain, palpitations and leg swelling.  Gastrointestinal: Negative for abdominal pain.  Endocrine: Negative for polydipsia.  Genitourinary: Negative.   Skin: Negative for rash.  Neurological: Negative for dizziness, weakness and headaches.  Hematological: Does not bruise/bleed easily.  Psychiatric/Behavioral: Negative.   All other systems reviewed and are negative.      Objective:   Physical Exam Vitals signs and nursing note reviewed.  Constitutional:      Appearance: Normal appearance.  HENT:     Right Ear: There is no impacted cerumen. Tympanic membrane is bulging. Tympanic membrane is not injected or erythematous.     Left Ear: Hearing, tympanic membrane, ear canal and external ear normal.     Nose: Nose normal.     Mouth/Throat:     Mouth: Mucous membranes are moist.  Cardiovascular:     Rate and Rhythm: Normal rate and regular rhythm.  Pulmonary:     Breath sounds: Normal breath sounds.  Skin:    General: Skin is warm and dry.  Neurological:     General: No focal deficit present.     Mental Status: He is alert and oriented to person, place, and time.  Psychiatric:        Mood and Affect: Mood normal.        Behavior: Behavior normal.    BP 127/82   Pulse 74   Temp 97.8 F (36.6 C) (Oral)   Ht 5\' 10"  (1.778 m)    Wt 205 lb (93 kg)   BMI 29.41 kg/m         Assessment & Plan:  Michael Valenzuela in today with chief complaint of Ear Pain   1. Chronic otitis media of right ear with effusion continue flonase and decongestant Orders Placed This Encounter  Procedures  . Ambulatory referral to ENT    Referral Priority:   Routine    Referral Type:   Consultation    Referral Reason:   Specialty Services Required    Requested Specialty:   Otolaryngology    Number of Visits Requested:   [place], [place]

## 2019-05-31 DIAGNOSIS — M797 Fibromyalgia: Secondary | ICD-10-CM | POA: Diagnosis not present

## 2019-05-31 DIAGNOSIS — M25511 Pain in right shoulder: Secondary | ICD-10-CM | POA: Diagnosis not present

## 2019-05-31 DIAGNOSIS — M25512 Pain in left shoulder: Secondary | ICD-10-CM | POA: Diagnosis not present

## 2019-05-31 DIAGNOSIS — M545 Low back pain: Secondary | ICD-10-CM | POA: Diagnosis not present

## 2019-05-31 DIAGNOSIS — M542 Cervicalgia: Secondary | ICD-10-CM | POA: Diagnosis not present

## 2019-05-31 DIAGNOSIS — G894 Chronic pain syndrome: Secondary | ICD-10-CM | POA: Diagnosis not present

## 2019-05-31 DIAGNOSIS — M79662 Pain in left lower leg: Secondary | ICD-10-CM | POA: Diagnosis not present

## 2019-05-31 DIAGNOSIS — M79661 Pain in right lower leg: Secondary | ICD-10-CM | POA: Diagnosis not present

## 2019-06-30 ENCOUNTER — Ambulatory Visit (INDEPENDENT_AMBULATORY_CARE_PROVIDER_SITE_OTHER): Payer: Medicare Other | Admitting: Otolaryngology

## 2019-06-30 DIAGNOSIS — J310 Chronic rhinitis: Secondary | ICD-10-CM | POA: Diagnosis not present

## 2019-06-30 DIAGNOSIS — H900 Conductive hearing loss, bilateral: Secondary | ICD-10-CM

## 2019-06-30 DIAGNOSIS — H6983 Other specified disorders of Eustachian tube, bilateral: Secondary | ICD-10-CM

## 2019-06-30 DIAGNOSIS — H6521 Chronic serous otitis media, right ear: Secondary | ICD-10-CM | POA: Diagnosis not present

## 2019-06-30 DIAGNOSIS — J343 Hypertrophy of nasal turbinates: Secondary | ICD-10-CM | POA: Diagnosis not present

## 2019-07-01 ENCOUNTER — Other Ambulatory Visit: Payer: Self-pay | Admitting: Nurse Practitioner

## 2019-07-05 DIAGNOSIS — M25511 Pain in right shoulder: Secondary | ICD-10-CM | POA: Diagnosis not present

## 2019-07-05 DIAGNOSIS — M545 Low back pain: Secondary | ICD-10-CM | POA: Diagnosis not present

## 2019-07-05 DIAGNOSIS — M542 Cervicalgia: Secondary | ICD-10-CM | POA: Diagnosis not present

## 2019-07-05 DIAGNOSIS — M79662 Pain in left lower leg: Secondary | ICD-10-CM | POA: Diagnosis not present

## 2019-07-05 DIAGNOSIS — M25512 Pain in left shoulder: Secondary | ICD-10-CM | POA: Diagnosis not present

## 2019-07-05 DIAGNOSIS — G894 Chronic pain syndrome: Secondary | ICD-10-CM | POA: Diagnosis not present

## 2019-07-05 DIAGNOSIS — M79661 Pain in right lower leg: Secondary | ICD-10-CM | POA: Diagnosis not present

## 2019-07-05 DIAGNOSIS — M797 Fibromyalgia: Secondary | ICD-10-CM | POA: Diagnosis not present

## 2019-07-14 ENCOUNTER — Other Ambulatory Visit: Payer: Self-pay

## 2019-07-15 ENCOUNTER — Other Ambulatory Visit: Payer: Self-pay

## 2019-07-15 ENCOUNTER — Encounter: Payer: Self-pay | Admitting: Nurse Practitioner

## 2019-07-15 ENCOUNTER — Ambulatory Visit (INDEPENDENT_AMBULATORY_CARE_PROVIDER_SITE_OTHER): Payer: Medicare Other | Admitting: Nurse Practitioner

## 2019-07-15 VITALS — BP 123/64 | HR 40 | Temp 97.7°F | Ht 70.0 in | Wt 202.0 lb

## 2019-07-15 DIAGNOSIS — F17200 Nicotine dependence, unspecified, uncomplicated: Secondary | ICD-10-CM | POA: Diagnosis not present

## 2019-07-15 DIAGNOSIS — E559 Vitamin D deficiency, unspecified: Secondary | ICD-10-CM | POA: Diagnosis not present

## 2019-07-15 DIAGNOSIS — Z6830 Body mass index (BMI) 30.0-30.9, adult: Secondary | ICD-10-CM | POA: Diagnosis not present

## 2019-07-15 DIAGNOSIS — Z23 Encounter for immunization: Secondary | ICD-10-CM | POA: Diagnosis not present

## 2019-07-15 DIAGNOSIS — E1169 Type 2 diabetes mellitus with other specified complication: Secondary | ICD-10-CM

## 2019-07-15 DIAGNOSIS — R001 Bradycardia, unspecified: Secondary | ICD-10-CM

## 2019-07-15 DIAGNOSIS — M797 Fibromyalgia: Secondary | ICD-10-CM

## 2019-07-15 DIAGNOSIS — E785 Hyperlipidemia, unspecified: Secondary | ICD-10-CM

## 2019-07-15 DIAGNOSIS — I10 Essential (primary) hypertension: Secondary | ICD-10-CM

## 2019-07-15 DIAGNOSIS — M5440 Lumbago with sciatica, unspecified side: Secondary | ICD-10-CM | POA: Diagnosis not present

## 2019-07-15 DIAGNOSIS — G8929 Other chronic pain: Secondary | ICD-10-CM | POA: Diagnosis not present

## 2019-07-15 LAB — BAYER DCA HB A1C WAIVED: HB A1C (BAYER DCA - WAIVED): 6.3 % (ref <7.0)

## 2019-07-15 MED ORDER — LOSARTAN POTASSIUM 100 MG PO TABS: 100.0000 mg | ORAL_TABLET | Freq: Every day | ORAL | 1 refills | Status: DC

## 2019-07-15 MED ORDER — SITAGLIPTIN PHOSPHATE 100 MG PO TABS: 100.0000 mg | ORAL_TABLET | Freq: Every day | ORAL | 1 refills | Status: DC

## 2019-07-15 MED ORDER — FELODIPINE ER 10 MG PO TB24: 10.0000 mg | ORAL_TABLET | Freq: Every day | ORAL | 1 refills | Status: DC

## 2019-07-15 MED ORDER — HYDROCHLOROTHIAZIDE 25 MG PO TABS: 25.0000 mg | ORAL_TABLET | Freq: Every day | ORAL | 1 refills | Status: DC

## 2019-07-15 MED ORDER — FENOFIBRATE 160 MG PO TABS: 160.0000 mg | ORAL_TABLET | Freq: Every day | ORAL | 1 refills | Status: DC

## 2019-07-15 MED ORDER — AMLODIPINE BESYLATE 10 MG PO TABS: 10.0000 mg | ORAL_TABLET | Freq: Every day | ORAL | 1 refills | Status: DC

## 2019-07-15 MED ORDER — METFORMIN HCL 1000 MG PO TABS: 1000.0000 mg | ORAL_TABLET | Freq: Two times a day (BID) | ORAL | 1 refills | Status: DC

## 2019-07-15 NOTE — Patient Instructions (Signed)
Premature Ventricular Contraction  A premature ventricular contraction (PVC) is a common kind of irregular heartbeat (arrhythmia). These contractions are extra heartbeats that start in the ventricles of the heart and occur too early in the normal sequence. During the PVC, the heart's normal electrical pathway is not used, so the beat is shorter and less effective. In most cases, these contractions come and go and do not require treatment. What are the causes? Common causes of the condition include:  Smoking.  Drinking alcohol.  Certain medicines.  Some illegal drugs.  Stress.  Caffeine. Certain medical conditions can also cause PVCs:  Heart failure.  Heart attack, or coronary artery disease.  Heart valve problems.  Changes in minerals in the blood (electrolytes).  Low blood oxygen levels or high carbon dioxide levels. In many cases, the cause of this condition is not known. What are the signs or symptoms? The main symptom of this condition is fast or skipped heartbeats (palpitations). Other symptoms include:  Chest pain.  Shortness of breath.  Feeling tired.  Dizziness.  Difficulty exercising. In some cases, there are no symptoms. How is this diagnosed? This condition may be diagnosed based on:  Your medical history.  A physical exam. During the exam, the health care provider will check for irregular heartbeats.  Tests, such as: ? An ECG (electrocardiogram) to monitor the electrical activity of your heart. ? An ambulatory cardiac monitor. This device records your heartbeats for 24 hours or more. ? Stress tests to see how exercise affects your heart rhythm and blood supply. ? An echocardiogram. This test uses sound waves (ultrasound) to produce an image of your heart. ? An electrophysiology study (EPS). This test checks for electrical problems in your heart. How is this treated? Treatment for this condition depends on any underlying conditions, the type of PVCs  that you are having, and how much the symptoms are interfering with your daily life. Possible treatments include:  Avoiding things that cause premature contractions (triggers). These include caffeine and alcohol.  Taking medicines if symptoms are severe or if the extra heartbeats are frequent.  Getting treatment for underlying conditions that cause PVCs.  Having an implantable cardioverter defibrillator (ICD), if you are at risk for a serious arrhythmia. The ICD is a small device that is inserted into your chest to monitor your heartbeat. When it senses an irregular heartbeat, it sends a shock to bring the heartbeat back to normal.  Having a procedure to destroy the portion of the heart tissue that sends out abnormal signals (catheter ablation). In some cases, no treatment is required. Follow these instructions at home: Lifestyle  Do not use any products that contain nicotine or tobacco, such as cigarettes, e-cigarettes, and chewing tobacco. If you need help quitting, ask your health care provider.  Do not use illegal drugs.  Exercise regularly. Ask your health care provider what type of exercise is safe for you.  Try to get at least 7-9 hours of sleep each night, or as much as recommended by your health care provider.  Find healthy ways to manage stress. Avoid stressful situations when possible. Alcohol use  Do not drink alcohol if: ? Your health care provider tells you not to drink. ? You are pregnant, may be pregnant, or are planning to become pregnant. ? Alcohol triggers your episodes.  If you drink alcohol: ? Limit how much you use to:  0-1 drink a day for women.  0-2 drinks a day for men.  Be aware of how much   alcohol is in your drink. In the [place] one drink equals one 12 oz bottle of beer (355 mL), one 5 oz glass of wine (148 mL), or one 1 oz glass of hard liquor (44 mL). General instructions  Take over-the-counter and prescription medicines only as told by your  health care provider.  If caffeine triggers episodes of PVC, do not eat, drink, or use anything with caffeine in it.  Keep all follow-up visits as told by your health care provider. This is important. Contact a health care provider if you:  Feel palpitations. Get help right away if you:  Have chest pain.  Have shortness of breath.  Have sweating for no reason.  Have nausea and vomiting.  Become light-headed or you faint. Summary  A premature ventricular contraction (PVC) is a common kind of irregular heartbeat (arrhythmia).  In most cases, these contractions come and go and do not require treatment.  You may need to wear an ambulatory cardiac monitor. This records your heartbeats for 24 hours or more.  Treatment depends on any underlying conditions, the type of PVCs that you are having, and how much the symptoms are interfering with your daily life. This information is not intended to replace advice given to you by your health care provider. Make sure you discuss any questions you have with your health care provider. Document Released: 06/13/2004 Document Revised: 07/22/2018 Document Reviewed: 07/22/2018 Elsevier Patient Education  2020 [employer]  

## 2019-07-15 NOTE — Progress Notes (Signed)
Subjective:    Patient ID: Michael Valenzuela, male    DOB: Nov 24, 1955, 63 y.o.   MRN: [id number]   Chief Complaint: medical management of chronic issues   HPI:  1. Essential hypertension, benign No c/o chest pain, sob or headache. Does not check blood pressure at home. BP Readings from Last 3 Encounters:  05/26/19 127/82  04/14/19 (!) 161/99  01/24/19 138/87     2. Hyperlipidemia with target LDL less than 100 Does not watch diet and does no exercise. Lab Results  Component Value Date   CHOL 159 04/14/2019   HDL 32 (L) 04/14/2019   LDLCALC Comment 04/14/2019   TRIG 560 (HH) 04/14/2019   CHOLHDL 5.0 04/14/2019     3. Type 2 diabetes mellitus with other specified complication, without long-term current use of insulin (HCC) Does not check blood sugars everyday. Denies any symptoms of low blood sugars. Lab Results  Component Value Date   HGBA1C 6.4 04/14/2019     4. Chronic midline low back pain with sciatica, sciatica laterality unspecified He has chronic back pain that he sees pain clinic for. He says that his meds really do not help his pain.Marland KitchenHe has told pain management that he was not doing well. But they have not channged his meds  5. Fibromyalgia As stated previously, he sees pain management  6. Smoker Smokes over a pack a day  7. Vitamin D deficiency Takes daily vitamin d supplement when he remembers to take  8. BMI 30.0-30.9,adult No recent weight changes    Outpatient Encounter Medications as of 07/15/2019  Medication Sig  . amLODipine (NORVASC) 10 MG tablet Take 1 tablet (10 mg total) by mouth daily.  Marland Kitchen aspirin EC 81 MG tablet Take 1 tablet (81 mg total) by mouth daily.  . cyclobenzaprine (FLEXERIL) 10 MG tablet TAKE 1 TABLET BY MOUTH THREE TIMES DAILY AS NEEDED FOR MUSCLE SPASMS  . felodipine (PLENDIL) 10 MG 24 hr tablet Take 1 tablet (10 mg total) by mouth daily.  . fenofibrate 160 MG tablet Take 1 tablet (160 mg total) by mouth daily.  . fluticasone  (FLONASE) 50 MCG/ACT nasal spray Place 2 sprays into both nostrils daily.  Marland Kitchen glucose blood test strip TrueTrack strips. Test BS qd. Dx E11.9  . hydrochlorothiazide (HYDRODIURIL) 25 MG tablet Take 1 tablet (25 mg total) by mouth daily.  El[employer] Gu[employer] Devices (ACCU-CHEK SOFTCLIX) lancets Use as instructed  . losartan (COZAAR) 100 MG tablet Take 1 tablet (100 mg total) by mouth daily.  . metFORMIN (GLUCOPHAGE) 1000 MG tablet Take 1 tablet (1,000 mg total) by mouth 2 (two) times daily with a meal.  . oxyCODONE (ROXICODONE) 15 MG immediate release tablet TK 1 T PO Q 6 H  . rosuvastatin (CRESTOR) 10 MG tablet Take 1 tablet (10 mg total) by mouth daily.  . sitaGLIPtin (JANUVIA) 100 MG tablet Take 1 tablet (100 mg total) by mouth daily.     Past Surgical History:  Procedure Laterality Date  . HERNIA REPAIR      Family History  Problem Relation Age of Onset  . Heart disease Mother   . Diabetes Mother   . Cancer Mother   . Heart disease Father   . Diabetes Son 15       type 1  . Diabetes Paternal Uncle   . Heart attack Brother   . Hypertension Son     New complaints: None today  Social history: He is on disability  Controlled substance contract: n/a for our  office.    Review of Systems  Constitutional: Negative for activity change and appetite change.  HENT: Negative.   Eyes: Negative for pain.  Respiratory: Negative for shortness of breath.   Cardiovascular: Negative for chest pain, palpitations and leg swelling.  Gastrointestinal: Negative for abdominal pain.  Endocrine: Negative for polydipsia.  Genitourinary: Negative.   Musculoskeletal: Positive for back pain and myalgias.  Skin: Negative for rash.  Neurological: Negative for dizziness, weakness and headaches.  Hematological: Does not bruise/bleed easily.  Psychiatric/Behavioral: Negative.   All other systems reviewed and are negative.      Objective:   Physical Exam Vitals signs and nursing note reviewed.   Constitutional:      Appearance: Normal appearance. He is well-developed.  HENT:     Head: Normocephalic.     Nose: Nose normal.  Eyes:     Pupils: Pupils are equal, round, and reactive to light.  Neck:     Musculoskeletal: Normal range of motion and neck supple.     Thyroid: No thyroid mass or thyromegaly.     Vascular: No carotid bruit or JVD.     Trachea: Phonation normal.  Cardiovascular:     Rate and Rhythm: Normal rate. Rhythm irregular.     Heart sounds: Murmur (looudest at mitral valve 3/6) present.  Pulmonary:     Effort: Pulmonary effort is normal. No respiratory distress.     Breath sounds: Normal breath sounds.  Abdominal:     General: Bowel sounds are normal.     Palpations: Abdomen is soft.     Tenderness: There is no abdominal tenderness.  Musculoskeletal: Normal range of motion.  Lymphadenopathy:     Cervical: No cervical adenopathy.  Skin:    General: Skin is warm and dry.  Neurological:     Mental Status: He is alert and oriented to person, place, and time.  Psychiatric:        Behavior: Behavior normal.        Thought Content: Thought content normal.        Judgment: Judgment normal.    BP 123/64   Pulse (!) 40   Temp 97.7 F (36.5 C) (Oral)   Ht 5' 10"  (1.778 m)   Wt 202 lb (91.6 kg)   BMI 28.98 kg/m   EKG- PVC's every 4th beat. Heart rate of 77-Michael Hassell Done, FNP  hgba1c 6.3      Assessment & Plan:  Michael Valenzuela comes in today with chief complaint of Medical Management of Chronic Issues   Diagnosis and orders addressed:  1. Essential hypertension, benign Low sodium diet - CMP14+EGFR - hydrochlorothiazide (HYDRODIURIL) 25 MG tablet; Take 1 tablet (25 mg total) by mouth daily.  Dispense: 90 tablet; Refill: 1 - losartan (COZAAR) 100 MG tablet; Take 1 tablet (100 mg total) by mouth daily.  Dispense: 90 tablet; Refill: 1 - amLODipine (NORVASC) 10 MG tablet; Take 1 tablet (10 mg total) by mouth daily.  Dispense: 90 tablet; Refill: 1   2. Hyperlipidemia with target LDL less than 100 Low fat det - Lipid panel - fenofibrate 160 MG tablet; Take 1 tablet (160 mg total) by mouth daily.  Dispense: 90 tablet; Refill: 1  3. Type 2 diabetes mellitus with other specified complication, without long-term current use of insulin (HCC) continue to watch carbs in diet - Bayer DCA Hb A1c Waived - Microalbumin / creatinine urine ratio - sitaGLIPtin (JANUVIA) 100 MG tablet; Take 1 tablet (100 mg total) by mouth daily.  Dispense: 90 tablet;  Refill: 1 - metFORMIN (GLUCOPHAGE) 1000 MG tablet; Take 1 tablet (1,000 mg total) by mouth 2 (two) times daily with a meal.  Dispense: 180 tablet; Refill: 1  4. Chronic midline low back pain with sciatica, sciatica laterality unspecified Rest No heaby lifting Keep follow up with pain management  5. Fibromyalgia Again pain management  6. Smoker Smoking cessation incouraged  7. Vitamin D deficiency Continue daily vitamin d supplement  8. BMI 30.0-30.9,adult Discussed diet and exercise for person with BMI >25 Will recheck weight in 3-6 months  9. Bradycardia Will continue to watch - EKG 12-Lead   Labs pending Health Maintenance reviewed Diet and exercise encouraged  Follow up plan: 3 months   Sabana Eneas, FNP

## 2019-07-16 LAB — CMP14+EGFR
ALT: 58 IU/L — ABNORMAL HIGH (ref 0–44)
AST: 57 IU/L — ABNORMAL HIGH (ref 0–40)
Albumin/Globulin Ratio: 2.4 — ABNORMAL HIGH (ref 1.2–2.2)
Albumin: 4.8 g/dL (ref 3.8–4.8)
Alkaline Phosphatase: 50 IU/L (ref 39–117)
BUN/Creatinine Ratio: 23 (ref 10–24)
BUN: 19 mg/dL (ref 8–27)
Bilirubin Total: 0.3 mg/dL (ref 0.0–1.2)
CO2: 25 mmol/L (ref 20–29)
Calcium: 10.7 mg/dL — ABNORMAL HIGH (ref 8.6–10.2)
Chloride: 101 mmol/L (ref 96–106)
Creatinine, Ser: 0.82 mg/dL (ref 0.76–1.27)
GFR calc Af Amer: 108 mL/min/{1.73_m2} (ref >59)
GFR calc non Af Amer: 93 mL/min/{1.73_m2} (ref >59)
Globulin, Total: 2 g/dL (ref 1.5–4.5)
Glucose: 110 mg/dL — ABNORMAL HIGH (ref 65–99)
Potassium: 4.7 mmol/L (ref 3.5–5.2)
Sodium: 141 mmol/L (ref 134–144)
Total Protein: 6.8 g/dL (ref 6.0–8.5)

## 2019-07-16 LAB — MICROALBUMIN / CREATININE URINE RATIO
Creatinine, Urine: 136.3 mg/dL
Microalb/Creat Ratio: 4 mg/g creat (ref 0–29)
Microalbumin, Urine: 5.7 ug/mL

## 2019-07-16 LAB — LIPID PANEL
Chol/HDL Ratio: 3.8 ratio (ref 0.0–5.0)
Cholesterol, Total: 146 mg/dL (ref 100–199)
HDL: 38 mg/dL — ABNORMAL LOW (ref >39)
LDL Chol Calc (NIH): 54 mg/dL (ref 0–99)
Triglycerides: 356 mg/dL — ABNORMAL HIGH (ref 0–149)
VLDL Cholesterol Cal: 54 mg/dL — ABNORMAL HIGH (ref 5–40)

## 2019-08-04 DIAGNOSIS — M25511 Pain in right shoulder: Secondary | ICD-10-CM | POA: Diagnosis not present

## 2019-08-04 DIAGNOSIS — M79661 Pain in right lower leg: Secondary | ICD-10-CM | POA: Diagnosis not present

## 2019-08-04 DIAGNOSIS — M79662 Pain in left lower leg: Secondary | ICD-10-CM | POA: Diagnosis not present

## 2019-08-04 DIAGNOSIS — M542 Cervicalgia: Secondary | ICD-10-CM | POA: Diagnosis not present

## 2019-08-04 DIAGNOSIS — G894 Chronic pain syndrome: Secondary | ICD-10-CM | POA: Diagnosis not present

## 2019-08-04 DIAGNOSIS — M545 Low back pain: Secondary | ICD-10-CM | POA: Diagnosis not present

## 2019-08-04 DIAGNOSIS — M797 Fibromyalgia: Secondary | ICD-10-CM | POA: Diagnosis not present

## 2019-08-04 DIAGNOSIS — M25512 Pain in left shoulder: Secondary | ICD-10-CM | POA: Diagnosis not present

## 2019-08-23 ENCOUNTER — Other Ambulatory Visit: Payer: Self-pay | Admitting: Nurse Practitioner

## 2019-09-02 DIAGNOSIS — M79662 Pain in left lower leg: Secondary | ICD-10-CM | POA: Diagnosis not present

## 2019-09-02 DIAGNOSIS — M79661 Pain in right lower leg: Secondary | ICD-10-CM | POA: Diagnosis not present

## 2019-09-02 DIAGNOSIS — G894 Chronic pain syndrome: Secondary | ICD-10-CM | POA: Diagnosis not present

## 2019-09-02 DIAGNOSIS — M545 Low back pain: Secondary | ICD-10-CM | POA: Diagnosis not present

## 2019-09-02 DIAGNOSIS — M797 Fibromyalgia: Secondary | ICD-10-CM | POA: Diagnosis not present

## 2019-09-02 DIAGNOSIS — M542 Cervicalgia: Secondary | ICD-10-CM | POA: Diagnosis not present

## 2019-09-02 DIAGNOSIS — M25511 Pain in right shoulder: Secondary | ICD-10-CM | POA: Diagnosis not present

## 2019-09-02 DIAGNOSIS — M25512 Pain in left shoulder: Secondary | ICD-10-CM | POA: Diagnosis not present

## 2019-09-30 DIAGNOSIS — M25512 Pain in left shoulder: Secondary | ICD-10-CM | POA: Diagnosis not present

## 2019-09-30 DIAGNOSIS — M797 Fibromyalgia: Secondary | ICD-10-CM | POA: Diagnosis not present

## 2019-09-30 DIAGNOSIS — G894 Chronic pain syndrome: Secondary | ICD-10-CM | POA: Diagnosis not present

## 2019-09-30 DIAGNOSIS — M545 Low back pain: Secondary | ICD-10-CM | POA: Diagnosis not present

## 2019-09-30 DIAGNOSIS — M79662 Pain in left lower leg: Secondary | ICD-10-CM | POA: Diagnosis not present

## 2019-09-30 DIAGNOSIS — M25511 Pain in right shoulder: Secondary | ICD-10-CM | POA: Diagnosis not present

## 2019-09-30 DIAGNOSIS — M79661 Pain in right lower leg: Secondary | ICD-10-CM | POA: Diagnosis not present

## 2019-09-30 DIAGNOSIS — M542 Cervicalgia: Secondary | ICD-10-CM | POA: Diagnosis not present

## 2019-10-08 ENCOUNTER — Other Ambulatory Visit: Payer: Self-pay | Admitting: Family

## 2019-10-17 ENCOUNTER — Other Ambulatory Visit: Payer: Self-pay

## 2019-10-18 ENCOUNTER — Ambulatory Visit (INDEPENDENT_AMBULATORY_CARE_PROVIDER_SITE_OTHER): Payer: Medicare Other | Admitting: Nurse Practitioner

## 2019-10-18 ENCOUNTER — Other Ambulatory Visit: Payer: Self-pay

## 2019-10-18 ENCOUNTER — Encounter: Payer: Self-pay | Admitting: Nurse Practitioner

## 2019-10-18 VITALS — BP 128/75 | HR 75 | Temp 97.0°F | Resp 20 | Ht 70.0 in | Wt 199.0 lb

## 2019-10-18 DIAGNOSIS — F17200 Nicotine dependence, unspecified, uncomplicated: Secondary | ICD-10-CM

## 2019-10-18 DIAGNOSIS — E559 Vitamin D deficiency, unspecified: Secondary | ICD-10-CM | POA: Diagnosis not present

## 2019-10-18 DIAGNOSIS — E1169 Type 2 diabetes mellitus with other specified complication: Secondary | ICD-10-CM | POA: Diagnosis not present

## 2019-10-18 DIAGNOSIS — I10 Essential (primary) hypertension: Secondary | ICD-10-CM | POA: Diagnosis not present

## 2019-10-18 DIAGNOSIS — E785 Hyperlipidemia, unspecified: Secondary | ICD-10-CM

## 2019-10-18 LAB — BAYER DCA HB A1C WAIVED: HB A1C (BAYER DCA - WAIVED): 6.4 % (ref <7.0)

## 2019-10-18 NOTE — Progress Notes (Signed)
Subjective:    Patient ID: Michael Valenzuela, male    DOB: 05/29/1956, 63 y.o.   MRN: [id number]   Chief Complaint: medical manage,ennt of chronic issues   HPI:  1. Essential hypertension, benign No c/o chest pain, sob or headache. Dos not check blood pressure at home. BP Readings from Last 3 Encounters:  07/15/19 123/64  05/26/19 127/82  04/14/19 (!) 161/99     2. Hyperlipidemia with target LDL less than 100 Does not wtach diet and does no dedicated exercie. Lab Results  Component Value Date   CHOL 146 07/15/2019   HDL 38 (L) 07/15/2019   LDLCALC 54 07/15/2019   TRIG 356 (H) 07/15/2019   CHOLHDL 3.8 07/15/2019     3. Type 2 diabetes mellitus with other specified complication, without long-term current use of insulin (HCC) Blood sugars fasting have been running around 120-140.he denies any low blood sugars Lab Results  Component Value Date   HGBA1C 6.3 07/15/2019     4. Vitamin D deficiency Takes daily vitamin D supplement  5. Smoker Still smoking over a pack a day.    Outpatient Encounter Medications as of 10/18/2019  Medication Sig  . amLODipine (NORVASC) 10 MG tablet Take 1 tablet (10 mg total) by mouth daily.  Marland Kitchen aspirin EC 81 MG tablet Take 1 tablet (81 mg total) by mouth daily.  . cyclobenzaprine (FLEXERIL) 10 MG tablet TAKE 1 TABLET BY MOUTH THREE TIMES DAILY AS NEEDED FOR MUSCLE SPASMS  . felodipine (PLENDIL) 10 MG 24 hr tablet Take 1 tablet (10 mg total) by mouth daily.  . fenofibrate 160 MG tablet Take 1 tablet (160 mg total) by mouth daily.  . fluticasone (FLONASE) 50 MCG/ACT nasal spray Place 2 sprays into both nostrils daily.  Marland Kitchen glucose blood test strip TrueTrack strips. Test BS qd. Dx E11.9  . hydrochlorothiazide (HYDRODIURIL) 25 MG tablet Take 1 tablet (25 mg total) by mouth daily.  [employer] Devices (ACCU-CHEK SOFTCLIX) lancets Use as instructed  . losartan (COZAAR) 100 MG tablet Take 1 tablet (100 mg total) by mouth daily.  . metFORMIN (GLUCOPHAGE)  1000 MG tablet Take 1 tablet (1,000 mg total) by mouth 2 (two) times daily with a meal.  . oxyCODONE (ROXICODONE) 15 MG immediate release tablet TK 1 T PO Q 6 H  . rosuvastatin (CRESTOR) 10 MG tablet Take 1 tablet (10 mg total) by mouth daily.  . sitaGLIPtin (JANUVIA) 100 MG tablet Take 1 tablet (100 mg total) by mouth daily.    Past Surgical History:  Procedure Laterality Date  . HERNIA REPAIR      Family History  Problem Relation Age of Onset  . Heart disease Mother   . Diabetes Mother   . Cancer Mother   . Heart disease Father   . Diabetes Son 15       type 1  . Diabetes Paternal Uncle   . Heart attack Brother   . Hypertension Son     New complaints: None today  Social history: Lives with wife- is on disability for his back  Controlled substance contract: n/a  * gets his pain meds from pain management  Review of Systems  Constitutional: Negative for activity change and appetite change.  HENT: Negative.   Eyes: Negative for pain.  Respiratory: Negative for shortness of breath.   Cardiovascular: Negative for chest pain, palpitations and leg swelling.  Gastrointestinal: Negative for abdominal pain.  Endocrine: Negative for polydipsia.  Genitourinary: Negative.   Skin: Negative for rash.  Neurological: Negative for dizziness, weakness and headaches.  Hematological: Does not bruise/bleed easily.  Psychiatric/Behavioral: Negative.   All other systems reviewed and are negative.      Objective:   Physical Exam Vitals signs and nursing note reviewed.  Constitutional:      Appearance: Normal appearance. He is well-developed.  HENT:     Head: Normocephalic.     Nose: Nose normal.  Eyes:     Pupils: Pupils are equal, round, and reactive to light.  Neck:     Musculoskeletal: Normal range of motion and neck supple.     Thyroid: No thyroid mass or thyromegaly.     Vascular: No carotid bruit or JVD.     Trachea: Phonation normal.  Cardiovascular:     Rate and  Rhythm: Normal rate and regular rhythm.     Heart sounds: Murmur (2/6 heard loudest at apex) present.  Pulmonary:     Effort: Pulmonary effort is normal. No respiratory distress.     Breath sounds: Normal breath sounds.  Abdominal:     General: Bowel sounds are normal.     Palpations: Abdomen is soft.     Tenderness: There is no abdominal tenderness.  Musculoskeletal: Normal range of motion.  Lymphadenopathy:     Cervical: No cervical adenopathy.  Skin:    General: Skin is warm and dry.  Neurological:     Mental Status: He is alert and oriented to person, place, and time.  Psychiatric:        Behavior: Behavior normal.        Thought Content: Thought content normal.        Judgment: Judgment normal.    BP 128/75   Pulse 75   Temp (!) 97 F (36.1 C) (Temporal)   Resp 20   Ht 5' 10"  (1.778 m)   Wt 199 lb (90.3 kg)   SpO2 97%   BMI 28.55 kg/m   hgba1c 6.4     Assessment & Plan:  Michael Valenzuela comes in today with chief complaint of Medical Management of Chronic Issues   Diagnosis and orders addressed:  1. Essential hypertension, benign Low sodium diet - CMP14+EGFR  2. Hyperlipidemia with target LDL less than 100 Low fat diet - Lipid panel  3. Type 2 diabetes mellitus with other specified complication, without long-term current use of insulin (HCC) Continue to wtach carbs in diet - hgba1c  4. Vitamin D deficiency Continue daily vitamin d   5. Smoker Smoking cessation encourgaed   Labs pending Health Maintenance reviewed Diet and exercise encouraged  Follow up plan: 3 months   [name] Done, FNP

## 2019-10-18 NOTE — Patient Instructions (Signed)

## 2019-10-19 LAB — CMP14+EGFR
ALT: 45 IU/L — ABNORMAL HIGH (ref 0–44)
AST: 48 IU/L — ABNORMAL HIGH (ref 0–40)
Albumin/Globulin Ratio: 2.5 — ABNORMAL HIGH (ref 1.2–2.2)
Albumin: 5 g/dL — ABNORMAL HIGH (ref 3.8–4.8)
Alkaline Phosphatase: 60 IU/L (ref 39–117)
BUN/Creatinine Ratio: 16 (ref 10–24)
BUN: 14 mg/dL (ref 8–27)
Bilirubin Total: 0.4 mg/dL (ref 0.0–1.2)
CO2: 25 mmol/L (ref 20–29)
Calcium: 10.3 mg/dL — ABNORMAL HIGH (ref 8.6–10.2)
Chloride: 99 mmol/L (ref 96–106)
Creatinine, Ser: 0.86 mg/dL (ref 0.76–1.27)
GFR calc Af Amer: 106 mL/min/{1.73_m2} (ref >59)
GFR calc non Af Amer: 92 mL/min/{1.73_m2} (ref >59)
Globulin, Total: 2 g/dL (ref 1.5–4.5)
Glucose: 134 mg/dL — ABNORMAL HIGH (ref 65–99)
Potassium: 4.6 mmol/L (ref 3.5–5.2)
Sodium: 138 mmol/L (ref 134–144)
Total Protein: 7 g/dL (ref 6.0–8.5)

## 2019-10-19 LAB — LIPID PANEL
Chol/HDL Ratio: 3.5 ratio (ref 0.0–5.0)
Cholesterol, Total: 140 mg/dL (ref 100–199)
HDL: 40 mg/dL
LDL Chol Calc (NIH): 56 mg/dL (ref 0–99)
Triglycerides: 280 mg/dL — ABNORMAL HIGH (ref 0–149)
VLDL Cholesterol Cal: 44 mg/dL — ABNORMAL HIGH (ref 5–40)

## 2019-11-13 ENCOUNTER — Other Ambulatory Visit: Payer: Self-pay | Admitting: Nurse Practitioner

## 2019-11-13 DIAGNOSIS — E785 Hyperlipidemia, unspecified: Secondary | ICD-10-CM

## 2019-11-29 ENCOUNTER — Other Ambulatory Visit: Payer: Self-pay | Admitting: Nurse Practitioner

## 2019-11-29 DIAGNOSIS — M797 Fibromyalgia: Secondary | ICD-10-CM | POA: Diagnosis not present

## 2019-11-29 DIAGNOSIS — G894 Chronic pain syndrome: Secondary | ICD-10-CM | POA: Diagnosis not present

## 2019-11-29 DIAGNOSIS — M542 Cervicalgia: Secondary | ICD-10-CM | POA: Diagnosis not present

## 2019-11-29 DIAGNOSIS — M545 Low back pain: Secondary | ICD-10-CM | POA: Diagnosis not present

## 2019-11-29 DIAGNOSIS — M79661 Pain in right lower leg: Secondary | ICD-10-CM | POA: Diagnosis not present

## 2019-11-29 DIAGNOSIS — M79662 Pain in left lower leg: Secondary | ICD-10-CM | POA: Diagnosis not present

## 2019-11-29 DIAGNOSIS — M25512 Pain in left shoulder: Secondary | ICD-10-CM | POA: Diagnosis not present

## 2019-11-29 DIAGNOSIS — M25511 Pain in right shoulder: Secondary | ICD-10-CM | POA: Diagnosis not present

## 2019-12-02 DIAGNOSIS — H5213 Myopia, bilateral: Secondary | ICD-10-CM | POA: Diagnosis not present

## 2019-12-02 DIAGNOSIS — H25093 Other age-related incipient cataract, bilateral: Secondary | ICD-10-CM | POA: Diagnosis not present

## 2019-12-02 DIAGNOSIS — H538 Other visual disturbances: Secondary | ICD-10-CM | POA: Diagnosis not present

## 2019-12-02 DIAGNOSIS — H524 Presbyopia: Secondary | ICD-10-CM | POA: Diagnosis not present

## 2019-12-02 DIAGNOSIS — E119 Type 2 diabetes mellitus without complications: Secondary | ICD-10-CM | POA: Diagnosis not present

## 2019-12-02 LAB — HM DIABETES EYE EXAM

## 2019-12-15 ENCOUNTER — Ambulatory Visit (INDEPENDENT_AMBULATORY_CARE_PROVIDER_SITE_OTHER): Payer: Medicare Other | Admitting: Nurse Practitioner

## 2019-12-15 ENCOUNTER — Encounter: Payer: Self-pay | Admitting: Nurse Practitioner

## 2019-12-15 DIAGNOSIS — E785 Hyperlipidemia, unspecified: Secondary | ICD-10-CM | POA: Diagnosis not present

## 2019-12-15 DIAGNOSIS — Z6830 Body mass index (BMI) 30.0-30.9, adult: Secondary | ICD-10-CM

## 2019-12-15 DIAGNOSIS — M797 Fibromyalgia: Secondary | ICD-10-CM

## 2019-12-15 DIAGNOSIS — I10 Essential (primary) hypertension: Secondary | ICD-10-CM

## 2019-12-15 DIAGNOSIS — E1169 Type 2 diabetes mellitus with other specified complication: Secondary | ICD-10-CM | POA: Diagnosis not present

## 2019-12-15 DIAGNOSIS — E559 Vitamin D deficiency, unspecified: Secondary | ICD-10-CM | POA: Diagnosis not present

## 2019-12-15 DIAGNOSIS — F17210 Nicotine dependence, cigarettes, uncomplicated: Secondary | ICD-10-CM | POA: Diagnosis not present

## 2019-12-15 DIAGNOSIS — Z87891 Personal history of nicotine dependence: Secondary | ICD-10-CM | POA: Insufficient documentation

## 2019-12-15 MED ORDER — LOSARTAN POTASSIUM 100 MG PO TABS: 100.0000 mg | ORAL_TABLET | Freq: Every day | ORAL | 1 refills | Status: DC

## 2019-12-15 MED ORDER — FENOFIBRATE 160 MG PO TABS: 160.0000 mg | ORAL_TABLET | Freq: Every day | ORAL | 1 refills | Status: DC

## 2019-12-15 MED ORDER — HYDROCHLOROTHIAZIDE 25 MG PO TABS: 25.0000 mg | ORAL_TABLET | Freq: Every day | ORAL | 1 refills | Status: DC

## 2019-12-15 MED ORDER — METFORMIN HCL 1000 MG PO TABS: 1000.0000 mg | ORAL_TABLET | Freq: Two times a day (BID) | ORAL | 1 refills | Status: DC

## 2019-12-15 MED ORDER — SITAGLIPTIN PHOSPHATE 100 MG PO TABS: 100.0000 mg | ORAL_TABLET | Freq: Every day | ORAL | 1 refills | Status: DC

## 2019-12-15 MED ORDER — FELODIPINE ER 10 MG PO TB24: 10.0000 mg | ORAL_TABLET | Freq: Every day | ORAL | 1 refills | Status: DC

## 2019-12-15 MED ORDER — AMLODIPINE BESYLATE 10 MG PO TABS: 10.0000 mg | ORAL_TABLET | Freq: Every day | ORAL | 1 refills | Status: DC

## 2019-12-15 MED ORDER — ROSUVASTATIN CALCIUM 10 MG PO TABS: ORAL_TABLET | ORAL | 1 refills | Status: DC

## 2019-12-15 NOTE — Progress Notes (Signed)
Virtual Visit via telephone Note Due to COVID-19 pandemic this visit was conducted virtually. This visit type was conducted due to national recommendations for restrictions regarding the COVID-19 Pandemic (e.g. social distancing, sheltering in place) in an effort to limit this patient's exposure and mitigate transmission in our community. All issues noted in this document were discussed and addressed.  A physical exam was not performed with this format.  I connected with Michael Valenzuela on 12/15/19 at 2:40 by telephone and verified that I am speaking with the correct person using two identifiers. Michael Valenzuela is currently located at home and  No one is currently with  him during visit. The provider, [name] Done, FNP is located in their office at time of visit.  I discussed the limitations, risks, security and privacy concerns of performing an evaluation and management service by telephone and the availability of in person appointments. I also discussed with the patient that there may be a patient responsible charge related to this service. The patient expressed understanding and agreed to proceed.   History and Present Illness:   Chief Complaint: Medical Management of Chronic Issues    HPI:  1. Essential hypertension, benign No c/o chest pain, sob or headache. Does  check blood pressure at home with systolic running around [id number]. BP Readings from Last 3 Encounters:  10/18/19 128/75  07/15/19 123/64  05/26/19 127/82     2. Hyperlipidemia with target LDL less than 100 Does not watch diet and does very little exercise due to chronic pain. Lab Results  Component Value Date   CHOL 140 10/18/2019   HDL 40 10/18/2019   LDLCALC 56 10/18/2019   TRIG 280 (H) 10/18/2019   CHOLHDL 3.5 10/18/2019     3. Type 2 diabetes mellitus with other specified complication, without long-term current use of insulin (HCC) Fasting blood sugars are running around 110-130. He denies any low blood  sugar readings. He does nt watch diet very closely. Lab Results  Component Value Date   HGBA1C 6.4 10/18/2019     4. Fibromyalgia Has constant pain. Sees pain management monthly.  5. Vitamin D deficiency Takes a daily vitamin d supplement  6. BMI 30.0-30.9,adult No recent weight changes. Wt Readings from Last 3 Encounters:  10/18/19 199 lb (90.3 kg)  07/15/19 202 lb (91.6 kg)  05/26/19 205 lb (93 kg)   BMI Readings from Last 3 Encounters:  10/18/19 28.55 kg/m  07/15/19 28.98 kg/m  05/26/19 29.41 kg/m       Outpatient Encounter Medications as of 12/15/2019  Medication Sig  . amLODipine (NORVASC) 10 MG tablet Take 1 tablet (10 mg total) by mouth daily.  Marland Kitchen aspirin EC 81 MG tablet Take 1 tablet (81 mg total) by mouth daily.  . cyclobenzaprine (FLEXERIL) 10 MG tablet TAKE 1 TABLET BY MOUTH THREE TIMES DAILY AS NEEDED FOR MUSCLE SPASMS  . felodipine (PLENDIL) 10 MG 24 hr tablet Take 1 tablet (10 mg total) by mouth daily.  . fenofibrate 160 MG tablet Take 1 tablet (160 mg total) by mouth daily.  . fluticasone (FLONASE) 50 MCG/ACT nasal spray Place 2 sprays into both nostrils daily.  Marland Kitchen glucose blood test strip TrueTrack strips. Test BS qd. Dx E11.9  . hydrochlorothiazide (HYDRODIURIL) 25 MG tablet Take 1 tablet (25 mg total) by mouth daily.  Elmore Guise Devices (ACCU-CHEK SOFTCLIX) lancets Use as instructed  . losartan (COZAAR) 100 MG tablet Take 1 tablet (100 mg total) by mouth daily.  . metFORMIN (GLUCOPHAGE) 1000 MG tablet  Take 1 tablet (1,000 mg total) by mouth 2 (two) times daily with a meal.  . oxyCODONE (ROXICODONE) 15 MG immediate release tablet TK 1 T PO Q 6 H  . rosuvastatin (CRESTOR) 10 MG tablet TAKE 1 TABLET(10 MG) BY MOUTH DAILY  . sitaGLIPtin (JANUVIA) 100 MG tablet Take 1 tablet (100 mg total) by mouth daily.     Past Surgical History:  Procedure Laterality Date  . HERNIA REPAIR      Family History  Problem Relation Age of Onset  . Heart disease Mother     . Diabetes Mother   . Cancer Mother   . Heart disease Father   . Diabetes Son 15       type 1  . Diabetes Paternal Uncle   . Heart attack Brother   . Hypertension Son     New complaints: None today  Social history: Lives with wife- is on disability for back pain  Controlled substance contract: n/a    Review of Systems  Constitutional: Negative for diaphoresis and weight loss.  Eyes: Negative for blurred vision, double vision and pain.  Respiratory: Negative for shortness of breath.   Cardiovascular: Negative for chest pain, palpitations, orthopnea and leg swelling.  Gastrointestinal: Negative for abdominal pain.  Musculoskeletal: Positive for back pain and myalgias.  Skin: Negative for rash.  Neurological: Negative for dizziness, sensory change, loss of consciousness, weakness and headaches.  Endo/Heme/Allergies: Negative for polydipsia. Does not bruise/bleed easily.  Psychiatric/Behavioral: Negative for memory loss. The patient does not have insomnia.   All other systems reviewed and are negative.    Observations/Objective: Alert and oriented- answers all questions appropriately No distress   Assessment and Plan: Michael Valenzuela comes in today with chief complaint of Medical Management of Chronic Issues   Diagnosis and orders addressed:  1. Essential hypertension, benign Low sodium diet Keep diary of blood pressure - hydrochlorothiazide (HYDRODIURIL) 25 MG tablet; Take 1 tablet (25 mg total) by mouth daily.  Dispense: 90 tablet; Refill: 1 - losartan (COZAAR) 100 MG tablet; Take 1 tablet (100 mg total) by mouth daily.  Dispense: 90 tablet; Refill: 1 - amLODipine (NORVASC) 10 MG tablet; Take 1 tablet (10 mg total) by mouth daily.  Dispense: 90 tablet; Refill: 1  2. Hyperlipidemia with target LDL less than 100 Low fat diet - rosuvastatin (CRESTOR) 10 MG tablet; TAKE 1 TABLET(10 MG) BY MOUTH DAILY  Dispense: 90 tablet; Refill: 1 - fenofibrate 160 MG tablet; Take 1  tablet (160 mg total) by mouth daily.  Dispense: 90 tablet; Refill: 1  3. Type 2 diabetes mellitus with other specified complication, without long-term current use of insulin (HCC) Continue to wtach carbs in diet - sitaGLIPtin (JANUVIA) 100 MG tablet; Take 1 tablet (100 mg total) by mouth daily.  Dispense: 90 tablet; Refill: 1 - metFORMIN (GLUCOPHAGE) 1000 MG tablet; Take 1 tablet (1,000 mg total) by mouth 2 (two) times daily with a meal.  Dispense: 180 tablet; Refill: 1  4. Fibromyalgia Keep follow up with pain management  5. Vitamin D deficiency Continue daily vitamin d supplement  6. BMI 30.0-30.9,adult Discussed diet and exercise for person with BMI >25 Will recheck weight in 3-6 months    Labs pending Health Maintenance reviewed Diet and exercise encouraged  Follow up plan: 3 months     I discussed the assessment and treatment plan with the patient. The patient was provided an opportunity to ask questions and all were answered. The patient agreed with the plan and demonstrated  an understanding of the instructions.   The patient was advised to call back or seek an in-person evaluation if the symptoms worsen or if the condition fails to improve as anticipated.  The above assessment and management plan was discussed with the patient. The patient verbalized understanding of and has agreed to the management plan. Patient is aware to call the clinic if symptoms persist or worsen. Patient is aware when to return to the clinic for a follow-up visit. Patient educated on when it is appropriate to go to the emergency department.   Time call ended:  2:56  I provided 16 minutes of non-face-to-face time during this encounter.    [name] Done, FNP

## 2019-12-28 DIAGNOSIS — M25512 Pain in left shoulder: Secondary | ICD-10-CM | POA: Diagnosis not present

## 2019-12-28 DIAGNOSIS — M797 Fibromyalgia: Secondary | ICD-10-CM | POA: Diagnosis not present

## 2019-12-28 DIAGNOSIS — G894 Chronic pain syndrome: Secondary | ICD-10-CM | POA: Diagnosis not present

## 2019-12-28 DIAGNOSIS — M25511 Pain in right shoulder: Secondary | ICD-10-CM | POA: Diagnosis not present

## 2019-12-28 DIAGNOSIS — M545 Low back pain: Secondary | ICD-10-CM | POA: Diagnosis not present

## 2019-12-28 DIAGNOSIS — M542 Cervicalgia: Secondary | ICD-10-CM | POA: Diagnosis not present

## 2019-12-28 DIAGNOSIS — M79662 Pain in left lower leg: Secondary | ICD-10-CM | POA: Diagnosis not present

## 2019-12-28 DIAGNOSIS — M79661 Pain in right lower leg: Secondary | ICD-10-CM | POA: Diagnosis not present

## 2020-01-05 ENCOUNTER — Other Ambulatory Visit: Payer: Self-pay | Admitting: Nurse Practitioner

## 2020-01-20 DIAGNOSIS — Z20828 Contact with and (suspected) exposure to other viral communicable diseases: Secondary | ICD-10-CM | POA: Diagnosis not present

## 2020-01-25 DIAGNOSIS — M25511 Pain in right shoulder: Secondary | ICD-10-CM | POA: Diagnosis not present

## 2020-01-25 DIAGNOSIS — M542 Cervicalgia: Secondary | ICD-10-CM | POA: Diagnosis not present

## 2020-01-25 DIAGNOSIS — M545 Low back pain: Secondary | ICD-10-CM | POA: Diagnosis not present

## 2020-01-25 DIAGNOSIS — G894 Chronic pain syndrome: Secondary | ICD-10-CM | POA: Diagnosis not present

## 2020-01-25 DIAGNOSIS — M79662 Pain in left lower leg: Secondary | ICD-10-CM | POA: Diagnosis not present

## 2020-01-25 DIAGNOSIS — M25512 Pain in left shoulder: Secondary | ICD-10-CM | POA: Diagnosis not present

## 2020-01-25 DIAGNOSIS — M79661 Pain in right lower leg: Secondary | ICD-10-CM | POA: Diagnosis not present

## 2020-01-25 DIAGNOSIS — M797 Fibromyalgia: Secondary | ICD-10-CM | POA: Diagnosis not present

## 2020-02-09 ENCOUNTER — Ambulatory Visit (INDEPENDENT_AMBULATORY_CARE_PROVIDER_SITE_OTHER): Payer: Medicare Other | Admitting: *Deleted

## 2020-02-09 VITALS — Ht 70.0 in | Wt 199.1 lb

## 2020-02-09 DIAGNOSIS — Z0000 Encounter for general adult medical examination without abnormal findings: Secondary | ICD-10-CM | POA: Diagnosis not present

## 2020-02-09 NOTE — Progress Notes (Addendum)
MEDICARE ANNUAL WELLNESS VISIT  02/09/2020  Telephone Visit Disclaimer This Medicare AWV was conducted by telephone due to national recommendations for restrictions regarding the COVID-19 Pandemic (e.g. social distancing).  I verified, using two identifiers, that I am speaking with [name] or their authorized healthcare agent. I discussed the limitations, risks, security, and privacy concerns of performing an evaluation and Valenzuela service by telephone and the potential availability of an in-person appointment in the future. The patient expressed understanding and agreed to proceed.   Subjective:  Michael Valenzuela is a 64 y.o. male patient of Michael Valenzuela, [place] who had a Medicare Annual Wellness Visit today via telephone. Michael Valenzuela is Retired and Disabled and lives with their spouse. he has 2 children. he reports that he is socially active and does interact with friends/family regularly. he is not physically active and enjoys playing golf.  Patient Care Team: Michael Pretty, FNP as PCP - [facility] (Nurse Practitioner) Center, Michael Valenzuela (Pain Medicine)  Advanced Directives 02/09/2020 01/24/2019 12/31/2017 09/11/2014  Does Patient Have a Medical Advance Directive? No No No No  Would patient like information on creating a medical advance directive? Yes (MAU/Ambulatory/Procedural Areas - Information given) Yes (MAU/Ambulatory/Procedural Areas - Information given) No - Patient declined Yes - Educational materials given    Hospital Utilization Over the Past 12 Months: # of hospitalizations or ER visits: 0 # of surgeries: 0  Review of Systems    Patient reports that his overall health is worse compared to last year.  History obtained from chart review and the patient General ROS: negative  Patient Reported Readings (BP, Pulse, CBG, Weight, etc) CBG 116 BP 150/90  Pain Assessment Pain : 0-10 Pain Score: 6  Pain Type: Chronic pain Pain Location: Back Pain  Orientation: Right Pain Radiating Towards: Neck, Right elbow and hand Pain Descriptors / Indicators: Constant Pain Onset: More than a month ago Pain Frequency: Constant Pain Relieving Factors: Pain medication and rest  Pain Relieving Factors: Pain medication and rest  Current Medications & Allergies (verified) Allergies as of 02/09/2020   No Known Allergies     Medication List       Accurate as of February 09, 2020  8:51 AM. If you have any questions, ask your nurse or doctor.        accu-chek softclix lancets Use as instructed   amLODipine 10 MG tablet Commonly known as: NORVASC Take 1 tablet (10 mg total) by mouth daily.   aspirin EC 81 MG tablet Take 1 tablet (81 mg total) by mouth daily.   cyclobenzaprine 10 MG tablet Commonly known as: FLEXERIL TAKE 1 TABLET BY MOUTH THREE TIMES DAILY AS NEEDED FOR MUSCLE SPASMS   felodipine 10 MG 24 hr tablet Commonly known as: PLENDIL Take 1 tablet (10 mg total) by mouth daily.   fenofibrate 160 MG tablet Take 1 tablet (160 mg total) by mouth daily.   fluticasone 50 MCG/ACT nasal spray Commonly known as: FLONASE Place 2 sprays into both nostrils daily.   glucose blood test strip TrueTrack strips. Test BS qd. Dx E11.9   hydrochlorothiazide 25 MG tablet Commonly known as: HYDRODIURIL Take 1 tablet (25 mg total) by mouth daily.   losartan 100 MG tablet Commonly known as: COZAAR Take 1 tablet (100 mg total) by mouth daily.   metFORMIN 1000 MG tablet Commonly known as: GLUCOPHAGE Take 1 tablet (1,000 mg total) by mouth 2 (two) times daily with a meal.   oxyCODONE 15 MG immediate release tablet Commonly  known as: ROXICODONE TK 1 T PO Q 6 H   rosuvastatin 10 MG tablet Commonly known as: CRESTOR TAKE 1 TABLET(10 MG) BY MOUTH DAILY   sitaGLIPtin 100 MG tablet Commonly known as: Januvia Take 1 tablet (100 mg total) by mouth daily.       History (reviewed): Past Medical History:  Diagnosis Date  . Chronic low back  pain   . Diabetes mellitus without complication ([place])   . Fibromyalgia   . Hyperlipidemia   . Hypertension   . Vitamin D deficiency    Past Surgical History:  Procedure Laterality Date  . HERNIA REPAIR     Family History  Problem Relation Age of Onset  . Heart disease Mother   . Diabetes Mother   . Cancer Mother   . Heart disease Father   . Diabetes Son 15       type 1  . Diabetes Paternal Uncle   . Heart attack Brother   . Hypertension Son    Social History   Socioeconomic History  . Marital status: Married    Spouse name: Michael Valenzuela  . Number of children: 2  . Years of education: 49  . Highest education level: Some college, no degree  Occupational History  . Occupation: Retired    Fish farm manager: [employer]  . Occupation: disabled  Tobacco Use  . Smoking status: Current Every Day Smoker    Packs/day: 0.50    Types: Cigarettes  . Smokeless tobacco: Never Used  Substance and Sexual Activity  . Alcohol use: Yes    Comment: occasional  . Drug use: No  . Sexual activity: Not Currently  Other Topics Concern  . Not on file  Social History Narrative   Patient has fibromyalgia and has significant pain with that. He has two adult children and is married. He has one grandchild.    Social Determinants of Health   Financial Resource Strain: Low Risk   . Difficulty of Paying Living Expenses: Not hard at all  Food Insecurity: No Food Insecurity  . Worried About Charity fundraiser in the Last Year: Never true  . Ran Out of Food in the Last Year: Never true  Transportation Needs: No Transportation Needs  . Lack of Transportation (Medical): No  . Lack of Transportation (Non-Medical): No  Physical Activity: Inactive  . Days of Exercise per Week: 0 days  . Minutes of Exercise per Session: 0 min  Stress: No Stress Concern Present  . Feeling of Stress : Not at all  Social Connections: Somewhat Isolated  . Frequency of Communication with Friends and Family: More than three  times a week  . Frequency of Social Gatherings with Friends and Family: More than three times a week  . Attends Religious Services: Never  . Active Member of Clubs or Organizations: No  . Attends Archivist Meetings: Never  . Marital Status: Married    Activities of Daily Living In your present state of health, do you have any difficulty performing the following activities: 02/09/2020  Hearing? N  Vision? N  Difficulty concentrating or making decisions? N  Walking or climbing stairs? Y  Dressing or bathing? N  Doing errands, shopping? N  Preparing Food and eating ? N  Using the Toilet? N  In the past six months, have you accidently leaked urine? N  Do you have problems with loss of bowel control? N  Managing your Medications? N  Managing your Finances? N  Housekeeping or managing your Housekeeping?  N  Some recent data might be hidden    Patient Education/ Literacy How often do you need to have someone help you when you read instructions, pamphlets, or other written materials from your doctor or pharmacy?: 1 - Never What is the last grade level you completed in school?: Some College  Exercise Current Exercise Habits: The patient does not participate in regular exercise at present, Exercise limited by: neurologic condition(s)  Diet Patient reports consuming 2 meals a day and 0 snack(s) a day Patient reports that his primary diet is: Regular Patient reports that she does have regular access to food.   Depression Screen PHQ 2/9 Scores 02/09/2020 10/18/2019 07/15/2019 05/26/2019 04/14/2019 01/24/2019 01/11/2019  PHQ - 2 Score 0 0 0 0 0 1 1  PHQ- 9 Score - - - - - - -  Exception Documentation - - - - - - -     Fall Risk Fall Risk  02/09/2020 10/18/2019 07/15/2019 05/26/2019 04/14/2019  Falls in the past year? 0 0 0 0 0  Number falls in past yr: 0 - - - -  Injury with Fall? 0 - - - -  Risk for fall due to : No Fall Risks - - - -  Follow up Falls evaluation completed - - - -       Objective:  [name] seemed alert and oriented and he participated appropriately during our telephone visit.  Blood Pressure Weight BMI  BP Readings from Last 3 Encounters:  10/18/19 128/75  07/15/19 123/64  05/26/19 127/82   Wt Readings from Last 3 Encounters:  02/09/20 199 lb 1.2 oz (90.3 kg)  10/18/19 199 lb (90.3 kg)  07/15/19 202 lb (91.6 kg)   BMI Readings from Last 1 Encounters:  02/09/20 28.56 kg/m    *Unable to obtain current vital signs, weight, and BMI due to telephone visit type  Hearing/Vision  . Rilyn did not seem to have difficulty with hearing/understanding during the telephone conversation . Reports that he has had a formal eye exam by an eye care professional within the past year . Reports that he has not had a formal hearing evaluation within the past year *Unable to fully assess hearing and vision during telephone visit type  Cognitive Function: 6CIT Screen 02/09/2020  What Year? 0 points  What month? 0 points  What time? 0 points  Count back from 20 0 points  Months in reverse 0 points  Repeat phrase 0 points  Total Score 0   (Normal:0-7, Significant for Dysfunction: >8)  Normal Cognitive Function Screening: Yes   Immunization & Health Maintenance Record Immunization History  Administered Date(s) Administered  . Influenza,inj,Quad PF,6+ Mos 10/06/2016, 10/08/2017, 10/11/2018, 07/15/2019  . Pneumococcal Conjugate-13 12/31/2015  . Pneumococcal Polysaccharide-23 01/06/2017  . Tdap 03/19/2015  . Tetanus 06/25/2004  . Zoster 09/25/2015    Health Maintenance  Topic Date Due  . OPHTHALMOLOGY EXAM  09/15/2019  . HIV Screening  10/17/2020 (Originally 06/19/1971)  . HEMOGLOBIN A1C  04/17/2020  . INFLUENZA VACCINE  06/10/2020  . FOOT EXAM  10/17/2020  . COLONOSCOPY  03/17/2022  . TETANUS/TDAP  03/18/2025  . Hepatitis C Screening  Completed       Assessment  This is a routine wellness examination for Kermitt Marcello.  Health Maintenance: Due or  Overdue Health Maintenance Due  Topic Date Due  . OPHTHALMOLOGY EXAM  09/15/2019    [name] does not need a referral for Community Assistance: Care Valenzuela:   no Social Work:  no Prescription Assistance:  no Nutrition/Diabetes Education:  no   Plan:  Personalized Goals Goals Addressed            This Visit's Progress   . Exercise 150 min/wk Moderate Activity       02/09/2020 AWV Goal: Exercise for General Health   Patient will verbalize understanding of the benefits of increased physical activity:  Exercising regularly is important. It will improve your overall fitness, flexibility, and endurance.  Regular exercise also will improve your overall health. It can help you control your weight, reduce stress, and improve your bone density.  Over the next year, patient will increase physical activity as tolerated with a goal of at least 150 minutes of moderate physical activity per week.   You can tell that you are exercising at a moderate intensity if your heart starts beating faster and you start breathing faster but can still hold a conversation.  Moderate-intensity exercise ideas include:  Walking 1 mile (1.6 km) in about 15 minutes  Biking  Hiking  Golfing  Dancing  Water aerobics  Patient will verbalize understanding of everyday activities that increase physical activity by providing examples like the following: ? Yard work, such as: ? Pushing a Conservation officer, nature ? Raking and bagging leaves ? Washing your car ? Pushing a stroller ? Shoveling snow ? Gardening ? Washing windows or floors  Patient will be able to explain general safety guidelines for exercising:   Before you start a new exercise program, talk with your health care provider.  Do not exercise so much that you hurt yourself, feel dizzy, or get very short of breath.  Wear comfortable clothes and wear shoes with good support.  Drink plenty of water while you exercise to prevent dehydration or  heat stroke.  Work out until your breathing and your heartbeat get faster.       Personalized Health Maintenance & Screening Recommendations  Glaucoma screening Smoking cessation counseling Advanced directives: has NO advanced directive  - add't info requested. Referral to SW: no  Lung Cancer Screening Recommended: yes (Low Dose CT Chest recommended if Age 40-80 years, 30 pack-year currently smoking OR have quit w/in past 15 years) Hepatitis C Screening recommended: no HIV Screening recommended: yes  Advanced Directives: Written information was prepared per patient's request.  Referrals & Orders No orders of the defined types were placed in this encounter.   Follow-up Plan . Follow-up with Michael Pretty, FNP as planned   I have personally reviewed and noted the following in the patient's chart:   . Medical and social history . Use of alcohol, tobacco or illicit drugs  . Current medications and supplements . Functional ability and status . Nutritional status . Physical activity . Advanced directives . List of other physicians . Hospitalizations, surgeries, and ER visits in previous 12 months . Vitals . Screenings to include cognitive, depression, and falls . Referrals and appointments  In addition, I have reviewed and discussed with [name] certain preventive protocols, quality metrics, and best practice recommendations. A written personalized care plan for preventive services as well as general preventive health recommendations is available and can be mailed to the patient at his request.      Wardell Heath, LPN   [id number]    I have reviewed and agree with the above AWV documentation.   [name] Done, FNP

## 2020-02-09 NOTE — Patient Instructions (Addendum)
[facility] Maintenance Summary and Written Plan of Care  [name] ,  Thank you for allowing me to perform your Medicare Annual Wellness Visit and for your ongoing commitment to your health.   Health Maintenance & Immunization History Health Maintenance  Topic Date Due  . OPHTHALMOLOGY EXAM  09/15/2019  . HIV Screening  10/17/2020 (Originally 06/19/1971)  . HEMOGLOBIN A1C  04/17/2020  . INFLUENZA VACCINE  06/10/2020  . FOOT EXAM  10/17/2020  . COLONOSCOPY  03/17/2022  . TETANUS/TDAP  03/18/2025  . Hepatitis C Screening  Completed   Immunization History  Administered Date(s) Administered  . Influenza,inj,Quad PF,6+ Mos 10/06/2016, 10/08/2017, 10/11/2018, 07/15/2019  . Pneumococcal Conjugate-13 12/31/2015  . Pneumococcal Polysaccharide-23 01/06/2017  . Tdap 03/19/2015  . Tetanus 06/25/2004  . Zoster 09/25/2015    These are the patient goals that we discussed: Goals Addressed            This Visit's Progress   . Exercise 150 min/wk Moderate Activity       02/09/2020 AWV Goal: Exercise for General Health   Patient will verbalize understanding of the benefits of increased physical activity:  Exercising regularly is important. It will improve your overall fitness, flexibility, and endurance.  Regular exercise also will improve your overall health. It can help you control your weight, reduce stress, and improve your bone density.  Over the next year, patient will increase physical activity as tolerated with a goal of at least 150 minutes of moderate physical activity per week.   You can tell that you are exercising at a moderate intensity if your heart starts beating faster and you start breathing faster but can still hold a conversation.  Moderate-intensity exercise ideas include:  Walking 1 mile (1.6 km) in about 15 minutes  Biking  Hiking  Golfing  Dancing  Water aerobics  Patient will verbalize understanding of everyday activities  that increase physical activity by providing examples like the following: ? Yard work, such as: ? Pushing a Conservation officer, nature ? Raking and bagging leaves ? Washing your car ? Pushing a stroller ? Shoveling snow ? Gardening ? Washing windows or floors  Patient will be able to explain general safety guidelines for exercising:   Before you start a new exercise program, talk with your health care provider.  Do not exercise so much that you hurt yourself, feel dizzy, or get very short of breath.  Wear comfortable clothes and wear shoes with good support.  Drink plenty of water while you exercise to prevent dehydration or heat stroke.  Work out until your breathing and your heartbeat get faster.         This is a list of Health Maintenance Items that are overdue or due now: Health Maintenance Due  Topic Date Due  . OPHTHALMOLOGY EXAM  09/15/2019     Orders/Referrals Placed Today: No orders of the defined types were placed in this encounter.  (Contact our referral department at [phone] if you have not spoken with someone about your referral appointment within the next 5 days)     Advance Directive  Advance directives are legal documents that let you make choices ahead of time about your health care and medical treatment in case you become unable to communicate for yourself. Advance directives are a way for you to make known your wishes to family, friends, and health care providers. This can let others know about your end-of-life care if you become unable to communicate. Discussing and writing advance  directives should happen over time rather than all at once. Advance directives can be changed depending on your situation and what you want, even after you have signed the advance directives. There are different types of advance directives, such as: Medical power of attorney. Living will. Do not resuscitate (DNR) or do not attempt resuscitation (DNAR) order. Health care proxy and  medical power of attorney A health care proxy is also called a health care agent. This is a person who is appointed to make medical decisions for you in cases where you are unable to make the decisions yourself. Generally, people choose someone they know well and trust to represent their preferences. Make sure to ask this person for an agreement to act as your proxy. A proxy may have to exercise judgment in the event of a medical decision for which your wishes are not known. A medical power of attorney is a legal document that names your health care proxy. Depending on the laws in your state, after the document is written, it may also need to be: Signed. Notarized. Dated. Copied. Witnessed. Incorporated into your medical record. You may also want to appoint someone to manage your money in a situation in which you are unable to do so. This is called a durable power of attorney for finances. It is a separate legal document from the durable power of attorney for health care. You may choose the same person or someone different from your health care proxy to act as your agent in money matters. If you do not appoint a proxy, or if there is a concern that the proxy is not acting in your best interests, a court may appoint a guardian to act on your behalf. Living will A living will is a set of instructions that state your wishes about medical care when you cannot express them yourself. Health care providers should keep a copy of your living will in your medical record. You may want to give a copy to family members or friends. To alert caregivers in case of an emergency, you can place a card in your wallet to let them know that you have a living will and where they can find it. A living will is used if you become: Terminally ill. Disabled. Unable to communicate or make decisions. Items to consider in your living will include: To use or not to use life-support equipment, such as dialysis machines and  breathing machines (ventilators). A DNR or DNAR order. This tells health care providers not to use cardiopulmonary resuscitation (CPR) if breathing or heartbeat stops. To use or not to use tube feeding. To be given or not to be given food and fluids. Comfort (palliative) care when the goal becomes comfort rather than a cure. Donation of organs and tissues. A living will does not give instructions for distributing your money and property if you should pass away. DNR or DNAR A DNR or DNAR order is a request not to have CPR in the event that your heart stops beating or you stop breathing. If a DNR or DNAR order has not been made and shared, a health care provider will try to help any patient whose heart has stopped or who has stopped breathing. If you plan to have surgery, talk with your health care provider about how your DNR or DNAR order will be followed if problems occur. What if I do not have an advance directive? If you do not have an advance directive, some states assign family decision makers  to act on your behalf based on how closely you are related to them. Each state has its own laws about advance directives. You may want to check with your health care provider, attorney, or state representative about the laws in your state. Summary Advance directives are the legal documents that allow you to make choices ahead of time about your health care and medical treatment in case you become unable to tell others about your care. The process of discussing and writing advance directives should happen over time. You can change the advance directives, even after you have signed them. Advance directives include DNR or DNAR orders, living wills, and designating an agent as your medical power of attorney. This information is not intended to replace advice given to you by your health care provider. Make sure you discuss any questions you have with your health care provider. Document Revised: 05/26/2019  Document Reviewed: 05/26/2019 Elsevier Patient Education  [employer].

## 2020-02-17 DIAGNOSIS — Z20822 Contact with and (suspected) exposure to covid-19: Secondary | ICD-10-CM | POA: Diagnosis not present

## 2020-02-19 ENCOUNTER — Other Ambulatory Visit: Payer: Self-pay | Admitting: Nurse Practitioner

## 2020-02-22 DIAGNOSIS — M79662 Pain in left lower leg: Secondary | ICD-10-CM | POA: Diagnosis not present

## 2020-02-22 DIAGNOSIS — M545 Low back pain: Secondary | ICD-10-CM | POA: Diagnosis not present

## 2020-02-22 DIAGNOSIS — M797 Fibromyalgia: Secondary | ICD-10-CM | POA: Diagnosis not present

## 2020-02-22 DIAGNOSIS — G894 Chronic pain syndrome: Secondary | ICD-10-CM | POA: Diagnosis not present

## 2020-02-22 DIAGNOSIS — M25512 Pain in left shoulder: Secondary | ICD-10-CM | POA: Diagnosis not present

## 2020-02-22 DIAGNOSIS — M79661 Pain in right lower leg: Secondary | ICD-10-CM | POA: Diagnosis not present

## 2020-02-22 DIAGNOSIS — M25511 Pain in right shoulder: Secondary | ICD-10-CM | POA: Diagnosis not present

## 2020-02-22 DIAGNOSIS — M542 Cervicalgia: Secondary | ICD-10-CM | POA: Diagnosis not present

## 2020-02-23 DIAGNOSIS — Z23 Encounter for immunization: Secondary | ICD-10-CM | POA: Diagnosis not present

## 2020-03-13 ENCOUNTER — Ambulatory Visit (INDEPENDENT_AMBULATORY_CARE_PROVIDER_SITE_OTHER): Payer: Medicare Other | Admitting: Nurse Practitioner

## 2020-03-13 ENCOUNTER — Encounter: Payer: Self-pay | Admitting: Nurse Practitioner

## 2020-03-13 DIAGNOSIS — E1169 Type 2 diabetes mellitus with other specified complication: Secondary | ICD-10-CM

## 2020-03-13 DIAGNOSIS — E785 Hyperlipidemia, unspecified: Secondary | ICD-10-CM | POA: Diagnosis not present

## 2020-03-13 DIAGNOSIS — Z6830 Body mass index (BMI) 30.0-30.9, adult: Secondary | ICD-10-CM | POA: Diagnosis not present

## 2020-03-13 DIAGNOSIS — Z87891 Personal history of nicotine dependence: Secondary | ICD-10-CM

## 2020-03-13 DIAGNOSIS — I10 Essential (primary) hypertension: Secondary | ICD-10-CM

## 2020-03-13 DIAGNOSIS — E559 Vitamin D deficiency, unspecified: Secondary | ICD-10-CM

## 2020-03-13 NOTE — Progress Notes (Signed)
Virtual Visit via telephone Note Due to COVID-19 pandemic this visit was conducted virtually. This visit type was conducted due to national recommendations for restrictions regarding the COVID-19 Pandemic (e.g. social distancing, sheltering in place) in an effort to limit this patient's exposure and mitigate transmission in our community. All issues noted in this document were discussed and addressed.  A physical exam was not performed with this format.  I connected with Michael Valenzuela on 03/13/20 at 10:45 by telephone and verified that I am speaking with the correct person using two identifiers. Michael Valenzuela is currently located at home and no one is currently with  him during visit. The provider, [name] Done, FNP is located in their office at time of visit.  I discussed the limitations, risks, security and privacy concerns of performing an evaluation and management service by telephone and the availability of in person appointments. I also discussed with the patient that there may be a patient responsible charge related to this service. The patient expressed understanding and agreed to proceed.   History and Present Illness:   Chief Complaint: Medical Management of Chronic Issues    HPI:  1. Essential hypertension, benign No c/o chest pain, ob or headache. Does not check blood pressure at home.] BP Readings from Last 3 Encounters:  10/18/19 128/75  07/15/19 123/64  05/26/19 127/82     2. Hyperlipidemia with target LDL less than 100 He tries to watch hi diet but does not do much exercise due to hi chronic back pain and fibromyalgia. Lab Results  Component Value Date   CHOL 140 10/18/2019   HDL 40 10/18/2019   LDLCALC 56 10/18/2019   TRIG 280 (H) 10/18/2019   CHOLHDL 3.5 10/18/2019     3. Type 2 diabetes mellitus with other specified complication, without long-term current use of insulin ([place]) hgba1c was normal at last visit. No changes were made to medications. He does  not check blood sugars every day. Lab Results  Component Value Date   HGBA1C 6.4 10/18/2019     4. Hx of tobacco use, presenting hazards to health He still smokes at least 1/2 pack a day  5. Vitamin D deficiency Takes daily vitamin d supplement  6. BMI 30.0-30.9,adult No recent weight changes. Wt Readings from Last 3 Encounters:  02/09/20 199 lb 1.2 oz (90.3 kg)  10/18/19 199 lb (90.3 kg)  07/15/19 202 lb (91.6 kg)   BMI Readings from Last 3 Encounters:  02/09/20 28.56 kg/m  10/18/19 28.55 kg/m  07/15/19 28.98 kg/m       Outpatient Encounter Medications as of 03/13/2020  Medication Sig  . amLODipine (NORVASC) 10 MG tablet Take 1 tablet (10 mg total) by mouth daily.  Marland Kitchen aspirin EC 81 MG tablet Take 1 tablet (81 mg total) by mouth daily.  . cyclobenzaprine (FLEXERIL) 10 MG tablet TAKE 1 TABLET BY MOUTH THREE TIMES DAILY AS NEEDED FOR MUSCLE SPASMS  . felodipine (PLENDIL) 10 MG 24 hr tablet Take 1 tablet (10 mg total) by mouth daily.  . fenofibrate 160 MG tablet Take 1 tablet (160 mg total) by mouth daily.  . fluticasone (FLONASE) 50 MCG/ACT nasal spray Place 2 sprays into both nostrils daily.  Marland Kitchen glucose blood test strip TrueTrack strips. Test BS qd. Dx E11.9  . hydrochlorothiazide (HYDRODIURIL) 25 MG tablet Take 1 tablet (25 mg total) by mouth daily.  Elmore Gu[employer] Devices (ACCU-CHEK SOFTCLIX) lancets Use as instructed  . losartan (COZAAR) 100 MG tablet Take 1 tablet (100 mg total)  by mouth daily.  . metFORMIN (GLUCOPHAGE) 1000 MG tablet Take 1 tablet (1,000 mg total) by mouth 2 (two) times daily with a meal.  . oxyCODONE (ROXICODONE) 15 MG immediate release tablet TK 1 T PO Q 6 H  . rosuvastatin (CRESTOR) 10 MG tablet TAKE 1 TABLET(10 MG) BY MOUTH DAILY  . sitaGLIPtin (JANUVIA) 100 MG tablet Take 1 tablet (100 mg total) by mouth daily.     Past Surgical History:  Procedure Laterality Date  . HERNIA REPAIR      Family History  Problem Relation Age of Onset  . Heart  disease Mother   . Diabetes Mother   . Cancer Mother   . Heart disease Father   . Diabetes Son 15       type 1  . Diabetes Paternal Uncle   . Heart attack Brother   . Hypertension Son     New complaints: None today  Social history: Live with wife  Controlled substance contract: n/a    Review of Systems  Constitutional: Negative for diaphoresis and weight loss.  Eyes: Negative for blurred vision, double vision and pain.  Respiratory: Negative for shortness of breath.   Cardiovascular: Negative for chest pain, palpitations, orthopnea and leg swelling.  Gastrointestinal: Negative for abdominal pain.  Skin: Negative for rash.  Neurological: Negative for dizziness, sensory change, loss of consciousness, weakness and headaches.  Endo/Heme/Allergies: Negative for polydipsia. Does not bruise/bleed easily.  Psychiatric/Behavioral: Negative for memory loss. The patient does not have insomnia.   All other systems reviewed and are negative.    Observations/Objective: Alert and oriented- answers all questions appropriately No distress    Assessment and Plan: Michael Valenzuela comes in today with chief complaint of Medical Management of Chronic Issues   Diagnosis and orders addressed:  1. Essential hypertension, benign Low sodium diet  2. Hyperlipidemia with target LDL less than 100 Low fat diet and exercise  3. Type 2 diabetes mellitus with other specified complication, without long-term current use of insulin ([facility]) Watch carb in diet  4. Hx of tobacco use, presenting hazards to health smoking cessation enouraged  5. Vitamin D deficiency Continue daily vitamin d supplement daily  6. BMI 30.0-30.9,adult Discussed diet and exercise for person with BMI >25 Will recheck weight in 3-6 months    Labs pending Health Maintenance reviewed Diet and exercise encouraged  Follow up plan: 3 month     I discussed the assessment and treatment plan with the patient. The  patient was provided an opportunity to ask questions and all were answered. The patient agreed with the plan and demonstrated an understanding of the instructions.   The patient was advised to call back or seek an in-person evaluation if the symptoms worsen or if the condition fails to improve as anticipated.  The above assessment and management plan was discussed with the patient. The patient verbalized understanding of and has agreed to the management plan. Patient is aware to call the clinic if symptoms persist or worsen. Patient is aware when to return to the clinic for a follow-up visit. Patient educated on when it is appropriate to go to the emergency department.   Time call ended:  11:00  I provided 15 minutes of non-face-to-face time during this encounter.    [name] Done, FNP

## 2020-03-15 DIAGNOSIS — Z23 Encounter for immunization: Secondary | ICD-10-CM | POA: Diagnosis not present

## 2020-03-16 DIAGNOSIS — Z03818 Encounter for observation for suspected exposure to other biological agents ruled out: Secondary | ICD-10-CM | POA: Diagnosis not present

## 2020-03-21 DIAGNOSIS — M545 Low back pain: Secondary | ICD-10-CM | POA: Diagnosis not present

## 2020-03-21 DIAGNOSIS — M542 Cervicalgia: Secondary | ICD-10-CM | POA: Diagnosis not present

## 2020-03-21 DIAGNOSIS — M25512 Pain in left shoulder: Secondary | ICD-10-CM | POA: Diagnosis not present

## 2020-03-21 DIAGNOSIS — M25511 Pain in right shoulder: Secondary | ICD-10-CM | POA: Diagnosis not present

## 2020-03-21 DIAGNOSIS — M79661 Pain in right lower leg: Secondary | ICD-10-CM | POA: Diagnosis not present

## 2020-03-21 DIAGNOSIS — M79662 Pain in left lower leg: Secondary | ICD-10-CM | POA: Diagnosis not present

## 2020-03-21 DIAGNOSIS — M797 Fibromyalgia: Secondary | ICD-10-CM | POA: Diagnosis not present

## 2020-03-21 DIAGNOSIS — G894 Chronic pain syndrome: Secondary | ICD-10-CM | POA: Diagnosis not present

## 2020-03-27 ENCOUNTER — Other Ambulatory Visit: Payer: Self-pay | Admitting: Nurse Practitioner

## 2020-03-27 NOTE — Telephone Encounter (Signed)
Last office visit 03/13/2020 Upcoming appointment 06/11/2020 Last refill 02/20/2020, #30, no refills

## 2020-04-18 DIAGNOSIS — M797 Fibromyalgia: Secondary | ICD-10-CM | POA: Diagnosis not present

## 2020-04-18 DIAGNOSIS — M542 Cervicalgia: Secondary | ICD-10-CM | POA: Diagnosis not present

## 2020-04-18 DIAGNOSIS — G894 Chronic pain syndrome: Secondary | ICD-10-CM | POA: Diagnosis not present

## 2020-04-18 DIAGNOSIS — M79662 Pain in left lower leg: Secondary | ICD-10-CM | POA: Diagnosis not present

## 2020-04-18 DIAGNOSIS — M79661 Pain in right lower leg: Secondary | ICD-10-CM | POA: Diagnosis not present

## 2020-04-18 DIAGNOSIS — M25512 Pain in left shoulder: Secondary | ICD-10-CM | POA: Diagnosis not present

## 2020-04-18 DIAGNOSIS — M545 Low back pain: Secondary | ICD-10-CM | POA: Diagnosis not present

## 2020-04-18 DIAGNOSIS — M25511 Pain in right shoulder: Secondary | ICD-10-CM | POA: Diagnosis not present

## 2020-06-11 ENCOUNTER — Encounter: Payer: Self-pay | Admitting: Nurse Practitioner

## 2020-06-11 ENCOUNTER — Other Ambulatory Visit: Payer: Self-pay

## 2020-06-11 ENCOUNTER — Ambulatory Visit (INDEPENDENT_AMBULATORY_CARE_PROVIDER_SITE_OTHER): Payer: Medicare Other | Admitting: Nurse Practitioner

## 2020-06-11 VITALS — BP 127/75 | HR 82 | Temp 97.8°F | Resp 20 | Ht 70.0 in | Wt 202.0 lb

## 2020-06-11 DIAGNOSIS — M797 Fibromyalgia: Secondary | ICD-10-CM

## 2020-06-11 DIAGNOSIS — I10 Essential (primary) hypertension: Secondary | ICD-10-CM | POA: Diagnosis not present

## 2020-06-11 DIAGNOSIS — G8929 Other chronic pain: Secondary | ICD-10-CM | POA: Diagnosis not present

## 2020-06-11 DIAGNOSIS — E785 Hyperlipidemia, unspecified: Secondary | ICD-10-CM | POA: Diagnosis not present

## 2020-06-11 DIAGNOSIS — M5440 Lumbago with sciatica, unspecified side: Secondary | ICD-10-CM

## 2020-06-11 DIAGNOSIS — Z6830 Body mass index (BMI) 30.0-30.9, adult: Secondary | ICD-10-CM | POA: Diagnosis not present

## 2020-06-11 DIAGNOSIS — R972 Elevated prostate specific antigen [PSA]: Secondary | ICD-10-CM | POA: Diagnosis not present

## 2020-06-11 DIAGNOSIS — Z87891 Personal history of nicotine dependence: Secondary | ICD-10-CM

## 2020-06-11 DIAGNOSIS — E559 Vitamin D deficiency, unspecified: Secondary | ICD-10-CM

## 2020-06-11 DIAGNOSIS — Z125 Encounter for screening for malignant neoplasm of prostate: Secondary | ICD-10-CM | POA: Diagnosis not present

## 2020-06-11 DIAGNOSIS — E1169 Type 2 diabetes mellitus with other specified complication: Secondary | ICD-10-CM

## 2020-06-11 LAB — BAYER DCA HB A1C WAIVED: HB A1C (BAYER DCA - WAIVED): 6.4 %

## 2020-06-11 MED ORDER — METFORMIN HCL 1000 MG PO TABS: 1000.0000 mg | ORAL_TABLET | Freq: Two times a day (BID) | ORAL | 1 refills | Status: DC

## 2020-06-11 MED ORDER — FELODIPINE ER 10 MG PO TB24: 10.0000 mg | ORAL_TABLET | Freq: Every day | ORAL | 1 refills | Status: DC

## 2020-06-11 MED ORDER — HYDROCHLOROTHIAZIDE 25 MG PO TABS: 25.0000 mg | ORAL_TABLET | Freq: Every day | ORAL | 1 refills | Status: DC

## 2020-06-11 MED ORDER — ROSUVASTATIN CALCIUM 10 MG PO TABS: ORAL_TABLET | ORAL | 1 refills | Status: DC

## 2020-06-11 MED ORDER — AMLODIPINE BESYLATE 10 MG PO TABS: 10.0000 mg | ORAL_TABLET | Freq: Every day | ORAL | 1 refills | Status: DC

## 2020-06-11 MED ORDER — FENOFIBRATE 160 MG PO TABS: 160.0000 mg | ORAL_TABLET | Freq: Every day | ORAL | 1 refills | Status: DC

## 2020-06-11 MED ORDER — SITAGLIPTIN PHOSPHATE 100 MG PO TABS: 100.0000 mg | ORAL_TABLET | Freq: Every day | ORAL | 1 refills | Status: DC

## 2020-06-11 MED ORDER — LOSARTAN POTASSIUM 100 MG PO TABS: 100.0000 mg | ORAL_TABLET | Freq: Every day | ORAL | 1 refills | Status: DC

## 2020-06-11 NOTE — Patient Instructions (Signed)
Musculoskeletal Pain Musculoskeletal pain refers to aches and pains in your bones, joints, muscles, and the tissues that surround them. This pain can occur in any part of the body. It can last for a short time (acute) or a long time (chronic). A physical exam, lab tests, and imaging studies may be done to find the cause of your musculoskeletal pain. Follow these instructions at home:  Lifestyle  Try to control or lower your stress levels. Stress increases muscle tension and can worsen musculoskeletal pain. It is important to recognize when you are anxious or stressed and learn ways to manage it. This may include: ? Meditation or yoga. ? Cognitive or behavioral therapy. ? Acupuncture or massage therapy.  You may continue all activities unless the activities cause more pain. When the pain gets better, slowly resume your normal activities. Gradually increase the intensity and duration of your activities or exercise. Managing pain, stiffness, and swelling  Take over-the-counter and prescription medicines only as told by your health care provider.  When your pain is severe, bed rest may be helpful. Lie or sit in any position that is comfortable, but get out of bed and walk around at least every couple of hours.  If directed, apply heat to the affected area as often as told by your health care provider. Use the heat source that your health care provider recommends, such as a moist heat pack or a heating pad. ? Place a towel between your skin and the heat source. ? Leave the heat on for 20-30 minutes. ? Remove the heat if your skin turns bright red. This is especially important if you are unable to feel pain, heat, or cold. You may have a greater risk of getting burned.  If directed, put ice on the painful area. ? Put ice in a plastic bag. ? Place a towel between your skin and the bag. ? Leave the ice on for 20 minutes, 2-3 times a day. General instructions  Your health care provider may  recommend that you see a physical therapist. This person can help you come up with a safe exercise program. Do any exercises as told by your physical therapist.  Keep all follow-up visits, including any physical therapy visits, as told by your health care providers. This is important. Contact a health care provider if:  Your pain gets worse.  Medicines do not help ease your pain.  You cannot use the part of your body that hurts, such as your arm, leg, or neck.  You have trouble sleeping.  You have trouble doing your normal activities. Get help right away if:  You have a new injury and your pain is worse or different.  You feel numb or you have tingling in the painful area. Summary  Musculoskeletal pain refers to aches and pains in your bones, joints, muscles, and the tissues that surround them.  This pain can occur in any part of the body.  Your health care provider may recommend that you see a physical therapist. This person can help you come up with a safe exercise program. Do any exercises as told by your physical therapist.  Lower your stress level. Stress can worsen musculoskeletal pain. Ways to lower stress may include meditation, yoga, cognitive or behavioral therapy, acupuncture, and massage therapy. This information is not intended to replace advice given to you by your health care provider. Make sure you discuss any questions you have with your health care provider. Document Revised: 10/09/2017 Document Reviewed: 11/26/2016 Elsevier Patient   Education  2020 [employer]  

## 2020-06-11 NOTE — Progress Notes (Signed)
Subjective:    Patient ID: Michael Valenzuela, male    DOB: 10-20-1956, 64 y.o.   MRN: [id number]   Chief Complaint: Medical Management of Chronic Issues    HPI:  1. Essential hypertension, benign No c/o chest pain, sob or headache. Does not check blood pressure at home. BP Readings from Last 3 Encounters:  06/11/20 127/75  10/18/19 128/75  07/15/19 123/64     2. Hyperlipidemia with target LDL less than 100 Does not watch diet and does very little to no exercise. Lab Results  Component Value Date   CHOL 140 10/18/2019   HDL 40 10/18/2019   LDLCALC 56 10/18/2019   TRIG 280 (H) 10/18/2019   CHOLHDL 3.5 10/18/2019     3. Type 2 diabetes mellitus with other specified complication, without long-term current use of insulin (HCC) Fasting blood sugars have ranged from 120-145 most of th etime. He has n ot been watching his diet. Lab Results  Component Value Date   HGBA1C 6.4 10/18/2019     4. Prostate cancer screening Has been normal. Lab Results  Component Value Date   PSA1 3.4 04/14/2019   PSA1 2.5 12/31/2015   PSA1 1.5 06/21/2015   PSA 1.8 08/31/2013      5. Vitamin D deficiency Takes daily vitamin d supplement  6. Fibromyalgia Achy all the time.  7. Chronic midline low back pain with sciatica, sciatica laterality unspecified He says his neck pain has increased. He is seeing pain management and he discussed it with them and they have really done nothing. He is currently on roxicodone. He sees them monthly.  8. Hx of tobacco use, presenting hazards to health Still smoking over a pck a day  9. BMI 30.0-30.9,adult No recent weight changes Wt Readings from Last 3 Encounters:  06/11/20 202 lb (91.6 kg)  02/09/20 199 lb 1.2 oz (90.3 kg)  10/18/19 199 lb (90.3 kg)   BMI Readings from Last 3 Encounters:  06/11/20 28.98 kg/m  02/09/20 28.56 kg/m  10/18/19 28.55 kg/m       Outpatient Encounter Medications as of 06/11/2020  Medication Sig  . amLODipine  (NORVASC) 10 MG tablet Take 1 tablet (10 mg total) by mouth daily.  Marland Kitchen aspirin EC 81 MG tablet Take 1 tablet (81 mg total) by mouth daily.  . cyclobenzaprine (FLEXERIL) 10 MG tablet TAKE 1 TABLET BY MOUTH THREE TIMES DAILY AS NEEDED FOR MUSCLE SPASMS  . felodipine (PLENDIL) 10 MG 24 hr tablet Take 1 tablet (10 mg total) by mouth daily.  . fenofibrate 160 MG tablet Take 1 tablet (160 mg total) by mouth daily.  Marland Kitchen glucose blood test strip TrueTrack strips. Test BS qd. Dx E11.9  . hydrochlorothiazide (HYDRODIURIL) 25 MG tablet Take 1 tablet (25 mg total) by mouth daily.  Elmore Guise Devices (ACCU-CHEK SOFTCLIX) lancets Use as instructed  . metFORMIN (GLUCOPHAGE) 1000 MG tablet Take 1 tablet (1,000 mg total) by mouth 2 (two) times daily with a meal.  . oxyCODONE (ROXICODONE) 15 MG immediate release tablet TK 1 T PO Q 6 H  . rosuvastatin (CRESTOR) 10 MG tablet TAKE 1 TABLET(10 MG) BY MOUTH DAILY  . sitaGLIPtin (JANUVIA) 100 MG tablet Take 1 tablet (100 mg total) by mouth daily.  . fluticasone (FLONASE) 50 MCG/ACT nasal spray Place 2 sprays into both nostrils daily. (Patient not taking: Reported on 06/11/2020)  . losartan (COZAAR) 100 MG tablet Take 1 tablet (100 mg total) by mouth daily.     Past Surgical History:  Procedure Laterality Date  . HERNIA REPAIR      Family History  Problem Relation Age of Onset  . Heart disease Mother   . Diabetes Mother   . Cancer Mother   . Heart disease Father   . Diabetes Son 15       type 1  . Diabetes Paternal Uncle   . Heart attack Brother   . Hypertension Son     New complaints: *has been anxious and depressed lately. His pain is depressing him as well as his wife's health  Social history: Is on disability and lives with his wife  Controlled substance contract: n/a    Review of Systems  Constitutional: Negative for diaphoresis.  Eyes: Negative for pain.  Respiratory: Negative for shortness of breath.   Cardiovascular: Negative for chest pain,  palpitations and leg swelling.  Gastrointestinal: Negative for abdominal pain.  Endocrine: Negative for polydipsia.  Musculoskeletal: Positive for arthralgias, back pain and myalgias.  Skin: Negative for rash.  Neurological: Negative for dizziness, weakness and headaches.  Hematological: Does not bruise/bleed easily.  All other systems reviewed and are negative.      Objective:   Physical Exam Vitals and nursing note reviewed.  Constitutional:      Appearance: Normal appearance. He is well-developed.  HENT:     Head: Normocephalic.     Nose: Nose normal.  Eyes:     Pupils: Pupils are equal, round, and reactive to light.  Neck:     Thyroid: No thyroid mass or thyromegaly.     Vascular: No carotid bruit or JVD.     Trachea: Phonation normal.  Cardiovascular:     Rate and Rhythm: Normal rate and regular rhythm.     Heart sounds: Murmur (2/6 heard loudest at pulmonic valve) heard.   Pulmonary:     Effort: Pulmonary effort is normal. No respiratory distress.     Breath sounds: Normal breath sounds.  Abdominal:     General: Bowel sounds are normal.     Palpations: Abdomen is soft.     Tenderness: There is no abdominal tenderness.  Musculoskeletal:        General: Normal range of motion.     Cervical back: Normal range of motion and neck supple.     Comments: Moves slowly from sitting to standing Walks with limp in office  Lymphadenopathy:     Cervical: No cervical adenopathy.  Skin:    General: Skin is warm and dry.  Neurological:     Mental Status: He is alert and oriented to person, place, and time.  Psychiatric:        Behavior: Behavior normal.        Thought Content: Thought content normal.        Judgment: Judgment normal.    BP 127/75   Pulse 82   Temp 97.8 F (36.6 C) (Temporal)   Resp 20   Ht _0  (1.778 m)   Wt 202 lb (91.6 kg)   SpO2 95%   BMI 28.98 kg/m   hgba1c 6.4      Assessment & Plan:  Michael Valenzuela comes in today with chief complaint of  Medical Management of Chronic Issues   Diagnosis and orders addressed:  1. Essential hypertension, benign Low sodium diet - CBC with Differential/Platelet - CMP14+EGFR - hydrochlorothiazide (HYDRODIURIL) 25 MG tablet; Take 1 tablet (25 mg total) by mouth daily.  Dispense: 90 tablet; Refill: 1 - losartan (COZAAR) 100 MG tablet; Take 1 tablet (100 mg  total) by mouth daily.  Dispense: 90 tablet; Refill: 1 - amLODipine (NORVASC) 10 MG tablet; Take 1 tablet (10 mg total) by mouth daily.  Dispense: 90 tablet; Refill: 1  2. Hyperlipidemia with target LDL less than 100 Low fat diet - Lipid panel - fenofibrate 160 MG tablet; Take 1 tablet (160 mg total) by mouth daily.  Dispense: 90 tablet; Refill: 1 - rosuvastatin (CRESTOR) 10 MG tablet; TAKE 1 TABLET(10 MG) BY MOUTH DAILY  Dispense: 90 tablet; Refill: 1  3. Type 2 diabetes mellitus with other specified complication, without long-term current use of insulin (HCC) Continue to watch carbs in diet - Bayer DCA Hb A1c Waived - sitaGLIPtin (JANUVIA) 100 MG tablet; Take 1 tablet (100 mg total) by mouth daily.  Dispense: 90 tablet; Refill: 1 - metFORMIN (GLUCOPHAGE) 1000 MG tablet; Take 1 tablet (1,000 mg total) by mouth 2 (two) times daily with a meal.  Dispense: 180 tablet; Refill: 1  4. Prostate cancer screening Labs pending  5. Vitamin D deficiency Continue daily vitamind supplement  6. Fibromyalgia Moist heat Stay warm Exercise will help  7. Chronic midline low back pain with sciatica, sciatica laterality unspecified Will talk with pain management at appointmnet  8. Hx of tobacco use, presenting hazards to health Smoking cessation encouraged  9. BMI 30.0-30.9,adult Discussed diet and exercise for person with BMI >25 Will recheck weight in 3-6 months   Labs pending Health Maintenance reviewed Diet and exercise encouraged  Follow up plan: 3 months   [name] Done, FNP

## 2020-06-12 LAB — CMP14+EGFR
ALT: 40 IU/L (ref 0–44)
AST: 45 IU/L — ABNORMAL HIGH (ref 0–40)
Albumin/Globulin Ratio: 2.5 — ABNORMAL HIGH (ref 1.2–2.2)
Albumin: 4.8 g/dL (ref 3.8–4.8)
Alkaline Phosphatase: 55 IU/L (ref 48–121)
BUN/Creatinine Ratio: 17 (ref 10–24)
BUN: 17 mg/dL (ref 8–27)
Bilirubin Total: 0.2 mg/dL (ref 0.0–1.2)
CO2: 25 mmol/L (ref 20–29)
Calcium: 10.7 mg/dL — ABNORMAL HIGH (ref 8.6–10.2)
Chloride: 99 mmol/L (ref 96–106)
Creatinine, Ser: 1 mg/dL (ref 0.76–1.27)
GFR calc Af Amer: 92 mL/min/{1.73_m2} (ref >59)
GFR calc non Af Amer: 79 mL/min/{1.73_m2} (ref >59)
Globulin, Total: 1.9 g/dL (ref 1.5–4.5)
Glucose: 217 mg/dL — ABNORMAL HIGH (ref 65–99)
Potassium: 4.7 mmol/L (ref 3.5–5.2)
Sodium: 140 mmol/L (ref 134–144)
Total Protein: 6.7 g/dL (ref 6.0–8.5)

## 2020-06-12 LAB — LIPID PANEL
Chol/HDL Ratio: 4 ratio (ref 0.0–5.0)
Cholesterol, Total: 143 mg/dL (ref 100–199)
HDL: 36 mg/dL — ABNORMAL LOW (ref >39)
LDL Chol Calc (NIH): 41 mg/dL (ref 0–99)
Triglycerides: 454 mg/dL — ABNORMAL HIGH (ref 0–149)
VLDL Cholesterol Cal: 66 mg/dL — ABNORMAL HIGH (ref 5–40)

## 2020-06-12 LAB — CBC WITH DIFFERENTIAL/PLATELET
Basophils Absolute: 0.1 10*3/uL (ref 0.0–0.2)
Basos: 1 %
EOS (ABSOLUTE): 0.2 10*3/uL (ref 0.0–0.4)
Eos: 3 %
Hematocrit: 46.9 % (ref 37.5–51.0)
Hemoglobin: 15.9 g/dL (ref 13.0–17.7)
Immature Grans (Abs): 0 10*3/uL (ref 0.0–0.1)
Immature Granulocytes: 0 %
Lymphocytes Absolute: 2.1 10*3/uL (ref 0.7–3.1)
Lymphs: 23 %
MCH: 30.8 pg (ref 26.6–33.0)
MCHC: 33.9 g/dL (ref 31.5–35.7)
MCV: 91 fL (ref 79–97)
Monocytes Absolute: 0.7 10*3/uL (ref 0.1–0.9)
Monocytes: 7 %
Neutrophils Absolute: 6.1 10*3/uL (ref 1.4–7.0)
Neutrophils: 66 %
Platelets: 267 10*3/uL (ref 150–450)
RBC: 5.17 x10E6/uL (ref 4.14–5.80)
RDW: 12.6 % (ref 11.6–15.4)
WBC: 9.3 10*3/uL (ref 3.4–10.8)

## 2020-06-12 LAB — SPECIMEN STATUS REPORT

## 2020-06-18 ENCOUNTER — Other Ambulatory Visit: Payer: Self-pay | Admitting: Nurse Practitioner

## 2020-06-20 ENCOUNTER — Other Ambulatory Visit: Payer: Self-pay | Admitting: Nurse Practitioner

## 2020-06-20 DIAGNOSIS — I10 Essential (primary) hypertension: Secondary | ICD-10-CM

## 2020-06-20 DIAGNOSIS — E785 Hyperlipidemia, unspecified: Secondary | ICD-10-CM

## 2020-07-03 DIAGNOSIS — M25512 Pain in left shoulder: Secondary | ICD-10-CM | POA: Diagnosis not present

## 2020-07-03 DIAGNOSIS — M542 Cervicalgia: Secondary | ICD-10-CM | POA: Diagnosis not present

## 2020-07-03 DIAGNOSIS — M797 Fibromyalgia: Secondary | ICD-10-CM | POA: Diagnosis not present

## 2020-07-03 DIAGNOSIS — M545 Low back pain: Secondary | ICD-10-CM | POA: Diagnosis not present

## 2020-07-03 DIAGNOSIS — M79661 Pain in right lower leg: Secondary | ICD-10-CM | POA: Diagnosis not present

## 2020-07-03 DIAGNOSIS — M5412 Radiculopathy, cervical region: Secondary | ICD-10-CM | POA: Diagnosis not present

## 2020-07-03 DIAGNOSIS — G894 Chronic pain syndrome: Secondary | ICD-10-CM | POA: Diagnosis not present

## 2020-07-03 DIAGNOSIS — M79662 Pain in left lower leg: Secondary | ICD-10-CM | POA: Diagnosis not present

## 2020-07-03 DIAGNOSIS — M25511 Pain in right shoulder: Secondary | ICD-10-CM | POA: Diagnosis not present

## 2020-07-27 DIAGNOSIS — M25512 Pain in left shoulder: Secondary | ICD-10-CM | POA: Diagnosis not present

## 2020-07-27 DIAGNOSIS — M79661 Pain in right lower leg: Secondary | ICD-10-CM | POA: Diagnosis not present

## 2020-07-27 DIAGNOSIS — M545 Low back pain: Secondary | ICD-10-CM | POA: Diagnosis not present

## 2020-07-27 DIAGNOSIS — M542 Cervicalgia: Secondary | ICD-10-CM | POA: Diagnosis not present

## 2020-07-27 DIAGNOSIS — G894 Chronic pain syndrome: Secondary | ICD-10-CM | POA: Diagnosis not present

## 2020-07-27 DIAGNOSIS — M79662 Pain in left lower leg: Secondary | ICD-10-CM | POA: Diagnosis not present

## 2020-07-27 DIAGNOSIS — M797 Fibromyalgia: Secondary | ICD-10-CM | POA: Diagnosis not present

## 2020-07-27 DIAGNOSIS — M25511 Pain in right shoulder: Secondary | ICD-10-CM | POA: Diagnosis not present

## 2020-08-24 DIAGNOSIS — M79662 Pain in left lower leg: Secondary | ICD-10-CM | POA: Diagnosis not present

## 2020-08-24 DIAGNOSIS — M545 Low back pain, unspecified: Secondary | ICD-10-CM | POA: Diagnosis not present

## 2020-08-24 DIAGNOSIS — M797 Fibromyalgia: Secondary | ICD-10-CM | POA: Diagnosis not present

## 2020-08-24 DIAGNOSIS — M79661 Pain in right lower leg: Secondary | ICD-10-CM | POA: Diagnosis not present

## 2020-08-24 DIAGNOSIS — G894 Chronic pain syndrome: Secondary | ICD-10-CM | POA: Diagnosis not present

## 2020-08-24 DIAGNOSIS — M542 Cervicalgia: Secondary | ICD-10-CM | POA: Diagnosis not present

## 2020-08-24 DIAGNOSIS — M25512 Pain in left shoulder: Secondary | ICD-10-CM | POA: Diagnosis not present

## 2020-08-24 DIAGNOSIS — M25511 Pain in right shoulder: Secondary | ICD-10-CM | POA: Diagnosis not present

## 2020-08-28 ENCOUNTER — Other Ambulatory Visit: Payer: Self-pay | Admitting: Nurse Practitioner

## 2020-09-14 ENCOUNTER — Ambulatory Visit: Payer: Self-pay | Admitting: Nurse Practitioner

## 2020-09-14 DIAGNOSIS — M25512 Pain in left shoulder: Secondary | ICD-10-CM | POA: Diagnosis not present

## 2020-09-21 DIAGNOSIS — M79662 Pain in left lower leg: Secondary | ICD-10-CM | POA: Diagnosis not present

## 2020-09-21 DIAGNOSIS — G894 Chronic pain syndrome: Secondary | ICD-10-CM | POA: Diagnosis not present

## 2020-09-21 DIAGNOSIS — M25512 Pain in left shoulder: Secondary | ICD-10-CM | POA: Diagnosis not present

## 2020-09-21 DIAGNOSIS — M79661 Pain in right lower leg: Secondary | ICD-10-CM | POA: Diagnosis not present

## 2020-09-21 DIAGNOSIS — M797 Fibromyalgia: Secondary | ICD-10-CM | POA: Diagnosis not present

## 2020-09-21 DIAGNOSIS — M542 Cervicalgia: Secondary | ICD-10-CM | POA: Diagnosis not present

## 2020-09-21 DIAGNOSIS — M25511 Pain in right shoulder: Secondary | ICD-10-CM | POA: Diagnosis not present

## 2020-09-21 DIAGNOSIS — M545 Low back pain, unspecified: Secondary | ICD-10-CM | POA: Diagnosis not present

## 2020-09-27 ENCOUNTER — Encounter: Payer: Self-pay | Admitting: Nurse Practitioner

## 2020-09-27 ENCOUNTER — Ambulatory Visit (INDEPENDENT_AMBULATORY_CARE_PROVIDER_SITE_OTHER): Payer: Medicare Other | Admitting: Nurse Practitioner

## 2020-09-27 ENCOUNTER — Other Ambulatory Visit: Payer: Self-pay

## 2020-09-27 VITALS — BP 130/77 | HR 67 | Temp 97.1°F | Resp 20 | Ht 70.0 in | Wt 198.0 lb

## 2020-09-27 DIAGNOSIS — E785 Hyperlipidemia, unspecified: Secondary | ICD-10-CM

## 2020-09-27 DIAGNOSIS — Z125 Encounter for screening for malignant neoplasm of prostate: Secondary | ICD-10-CM

## 2020-09-27 DIAGNOSIS — I10 Essential (primary) hypertension: Secondary | ICD-10-CM

## 2020-09-27 DIAGNOSIS — E1169 Type 2 diabetes mellitus with other specified complication: Secondary | ICD-10-CM

## 2020-09-27 DIAGNOSIS — Z23 Encounter for immunization: Secondary | ICD-10-CM

## 2020-09-27 DIAGNOSIS — Z6830 Body mass index (BMI) 30.0-30.9, adult: Secondary | ICD-10-CM

## 2020-09-27 LAB — CMP14+EGFR
ALT: 43 IU/L (ref 0–44)
AST: 47 IU/L — ABNORMAL HIGH (ref 0–40)
Albumin/Globulin Ratio: 2.1 (ref 1.2–2.2)
Albumin: 4.8 g/dL (ref 3.8–4.8)
Alkaline Phosphatase: 59 IU/L (ref 44–121)
BUN/Creatinine Ratio: 22 (ref 10–24)
BUN: 20 mg/dL (ref 8–27)
Bilirubin Total: 0.2 mg/dL (ref 0.0–1.2)
CO2: 23 mmol/L (ref 20–29)
Calcium: 10 mg/dL (ref 8.6–10.2)
Chloride: 103 mmol/L (ref 96–106)
Creatinine, Ser: 0.93 mg/dL (ref 0.76–1.27)
GFR calc Af Amer: 99 mL/min/{1.73_m2} (ref >59)
GFR calc non Af Amer: 86 mL/min/{1.73_m2} (ref >59)
Globulin, Total: 2.3 g/dL (ref 1.5–4.5)
Glucose: 212 mg/dL — ABNORMAL HIGH (ref 65–99)
Potassium: 4.7 mmol/L (ref 3.5–5.2)
Sodium: 139 mmol/L (ref 134–144)
Total Protein: 7.1 g/dL (ref 6.0–8.5)

## 2020-09-27 LAB — LIPID PANEL
Chol/HDL Ratio: 3.2 ratio (ref 0.0–5.0)
Cholesterol, Total: 129 mg/dL (ref 100–199)
HDL: 40 mg/dL (ref >39)
LDL Chol Calc (NIH): 48 mg/dL (ref 0–99)
Triglycerides: 266 mg/dL — ABNORMAL HIGH (ref 0–149)
VLDL Cholesterol Cal: 41 mg/dL — ABNORMAL HIGH (ref 5–40)

## 2020-09-27 LAB — CBC WITH DIFFERENTIAL/PLATELET
Basophils Absolute: 0.1 10*3/uL (ref 0.0–0.2)
Basos: 1 %
EOS (ABSOLUTE): 0.2 10*3/uL (ref 0.0–0.4)
Eos: 2 %
Hematocrit: 44.7 % (ref 37.5–51.0)
Hemoglobin: 15.3 g/dL (ref 13.0–17.7)
Immature Grans (Abs): 0 10*3/uL (ref 0.0–0.1)
Immature Granulocytes: 0 %
Lymphocytes Absolute: 1.6 10*3/uL (ref 0.7–3.1)
Lymphs: 21 %
MCH: 30.6 pg (ref 26.6–33.0)
MCHC: 34.2 g/dL (ref 31.5–35.7)
MCV: 89 fL (ref 79–97)
Monocytes Absolute: 0.6 10*3/uL (ref 0.1–0.9)
Monocytes: 8 %
Neutrophils Absolute: 5.1 10*3/uL (ref 1.4–7.0)
Neutrophils: 68 %
Platelets: 241 10*3/uL (ref 150–450)
RBC: 5 x10E6/uL (ref 4.14–5.80)
RDW: 12.2 % (ref 11.6–15.4)
WBC: 7.6 10*3/uL (ref 3.4–10.8)

## 2020-09-27 LAB — BAYER DCA HB A1C WAIVED: HB A1C (BAYER DCA - WAIVED): 6.4 % (ref <7.0)

## 2020-09-27 MED ORDER — METFORMIN HCL 1000 MG PO TABS: 1000.0000 mg | ORAL_TABLET | Freq: Two times a day (BID) | ORAL | 1 refills | Status: DC

## 2020-09-27 MED ORDER — LOSARTAN POTASSIUM 100 MG PO TABS: 100.0000 mg | ORAL_TABLET | Freq: Every day | ORAL | 1 refills | Status: DC

## 2020-09-27 MED ORDER — HYDROCHLOROTHIAZIDE 25 MG PO TABS: 25.0000 mg | ORAL_TABLET | Freq: Every day | ORAL | 1 refills | Status: DC

## 2020-09-27 MED ORDER — AMLODIPINE BESYLATE 10 MG PO TABS: 10.0000 mg | ORAL_TABLET | Freq: Every day | ORAL | 1 refills | Status: DC

## 2020-09-27 MED ORDER — FENOFIBRATE 160 MG PO TABS: 160.0000 mg | ORAL_TABLET | Freq: Every day | ORAL | 1 refills | Status: DC

## 2020-09-27 MED ORDER — SITAGLIPTIN PHOSPHATE 100 MG PO TABS: 100.0000 mg | ORAL_TABLET | Freq: Every day | ORAL | 1 refills | Status: DC

## 2020-09-27 MED ORDER — ROSUVASTATIN CALCIUM 10 MG PO TABS: ORAL_TABLET | ORAL | 1 refills | Status: DC

## 2020-09-27 NOTE — Progress Notes (Signed)
Subjective:    Patient ID: Michael Valenzuela, male    DOB: [birth year], 64 y.o.   MRN: [id number]   Chief Complaint: medical management of chronic issues     HPI:  1. Essential hypertension, benign BP Readings from Last 3 Encounters:  09/27/20 130/77  06/11/20 127/75  10/18/19 128/75  Checks blood pressure at home regularly, ~125-140s/70s-80s, Takes medication as prescribed. Does not watch diet frequently, avoids salt intake sometimes. Denies chest pain, SOB, or headaches. Smokes ~10 cigarettes a day.    2. Hyperlipidemia with target LDL less than 100 Lab Results  Component Value Date   CHOL 143 06/11/2020   HDL 36 (L) 06/11/2020   LDLCALC 41 06/11/2020   TRIG 454 (H) 06/11/2020   CHOLHDL 4.0 06/11/2020  Takes medication as prescribed. Avoids fried or fatty foods.    3. Type 2 diabetes mellitus with other specified complication, without long-term current use of insulin (HCC) Lab Results  Component Value Date   HGBA1C 6.4 06/11/2020  Checks CBG at home, ~120-145. Takes medication as prescribed. Avoids carbs and sugar once in a while. Eats breads but lays off of sugar.    4. Prostate cancer screening Lab Results  Component Value Date   PSA1 3.4 04/14/2019   PSA1 2.5 12/31/2015   PSA1 1.5 06/21/2015   PSA 1.8 08/31/2013  No difficulty urinating but thinks he is having trouble emptying his bladder. He has seen urology in the past.   5. BMI 30.0-30.9,adult Wt Readings from Last 3 Encounters:  09/27/20 198 lb (89.8 kg)  06/11/20 202 lb (91.6 kg)  02/09/20 199 lb 1.2 oz (90.3 kg)   BMI Readings from Last 3 Encounters:  09/27/20 28.41 kg/m  06/11/20 28.98 kg/m  02/09/20 28.56 kg/m  No formal exercise, does yard work and cuts wood when able.   * Chronic low back pain is taken care of by pain management.  Outpatient Encounter Medications as of 09/27/2020  Medication Sig  . amLODipine (NORVASC) 10 MG tablet Take 1 tablet (10 mg total) by mouth daily.  Marland Kitchen aspirin EC 81  MG tablet Take 1 tablet (81 mg total) by mouth daily.  . cyclobenzaprine (FLEXERIL) 10 MG tablet TAKE 1 TABLET BY MOUTH THREE TIMES DAILY AS NEEDED FOR MUSCLE SPASMS  . felodipine (PLENDIL) 10 MG 24 hr tablet Take 1 tablet (10 mg total) by mouth daily.  . fenofibrate 160 MG tablet Take 1 tablet (160 mg total) by mouth daily.  . fluticasone (FLONASE) 50 MCG/ACT nasal spray Place 2 sprays into both nostrils daily. (Patient not taking: Reported on 06/11/2020)  . glucose blood test strip TrueTrack strips. Test BS qd. Dx E11.9  . hydrochlorothiazide (HYDRODIURIL) 25 MG tablet Take 1 tablet (25 mg total) by mouth daily.  Elmore Guise Devices (ACCU-CHEK SOFTCLIX) lancets Use as instructed  . losartan (COZAAR) 100 MG tablet Take 1 tablet (100 mg total) by mouth daily.  . metFORMIN (GLUCOPHAGE) 1000 MG tablet Take 1 tablet (1,000 mg total) by mouth 2 (two) times daily with a meal.  . oxyCODONE (ROXICODONE) 15 MG immediate release tablet TK 1 T PO Q 6 H  . rosuvastatin (CRESTOR) 10 MG tablet TAKE 1 TABLET(10 MG) BY MOUTH DAILY  . sitaGLIPtin (JANUVIA) 100 MG tablet Take 1 tablet (100 mg total) by mouth daily.   No facility-administered encounter medications on file as of 09/27/2020.    Past Surgical History:  Procedure Laterality Date  . HERNIA REPAIR      Family History  Problem  Relation Age of Onset  . Heart disease Mother   . Diabetes Mother   . Cancer Mother   . Heart disease Father   . Diabetes Son 15       type 1  . Diabetes Paternal Uncle   . Heart attack Brother   . Hypertension Son     New complaints: Follows pain management for chronic pain issues, no new complaints today.   Social history: Lives at home with wife, into making music, plays guitar.   Controlled substance contract: n/a   Review of Systems  Constitutional: Negative.   HENT: Negative.   Eyes: Negative.   Respiratory: Negative.   Cardiovascular: Negative.   Gastrointestinal: Negative.   Endocrine: Negative.     Genitourinary: Negative.   Musculoskeletal: Negative.   Skin: Negative.   Allergic/Immunologic: Negative.   Neurological: Negative.   Hematological: Negative.   Psychiatric/Behavioral: Negative.   All other systems reviewed and are negative.      Objective:   Physical Exam Vitals and nursing note reviewed.  Constitutional:      Appearance: Normal appearance.  HENT:     Head: Normocephalic and atraumatic.     Right Ear: Tympanic membrane, ear canal and external ear normal.     Left Ear: Tympanic membrane, ear canal and external ear normal.     Nose: Nose normal.     Mouth/Throat:     Mouth: Mucous membranes are moist.     Pharynx: Oropharynx is clear.  Eyes:     Extraocular Movements: Extraocular movements intact.     Conjunctiva/sclera: Conjunctivae normal.     Pupils: Pupils are equal, round, and reactive to light.  Cardiovascular:     Rate and Rhythm: Normal rate and regular rhythm.     Pulses: Normal pulses.     Heart sounds: Normal heart sounds.  Pulmonary:     Effort: Pulmonary effort is normal.     Breath sounds: Normal breath sounds.  Abdominal:     General: Abdomen is flat. Bowel sounds are normal.     Palpations: Abdomen is soft.  Musculoskeletal:        General: Normal range of motion.     Cervical back: Normal range of motion.  Skin:    General: Skin is warm and dry.     Capillary Refill: Capillary refill takes less than 2 seconds.  Neurological:     General: No focal deficit present.     Mental Status: He is alert and oriented to person, place, and time. Mental status is at baseline.  Psychiatric:        Mood and Affect: Mood normal.        Behavior: Behavior normal.        Thought Content: Thought content normal.        Judgment: Judgment normal.    BP 130/77   Pulse 67   Temp (!) 97.1 F (36.2 C) (Temporal)   Resp 20   Ht 5' 10"  (1.778 m)   Wt 198 lb (89.8 kg)   SpO2 98%   BMI 28.41 kg/m   A1C 6.4 today.     Assessment & Plan:  Michael Valenzuela comes in today with chief complaint of No chief complaint on file.   Diagnosis and orders addressed:  1. Essential hypertension, benign Check blood pressure regularly and take medication as prescribed. Eat a heart healthy diet and avoid foods that are high in salt.   - CBC with Differential/Platelet - CMP14+EGFR  2.  Hyperlipidemia with target LDL less than 100 Take medication as prescribed. Avoid foods with a high fat content such as fried or fatty foods.   - Lipid panel  3. Type 2 diabetes mellitus with other specified complication, without long-term current use of insulin (HCC) Take medication as prescribed and check blood glucose levels regularly. Avoid food with high carb and sugar content.   - Bayer DCA Hb A1c Waived - Microalbumin / creatinine urine ratio  4. Prostate cancer screening Report any changes in urination or difficulty emptying bladder.    5. BMI 30.0-30.9,adult Exercise regularly. Walking and swimming are great low-impact exercises that help lower blood pressure, cholesterol levels, and blood glucose levels.   Meds ordered this encounter  Medications  . hydrochlorothiazide (HYDRODIURIL) 25 MG tablet    Sig: Take 1 tablet (25 mg total) by mouth daily.    Dispense:  90 tablet    Refill:  1    Order Specific Question:   Supervising Provider    Answer:   Caryl Pina A [id number]  . losartan (COZAAR) 100 MG tablet    Sig: Take 1 tablet (100 mg total) by mouth daily.    Dispense:  90 tablet    Refill:  1    Order Specific Question:   Supervising Provider    Answer:   Caryl Pina A [id number]  . amLODipine (NORVASC) 10 MG tablet    Sig: Take 1 tablet (10 mg total) by mouth daily.    Dispense:  90 tablet    Refill:  1    Order Specific Question:   Supervising Provider    Answer:   Caryl Pina A [id number]  . fenofibrate 160 MG tablet    Sig: Take 1 tablet (160 mg total) by mouth daily.    Dispense:  90 tablet    Refill:  1    Order  Specific Question:   Supervising Provider    Answer:   Caryl Pina A [id number]  . rosuvastatin (CRESTOR) 10 MG tablet    Sig: TAKE 1 TABLET(10 MG) BY MOUTH DAILY    Dispense:  90 tablet    Refill:  1    Order Specific Question:   Supervising Provider    Answer:   Caryl Pina A [id number]  . sitaGLIPtin (JANUVIA) 100 MG tablet    Sig: Take 1 tablet (100 mg total) by mouth daily.    Dispense:  90 tablet    Refill:  1    Order Specific Question:   Supervising Provider    Answer:   Caryl Pina A [id number]  . metFORMIN (GLUCOPHAGE) 1000 MG tablet    Sig: Take 1 tablet (1,000 mg total) by mouth 2 (two) times daily with a meal.    Dispense:  180 tablet    Refill:  1    dose change    Order Specific Question:   Supervising Provider    Answer:   Caryl Pina A [[id number]]   Orders Placed This Encounter  Procedures  . Bayer DCA Hb A1c Waived  . CBC with Differential/Platelet  . CMP14+EGFR  . Lipid panel  . Microalbumin / creatinine urine ratio    Labs pending Health Maintenance reviewed Diet and exercise encouraged  Follow up plan: Follow up in 3 months.    [name] Done, FNP

## 2020-09-27 NOTE — Patient Instructions (Signed)
DASH Eating Plan DASH stands for "Dietary Approaches to Stop Hypertension." The DASH eating plan is a healthy eating plan that has been shown to reduce high blood pressure (hypertension). It may also reduce your risk for type 2 diabetes, heart disease, and stroke. The DASH eating plan may also help with weight loss. What are tips for following this plan?  General guidelines  Avoid eating more than 2,300 mg (milligrams) of salt (sodium) a day. If you have hypertension, you may need to reduce your sodium intake to 1,500 mg a day.  Limit alcohol intake to no more than 1 drink a day for nonpregnant women and 2 drinks a day for men. One drink equals 12 oz of beer, 5 oz of wine, or 1 oz of hard liquor.  Work with your health care provider to maintain a healthy body weight or to lose weight. Ask what an ideal weight is for you.  Get at least 30 minutes of exercise that causes your heart to beat faster (aerobic exercise) most days of the week. Activities may include walking, swimming, or biking.  Work with your health care provider or diet and nutrition specialist (dietitian) to adjust your eating plan to your individual calorie needs. Reading food labels   Check food labels for the amount of sodium per serving. Choose foods with less than 5 percent of the Daily Value of sodium. Generally, foods with less than 300 mg of sodium per serving fit into this eating plan.  To find whole grains, look for the word "whole" as the first word in the ingredient list. Shopping  Buy products labeled as "low-sodium" or "no salt added."  Buy fresh foods. Avoid canned foods and premade or frozen meals. Cooking  Avoid adding salt when cooking. Use salt-free seasonings or herbs instead of table salt or sea salt. Check with your health care provider or pharmacist before using salt substitutes.  Do not fry foods. Cook foods using healthy methods such as baking, boiling, grilling, and broiling instead.  Cook with  heart-healthy oils, such as olive, canola, soybean, or sunflower oil. Meal planning  Eat a balanced diet that includes: ? 5 or more servings of fruits and vegetables each day. At each meal, try to fill half of your plate with fruits and vegetables. ? Up to 6-8 servings of whole grains each day. ? Less than 6 oz of lean meat, poultry, or fish each day. A 3-oz serving of meat is about the same size as a deck of cards. One egg equals 1 oz. ? 2 servings of low-fat dairy each day. ? A serving of nuts, seeds, or beans 5 times each week. ? Heart-healthy fats. Healthy fats called Omega-3 fatty acids are found in foods such as flaxseeds and coldwater fish, like sardines, salmon, and mackerel.  Limit how much you eat of the following: ? Canned or prepackaged foods. ? Food that is high in trans fat, such as fried foods. ? Food that is high in saturated fat, such as fatty meat. ? Sweets, desserts, sugary drinks, and other foods with added sugar. ? Full-fat dairy products.  Do not salt foods before eating.  Try to eat at least 2 vegetarian meals each week.  Eat more home-cooked food and less restaurant, buffet, and fast food.  When eating at a restaurant, ask that your food be prepared with less salt or no salt, if possible. What foods are recommended? The items listed may not be a complete list. Talk with your dietitian about   what dietary choices are best for you. Grains Whole-grain or whole-wheat bread. Whole-grain or whole-wheat pasta. Brown rice. Oatmeal. Quinoa. Bulgur. Whole-grain and low-sodium cereals. Pita bread. Low-fat, low-sodium crackers. Whole-wheat flour tortillas. Vegetables Fresh or frozen vegetables (raw, steamed, roasted, or grilled). Low-sodium or reduced-sodium tomato and vegetable juice. Low-sodium or reduced-sodium tomato sauce and tomato paste. Low-sodium or reduced-sodium canned vegetables. Fruits All fresh, dried, or frozen fruit. Canned fruit in natural juice (without  added sugar). Meat and other protein foods Skinless chicken or turkey. Ground chicken or turkey. Pork with fat trimmed off. Fish and seafood. Egg whites. Dried beans, peas, or lentils. Unsalted nuts, nut butters, and seeds. Unsalted canned beans. Lean cuts of beef with fat trimmed off. Low-sodium, lean deli meat. Dairy Low-fat (1%) or fat-free (skim) milk. Fat-free, low-fat, or reduced-fat cheeses. Nonfat, low-sodium ricotta or cottage cheese. Low-fat or nonfat yogurt. Low-fat, low-sodium cheese. Fats and oils Soft margarine without trans fats. Vegetable oil. Low-fat, reduced-fat, or light mayonnaise and salad dressings (reduced-sodium). Canola, safflower, olive, soybean, and sunflower oils. Avocado. Seasoning and other foods Herbs. Spices. Seasoning mixes without salt. Unsalted popcorn and pretzels. Fat-free sweets. What foods are not recommended? The items listed may not be a complete list. Talk with your dietitian about what dietary choices are best for you. Grains Baked goods made with fat, such as croissants, muffins, or some breads. Dry pasta or rice meal packs. Vegetables Creamed or fried vegetables. Vegetables in a cheese sauce. Regular canned vegetables (not low-sodium or reduced-sodium). Regular canned tomato sauce and paste (not low-sodium or reduced-sodium). Regular tomato and vegetable juice (not low-sodium or reduced-sodium). Pickles. Olives. Fruits Canned fruit in a light or heavy syrup. Fried fruit. Fruit in cream or butter sauce. Meat and other protein foods Fatty cuts of meat. Ribs. Fried meat. Bacon. Sausage. Bologna and other processed lunch meats. Salami. Fatback. Hotdogs. Bratwurst. Salted nuts and seeds. Canned beans with added salt. Canned or smoked fish. Whole eggs or egg yolks. Chicken or turkey with skin. Dairy Whole or 2% milk, cream, and half-and-half. Whole or full-fat cream cheese. Whole-fat or sweetened yogurt. Full-fat cheese. Nondairy creamers. Whipped toppings.  Processed cheese and cheese spreads. Fats and oils Butter. Stick margarine. Lard. Shortening. Ghee. Bacon fat. Tropical oils, such as coconut, palm kernel, or palm oil. Seasoning and other foods Salted popcorn and pretzels. Onion salt, garlic salt, seasoned salt, table salt, and sea salt. Worcestershire sauce. Tartar sauce. Barbecue sauce. Teriyaki sauce. Soy sauce, including reduced-sodium. Steak sauce. Canned and packaged gravies. Fish sauce. Oyster sauce. Cocktail sauce. Horseradish that you find on the shelf. Ketchup. Mustard. Meat flavorings and tenderizers. Bouillon cubes. Hot sauce and Tabasco sauce. Premade or packaged marinades. Premade or packaged taco seasonings. Relishes. Regular salad dressings. Where to find more information:  National Heart, Lung, and Blood Institute: www.nhlbi.nih.gov  American Heart Association: www.heart.org Summary  The DASH eating plan is a healthy eating plan that has been shown to reduce high blood pressure (hypertension). It may also reduce your risk for type 2 diabetes, heart disease, and stroke.  With the DASH eating plan, you should limit salt (sodium) intake to 2,300 mg a day. If you have hypertension, you may need to reduce your sodium intake to 1,500 mg a day.  When on the DASH eating plan, aim to eat more fresh fruits and vegetables, whole grains, lean proteins, low-fat dairy, and heart-healthy fats.  Work with your health care provider or diet and nutrition specialist (dietitian) to adjust your eating plan to your   individual calorie needs. This information is not intended to replace advice given to you by your health care provider. Make sure you discuss any questions you have with your health care provider. Document Revised: 10/09/2017 Document Reviewed: 10/20/2016 Elsevier Patient Education  2020 [employer]  

## 2020-09-28 LAB — MICROALBUMIN / CREATININE URINE RATIO
Creatinine, Urine: 128.2 mg/dL
Microalb/Creat Ratio: 9 mg/g creat (ref 0–29)
Microalbumin, Urine: 11.6 ug/mL

## 2020-09-30 LAB — PSA, TOTAL AND FREE
PSA, Free Pct: 9.1 %
PSA, Free: 0.3 ng/mL
Prostate Specific Ag, Serum: 3.3 ng/mL (ref 0.0–4.0)

## 2020-09-30 LAB — SPECIMEN STATUS REPORT

## 2020-10-19 DIAGNOSIS — M545 Low back pain, unspecified: Secondary | ICD-10-CM | POA: Diagnosis not present

## 2020-10-19 DIAGNOSIS — M25511 Pain in right shoulder: Secondary | ICD-10-CM | POA: Diagnosis not present

## 2020-10-19 DIAGNOSIS — M542 Cervicalgia: Secondary | ICD-10-CM | POA: Diagnosis not present

## 2020-10-19 DIAGNOSIS — M25512 Pain in left shoulder: Secondary | ICD-10-CM | POA: Diagnosis not present

## 2020-10-19 DIAGNOSIS — G894 Chronic pain syndrome: Secondary | ICD-10-CM | POA: Diagnosis not present

## 2020-10-19 DIAGNOSIS — M79661 Pain in right lower leg: Secondary | ICD-10-CM | POA: Diagnosis not present

## 2020-10-19 DIAGNOSIS — M797 Fibromyalgia: Secondary | ICD-10-CM | POA: Diagnosis not present

## 2020-10-19 DIAGNOSIS — M79662 Pain in left lower leg: Secondary | ICD-10-CM | POA: Diagnosis not present

## 2020-10-29 ENCOUNTER — Other Ambulatory Visit: Payer: Self-pay | Admitting: Nurse Practitioner

## 2020-10-30 NOTE — Telephone Encounter (Signed)
Pt was last seen in November. Please review to refill.

## 2020-12-21 ENCOUNTER — Other Ambulatory Visit: Payer: Self-pay | Admitting: Nurse Practitioner

## 2020-12-21 DIAGNOSIS — E1169 Type 2 diabetes mellitus with other specified complication: Secondary | ICD-10-CM

## 2020-12-21 DIAGNOSIS — I10 Essential (primary) hypertension: Secondary | ICD-10-CM

## 2020-12-21 DIAGNOSIS — E785 Hyperlipidemia, unspecified: Secondary | ICD-10-CM

## 2020-12-28 ENCOUNTER — Encounter: Payer: Self-pay | Admitting: Nurse Practitioner

## 2020-12-28 ENCOUNTER — Ambulatory Visit (INDEPENDENT_AMBULATORY_CARE_PROVIDER_SITE_OTHER): Payer: Medicare Other | Admitting: Nurse Practitioner

## 2020-12-28 ENCOUNTER — Other Ambulatory Visit: Payer: Self-pay

## 2020-12-28 VITALS — BP 122/75 | HR 68 | Temp 97.0°F | Resp 20 | Ht 70.0 in | Wt 196.4 lb

## 2020-12-28 DIAGNOSIS — E785 Hyperlipidemia, unspecified: Secondary | ICD-10-CM

## 2020-12-28 DIAGNOSIS — E1169 Type 2 diabetes mellitus with other specified complication: Secondary | ICD-10-CM | POA: Diagnosis not present

## 2020-12-28 DIAGNOSIS — E559 Vitamin D deficiency, unspecified: Secondary | ICD-10-CM | POA: Diagnosis not present

## 2020-12-28 DIAGNOSIS — M797 Fibromyalgia: Secondary | ICD-10-CM

## 2020-12-28 DIAGNOSIS — G8929 Other chronic pain: Secondary | ICD-10-CM

## 2020-12-28 DIAGNOSIS — I10 Essential (primary) hypertension: Secondary | ICD-10-CM | POA: Diagnosis not present

## 2020-12-28 DIAGNOSIS — M5440 Lumbago with sciatica, unspecified side: Secondary | ICD-10-CM

## 2020-12-28 DIAGNOSIS — Z87891 Personal history of nicotine dependence: Secondary | ICD-10-CM

## 2020-12-28 DIAGNOSIS — Z6830 Body mass index (BMI) 30.0-30.9, adult: Secondary | ICD-10-CM

## 2020-12-28 LAB — BAYER DCA HB A1C WAIVED: HB A1C (BAYER DCA - WAIVED): 6.4 % (ref <7.0)

## 2020-12-28 MED ORDER — ROSUVASTATIN CALCIUM 10 MG PO TABS: 10.0000 mg | ORAL_TABLET | Freq: Every day | ORAL | 1 refills | Status: DC

## 2020-12-28 MED ORDER — FELODIPINE ER 10 MG PO TB24: ORAL_TABLET | ORAL | 1 refills | Status: DC

## 2020-12-28 MED ORDER — FENOFIBRATE 160 MG PO TABS
ORAL_TABLET | ORAL | 1 refills | Status: DC
Start: 2020-12-28 — End: 2021-03-15

## 2020-12-28 MED ORDER — HYDROCHLOROTHIAZIDE 25 MG PO TABS
ORAL_TABLET | ORAL | 1 refills | Status: DC
Start: 2020-12-28 — End: 2021-03-15

## 2020-12-28 MED ORDER — LOSARTAN POTASSIUM 100 MG PO TABS: ORAL_TABLET | ORAL | 1 refills | Status: DC

## 2020-12-28 MED ORDER — AMLODIPINE BESYLATE 10 MG PO TABS: ORAL_TABLET | ORAL | 1 refills | Status: DC

## 2020-12-28 MED ORDER — SITAGLIPTIN PHOSPHATE 100 MG PO TABS: ORAL_TABLET | ORAL | 1 refills | Status: DC

## 2020-12-28 MED ORDER — METFORMIN HCL 1000 MG PO TABS
ORAL_TABLET | ORAL | 1 refills | Status: DC
Start: 2020-12-28 — End: 2021-03-15

## 2020-12-28 NOTE — Progress Notes (Signed)
Subjective:    Patient ID: Michael Valenzuela, male    DOB: 10-16-1956, 65 y.o.   MRN: [id number]   Chief Complaint: Medical Management of Chronic Issues, Hypertension, Hyperlipidemia, and Diabetes (3 mo )    HPI:  1. Essential hypertension, benign No c/o chest pain, sob or headache. Does not check blood pressure at home. BP Readings from Last 3 Encounters:  09/27/20 130/77  06/11/20 127/75  10/18/19 128/75     2. Hyperlipidemia with target LDL less than 100 Does not watch diet and does no exercise because of chronic pain. Lab Results  Component Value Date   CHOL 129 09/27/2020   HDL 40 09/27/2020   LDLCALC 48 09/27/2020   TRIG 266 (H) 09/27/2020   CHOLHDL 3.2 09/27/2020    3. Type 2 diabetes mellitus with other specified complication, without long-term current use of insulin (HCC) Fasting blood sugars are running around 115*-130. He denies any low blood sugars. Tries to not eat alot of sweets. Lab Results  Component Value Date   HGBA1C 6.4 09/27/2020    4. Vitamin D deficiency Takes vitamind supplement daily  5. Fibromyalgia Has constant pain. Sees pain management  6. Chronic midline low back pain with sciatica, sciatica laterality unspecified Says that pain meds are not helping. He  Has discussed this with pain management.  7. Hx of tobacco use, presenting hazards to health Still smoking over a pack a day  8. BMI 30.0-30.9,adult No recent weight changes      Outpatient Encounter Medications as of 12/28/2020  Medication Sig  . cyclobenzaprine (FLEXERIL) 10 MG tablet TAKE 1 TABLET BY MOUTH THREE TIMES DAILY AS NEEDED FOR MUSCLE SPASMS  . amLODipine (NORVASC) 10 MG tablet TAKE 1 TABLET(10 MG) BY MOUTH DAILY  . aspirin EC 81 MG tablet Take 1 tablet (81 mg total) by mouth daily.  . felodipine (PLENDIL) 10 MG 24 hr tablet TAKE 1 TABLET(10 MG) BY MOUTH DAILY  . fenofibrate 160 MG tablet TAKE 1 TABLET(160 MG) BY MOUTH DAILY  . glucose blood test strip TrueTrack  strips. Test BS qd. Dx E11.9  . hydrochlorothiazide (HYDRODIURIL) 25 MG tablet TAKE 1 TABLET(25 MG) BY MOUTH DAILY  . JANUVIA 100 MG tablet TAKE 1 TABLET(100 MG) BY MOUTH DAILY  . Lancet Devices (ACCU-CHEK SOFTCLIX) lancets Use as instructed  . losartan (COZAAR) 100 MG tablet TAKE 1 TABLET(100 MG) BY MOUTH DAILY  . metFORMIN (GLUCOPHAGE) 1000 MG tablet TAKE 1 TABLET(1000 MG) BY MOUTH TWICE DAILY WITH A MEAL  . oxyCODONE (ROXICODONE) 15 MG immediate release tablet TK 1 T PO Q 6 H  . rosuvastatin (CRESTOR) 10 MG tablet TAKE 1 TABLET(10 MG) BY MOUTH DAILY   No facility-administered encounter medications on file as of 12/28/2020.    Past Surgical History:  Procedure Laterality Date  . HERNIA REPAIR      Family History  Problem Relation Age of Onset  . Heart disease Mother   . Diabetes Mother   . Cancer Mother   . Heart disease Father   . Diabetes Son 15       type 1  . Diabetes Paternal Uncle   . Heart attack Brother   . Hypertension Son     New complaints: None today  Social history: Lives with is wife  Controlled substance contract: n/a    Review of Systems  Constitutional: Negative for diaphoresis.  Eyes: Negative for pain.  Respiratory: Negative for shortness of breath.   Cardiovascular: Negative for chest pain, palpitations and  leg swelling.  Gastrointestinal: Negative for abdominal pain.  Endocrine: Negative for polydipsia.  Musculoskeletal: Positive for arthralgias, back pain, myalgias and neck pain.  Skin: Negative for rash.  Neurological: Negative for dizziness, weakness and headaches.  Hematological: Does not bruise/bleed easily.  All other systems reviewed and are negative.      Objective:   Physical Exam Vitals and nursing note reviewed.  Constitutional:      Appearance: Normal appearance. He is well-developed and well-nourished.  HENT:     Head: Normocephalic.     Nose: Nose normal.     Mouth/Throat:     Mouth: Oropharynx is clear and moist.   Eyes:     Extraocular Movements: EOM normal.     Pupils: Pupils are equal, round, and reactive to light.  Neck:     Thyroid: No thyroid mass or thyromegaly.     Vascular: No carotid bruit or JVD.     Trachea: Phonation normal.  Cardiovascular:     Rate and Rhythm: Normal rate and regular rhythm.     Heart sounds: Murmur (2/6 heard loudets at mitral valve) heard.    Pulmonary:     Effort: Pulmonary effort is normal. No respiratory distress.     Breath sounds: Normal breath sounds.  Abdominal:     General: Bowel sounds are normal. Aorta is normal.     Palpations: Abdomen is soft.     Tenderness: There is no abdominal tenderness.  Musculoskeletal:        General: Normal range of motion.     Cervical back: Normal range of motion and neck supple.     Comments: Walks slow an dlimps on left and sometimes on right. Does not straighten all the way when walking C/O pain with any movement of lower back (+) SLR bil at 45 degrees.  Lymphadenopathy:     Cervical: No cervical adenopathy.  Skin:    General: Skin is warm and dry.  Neurological:     Mental Status: He is alert and oriented to person, place, and time.  Psychiatric:        Mood and Affect: Mood and affect normal.        Behavior: Behavior normal.        Thought Content: Thought content normal.        Judgment: Judgment normal.     BP 122/75   Pulse 68   Temp (!) 97 F (36.1 C) (Temporal)   Resp 20   Ht _0  (1.778 m)   Wt 196 lb 6 oz (89.1 kg)   SpO2 96%   BMI 28.18 kg/m   hgba1c 6.4%     Assessment & Plan:  Michael Valenzuela comes in today with chief complaint of Medical Management of Chronic Issues, Hypertension, Hyperlipidemia, and Diabetes (3 mo )   Diagnosis and orders addressed:  1. Essential hypertension, benign Low sodium diet - CBC with Differential/Platelet - CMP14+EGFR - losartan (COZAAR) 100 MG tablet; TAKE 1 TABLET(100 MG) BY MOUTH DAILY  Dispense: 90 tablet; Refill: 1 - hydrochlorothiazide  (HYDRODIURIL) 25 MG tablet; TAKE 1 TABLET(25 MG) BY MOUTH DAILY  Dispense: 90 tablet; Refill: 1 - amLODipine (NORVASC) 10 MG tablet; TAKE 1 TABLET(10 MG) BY MOUTH DAILY  Dispense: 90 tablet; Refill: 1 - felodipine (PLENDIL) 10 MG 24 hr tablet; TAKE 1 TABLET(10 MG) BY MOUTH DAILY  Dispense: 90 tablet; Refill: 1  2. Hyperlipidemia with target LDL less than 100 Low fta diet - Lipid panel - CBC with Differential/Platelet -  rosuvastatin (CRESTOR) 10 MG tablet; Take 1 tablet (10 mg total) by mouth daily.  Dispense: 90 tablet; Refill: 1 - fenofibrate 160 MG tablet; TAKE 1 TABLET(160 MG) BY MOUTH DAILY  Dispense: 90 tablet; Refill: 1  3. Type 2 diabetes mellitus with other specified complication, without long-term current use of insulin (HCC) Continue to wtrach carbs in diet - Bayer DCA Hb A1c Waived - CBC with Differential/Platelet - metFORMIN (GLUCOPHAGE) 1000 MG tablet; TAKE 1 TABLET(1000 MG) BY MOUTH TWICE DAILY WITH A MEAL  Dispense: 180 tablet; Refill: 1 - sitaGLIPtin (JANUVIA) 100 MG tablet; TAKE 1 TABLET(100 MG) BY MOUTH DAILY  Dispense: 90 tablet; Refill: 1  4. Vitamin D deficiency Continue daily vitamin d suppleemnt  5. Fibromyalgia Keep followup with painmanagement  6. Chronic midline low back pain with sciatica, sciatica laterality unspecified Keep follow up with painmanagement  7. Hx of tobacco use, presenting hazards to health Smoking cessation encouraged  8. BMI 30.0-30.9,adult Discussed diet and exercise for person with BMI >25 Will recheck weight in 3-6 months   Labs pending Health Maintenance reviewed Diet and exercise encouraged  Follow up plan: 3 months/   Nixon, FNP

## 2020-12-28 NOTE — Patient Instructions (Signed)
Managing the Challenge of Quitting Smoking Quitting smoking is a physical and mental challenge. You will face cravings, withdrawal symptoms, and temptation. Before quitting, work with your health care provider to make a plan that can help you manage quitting. Preparation can help you quit and keep you from giving in. How to manage lifestyle changes Managing stress Stress can make you want to smoke, and wanting to smoke may cause stress. It is important to find ways to manage your stress. You might try some of the following:  Practice relaxation techniques. ? Breathe slowly and deeply, in through your nose and out through your mouth. ? Listen to music. ? Soak in a bath or take a shower. ? Imagine a peaceful place or vacation.  Get some support. ? Talk with family or friends about your stress. ? Join a support group. ? Talk with a counselor or therapist.  Get some physical activity. ? Go for a walk, run, or bike ride. ? Play a favorite sport. ? Practice yoga.   Medicines Talk with your health care provider about medicines that might help you deal with cravings and make quitting easier for you. Relationships Social situations can be difficult when you are quitting smoking. To manage this, you can:  Avoid parties and other social situations where people might be smoking.  Avoid alcohol.  Leave right away if you have the urge to smoke.  Explain to your family and friends that you are quitting smoking. Ask for support and let them know you might be a bit grumpy.  Plan activities where smoking is not an option. General instructions Be aware that many people gain weight after they quit smoking. However, not everyone does. To keep from gaining weight, have a plan in place before you quit and stick to the plan after you quit. Your plan should include:  Having healthy snacks. When you have a craving, it may help to: ? Eat popcorn, carrots, celery, or other cut vegetables. ? Chew  sugar-free gum.  Changing how you eat. ? Eat small portion sizes at meals. ? Eat 4-6 small meals throughout the day instead of 1-2 large meals a day. ? Be mindful when you eat. Do not watch television or do other things that might distract you as you eat.  Exercising regularly. ? Make time to exercise each day. If you do not have time for a long workout, do short bouts of exercise for 5-10 minutes several times a day. ? Do some form of strengthening exercise, such as weight lifting. ? Do some exercise that gets your heart beating and causes you to breathe deeply, such as walking fast, running, swimming, or biking. This is very important.  Drinking plenty of water or other low-calorie or no-calorie drinks. Drink 6-8 glasses of water daily.   How to recognize withdrawal symptoms Your body and mind may experience discomfort as you try to get used to not having nicotine in your system. These effects are called withdrawal symptoms. They may include:  Feeling hungrier than normal.  Having trouble concentrating.  Feeling irritable or restless.  Having trouble sleeping.  Feeling depressed.  Craving a cigarette. To manage withdrawal symptoms:  Avoid places, people, and activities that trigger your cravings.  Remember why you want to quit.  Get plenty of sleep.  Avoid coffee and other caffeinated drinks. These may worsen some of your symptoms. These symptoms may surprise you. But be assured that they are normal to have when quitting smoking. How to manage cravings   Come up with a plan for how to deal with your cravings. The plan should include the following:  A definition of the specific situation you want to deal with.  An alternative action you will take.  A clear idea for how this action will help.  The name of someone who might help you with this. Cravings usually last for 5-10 minutes. Consider taking the following actions to help you with your plan to deal with  cravings:  Keep your mouth busy. ? Chew sugar-free gum. ? Suck on hard candies or a straw. ? Brush your teeth.  Keep your hands and body busy. ? Change to a different activity right away. ? Squeeze or play with a ball. ? Do an activity or a hobby, such as making bead jewelry, practicing needlepoint, or working with wood. ? Mix up your normal routine. ? Take a short exercise break. Go for a quick walk or run up and down stairs.  Focus on doing something kind or helpful for someone else.  Call a friend or family member to talk during a craving.  Join a support group.  Contact a quitline. Where to find support To get help or find a support group:  Call the National Cancer Institute's Smoking Quitline: 1-800-QUIT NOW (784-8669)  Visit the website of the Substance Abuse and Mental Health Services Administration: www.samhsa.gov  Text QUIT to SmokefreeTXT: 478848 Where to find more information Visit these websites to find more information on quitting smoking:  National Cancer Institute: www.smokefree.gov  American Lung Association: www.lung.org  American Cancer Society: www.cancer.org  Centers for Disease Control and Prevention: www.cdc.gov  American Heart Association: www.heart.org Contact a health care provider if:  You want to change your plan for quitting.  The medicines you are taking are not helping.  Your eating feels out of control or you cannot sleep. Get help right away if:  You feel depressed or become very anxious. Summary  Quitting smoking is a physical and mental challenge. You will face cravings, withdrawal symptoms, and temptation to smoke again. Preparation can help you as you go through these challenges.  Try different techniques to manage stress, handle social situations, and prevent weight gain.  You can deal with cravings by keeping your mouth busy (such as by chewing gum), keeping your hands and body busy, calling family or friends, or  contacting a quitline for people who want to quit smoking.  You can deal with withdrawal symptoms by avoiding places where people smoke, getting plenty of rest, and avoiding drinks with caffeine. This information is not intended to replace advice given to you by your health care provider. Make sure you discuss any questions you have with your health care provider. Document Revised: 08/16/2019 Document Reviewed: 08/16/2019 Elsevier Patient Education  2021 [employer]  

## 2020-12-29 LAB — CMP14+EGFR
ALT: 35 IU/L (ref 0–44)
AST: 38 IU/L (ref 0–40)
Albumin/Globulin Ratio: 2.2 (ref 1.2–2.2)
Albumin: 5.2 g/dL — ABNORMAL HIGH (ref 3.8–4.8)
Alkaline Phosphatase: 59 IU/L (ref 44–121)
BUN/Creatinine Ratio: 20 (ref 10–24)
BUN: 18 mg/dL (ref 8–27)
Bilirubin Total: 0.4 mg/dL (ref 0.0–1.2)
CO2: 21 mmol/L (ref 20–29)
Calcium: 10.4 mg/dL — ABNORMAL HIGH (ref 8.6–10.2)
Chloride: 100 mmol/L (ref 96–106)
Creatinine, Ser: 0.9 mg/dL (ref 0.76–1.27)
GFR calc Af Amer: 103 mL/min/{1.73_m2} (ref >59)
GFR calc non Af Amer: 89 mL/min/{1.73_m2} (ref >59)
Globulin, Total: 2.4 g/dL (ref 1.5–4.5)
Glucose: 138 mg/dL — ABNORMAL HIGH (ref 65–99)
Potassium: 4.9 mmol/L (ref 3.5–5.2)
Sodium: 139 mmol/L (ref 134–144)
Total Protein: 7.6 g/dL (ref 6.0–8.5)

## 2020-12-29 LAB — LIPID PANEL
Chol/HDL Ratio: 3.7 ratio (ref 0.0–5.0)
Cholesterol, Total: 153 mg/dL (ref 100–199)
HDL: 41 mg/dL (ref >39)
LDL Chol Calc (NIH): 70 mg/dL (ref 0–99)
Triglycerides: 259 mg/dL — ABNORMAL HIGH (ref 0–149)
VLDL Cholesterol Cal: 42 mg/dL — ABNORMAL HIGH (ref 5–40)

## 2020-12-29 LAB — CBC WITH DIFFERENTIAL/PLATELET
Basophils Absolute: 0.1 10*3/uL (ref 0.0–0.2)
Basos: 1 %
EOS (ABSOLUTE): 0.3 10*3/uL (ref 0.0–0.4)
Eos: 4 %
Hematocrit: 49.3 % (ref 37.5–51.0)
Hemoglobin: 16.7 g/dL (ref 13.0–17.7)
Immature Grans (Abs): 0 10*3/uL (ref 0.0–0.1)
Immature Granulocytes: 0 %
Lymphocytes Absolute: 2.3 10*3/uL (ref 0.7–3.1)
Lymphs: 32 %
MCH: 30.3 pg (ref 26.6–33.0)
MCHC: 33.9 g/dL (ref 31.5–35.7)
MCV: 90 fL (ref 79–97)
Monocytes Absolute: 0.5 10*3/uL (ref 0.1–0.9)
Monocytes: 7 %
Neutrophils Absolute: 4 10*3/uL (ref 1.4–7.0)
Neutrophils: 56 %
Platelets: 259 10*3/uL (ref 150–450)
RBC: 5.51 x10E6/uL (ref 4.14–5.80)
RDW: 12.6 % (ref 11.6–15.4)
WBC: 7.2 10*3/uL (ref 3.4–10.8)

## 2021-01-15 ENCOUNTER — Other Ambulatory Visit: Payer: Self-pay | Admitting: Nurse Practitioner

## 2021-02-11 ENCOUNTER — Ambulatory Visit (INDEPENDENT_AMBULATORY_CARE_PROVIDER_SITE_OTHER): Payer: Medicare Other

## 2021-02-11 DIAGNOSIS — Z0000 Encounter for general adult medical examination without abnormal findings: Secondary | ICD-10-CM

## 2021-02-11 NOTE — Progress Notes (Signed)
Subjective:   Lelynd Poer is a 65 y.o. male who presents for Medicare Annual/Subsequent preventive examination.  Virtual Visit via Telephone Note  I connected with  Charod Slawinski on 02/11/21 at  9:00 AM EDT by telephone and verified that I am speaking with the correct person using two identifiers.  Location: Patient: Home Provider: [facility] Persons participating in the virtual visit: patient/Nurse Health Advisor   I discussed the limitations, risks, security and privacy concerns of performing an evaluation and management service by telephone and the availability of in person appointments. The patient expressed understanding and agreed to proceed.  Interactive audio and video telecommunications were attempted between this nurse and patient, however failed, due to patient having technical difficulties OR patient did not have access to video capability.  We continued and completed visit with audio only.  Some vital signs may be absent or patient reported.   Nykeem Citro E Beatrix Breece, LPN   Review of Systems     Cardiac Risk Factors include: advanced age (>59men, >38 women);diabetes mellitus;dyslipidemia;sedentary lifestyle;smoking/ tobacco exposure;hypertension;male gender     Objective:    Today's Vitals   02/11/21 0842  PainSc: 7    There is no height or weight on file to calculate BMI.  Advanced Directives 02/09/2020 01/24/2019 12/31/2017 09/11/2014  Does Patient Have a Medical Advance Directive? No No No No  Would patient like information on creating a medical advance directive? Yes (MAU/Ambulatory/Procedural Areas - Information given) Yes (MAU/Ambulatory/Procedural Areas - Information given) No - Patient declined Yes - Educational materials given    Current Medications (verified) Outpatient Encounter Medications as of 02/11/2021  Medication Sig  . amLODipine (NORVASC) 10 MG tablet TAKE 1 TABLET(10 MG) BY MOUTH DAILY  . aspirin EC 81 MG tablet Take 1 tablet (81 mg total) by mouth daily.  .  cyclobenzaprine (FLEXERIL) 10 MG tablet TAKE 1 TABLET BY MOUTH THREE TIMES DAILY AS NEEDED FOR MUSCLE SPASMS  . felodipine (PLENDIL) 10 MG 24 hr tablet TAKE 1 TABLET(10 MG) BY MOUTH DAILY  . fenofibrate 160 MG tablet TAKE 1 TABLET(160 MG) BY MOUTH DAILY  . glucose blood test strip TrueTrack strips. Test BS qd. Dx E11.9  . hydrochlorothiazide (HYDRODIURIL) 25 MG tablet TAKE 1 TABLET(25 MG) BY MOUTH DAILY  . Lancet Devices (ACCU-CHEK SOFTCLIX) lancets Use as instructed  . losartan (COZAAR) 100 MG tablet TAKE 1 TABLET(100 MG) BY MOUTH DAILY  . metFORMIN (GLUCOPHAGE) 1000 MG tablet TAKE 1 TABLET(1000 MG) BY MOUTH TWICE DAILY WITH A MEAL  . oxyCODONE (ROXICODONE) 15 MG immediate release tablet TK 1 T PO Q 6 H  . rosuvastatin (CRESTOR) 10 MG tablet Take 1 tablet (10 mg total) by mouth daily.  . sitaGLIPtin (JANUVIA) 100 MG tablet TAKE 1 TABLET(100 MG) BY MOUTH DAILY   No facility-administered encounter medications on file as of 02/11/2021.    Allergies (verified) Patient has no known allergies.   History: Past Medical History:  Diagnosis Date  . Chronic low back pain   . Diabetes mellitus without complication ([place])   . Fibromyalgia   . Hyperlipidemia   . Hypertension   . Vitamin D deficiency    Past Surgical History:  Procedure Laterality Date  . HERNIA REPAIR     Family History  Problem Relation Age of Onset  . Heart disease Mother   . Diabetes Mother   . Cancer Mother   . Heart disease Father   . Diabetes Son 15       type 1  . Diabetes  Paternal Uncle   . Heart attack Brother   . Hypertension Son    Social History   Socioeconomic History  . Marital status: Married    Spouse name: [name]  . Number of children: 2  . Years of education: 41  . Highest education level: Some college, no degree  Occupational History  . Occupation: Retired    Fish farm manager: [employer]  . Occupation: disabled  Tobacco Use  . Smoking status: Current Every Day Smoker    Packs/day: 0.50     Years: 20.00    Pack years: 10.00    Types: Cigarettes  . Smokeless tobacco: Never Used  Vaping Use  . Vaping Use: Never used  Substance and Sexual Activity  . Alcohol use: Yes    Comment: occasional  . Drug use: No  . Sexual activity: Not Currently  Other Topics Concern  . Not on file  Social History Narrative   Patient has fibromyalgia and has significant pain with that. He has two adult children and is married. He has one grandchild.    Social Determinants of Health   Financial Resource Strain: Low Risk   . Difficulty of Paying Living Expenses: Not hard at all  Food Insecurity: No Food Insecurity  . Worried About Charity fundraiser in the Last Year: Never true  . Ran Out of Food in the Last Year: Never true  Transportation Needs: No Transportation Needs  . Lack of Transportation (Medical): No  . Lack of Transportation (Non-Medical): No  Physical Activity: Sufficiently Active  . Days of Exercise per Week: 7 days  . Minutes of Exercise per Session: 30 min  Stress: No Stress Concern Present  . Feeling of Stress : Not at all  Social Connections: Moderately Isolated  . Frequency of Communication with Friends and Family: Once a week  . Frequency of Social Gatherings with Friends and Family: More than three times a week  . Attends Religious Services: Never  . Active Member of Clubs or Organizations: No  . Attends Archivist Meetings: Never  . Marital Status: Married    Tobacco Counseling Ready to quit: Not Answered Counseling given: Not Answered   Clinical Intake:  Pre-visit preparation completed: Yes  Pain : 0-10 Pain Score: 7  Pain Type: Chronic pain Pain Location: Generalized (fibromyalgia, neck and back pain - takes oxycodone) Pain Descriptors / Indicators: Aching,Constant,Spasm,Sharp Pain Onset: More than a month ago Pain Frequency: Constant     BMI - recorded: 28.18 Nutritional Status: BMI 25 -29 Overweight Nutritional Risks: None Diabetes:  Yes CBG done?: No Did pt. bring in CBG monitor from home?: No  How often do you need to have someone help you when you read instructions, pamphlets, or other written materials from your doctor or pharmacy?: 1 - Never  Nutrition Risk Assessment:  Has the patient had any N/V/D within the last 2 months?  No  Does the patient have any non-healing wounds?  No  Has the patient had any unintentional weight loss or weight gain?  No   Diabetes:  Is the patient diabetic?  Yes  If diabetic, was a CBG obtained today?  No  Did the patient bring in their glucometer from home?  No  How often do you monitor your CBG's? Fasting daily - 121 today at home per patient.   Financial Strains and Diabetes Management:  Are you having any financial strains with the device, your supplies or your medication? No .  Does the patient want to  be seen by Chronic Care Management for management of their diabetes?  No  Would the patient like to be referred to a Nutritionist or for Diabetic Management?  No   Diabetic Exams:  Diabetic Eye Exam: Completed 12/25/2020. Overdue for diabetic eye exam. Pt has been advised about the importance in completing this exam. A referral has been placed today. Message sent to referral coordinator for scheduling purposes. Advised pt to expect a call from office referred to regarding appt.  Diabetic Foot Exam: Completed 12/28/2020. Pt has been advised about the importance in completing this exam. Pt is scheduled for diabetic foot exam on 12/28/2021.    Interpreter Needed?: No  Information entered by :: Maika Mcelveen, LPN   Activities of Daily Living In your present state of health, do you have any difficulty performing the following activities: 02/11/2021  Hearing? N  Vision? N  Difficulty concentrating or making decisions? N  Walking or climbing stairs? N  Dressing or bathing? N  Doing errands, shopping? N  Preparing Food and eating ? N  Using the Toilet? N  In the past six  months, have you accidently leaked urine? N  Do you have problems with loss of bowel control? N  Managing your Medications? N  Managing your Finances? N  Housekeeping or managing your Housekeeping? N  Some recent data might be hidden    Patient Care Team: Chevis Pretty, FNP as PCP - General (Nurse Practitioner) [facility] (Pain Medicine)  Indicate any recent Medical Services you may have received from other than [facility] providers in the past year (date may be approximate).     Assessment:   This is a routine wellness examination for Fuad.  Hearing/Vision screen No exam data present  Dietary issues and exercise activities discussed: Current Exercise Habits: The patient does not participate in regular exercise at present, Exercise limited by: orthopedic condition(s);Other - see comments (Fibromyalgia)  Goals    . Exercise 150 min/wk Moderate Activity     02/09/2020 AWV Goal: Exercise for General Health   Patient will verbalize understanding of the benefits of increased physical activity:  Exercising regularly is important. It will improve your overall fitness, flexibility, and endurance.  Regular exercise also will improve your overall health. It can help you control your weight, reduce stress, and improve your bone density.  Over the next year, patient will increase physical activity as tolerated with a goal of at least 150 minutes of moderate physical activity per week.   You can tell that you are exercising at a moderate intensity if your heart starts beating faster and you start breathing faster but can still hold a conversation.  Moderate-intensity exercise ideas include:  Walking 1 mile (1.6 km) in about 15 minutes  Biking  Hiking  Golfing  Dancing  Water aerobics  Patient will verbalize understanding of everyday activities that increase physical activity by providing examples like the following: ? Yard work, such as: ? Pushing a Cabin crew ? Raking and bagging leaves ? Washing your car ? Pushing a stroller ? Shoveling snow ? Gardening ? Washing windows or floors  Patient will be able to explain general safety guidelines for exercising:   Before you start a new exercise program, talk with your health care provider.  Do not exercise so much that you hurt yourself, feel dizzy, or get very short of breath.  Wear comfortable clothes and wear shoes with good support.  Drink plenty of water while you exercise to prevent dehydration  or heat stroke.  Work out until your breathing and your heartbeat get faster.     . Patient Stated     Get control of pain      Depression Screen PHQ 2/9 Scores 02/11/2021 12/28/2020 09/27/2020 06/11/2020 03/13/2020 02/09/2020 10/18/2019  PHQ - 2 Score 3 0 0 0 0 0 0  PHQ- 9 Score 9 - - - - - -  Exception Documentation - - - - - - -    Fall Risk Fall Risk  12/28/2020 09/27/2020 06/11/2020 03/13/2020 02/09/2020  Falls in the past year? 0 0 0 0 0  Number falls in past yr: - - - - 0  Injury with Fall? - - - - 0  Risk for fall due to : - - - - No Fall Risks  Follow up - - - - Falls evaluation completed    FALL RISK PREVENTION PERTAINING TO THE HOME:  Any stairs in or around the home? Yes  If so, are there any without handrails? No  Home free of loose throw rugs in walkways, pet beds, electrical cords, etc? Yes  Adequate lighting in your home to reduce risk of falls? Yes   ASSISTIVE DEVICES UTILIZED TO PREVENT FALLS:  Life alert? No  Use of a cane, walker or w/c? No  Grab bars in the bathroom? Yes  Shower chair or bench in shower? No  Elevated toilet seat or a handicapped toilet? Yes   TIMED UP AND GO:  Was the test performed? No . Telephonic visit.  Cognitive Function: MMSE - Mini Mental State Exam 01/24/2019 12/31/2017  Orientation to time 5 5  Orientation to Place 5 5  Registration 3 3  Attention/ Calculation 4 4  Recall 2 2  Language- name 2 objects 2 2  Language- repeat 1 1   Language- follow 3 step command 3 3  Language- read & follow direction 1 1  Write a sentence 1 1  Copy design 1 0  Total score 28 27     6CIT Screen 02/09/2020  What Year? 0 points  What month? 0 points  What time? 0 points  Count back from 20 0 points  Months in reverse 0 points  Repeat phrase 0 points  Total Score 0    Immunizations Immunization History  Administered Date(s) Administered  . Fluad Quad(high Dose 65+) 09/27/2020  . Influenza,inj,Quad PF,6+ Mos 10/06/2016, 10/08/2017, 10/11/2018, 07/15/2019  . PFIZER(Purple Top)SARS-COV-2 Vaccination 02/23/2020, 03/15/2020, 11/09/2020  . Pneumococcal Conjugate-13 12/31/2015  . Pneumococcal Polysaccharide-23 01/06/2017  . Tdap 03/19/2015  . Tetanus 06/25/2004  . Zoster 09/25/2015    TDAP status: Up to date  Flu Vaccine status: Up to date  Pneumococcal vaccine status: Up to date  Covid-19 vaccine status: Completed vaccines  Qualifies for Shingles Vaccine? Yes   Zostavax completed Yes   Shingrix Completed?: No.    Education has been provided regarding the importance of this vaccine. Patient has been advised to call insurance company to determine out of pocket expense if they have not yet received this vaccine. Advised may also receive vaccine at local pharmacy or Health Dept. Verbalized acceptance and understanding.  Screening Tests Health Maintenance  Topic Date Due  . HIV Screening  12/28/2021 (Originally 06/19/1971)  . INFLUENZA VACCINE  06/10/2021  . HEMOGLOBIN A1C  06/27/2021  . OPHTHALMOLOGY EXAM  12/25/2021  . FOOT EXAM  12/28/2021  . PNA vac Low Risk Adult (2 of 2 - PPSV23) 01/06/2022  . COLONOSCOPY (Pts 45-71yrs Insurance coverage will need  to be confirmed)  03/17/2022  . TETANUS/TDAP  03/18/2025  . COVID-19 Vaccine  Completed  . Hepatitis C Screening  Completed  . HPV VACCINES  Aged Out    Health Maintenance  There are no preventive care reminders to display for this patient.  Colorectal cancer  screening: Type of screening: Colonoscopy. Completed 03/17/2012. Repeat every 10 years  Lung Cancer Screening: (Low Dose CT Chest recommended if Age 46-80 years, 30 pack-year currently smoking OR have quit w/in 15years.) does not qualify.    Additional Screening:  Hepatitis C Screening: does qualify; Completed 06/21/2015  Vision Screening: Recommended annual ophthalmology exams for early detection of glaucoma and other disorders of the eye. Is the patient up to date with their annual eye exam?  Yes  Who is the provider or what is the name of the office in which the patient attends annual eye exams? Dr Lanny Cramp at [facility] If pt is not established with a provider, would they like to be referred to a provider to establish care? No .   Dental Screening: Recommended annual dental exams for proper oral hygiene  Community Resource Referral / Chronic Care Management: CRR required this visit?  No   CCM required this visit?  No      Plan:     I have personally reviewed and noted the following in the patient's chart:   . Medical and social history . Use of alcohol, tobacco or illicit drugs  . Current medications and supplements . Functional ability and status . Nutritional status . Physical activity . Advanced directives . List of other physicians . Hospitalizations, surgeries, and ER visits in previous 12 months . Vitals . Screenings to include cognitive, depression, and falls . Referrals and appointments  In addition, I have reviewed and discussed with patient certain preventive protocols, quality metrics, and best practice recommendations. A written personalized care plan for preventive services as well as general preventive health recommendations were provided to patient.     Sandrea Hammond, LPN   12/17/8339   Nurse Notes: None

## 2021-02-11 NOTE — Patient Instructions (Signed)
[name] , Thank you for taking time to come for your Medicare Wellness Visit. I appreciate your ongoing commitment to your health goals. Please review the following plan we discussed and let me know if I can assist you in the future.   Screening recommendations/referrals: Colonoscopy:  Done 03/17/2012 - Repeat 10 years Recommended yearly ophthalmology/optometry visit for glaucoma screening and checkup Recommended yearly dental visit for hygiene and checkup  Vaccinations: Influenza vaccine: Done 09/27/2020 - Repeat annually Pneumococcal vaccine: Complete: 12/31/2015 & 01/06/2017 Tdap vaccine: Done 03/19/2015 - Repeat 10 years Shingles vaccine: Zostavax done 2016; Shingrix discussed. Please contact your pharmacy for coverage information.    Covid-19: Complete: 02/23/20, 03/15/20 & 11/09/20  Advanced directives: Please bring a copy of your health care power of attorney and living will to the office to be added to your chart at your convenience.  Conditions/risks identified: If you wish to quit smoking, help is available. For free tobacco cessation program offerings call the [facility] at (276)093-6781 or Live Well Line at 726-480-6936. You may also visit www.[facility].com or email [email or web address] for more information on other programs.   You may also call 1-800-QUIT-NOW 262-200-4990) or visit www.[facility].dk or www.Become[facility]Ex.org for additional resources on smoking cessation.    Next appointment: Follow up in one year for your annual wellness visit.   Preventive Care 66 Years and Older, Male  Preventive care refers to lifestyle choices and visits with your health care provider that can promote health and wellness. What does preventive care include?  A yearly physical exam. This is also called an annual well check.  Dental exams once or twice a year.  Routine eye exams. Ask your health care provider how often you should have your eyes checked.  Personal  lifestyle choices, including:  Daily care of your teeth and gums.  Regular physical activity.  Eating a healthy diet.  Avoiding tobacco and drug use.  Limiting alcohol use.  Practicing safe sex.  Taking low doses of aspirin every day.  Taking vitamin and mineral supplements as recommended by your health care provider. What happens during an annual well check? The services and screenings done by your health care provider during your annual well check will depend on your age, overall health, lifestyle risk factors, and family history of disease. Counseling  Your health care provider may ask you questions about your:  Alcohol use.  Tobacco use.  Drug use.  Emotional well-being.  Home and relationship well-being.  Sexual activity.  Eating habits.  History of falls.  Memory and ability to understand (cognition).  Work and work Statistician. Screening  You may have the following tests or measurements:  Height, weight, and BMI.  Blood pressure.  Lipid and cholesterol levels. These may be checked every 5 years, or more frequently if you are over 41 years old.  Skin check.  Lung cancer screening. You may have this screening every year starting at age 38 if you have a 30-pack-year history of smoking and currently smoke or have quit within the past 15 years.  Fecal occult blood test (FOBT) of the stool. You may have this test every year starting at age 35.  Flexible sigmoidoscopy or colonoscopy. You may have a sigmoidoscopy every 5 years or a colonoscopy every 10 years starting at age 66.  Prostate cancer screening. Recommendations will vary depending on your family history and other risks.  Hepatitis C blood test.  Hepatitis B blood test.  Sexually transmitted disease (STD) testing.  Diabetes  screening. This is done by checking your blood sugar (glucose) after you have not eaten for a while (fasting). You may have this done every 1-3 years.  Abdominal aortic  aneurysm (AAA) screening. You may need this if you are a current or former smoker.  Osteoporosis. You may be screened starting at age 27 if you are at high risk. Talk with your health care provider about your test results, treatment options, and if necessary, the need for more tests. Vaccines  Your health care provider may recommend certain vaccines, such as:  Influenza vaccine. This is recommended every year.  Tetanus, diphtheria, and acellular pertussis (Tdap, Td) vaccine. You may need a Td booster every 10 years.  Zoster vaccine. You may need this after age 18.  Pneumococcal 13-valent conjugate (PCV13) vaccine. One dose is recommended after age 55.  Pneumococcal polysaccharide (PPSV23) vaccine. One dose is recommended after age 86. Talk to your health care provider about which screenings and vaccines you need and how often you need them. This information is not intended to replace advice given to you by your health care provider. Make sure you discuss any questions you have with your health care provider. Document Released: 11/23/2015 Document Revised: 07/16/2016 Document Reviewed: 08/28/2015 Elsevier Interactive Patient Education  2017 [place] Prevention in the Home Falls can cause injuries. They can happen to people of all ages. There are many things you can do to make your home safe and to help prevent falls. What can I do on the outside of my home?  Regularly fix the edges of walkways and driveways and fix any cracks.  Remove anything that might make you trip as you walk through a door, such as a raised step or threshold.  Trim any bushes or trees on the path to your home.  Use bright outdoor lighting.  Clear any walking paths of anything that might make someone trip, such as rocks or tools.  Regularly check to see if handrails are loose or broken. Make sure that both sides of any steps have handrails.  Any raised decks and porches should have guardrails on  the edges.  Have any leaves, snow, or ice cleared regularly.  Use sand or salt on walking paths during winter.  Clean up any spills in your garage right away. This includes oil or grease spills. What can I do in the bathroom?  Use night lights.  Install grab bars by the toilet and in the tub and shower. Do not use towel bars as grab bars.  Use non-skid mats or decals in the tub or shower.  If you need to sit down in the shower, use a plastic, non-slip stool.  Keep the floor dry. Clean up any water that spills on the floor as soon as it happens.  Remove soap buildup in the tub or shower regularly.  Attach bath mats securely with double-sided non-slip rug tape.  Do not have throw rugs and other things on the floor that can make you trip. What can I do in the bedroom?  Use night lights.  Make sure that you have a light by your bed that is easy to reach.  Do not use any sheets or blankets that are too big for your bed. They should not hang down onto the floor.  Have a firm chair that has side arms. You can use this for support while you get dressed.  Do not have throw rugs and other things on the floor that can make  you trip. What can I do in the kitchen?  Clean up any spills right away.  Avoid walking on wet floors.  Keep items that you use a lot in easy-to-reach places.  If you need to reach something above you, use a strong step stool that has a grab bar.  Keep electrical cords out of the way.  Do not use floor polish or wax that makes floors slippery. If you must use wax, use non-skid floor wax.  Do not have throw rugs and other things on the floor that can make you trip. What can I do with my stairs?  Do not leave any items on the stairs.  Make sure that there are handrails on both sides of the stairs and use them. Fix handrails that are broken or loose. Make sure that handrails are as long as the stairways.  Check any carpeting to make sure that it is firmly  attached to the stairs. Fix any carpet that is loose or worn.  Avoid having throw rugs at the top or bottom of the stairs. If you do have throw rugs, attach them to the floor with carpet tape.  Make sure that you have a light switch at the top of the stairs and the bottom of the stairs. If you do not have them, ask someone to add them for you. What else can I do to help prevent falls?  Wear shoes that:  Do not have high heels.  Have rubber bottoms.  Are comfortable and fit you well.  Are closed at the toe. Do not wear sandals.  If you use a stepladder:  Make sure that it is fully opened. Do not climb a closed stepladder.  Make sure that both sides of the stepladder are locked into place.  Ask someone to hold it for you, if possible.  Clearly mark and make sure that you can see:  Any grab bars or handrails.  First and last steps.  Where the edge of each step is.  Use tools that help you move around (mobility aids) if they are needed. These include:  Canes.  Walkers.  Scooters.  Crutches.  Turn on the lights when you go into a dark area. Replace any light bulbs as soon as they burn out.  Set up your furniture so you have a clear path. Avoid moving your furniture around.  If any of your floors are uneven, fix them.  If there are any pets around you, be aware of where they are.  Review your medicines with your doctor. Some medicines can make you feel dizzy. This can increase your chance of falling. Ask your doctor what other things that you can do to help prevent falls. This information is not intended to replace advice given to you by your health care provider. Make sure you discuss any questions you have with your health care provider. Document Released: 08/23/2009 Document Revised: 04/03/2016 Document Reviewed: 12/01/2014 Elsevier Interactive Patient Education  2017 [employer].

## 2021-03-01 ENCOUNTER — Emergency Department (HOSPITAL_COMMUNITY): Payer: Medicare Other

## 2021-03-01 ENCOUNTER — Inpatient Hospital Stay (HOSPITAL_COMMUNITY)
Admission: EM | Admit: 2021-03-01 | Discharge: 2021-03-15 | DRG: 216 | Disposition: A | Discharge status: Home / Self Care | Payer: Medicare Other | Attending: Cardiothoracic Surgery | Admitting: Cardiothoracic Surgery

## 2021-03-01 ENCOUNTER — Other Ambulatory Visit: Payer: Self-pay

## 2021-03-01 ENCOUNTER — Encounter (HOSPITAL_COMMUNITY): Payer: Self-pay

## 2021-03-01 ENCOUNTER — Telehealth: Payer: Self-pay

## 2021-03-01 DIAGNOSIS — E1169 Type 2 diabetes mellitus with other specified complication: Secondary | ICD-10-CM

## 2021-03-01 DIAGNOSIS — R0600 Dyspnea, unspecified: Secondary | ICD-10-CM

## 2021-03-01 DIAGNOSIS — E559 Vitamin D deficiency, unspecified: Secondary | ICD-10-CM | POA: Diagnosis present

## 2021-03-01 DIAGNOSIS — K045 Chronic apical periodontitis: Secondary | ICD-10-CM

## 2021-03-01 DIAGNOSIS — Z006 Encounter for examination for normal comparison and control in clinical research program: Secondary | ICD-10-CM | POA: Diagnosis not present

## 2021-03-01 DIAGNOSIS — M797 Fibromyalgia: Secondary | ICD-10-CM | POA: Diagnosis present

## 2021-03-01 DIAGNOSIS — R001 Bradycardia, unspecified: Secondary | ICD-10-CM | POA: Diagnosis not present

## 2021-03-01 DIAGNOSIS — D62 Acute posthemorrhagic anemia: Secondary | ICD-10-CM | POA: Diagnosis not present

## 2021-03-01 DIAGNOSIS — I4581 Long QT syndrome: Secondary | ICD-10-CM | POA: Diagnosis present

## 2021-03-01 DIAGNOSIS — Z01818 Encounter for other preprocedural examination: Secondary | ICD-10-CM

## 2021-03-01 DIAGNOSIS — Z833 Family history of diabetes mellitus: Secondary | ICD-10-CM

## 2021-03-01 DIAGNOSIS — J90 Pleural effusion, not elsewhere classified: Secondary | ICD-10-CM

## 2021-03-01 DIAGNOSIS — I511 Rupture of chordae tendineae, not elsewhere classified: Secondary | ICD-10-CM | POA: Diagnosis present

## 2021-03-01 DIAGNOSIS — I272 Pulmonary hypertension, unspecified: Secondary | ICD-10-CM | POA: Diagnosis present

## 2021-03-01 DIAGNOSIS — M545 Low back pain, unspecified: Secondary | ICD-10-CM | POA: Diagnosis present

## 2021-03-01 DIAGNOSIS — K029 Dental caries, unspecified: Secondary | ICD-10-CM | POA: Diagnosis present

## 2021-03-01 DIAGNOSIS — Z9889 Other specified postprocedural states: Secondary | ICD-10-CM

## 2021-03-01 DIAGNOSIS — I77810 Thoracic aortic ectasia: Secondary | ICD-10-CM | POA: Diagnosis present

## 2021-03-01 DIAGNOSIS — G8929 Other chronic pain: Secondary | ICD-10-CM | POA: Diagnosis present

## 2021-03-01 DIAGNOSIS — R5082 Postprocedural fever: Secondary | ICD-10-CM | POA: Diagnosis not present

## 2021-03-01 DIAGNOSIS — Z01810 Encounter for preprocedural cardiovascular examination: Secondary | ICD-10-CM | POA: Diagnosis not present

## 2021-03-01 DIAGNOSIS — E119 Type 2 diabetes mellitus without complications: Secondary | ICD-10-CM | POA: Diagnosis present

## 2021-03-01 DIAGNOSIS — I341 Nonrheumatic mitral (valve) prolapse: Secondary | ICD-10-CM | POA: Diagnosis present

## 2021-03-01 DIAGNOSIS — R059 Cough, unspecified: Secondary | ICD-10-CM | POA: Diagnosis not present

## 2021-03-01 DIAGNOSIS — J9811 Atelectasis: Secondary | ICD-10-CM | POA: Diagnosis present

## 2021-03-01 DIAGNOSIS — Z20822 Contact with and (suspected) exposure to covid-19: Secondary | ICD-10-CM | POA: Diagnosis present

## 2021-03-01 DIAGNOSIS — I5033 Acute on chronic diastolic (congestive) heart failure: Secondary | ICD-10-CM

## 2021-03-01 DIAGNOSIS — I110 Hypertensive heart disease with heart failure: Secondary | ICD-10-CM | POA: Diagnosis present

## 2021-03-01 DIAGNOSIS — F17210 Nicotine dependence, cigarettes, uncomplicated: Secondary | ICD-10-CM | POA: Diagnosis present

## 2021-03-01 DIAGNOSIS — E785 Hyperlipidemia, unspecified: Secondary | ICD-10-CM | POA: Diagnosis present

## 2021-03-01 DIAGNOSIS — I4819 Other persistent atrial fibrillation: Secondary | ICD-10-CM | POA: Diagnosis present

## 2021-03-01 DIAGNOSIS — Z7982 Long term (current) use of aspirin: Secondary | ICD-10-CM

## 2021-03-01 DIAGNOSIS — I340 Nonrheumatic mitral (valve) insufficiency: Secondary | ICD-10-CM | POA: Diagnosis present

## 2021-03-01 DIAGNOSIS — K083 Retained dental root: Secondary | ICD-10-CM

## 2021-03-01 DIAGNOSIS — D696 Thrombocytopenia, unspecified: Secondary | ICD-10-CM | POA: Diagnosis not present

## 2021-03-01 DIAGNOSIS — I2510 Atherosclerotic heart disease of native coronary artery without angina pectoris: Secondary | ICD-10-CM | POA: Diagnosis present

## 2021-03-01 DIAGNOSIS — I10 Essential (primary) hypertension: Secondary | ICD-10-CM | POA: Diagnosis present

## 2021-03-01 DIAGNOSIS — K036 Deposits [accretions] on teeth: Secondary | ICD-10-CM

## 2021-03-01 DIAGNOSIS — I4892 Unspecified atrial flutter: Secondary | ICD-10-CM | POA: Diagnosis not present

## 2021-03-01 DIAGNOSIS — R0689 Other abnormalities of breathing: Secondary | ICD-10-CM

## 2021-03-01 DIAGNOSIS — E1159 Type 2 diabetes mellitus with other circulatory complications: Secondary | ICD-10-CM | POA: Diagnosis present

## 2021-03-01 DIAGNOSIS — J9601 Acute respiratory failure with hypoxia: Secondary | ICD-10-CM | POA: Diagnosis present

## 2021-03-01 DIAGNOSIS — Z8249 Family history of ischemic heart disease and other diseases of the circulatory system: Secondary | ICD-10-CM

## 2021-03-01 DIAGNOSIS — F40232 Fear of other medical care: Secondary | ICD-10-CM | POA: Diagnosis not present

## 2021-03-01 DIAGNOSIS — K0530 Chronic periodontitis, unspecified: Secondary | ICD-10-CM

## 2021-03-01 DIAGNOSIS — I4891 Unspecified atrial fibrillation: Principal | ICD-10-CM

## 2021-03-01 DIAGNOSIS — Z7984 Long term (current) use of oral hypoglycemic drugs: Secondary | ICD-10-CM

## 2021-03-01 DIAGNOSIS — Z952 Presence of prosthetic heart valve: Secondary | ICD-10-CM

## 2021-03-01 DIAGNOSIS — K0850 Unsatisfactory restoration of tooth, unspecified: Secondary | ICD-10-CM

## 2021-03-01 DIAGNOSIS — Z79899 Other long term (current) drug therapy: Secondary | ICD-10-CM

## 2021-03-01 LAB — RESP PANEL BY RT-PCR (FLU A&B, COVID) ARPGX2
Influenza A by PCR: NEGATIVE
Influenza B by PCR: NEGATIVE
SARS Coronavirus 2 by RT PCR: NEGATIVE

## 2021-03-01 LAB — GLUCOSE, CAPILLARY: Glucose-Capillary: 203 mg/dL — ABNORMAL HIGH (ref 70–99)

## 2021-03-01 LAB — HEPATIC FUNCTION PANEL
ALT: 20 U/L (ref 0–44)
AST: 26 U/L (ref 15–41)
Albumin: 4 g/dL (ref 3.5–5.0)
Alkaline Phosphatase: 48 U/L (ref 38–126)
Bilirubin, Direct: 0.1 mg/dL (ref 0.0–0.2)
Indirect Bilirubin: 0.6 mg/dL (ref 0.3–0.9)
Total Bilirubin: 0.7 mg/dL (ref 0.3–1.2)
Total Protein: 7.4 g/dL (ref 6.5–8.1)

## 2021-03-01 LAB — CBC
HCT: 41.2 % (ref 39.0–52.0)
Hemoglobin: 13.9 g/dL (ref 13.0–17.0)
MCH: 30.3 pg (ref 26.0–34.0)
MCHC: 33.7 g/dL (ref 30.0–36.0)
MCV: 90 fL (ref 80.0–100.0)
Platelets: 375 10*3/uL (ref 150–400)
RBC: 4.58 MIL/uL (ref 4.22–5.81)
RDW: 12.5 % (ref 11.5–15.5)
WBC: 10 10*3/uL (ref 4.0–10.5)
nRBC: 0 % (ref 0.0–0.2)

## 2021-03-01 LAB — BASIC METABOLIC PANEL
Anion gap: 10 (ref 5–15)
BUN: 24 mg/dL — ABNORMAL HIGH (ref 8–23)
CO2: 22 mmol/L (ref 22–32)
Calcium: 9.3 mg/dL (ref 8.9–10.3)
Chloride: 104 mmol/L (ref 98–111)
Creatinine, Ser: 0.95 mg/dL (ref 0.61–1.24)
GFR, Estimated: >60 mL/min (ref >60)
Glucose, Bld: 139 mg/dL — ABNORMAL HIGH (ref 70–99)
Potassium: 3.9 mmol/L (ref 3.5–5.1)
Sodium: 136 mmol/L (ref 135–145)

## 2021-03-01 LAB — BRAIN NATRIURETIC PEPTIDE: B Natriuretic Peptide: 301 pg/mL — ABNORMAL HIGH (ref 0.0–100.0)

## 2021-03-01 LAB — TROPONIN I (HIGH SENSITIVITY)
Troponin I (High Sensitivity): 11 ng/L (ref <18)
Troponin I (High Sensitivity): 11 ng/L (ref <18)

## 2021-03-01 LAB — D-DIMER, QUANTITATIVE: D-Dimer, Quant: 1.19 ug/mL-FEU — ABNORMAL HIGH (ref 0.00–0.50)

## 2021-03-01 MED ORDER — FENOFIBRATE 160 MG PO TABS
160.0000 mg | ORAL_TABLET | Freq: Every day | ORAL | Status: DC
  Administered 2021-03-02 – 2021-03-07 (×6): 160 mg via ORAL
  Filled 2021-03-01 (×6): qty 1

## 2021-03-01 MED ORDER — HEPARIN (PORCINE) 25000 UT/250ML-% IV SOLN
1300.0000 [IU]/h | INTRAVENOUS | Status: DC
  Administered 2021-03-01: 1300 [IU]/h via INTRAVENOUS
  Filled 2021-03-01: qty 250

## 2021-03-01 MED ORDER — ONDANSETRON HCL 4 MG/2ML IJ SOLN: 4.0000 mg | Freq: Four times a day (QID) | INTRAMUSCULAR | Status: DC | PRN

## 2021-03-01 MED ORDER — ROSUVASTATIN CALCIUM 5 MG PO TABS
10.0000 mg | ORAL_TABLET | Freq: Every day | ORAL | Status: DC
  Administered 2021-03-01 – 2021-03-07 (×7): 10 mg via ORAL
  Filled 2021-03-01 (×2): qty 1
  Filled 2021-03-01 (×2): qty 2
  Filled 2021-03-01 (×2): qty 1
  Filled 2021-03-01: qty 2

## 2021-03-01 MED ORDER — IOHEXOL 350 MG/ML SOLN
100.0000 mL | Freq: Once | INTRAVENOUS | Status: AC | PRN
  Administered 2021-03-01: 100 mL via INTRAVENOUS

## 2021-03-01 MED ORDER — SODIUM CHLORIDE 0.9% FLUSH
3.0000 mL | Freq: Two times a day (BID) | INTRAVENOUS | Status: DC
  Administered 2021-03-01 – 2021-03-07 (×12): 3 mL via INTRAVENOUS

## 2021-03-01 MED ORDER — POTASSIUM CHLORIDE CRYS ER 20 MEQ PO TBCR
40.0000 meq | EXTENDED_RELEASE_TABLET | Freq: Two times a day (BID) | ORAL | Status: DC
  Administered 2021-03-01 – 2021-03-05 (×8): 40 meq via ORAL
  Filled 2021-03-01 (×8): qty 2

## 2021-03-01 MED ORDER — FUROSEMIDE 10 MG/ML IJ SOLN
40.0000 mg | Freq: Once | INTRAMUSCULAR | Status: AC
  Administered 2021-03-01: 40 mg via INTRAVENOUS
  Filled 2021-03-01: qty 4

## 2021-03-01 MED ORDER — CYCLOBENZAPRINE HCL 10 MG PO TABS: 10.0000 mg | ORAL_TABLET | Freq: Three times a day (TID) | ORAL | Status: DC | PRN

## 2021-03-01 MED ORDER — SODIUM CHLORIDE 0.9 % IV SOLN: 250.0000 mL | INTRAVENOUS | Status: DC | PRN

## 2021-03-01 MED ORDER — ACETAMINOPHEN 325 MG PO TABS
650.0000 mg | ORAL_TABLET | ORAL | Status: DC | PRN
  Administered 2021-03-02 (×2): 650 mg via ORAL
  Filled 2021-03-01 (×2): qty 2

## 2021-03-01 MED ORDER — APIXABAN 5 MG PO TABS
5.0000 mg | ORAL_TABLET | Freq: Two times a day (BID) | ORAL | Status: DC
  Administered 2021-03-01 – 2021-03-03 (×4): 5 mg via ORAL
  Filled 2021-03-01 (×5): qty 1

## 2021-03-01 MED ORDER — METOPROLOL TARTRATE 25 MG PO TABS
25.0000 mg | ORAL_TABLET | Freq: Two times a day (BID) | ORAL | Status: DC
  Administered 2021-03-01 – 2021-03-07 (×13): 25 mg via ORAL
  Filled 2021-03-01 (×13): qty 1

## 2021-03-01 MED ORDER — OXYCODONE HCL 5 MG PO TABS
15.0000 mg | ORAL_TABLET | Freq: Four times a day (QID) | ORAL | Status: DC
  Administered 2021-03-01 – 2021-03-07 (×24): 15 mg via ORAL
  Filled 2021-03-01 (×26): qty 3

## 2021-03-01 MED ORDER — FUROSEMIDE 10 MG/ML IJ SOLN
20.0000 mg | Freq: Two times a day (BID) | INTRAMUSCULAR | Status: DC
  Administered 2021-03-01 – 2021-03-03 (×3): 20 mg via INTRAVENOUS
  Filled 2021-03-01 (×4): qty 2

## 2021-03-01 MED ORDER — AMLODIPINE BESYLATE 5 MG PO TABS
10.0000 mg | ORAL_TABLET | Freq: Every day | ORAL | Status: DC
  Administered 2021-03-02 – 2021-03-04 (×3): 10 mg via ORAL
  Filled 2021-03-01 (×3): qty 2

## 2021-03-01 MED ORDER — INSULIN DETEMIR 100 UNIT/ML ~~LOC~~ SOLN
15.0000 [IU] | Freq: Every day | SUBCUTANEOUS | Status: DC
  Filled 2021-03-01 (×3): qty 0.15

## 2021-03-01 MED ORDER — SODIUM CHLORIDE 0.9% FLUSH
3.0000 mL | INTRAVENOUS | Status: DC | PRN
  Administered 2021-03-05: 3 mL via INTRAVENOUS

## 2021-03-01 MED ORDER — ASPIRIN EC 81 MG PO TBEC
81.0000 mg | DELAYED_RELEASE_TABLET | Freq: Every day | ORAL | Status: DC
  Administered 2021-03-01 – 2021-03-02 (×2): 81 mg via ORAL
  Filled 2021-03-01 (×2): qty 1

## 2021-03-01 MED ORDER — FELODIPINE ER 5 MG PO TB24
10.0000 mg | ORAL_TABLET | Freq: Every day | ORAL | Status: DC
  Administered 2021-03-02: 10 mg via ORAL
  Filled 2021-03-01 (×3): qty 1

## 2021-03-01 MED ORDER — APIXABAN 5 MG PO TABS: 5.0000 mg | ORAL_TABLET | Freq: Two times a day (BID) | ORAL | Status: DC

## 2021-03-01 MED ORDER — HYDROCHLOROTHIAZIDE 25 MG PO TABS
25.0000 mg | ORAL_TABLET | Freq: Every day | ORAL | Status: DC
  Administered 2021-03-02: 25 mg via ORAL
  Filled 2021-03-01: qty 1

## 2021-03-01 MED ORDER — HEPARIN BOLUS VIA INFUSION
5000.0000 [IU] | Freq: Once | INTRAVENOUS | Status: AC
  Administered 2021-03-01: 5000 [IU] via INTRAVENOUS

## 2021-03-01 NOTE — ED Notes (Signed)
Patient off unit at to CT at this time.

## 2021-03-01 NOTE — ED Triage Notes (Signed)
Pt presents to ED with complaints of SOB since Tuesday, denies fever. Pt states Sob is worse when laying down. Pt went to family physician today, had EKG showed new onset of a fib, sent to ED for evaluation.

## 2021-03-01 NOTE — ED Triage Notes (Signed)
Emergency Medicine Provider Triage Evaluation Note  Michael Valenzuela , a 65 y.o. male  was evaluated in triage.  Pt complains of a pulling sensation of his mid chest for 5 days.  Short of breath, mostly with exertion x3 days.  Was seen at urgent care earlier today and contacted his PCPs office and was advised to come to the emergency room for further evaluation.  States that he had an EKG at urgent care that showed an arrhythmia of his heart.  He denies any prior cardiac problems.  Does smoke cigarettes.  Denies nausea, vomiting, cough, fever or chills.  Review of Systems  Positive: Chest pain, shortness of breath Negative: Jaw pain, arm pain, fever or chills  Physical Exam  BP (!) 143/85 (BP Location: Right Arm)   Pulse 86   Temp 98.5 F (36.9 C) (Oral)   Resp 18   Ht 5\' 11"  (1.803 m)   Wt 92.5 kg   SpO2 95%   BMI 28.45 kg/m  Gen:   Awake, no distress   HEENT:  Atraumatic  Resp:  Normal effort, lungs clear to auscultation bilaterally Cardiac:  Normal rate, murmur noted Abd:   Nondistended, nontender  MSK:   Moves extremities without difficulty, no edema Neuro:  Speech clear   Medical Decision Making  Medically screening exam initiated at 1:24 PM.  Appropriate orders placed.  Johnjoseph Rolfe was informed that the remainder of the evaluation will be completed by another provider, this initial triage assessment does not replace that evaluation, and the importance of remaining in the ED until their evaluation is complete.  Clinical Impression  Patient with mid chest pain waxing and waning x5 days shortness of breath x3 days.  Will need further ER evaluation for possible acute cardiac process.   Kem Parkinson, PA-C 03/01/21 1328

## 2021-03-01 NOTE — Care Plan (Signed)
Pt to unit via wheelchair at this time, pt is AxOx4. Pt denies SOB, on room air upon arrival to floor. Pt states he was feeling bad to breathe now after lasix, has improved. Pt denies pain. Pt oriented to room and how to call for help at this time. Last BM [id number]. Will continue to monitor.

## 2021-03-01 NOTE — Telephone Encounter (Signed)
Spoke with patient and he states that he felt SOB this AM and he went to Urgent Care. They told him his lungs sound fine but he is in Afib and advised he go to the nearest ER. He is not sure if he should go or not. Advised patient with a new onset Afib and SOB he needs to be evaluated at the ER. Patient verbalized understanding and stated that he would go to [facility] ER.

## 2021-03-01 NOTE — ED Notes (Signed)
Spoke with [place] who states to stop Heparin drip with first dose of Apixaban this evening. Administration dose currently scheduled for 2200.

## 2021-03-01 NOTE — H&P (Signed)
History and Physical  Michael Valenzuela GYI:[id number] DOB: 01/04/1956 DOA: 03/01/2021  Referring physician: Rayna Sexton, PA-C, EDP PCP: Chevis Pretty, [place]  Outpatient Specialists: none  Patient Coming From: home  Chief Complaint: Pleurisy  HPI: Michael Valenzuela is a 65 y.o. male with a history of diabetes, fibromyalgia, hyperlipidemia and hypertension.  Patient presents with 3 to 4 days pleurisy and orthopnea.  His symptoms have been worsening with past couple of days.  He has been having increasing difficulty sleeping due to the pleurisy and presented to urgent care for evaluation.  EKG was done and the patient was found to be in atrial fibrillation.  He was sent to the hospital for further evaluation.  Normal here, he was in sinus rhythm, but then converted into atrial fibrillation.  He has been rate controlled.  Patient has been mildly hypoxic, which improved with oxygen via nasal cannula.  No palliating or provoking factors.  Denies fevers, chills, nausea, vomiting.  Denies chest pain  Emergency Department Course: proBNP slightly elevated.  Chest x-ray shows pulmonary edema.  CTA of the chest shows bilateral moderate pleural effusions.  Patient started on heparin drip.  Review of Systems:   Pt denies any fevers, chills, nausea, vomiting, diarrhea, constipation, abdominal pain, cough, wheezing, palpitations, headache, vision changes, lightheadedness, dizziness, melena, rectal bleeding.  Review of systems are otherwise negative  Past Medical History:  Diagnosis Date  . Chronic low back pain   . Diabetes mellitus without complication ([place])   . Fibromyalgia   . Hyperlipidemia   . Hypertension   . Vitamin D deficiency    Past Surgical History:  Procedure Laterality Date  . HERNIA REPAIR     Social History:  reports that he has been smoking cigarettes. He has a 10.00 pack-year smoking history. He has never used smokeless tobacco. He reports current alcohol use. He reports that he  does not use drugs. Patient lives at home  No Known Allergies  Family History  Problem Relation Age of Onset  . Heart disease Mother   . Diabetes Mother   . Cancer Mother   . Heart disease Father   . Diabetes Son 15       type 1  . Diabetes Paternal Uncle   . Heart attack Brother   . Hypertension Son       Prior to Admission medications   Medication Sig Start Date End Date Taking? Authorizing Provider  amLODipine (NORVASC) 10 MG tablet TAKE 1 TABLET(10 MG) BY MOUTH DAILY 12/28/20   Hassell Done, Mary-Margaret, FNP  aspirin EC 81 MG tablet Take 1 tablet (81 mg total) by mouth daily. 09/18/14   Cherre Robins, PharmD  cyclobenzaprine (FLEXERIL) 10 MG tablet TAKE 1 TABLET BY MOUTH THREE TIMES DAILY AS NEEDED FOR MUSCLE SPASMS 01/16/21   Hassell Done, Mary-Margaret, FNP  felodipine (PLENDIL) 10 MG 24 hr tablet TAKE 1 TABLET(10 MG) BY MOUTH DAILY 12/28/20   Chevis Pretty, FNP  fenofibrate 160 MG tablet TAKE 1 TABLET(160 MG) BY MOUTH DAILY 12/28/20   Hassell Done, Mary-Margaret, FNP  glucose blood test strip TrueTrack strips. Test BS qd. Dx E11.9 01/26/19   Chevis Pretty, FNP  hydrochlorothiazide (HYDRODIURIL) 25 MG tablet TAKE 1 TABLET(25 MG) BY MOUTH DAILY 12/28/20   Chevis Pretty, FNP  Lancet Devices [facility]) lancets Use as instructed 10/24/14   Chevis Pretty, FNP  losartan (COZAAR) 100 MG tablet TAKE 1 TABLET(100 MG) BY MOUTH DAILY 12/28/20   Hassell Done, Mary-Margaret, FNP  metFORMIN (GLUCOPHAGE) 1000 MG tablet TAKE 1 TABLET(1000 MG)  BY MOUTH TWICE DAILY WITH A MEAL 12/28/20   Hassell Done, Mary-Margaret, FNP  oxyCODONE (ROXICODONE) 15 MG immediate release tablet TK 1 T PO Q 6 H 01/12/19   [provider]  rosuvastatin (CRESTOR) 10 MG tablet Take 1 tablet (10 mg total) by mouth daily. 12/28/20   Hassell Done Mary-Margaret, FNP  sitaGLIPtin (JANUVIA) 100 MG tablet TAKE 1 TABLET(100 MG) BY MOUTH DAILY 12/28/20   Chevis Pretty, FNP    Physical Exam: BP 135/86   Pulse 81    Temp 98.5 F (36.9 C) (Oral)   Resp 16   Ht 5\' 11"  (1.803 m)   Wt 92.5 kg   SpO2 93%   BMI 28.45 kg/m   . General: Elderly male. Awake and alert and oriented x3. No acute cardiopulmonary distress.  Marland Kitchen HEENT: Normocephalic atraumatic.  Right and left ears normal in appearance.  Pupils equal, round, reactive to light. Extraocular muscles are intact. Sclerae anicteric and noninjected.  Moist mucosal membranes. No mucosal lesions.  . Neck: Neck supple without lymphadenopathy. No carotid bruits. No masses palpated.  . Cardiovascular: Irregularly irregular rate with normal S1-S2 sounds. No murmurs, rubs, gallops auscultated. No JVD.  Marland Kitchen Respiratory: Diminished breath sounds in the bases.  No rales.  No accessory muscle use. . Abdomen: Soft, nontender, nondistended. Active bowel sounds. No masses or hepatosplenomegaly  . Skin: No rashes, lesions, or ulcerations.  Dry, warm to touch. 2+ dorsalis pedis and radial pulses. . Musculoskeletal: No calf or leg pain. All major joints not erythematous nontender.  No upper or lower joint deformation.  Good ROM.  No contractures  . Psychiatric: Intact judgment and insight. Pleasant and cooperative. . Neurologic: No focal neurological deficits. Strength is 5/5 and symmetric in upper and lower extremities.  Cranial nerves II through XII are grossly intact.           Labs on Admission: I have personally reviewed following labs and imaging studies  CBC: Recent Labs  Lab 03/01/21 1343  WBC 10.0  HGB 13.9  HCT 41.2  MCV 90.0  PLT 123456   Basic Metabolic Panel: Recent Labs  Lab 03/01/21 1343  NA 136  K 3.9  CL 104  CO2 22  GLUCOSE 139*  BUN 24*  CREATININE 0.95  CALCIUM 9.3   GFR: Estimated Creatinine Clearance: 90.1 mL/min (by C-G formula based on SCr of 0.95 mg/dL). Liver Function Tests: Recent Labs  Lab 03/01/21 1343  AST 26  ALT 20  ALKPHOS 48  BILITOT 0.7  PROT 7.4  ALBUMIN 4.0   No results for input(s): LIPASE, AMYLASE in the  last 168 hours. No results for input(s): AMMONIA in the last 168 hours. Coagulation Profile: No results for input(s): INR, PROTIME in the last 168 hours. Cardiac Enzymes: No results for input(s): CKTOTAL, CKMB, CKMBINDEX, TROPONINI in the last 168 hours. BNP (last 3 results) No results for input(s): PROBNP in the last 8760 hours. HbA1C: No results for input(s): HGBA1C in the last 72 hours. CBG: No results for input(s): GLUCAP in the last 168 hours. Lipid Profile: No results for input(s): CHOL, HDL, LDLCALC, TRIG, CHOLHDL, LDLDIRECT in the last 72 hours. Thyroid Function Tests: No results for input(s): TSH, T4TOTAL, FREET4, T3FREE, THYROIDAB in the last 72 hours. Anemia Panel: No results for input(s): VITAMINB12, FOLATE, FERRITIN, TIBC, IRON, RETICCTPCT in the last 72 hours. Urine analysis: No results found for: COLORURINE, APPEARANCEUR, LABSPEC, PHURINE, GLUCOSEU, HGBUR, BILIRUBINUR, KETONESUR, PROTEINUR, UROBILINOGEN, NITRITE, LEUKOCYTESUR Sepsis Labs: @LABRCNTIP (procalcitonin:4,lacticidven:4) ) Recent Results (from the past 240  hour(s))  Resp Panel by RT-PCR (Flu A&B, Covid) Nasopharyngeal Swab     Status: None   Collection Time: 03/01/21  3:05 PM   Specimen: Nasopharyngeal Swab; Nasopharyngeal(NP) swabs in vial transport medium  Result Value Ref Range Status   SARS Coronavirus 2 by RT PCR NEGATIVE NEGATIVE Final    Comment: (NOTE) SARS-CoV-2 target nucleic acids are NOT DETECTED.  The SARS-CoV-2 RNA is generally detectable in upper respiratory specimens during the acute phase of infection. The lowest concentration of SARS-CoV-2 viral copies this assay can detect is 138 copies/mL. A negative result does not preclude SARS-Cov-2 infection and should not be used as the sole basis for treatment or other patient management decisions. A negative result may occur with  improper specimen collection/handling, submission of specimen other than nasopharyngeal swab, presence of viral  mutation(s) within the areas targeted by this assay, and inadequate number of viral copies(<138 copies/mL). A negative result must be combined with clinical observations, patient history, and epidemiological information. The expected result is Negative.  Fact Sheet for Patients:  EntrepreneurPulse.com.au  Fact Sheet for Healthcare Providers:  IncredibleEmployment.be  This test is no t yet approved or cleared by the [place] FDA and  has been authorized for detection and/or diagnosis of SARS-CoV-2 by FDA under an Emergency Use Authorization (EUA). This EUA will remain  in effect (meaning this test can be used) for the duration of the COVID-19 declaration under Section 564(b)(1) of the Act, 21 U.S.C.section 360bbb-3(b)(1), unless the authorization is terminated  or revoked sooner.       Influenza A by PCR NEGATIVE NEGATIVE Final   Influenza B by PCR NEGATIVE NEGATIVE Final    Comment: (NOTE) The Xpert Xpress SARS-CoV-2/FLU/RSV plus assay is intended as an aid in the diagnosis of influenza from Nasopharyngeal swab specimens and should not be used as a sole basis for treatment. Nasal washings and aspirates are unacceptable for Xpert Xpress SARS-CoV-2/FLU/RSV testing.  Fact Sheet for Patients: EntrepreneurPulse.com.au  Fact Sheet for Healthcare Providers: IncredibleEmployment.be  This test is not yet approved or cleared by the [place] FDA and has been authorized for detection and/or diagnosis of SARS-CoV-2 by FDA under an Emergency Use Authorization (EUA). This EUA will remain in effect (meaning this test can be used) for the duration of the COVID-19 declaration under Section 564(b)(1) of the Act, 21 U.S.C. section 360bbb-3(b)(1), unless the authorization is terminated or revoked.  Performed at [facility], [address], [place], [place] [address]      Radiological Exams on Admission: DG  Chest 2 View  Result Date: 03/01/2021 CLINICAL DATA:  Shortness of breath. EXAM: CHEST - 2 VIEW COMPARISON:  December 31, 2015. FINDINGS: The heart size and mediastinal contours are within normal limits. Mild central pulmonary vascular congestion is noted with possible minimal bibasilar pulmonary edema. Small bilateral pleural effusions are noted. No pneumothorax is noted. The visualized skeletal structures are unremarkable. IMPRESSION: Mild central pulmonary vascular congestion is noted with possible minimal bibasilar pulmonary edema. Small bilateral pleural effusions are noted. Electronically Signed   By: Marijo Conception M.D.   On: 03/01/2021 13:57   CT Angio Chest PE W and/or Wo Contrast  Result Date: 03/01/2021 CLINICAL DATA:  PE suspected, high prob Shortness of breath. EXAM: CT ANGIOGRAPHY CHEST WITH CONTRAST TECHNIQUE: Multidetector CT imaging of the chest was performed using the standard protocol during bolus administration of intravenous contrast. Multiplanar CT image reconstructions and MIPs were obtained to evaluate the vascular anatomy. CONTRAST:  169mL OMNIPAQUE IOHEXOL  350 MG/ML SOLN COMPARISON:  Radiograph earlier today. FINDINGS: Cardiovascular: There are no filling defects within the pulmonary arteries to suggest pulmonary embolus. Mild aortic atherosclerosis without aneurysm or dissection. Mild cardiomegaly. Coronary artery and mitral annulus calcifications. No pericardial effusion. Mediastinum/Nodes: Mm anterior paratracheal node, series 4, image 45. Minimal soft tissue density at the right hilum likely small lymph nodes. Small hiatal hernia. No visualized thyroid nodule. Lungs/Pleura: Moderate bilateral pleural effusions with adjacent compressive atelectasis. Scattered smooth septal thickening consistent with pulmonary edema. There are peribronchovascular ground-glass opacities in the right perihilar lung and right upper lobe. Associated right greater than left bronchial wall thickening.  Upper Abdomen: Water density lesion in the left lobe of the liver is typically cyst. No acute findings in the included upper abdomen. Musculoskeletal: Mild thoracic spondylosis. There are no acute or suspicious osseous abnormalities. Review of the MIP images confirms the above findings. IMPRESSION: 1. No pulmonary embolus. 2. Moderate bilateral pleural effusions with adjacent compressive atelectasis. Smooth septal thickening consistent with pulmonary edema. 3. Peribronchial ground-glass opacities in the right perihilar lung and right upper lobe. Differential considerations include asymmetric alveolar edema, infectious or inflammatory etiology. Consider follow-up after resolution of symptoms to assess for resolution. 4. Bronchial wall thickening, can be seen with bronchitis or congestive. 5. Mild cardiomegaly. Coronary artery calcifications. 6. Small hiatal hernia. Aortic Atherosclerosis (ICD10-I70.0). Electronically Signed   By: Keith Rake M.D.   On: 03/01/2021 17:05    EKG independently reviewed.  Showing A. fib with mild LVH.  No acute ST changes..   Assessment/Plan: Active Problems:   Fibromyalgia   Essential hypertension, benign   Hyperlipidemia with target LDL less than 100   Type 2 diabetes mellitus with other specified complication (HCC)   Pleural effusion   Atrial fibrillation [facility])    This patient was discussed with the ED physician, including pertinent vitals, physical exam findings, labs, and imaging.  We also discussed care given by the ED provider.  1. Atrial fibrillation, rate controlled a. Admit to telemetry b. Stop heparin and transition to Eliquis c. TSH in the morning d. Magnesium e. Echocardiogram in the morning f. Start metoprolol g. There is some secondary heart failure -we will start Lasix with strict I's and O's. h. Replace potassium i. Due to CTA, will hold Cozaar and will restart in a couple of days. 2. Pleural effusion a. Question whether this is secondary  to CHF or secondary to A. fib for that this is the inciting factor for the patient's atrial fibrillation. b. Continue Lasix c. May need drainage depending on patient's response with Lasix 3. Diabetes a. Hold oral hypoglycemics -particular metformin due to CTA. b. Levemir 15 units at bedtime c. Plan scale insulin with CBGs before meals and nightly 4. Hyperlipidemia a. Continue home regimen 5. Hypertension a. Hold Cozaar b. Start metoprolol 6. Fibromyalgia a. Continue home pain regimen  DVT prophylaxis: Eliquis Consultants: None Code Status: Full code Family Communication: None Disposition Plan: Patient should be able to return home following improvement   Truett Mainland, DO

## 2021-03-01 NOTE — ED Provider Notes (Signed)
[facility] Provider Note   CSN: [id number] Arrival date & time: 03/01/21  1258     History Chief Complaint  Patient presents with  . Shortness of Breath    Jud Michael Valenzuela is a 65 y.o. male.  HPI Patient is a 65 year old male with history of fibromyalgia, diabetes mellitus, hypertension, hyperlipidemia, type 2 diabetes mellitus, who presents the emergency department due to chest tightness.  Patient states his symptoms started about 6 days ago after working out in the yard.  He states that they lasted for a short time and resolved.  About 2 days later he states that he laid down to go to sleep and began having additional chest tightness and shortness of breath when lying flat.  He states improved with elevation of his head and chest.  He states his chest tightness and shortness of breath has been waxing and waning for the past few days and then went to urgent care today for evaluation.  They obtained an EKG and found the patient had an irregular heart rhythm and told him to come to the emergency department for evaluation.  He reports a history of smoking 1/2 pack/day and quit smoking about 2 weeks ago.  No prior cardiac history.  No nausea, vomiting, cough, fevers, chills, URI symptoms, diaphoresis.  No recent travel, leg swelling, hemoptysis, recent surgeries, history of cancer, hormone use, history of blood clots.    Past Medical History:  Diagnosis Date  . Chronic low back pain   . Diabetes mellitus without complication ([place])   . Fibromyalgia   . Hyperlipidemia   . Hypertension   . Vitamin D deficiency     Patient Active Problem List   Diagnosis Date Noted  . Hx of tobacco use, presenting hazards to health 12/15/2019  . BMI 30.0-30.9,adult 06/21/2015  . Type 2 diabetes mellitus with other specified complication ([place]) 32/35/5732  . Essential hypertension, benign 01/13/2013  . Hyperlipidemia with target LDL less than 100 01/13/2013  . Fibromyalgia   . Chronic  low back pain   . Vitamin D deficiency     Past Surgical History:  Procedure Laterality Date  . HERNIA REPAIR         Family History  Problem Relation Age of Onset  . Heart disease Mother   . Diabetes Mother   . Cancer Mother   . Heart disease Father   . Diabetes Son 15       type 1  . Diabetes Paternal Uncle   . Heart attack Brother   . Hypertension Son     Social History   Tobacco Use  . Smoking status: Current Every Day Smoker    Packs/day: 0.50    Years: 20.00    Pack years: 10.00    Types: Cigarettes  . Smokeless tobacco: Never Used  Vaping Use  . Vaping Use: Never used  Substance Use Topics  . Alcohol use: Yes    Comment: occasional  . Drug use: No    Home Medications Prior to Admission medications   Medication Sig Start Date End Date Taking? Authorizing Provider  amLODipine (NORVASC) 10 MG tablet TAKE 1 TABLET(10 MG) BY MOUTH DAILY 12/28/20   Hassell Done, Mary-Margaret, FNP  aspirin EC 81 MG tablet Take 1 tablet (81 mg total) by mouth daily. 09/18/14   Cherre Robins, PharmD  cyclobenzaprine (FLEXERIL) 10 MG tablet TAKE 1 TABLET BY MOUTH THREE TIMES DAILY AS NEEDED FOR MUSCLE SPASMS 01/16/21   Hassell Done, Mary-Margaret, FNP  felodipine (PLENDIL) 10  MG 24 hr tablet TAKE 1 TABLET(10 MG) BY MOUTH DAILY 12/28/20   Chevis Pretty, FNP  fenofibrate 160 MG tablet TAKE 1 TABLET(160 MG) BY MOUTH DAILY 12/28/20   Hassell Done, Mary-Margaret, FNP  glucose blood test strip TrueTrack strips. Test BS qd. Dx E11.9 01/26/19   Chevis Pretty, FNP  hydrochlorothiazide (HYDRODIURIL) 25 MG tablet TAKE 1 TABLET(25 MG) BY MOUTH DAILY 12/28/20   Chevis Pretty, FNP  Lancet Devices [facility]) lancets Use as instructed 10/24/14   Chevis Pretty, FNP  losartan (COZAAR) 100 MG tablet TAKE 1 TABLET(100 MG) BY MOUTH DAILY 12/28/20   Hassell Done, Mary-Margaret, FNP  metFORMIN (GLUCOPHAGE) 1000 MG tablet TAKE 1 TABLET(1000 MG) BY MOUTH TWICE DAILY WITH A MEAL 12/28/20   Hassell Done,  Mary-Margaret, FNP  oxyCODONE (ROXICODONE) 15 MG immediate release tablet TK 1 T PO Q 6 H 01/12/19   [provider]  rosuvastatin (CRESTOR) 10 MG tablet Take 1 tablet (10 mg total) by mouth daily. 12/28/20   Hassell Done Mary-Margaret, FNP  sitaGLIPtin (JANUVIA) 100 MG tablet TAKE 1 TABLET(100 MG) BY MOUTH DAILY 12/28/20   Chevis Pretty, FNP    Allergies    Patient has no known allergies.  Review of Systems   Review of Systems  All other systems reviewed and are negative. Ten systems reviewed and are negative for acute change, except as noted in the HPI.   Physical Exam Updated Vital Signs BP 129/87   Pulse 62   Temp 98.5 F (36.9 C) (Oral)   Resp 18   Ht 5\' 11"  (1.803 m)   Wt 92.5 kg   SpO2 92%   BMI 28.45 kg/m   Physical Exam Vitals and nursing note reviewed.  Constitutional:      General: He is not in acute distress.    Appearance: Normal appearance. He is not ill-appearing, toxic-appearing or diaphoretic.     Interventions: He is not intubated. HENT:     Head: Normocephalic and atraumatic.     Right Ear: External ear normal.     Left Ear: External ear normal.     Nose: Nose normal.     Mouth/Throat:     Mouth: Mucous membranes are moist.     Pharynx: Oropharynx is clear. No oropharyngeal exudate or posterior oropharyngeal erythema.  Eyes:     Extraocular Movements: Extraocular movements intact.  Cardiovascular:     Rate and Rhythm: Normal rate and regular rhythm.     Pulses: Normal pulses.     Heart sounds: Murmur heard.  No friction rub. No gallop.      Comments: Regular rate and rhythm.  Systolic murmur appreciated.  No rubs or gallops. Pulmonary:     Effort: Pulmonary effort is normal. No tachypnea, bradypnea, accessory muscle usage or respiratory distress. He is not intubated.     Breath sounds: Normal breath sounds. No stridor. No decreased breath sounds, wheezing, rhonchi or rales.  Abdominal:     General: Abdomen is flat.     Palpations:  Abdomen is soft.     Tenderness: There is no abdominal tenderness.     Comments: Abdomen is flat, soft, and nontender  Musculoskeletal:        General: Normal range of motion.     Cervical back: Normal range of motion and neck supple. No tenderness.     Right lower leg: No tenderness. No edema.     Left lower leg: No tenderness. No edema.     Comments: No pedal edema.  Skin:  General: Skin is warm and dry.  Neurological:     General: No focal deficit present.     Mental Status: He is alert and oriented to person, place, and time.  Psychiatric:        Mood and Affect: Mood normal.        Behavior: Behavior normal.    ED Results / Procedures / Treatments   Labs (all labs ordered are listed, but only abnormal results are displayed) Labs Reviewed  BASIC METABOLIC PANEL - Abnormal; Notable for the following components:      Result Value   Glucose, Bld 139 (*)    BUN 24 (*)    All other components within normal limits  BRAIN NATRIURETIC PEPTIDE - Abnormal; Notable for the following components:   B Natriuretic Peptide 301.0 (*)    All other components within normal limits  D-DIMER, QUANTITATIVE - Abnormal; Notable for the following components:   D-Dimer, Quant 1.19 (*)    All other components within normal limits  RESP PANEL BY RT-PCR (FLU A&B, COVID) ARPGX2  CBC  HEPATIC FUNCTION PANEL  TROPONIN I (HIGH SENSITIVITY)  TROPONIN I (HIGH SENSITIVITY)    EKG EKG Interpretation  Date/Time:  Friday March 01 2021 13:06:47 EDT Ventricular Rate:  84 PR Interval:  126 QRS Duration: 88 QT Interval:  380 QTC Calculation: 449 R Axis:   38 Text Interpretation: Normal sinus rhythm Nonspecific ST and T wave abnormality Abnormal ECG No significant change since last tracing Confirmed by Isla Pence [id number]) on 03/01/2021 1:56:42 PM  Prior ECGs are Urgent Care just PTA:        Radiology DG Chest 2 View  Result Date: 03/01/2021 CLINICAL DATA:  Shortness of breath. EXAM: CHEST  - 2 VIEW COMPARISON:  December 31, 2015. FINDINGS: The heart size and mediastinal contours are within normal limits. Mild central pulmonary vascular congestion is noted with possible minimal bibasilar pulmonary edema. Small bilateral pleural effusions are noted. No pneumothorax is noted. The visualized skeletal structures are unremarkable. IMPRESSION: Mild central pulmonary vascular congestion is noted with possible minimal bibasilar pulmonary edema. Small bilateral pleural effusions are noted. Electronically Signed   By: Marijo Conception M.D.   On: 03/01/2021 13:57    Procedures Procedures   Medications Ordered in ED Medications  furosemide (LASIX) injection 40 mg (has no administration in time range)    ED Course  I have reviewed the triage vital signs and the nursing notes.  Pertinent labs & imaging results that were available during my care of the patient were reviewed by me and considered in my medical decision making (see chart for details).  Clinical Course as of 03/01/21 1448  Fri Mar 01, 2021  1417 DG Chest 2 View IMPRESSION: Mild central pulmonary vascular congestion is noted with possible minimal bibasilar pulmonary edema. Small bilateral pleural effusions are noted.   [LJ]  1439 B Natriuretic Peptide(!): 301.0 [LJ]  1439 D-Dimer, Quant(!): 1.19 [LJ]    Clinical Course User Index [LJ] Rayna Sexton, PA-C   MDM Rules/Calculators/A&P                          Pt is a 65 y.o. male who presents the emergency department with shortness of breath and chest tightness on and off for the past week.  Labs: CBC without abnormalities. BMP with a glucose of 139 and a BUN of 24. Hepatic function panel without abnormalities. D-dimer elevated at 1.19.  CT PE study ordered.  Troponin of 11. BNP of 301. Respiratory panel pending.  Imaging: Chest x-ray showing mild central pulmonary vascular congestion with possible minimal bibasilar pulmonary edema.  I, Rayna Sexton, PA-C,  personally reviewed and evaluated these images and lab results as part of my medical decision-making.  Patient found to be in A. fib at urgent care and sent to the emergency department due to his associated symptoms and this being new onset A. fib.  Upon arrival patient was back in normal sinus rhythm.  Patient reassessed and is now back in atrial fibrillation.  Oxygen saturations at 89%.  Patient placed on 2 L via nasal cannula.  Patient started on heparin as well as Lasix.  Elevated D-dimer so I have ordered a CT PE study which is pending.  Patient discussed with and evaluated by my attending physician as well.  Will admit to the medicine team for further work-up.  Note: Portions of this report may have been transcribed using voice recognition software. Every effort was made to ensure accuracy; however, inadvertent computerized transcription errors may be present.    Final Clinical Impression(s) / ED Diagnoses Final diagnoses:  Atrial fibrillation, unspecified type [facility])    Rx / DC Orders ED Discharge Orders    None       Rayna Sexton, PA-C 03/01/21 1448    Isla Pence, MD 03/02/21 [phone]

## 2021-03-01 NOTE — Progress Notes (Addendum)
ANTICOAGULATION CONSULT NOTE -  Pharmacy Consult for heparin >> apixaban Indication: atrial fibrillation  No Known Allergies  Patient Measurements: Height: 5\' 11"  (180.3 cm) Weight: 92.5 kg (204 lb) IBW/kg (Calculated) : 75.3 Heparin Dosing Weight: 92.5kg  Vital Signs: Temp: 98.5 F (36.9 C) (04/22 1309) Temp Source: Oral (04/22 1309) BP: 129/87 (04/22 1400) Pulse Rate: 62 (04/22 1400)  Labs: Recent Labs    03/01/21 1343  HGB 13.9  HCT 41.2  PLT 375  CREATININE 0.95  TROPONINIHS 11    Estimated Creatinine Clearance: 90.1 mL/min (by C-G formula based on SCr of 0.95 mg/dL).   Medical History: Past Medical History:  Diagnosis Date  . Chronic low back pain   . Diabetes mellitus without complication ([place])   . Fibromyalgia   . Hyperlipidemia   . Hypertension   . Vitamin D deficiency     Medications:  (Not in a hospital admission)   Assessment: Pharmacy consulted to dose apixaban in patient with new onset atrial fibrillation.  Patient is not on anticoagulation prior to admission.  CBC WNL D-Dimer 1.19  Goal of Therapy:   Monitor platelets by anticoagulation protocol: Yes   Plan:  Stop heparin infusion. Start apixaban 5 mg twice daily.  Margot Ables, PharmD Clinical Pharmacist 03/01/2021 3:04 PM

## 2021-03-02 ENCOUNTER — Inpatient Hospital Stay (HOSPITAL_COMMUNITY): Payer: Medicare Other

## 2021-03-02 DIAGNOSIS — I4891 Unspecified atrial fibrillation: Secondary | ICD-10-CM

## 2021-03-02 DIAGNOSIS — I10 Essential (primary) hypertension: Secondary | ICD-10-CM

## 2021-03-02 DIAGNOSIS — M797 Fibromyalgia: Secondary | ICD-10-CM

## 2021-03-02 DIAGNOSIS — E785 Hyperlipidemia, unspecified: Secondary | ICD-10-CM | POA: Diagnosis not present

## 2021-03-02 DIAGNOSIS — E1169 Type 2 diabetes mellitus with other specified complication: Secondary | ICD-10-CM | POA: Diagnosis not present

## 2021-03-02 DIAGNOSIS — I5033 Acute on chronic diastolic (congestive) heart failure: Secondary | ICD-10-CM

## 2021-03-02 LAB — GLUCOSE, CAPILLARY
Glucose-Capillary: 167 mg/dL — ABNORMAL HIGH (ref 70–99)
Glucose-Capillary: 197 mg/dL — ABNORMAL HIGH (ref 70–99)
Glucose-Capillary: 287 mg/dL — ABNORMAL HIGH (ref 70–99)

## 2021-03-02 LAB — BASIC METABOLIC PANEL
Anion gap: 11 (ref 5–15)
BUN: 28 mg/dL — ABNORMAL HIGH (ref 8–23)
CO2: 25 mmol/L (ref 22–32)
Calcium: 9.4 mg/dL (ref 8.9–10.3)
Chloride: 101 mmol/L (ref 98–111)
Creatinine, Ser: 1.07 mg/dL (ref 0.61–1.24)
GFR, Estimated: >60 mL/min (ref >60)
Glucose, Bld: 153 mg/dL — ABNORMAL HIGH (ref 70–99)
Potassium: 4.2 mmol/L (ref 3.5–5.1)
Sodium: 137 mmol/L (ref 135–145)

## 2021-03-02 LAB — ECHOCARDIOGRAM COMPLETE
Height: 71 in
MV M vel: 3.6 m/s
MV Peak grad: 51.8 mmHg
Radius: 0.7 cm
S' Lateral: 4.47 cm
Weight: 3107.6 oz

## 2021-03-02 LAB — MAGNESIUM: Magnesium: 2.1 mg/dL (ref 1.7–2.4)

## 2021-03-02 LAB — T4, FREE: Free T4: 1.12 ng/dL (ref 0.61–1.12)

## 2021-03-02 LAB — TSH: TSH: 4.498 u[IU]/mL (ref 0.350–4.500)

## 2021-03-02 MED ORDER — ALBUTEROL SULFATE (2.5 MG/3ML) 0.083% IN NEBU
2.5000 mg | INHALATION_SOLUTION | Freq: Two times a day (BID) | RESPIRATORY_TRACT | Status: DC
  Administered 2021-03-02 – 2021-03-07 (×10): 2.5 mg via RESPIRATORY_TRACT
  Filled 2021-03-02 (×10): qty 3

## 2021-03-02 MED ORDER — GUAIFENESIN ER 600 MG PO TB12
600.0000 mg | ORAL_TABLET | Freq: Two times a day (BID) | ORAL | Status: DC
  Administered 2021-03-02 – 2021-03-07 (×12): 600 mg via ORAL
  Filled 2021-03-02 (×12): qty 1

## 2021-03-02 MED ORDER — PANTOPRAZOLE SODIUM 40 MG PO TBEC
40.0000 mg | DELAYED_RELEASE_TABLET | Freq: Every day | ORAL | Status: DC
  Administered 2021-03-02 – 2021-03-07 (×6): 40 mg via ORAL
  Filled 2021-03-02 (×6): qty 1

## 2021-03-02 MED ORDER — ALBUTEROL SULFATE (2.5 MG/3ML) 0.083% IN NEBU: 2.5000 mg | INHALATION_SOLUTION | RESPIRATORY_TRACT | Status: DC | PRN

## 2021-03-02 MED ORDER — ALBUTEROL SULFATE (2.5 MG/3ML) 0.083% IN NEBU
2.5000 mg | INHALATION_SOLUTION | RESPIRATORY_TRACT | Status: DC | PRN
  Filled 2021-03-02: qty 3

## 2021-03-02 MED ORDER — INSULIN ASPART 100 UNIT/ML ~~LOC~~ SOLN
0.0000 [IU] | Freq: Three times a day (TID) | SUBCUTANEOUS | Status: DC
Start: 2021-03-02 — End: 2021-03-05
  Administered 2021-03-02: 1 [IU] via SUBCUTANEOUS
  Administered 2021-03-03 (×2): 2 [IU] via SUBCUTANEOUS
  Administered 2021-03-03: 4 [IU] via SUBCUTANEOUS
  Administered 2021-03-04 (×2): 2 [IU] via SUBCUTANEOUS
  Administered 2021-03-04: 4 [IU] via SUBCUTANEOUS

## 2021-03-02 MED ORDER — INSULIN ASPART 100 UNIT/ML ~~LOC~~ SOLN
0.0000 [IU] | Freq: Every day | SUBCUTANEOUS | Status: DC
  Administered 2021-03-02 – 2021-03-05 (×4): 3 [IU] via SUBCUTANEOUS

## 2021-03-02 MED ORDER — METHYLPREDNISOLONE SODIUM SUCC 40 MG IJ SOLR
40.0000 mg | Freq: Two times a day (BID) | INTRAMUSCULAR | Status: AC
  Administered 2021-03-02 – 2021-03-04 (×4): 40 mg via INTRAVENOUS
  Filled 2021-03-02 (×4): qty 1

## 2021-03-02 NOTE — Progress Notes (Addendum)
   03/02/21 1049  Vitals  BP 101/73  MAP (mmHg) 80  BP Location Left Arm  BP Method Automatic  Patient Position (if appropriate) Lying  Pulse Rate 70  Resp 20  MEWS COLOR  MEWS Score Color Green  Oxygen Therapy  SpO2 92 %  O2 Device Nasal Cannula  O2 Flow Rate (L/min) 2 L/min  MEWS Score  MEWS Temp 0  MEWS Systolic 0  MEWS Pulse 0  MEWS RR 0  MEWS LOC 0  MEWS Score 0  Dyspnea on exertion episode post vitals, MD Courage made aware.

## 2021-03-02 NOTE — Progress Notes (Signed)
Patient complained again of chest tightness. Saturation 87. Placed on 2 liters increased to 4 liters. Saturation increased to 92 . Nurse had given lasix earlier. Patient admitted for pulmonary edema. Does have HX of smoking. No wheezes or crackles noted at this time.

## 2021-03-02 NOTE — Progress Notes (Signed)
Patient Demographics:    Michael Valenzuela, is a 65 y.o. male, DOB - 1956/03/16, NLZ:[id number]  Admit date - 03/01/2021   Admitting Physician Truett Mainland, DO  Outpatient Primary MD for the patient is Chevis Pretty, [place]  LOS - 1   Chief Complaint  Patient presents with  . Shortness of Breath        Subjective:    [name] today has no fevers, no emesis,  No chest pain,   -Episodes of dyspnea on exertion and desaturation persist -  Assessment  & Plan :    Active Problems:   Fibromyalgia   Essential hypertension, benign   Hyperlipidemia with target LDL less than 100   Type 2 diabetes mellitus with other specified complication (HCC)   Pleural effusion   Atrial fibrillation [facility])   Brief Summary:- 65 y.o. male with a history of diabetes, fibromyalgia, hyperlipidemia and hypertension admitted on 03/01/2021 with acute hypoxic respiratory failure due to CHF and bronchitis   A/p 1)Acute hypoxic respiratory failure----multifactorial etiology -Continue oxygen supplementation via nasal cannula at 2 to 3 L/min -No pulmonary embolism on CTA chest -Troponin is not elevated -CHF findings with pulmonary edema, moderate bilateral effusions with adjacent compressive atelectasis noted, patient also has groundglass opacities mostly in the right lung suggestive of possible infection versus alveolar edema as well as bronchial wall thickening suggestive of bronchitis -BNP 301, no baseline available -Diuresing well with IV Lasix, monitor electrolytes and daily weight -Treat empirically with steroids, bronchodilators and azithromycin for presumed bronchitis with possible bronchial spasms -Patient without fevers or leukocytosis -Echo pending  2)PAFIb-Echo pending, TSH WNL  CHA2DS2- VASc score   is = 4 (age, HTN, DM and CHF)   Which is  equal to =  4.8 % annual risk of stroke  -Potassium and magnesium  WNL -Continue Eliquis for stroke prophylaxis -Continue metoprolol 25 mg twice daily for rate control  3) polypharmacy----patient is on amlodipine so we will stop felodipine -Patient is on Lasix so we will stop HCTZ -Monitor BP and electrolytes closely  4)HLD--continue Crestor and fenofibrate, recent lipid profile with LDL around 70 and HDL around 40  5)DM2-continue Levemir 15 units nightly,  -Hold metformin and Januvia Use Novolog/Humalog Sliding scale insulin with Accu-Cheks/Fingersticks as ordered   Disposition/Need for in-Hospital Stay- patient unable to be discharged at this time due to --acute CHF requiring IV diuresis acute hypoxic respiratory failure requiring supplemental oxygen*  Status is: Inpatient  Remains inpatient appropriate because:Please see above   Disposition: The patient is from: Home              Anticipated d/c is to: Home              Anticipated d/c date is: 2 days              Patient currently is not medically stable to d/c. Barriers: Not Clinically Stable-  Code Status :  -  Code Status: Full Code   Family Communication:    NA (patient is alert, awake and coherent)   Consults  :  na  DVT Prophylaxis  :   - SCDs  apixaban (ELIQUIS) tablet 5 mg    Lab Results  Component Value Date   PLT 375 03/01/2021  Inpatient Medications  Scheduled Meds: . amLODipine  10 mg Oral Daily  . apixaban  5 mg Oral BID  . aspirin EC  81 mg Oral Daily  . fenofibrate  160 mg Oral Daily  . furosemide  20 mg Intravenous Q12H  . insulin detemir  15 Units Subcutaneous QHS  . methylPREDNISolone (SOLU-MEDROL) injection  40 mg Intravenous Q12H  . metoprolol tartrate  25 mg Oral BID  . oxyCODONE  15 mg Oral Q6H  . potassium chloride  40 mEq Oral BID  . rosuvastatin  10 mg Oral Daily  . sodium chloride flush  3 mL Intravenous Q12H   Continuous Infusions: . sodium chloride     PRN Meds:.sodium chloride, acetaminophen, albuterol, cyclobenzaprine, ondansetron  (ZOFRAN) IV, sodium chloride flush    Anti-infectives (From admission, onward)   None        Objective:   Vitals:   03/02/21 0521 03/02/21 0731 03/02/21 0900 03/02/21 1049  BP: 111/86 108/85  101/73  Pulse: 72 62 68 70  Resp: 18   20  Temp: 98 F (36.7 C)     TempSrc: Oral     SpO2: 94% 92% 97% 92%  Weight:      Height:        Wt Readings from Last 3 Encounters:  03/02/21 88.1 kg  12/28/20 89.1 kg  09/27/20 89.8 kg     Intake/Output Summary (Last 24 hours) at 03/02/2021 1401 Last data filed at 03/02/2021 1300 Gross per 24 hour  Intake 508.61 ml  Output 2500 ml  Net -1991.39 ml     Physical Exam  Gen:- Awake Alert,  In no apparent distress  HEENT:- [place].AT, No sclera icterus Nose- [place] 2L/min Neck-Supple Neck,No JVD,.  Lungs-diminished breath sounds with faint bibasilar rales, few wheezes in upper lobes CV- S1, S2 normal, regular  Abd-  +ve B.Sounds, Abd Soft, No tenderness,    Extremity/Skin:-   pedal pulses present = Psych-affect is appropriate, oriented x3 Neuro-no new focal deficits, no tremors   Data Review:   Micro Results Recent Results (from the past 240 hour(s))  Resp Panel by RT-PCR (Flu A&B, Covid) Nasopharyngeal Swab     Status: None   Collection Time: 03/01/21  3:05 PM   Specimen: Nasopharyngeal Swab; Nasopharyngeal(NP) swabs in vial transport medium  Result Value Ref Range Status   SARS Coronavirus 2 by RT PCR NEGATIVE NEGATIVE Final    Comment: (NOTE) SARS-CoV-2 target nucleic acids are NOT DETECTED.  The SARS-CoV-2 RNA is generally detectable in upper respiratory specimens during the acute phase of infection. The lowest concentration of SARS-CoV-2 viral copies this assay can detect is 138 copies/mL. A negative result does not preclude SARS-Cov-2 infection and should not be used as the sole basis for treatment or other patient management decisions. A negative result may occur with  improper specimen collection/handling, submission of  specimen other than nasopharyngeal swab, presence of viral mutation(s) within the areas targeted by this assay, and inadequate number of viral copies(<138 copies/mL). A negative result must be combined with clinical observations, patient history, and epidemiological information. The expected result is Negative.  Fact Sheet for Patients:  EntrepreneurPulse.com.au  Fact Sheet for Healthcare Providers:  IncredibleEmployment.be  This test is no t yet approved or cleared by the [place] FDA and  has been authorized for detection and/or diagnosis of SARS-CoV-2 by FDA under an Emergency Use Authorization (EUA). This EUA will remain  in effect (meaning this test can be used) for the duration of the  COVID-19 declaration under Section 564(b)(1) of the Act, 21 U.S.C.section 360bbb-3(b)(1), unless the authorization is terminated  or revoked sooner.       Influenza A by PCR NEGATIVE NEGATIVE Final   Influenza B by PCR NEGATIVE NEGATIVE Final    Comment: (NOTE) The Xpert Xpress SARS-CoV-2/FLU/RSV plus assay is intended as an aid in the diagnosis of influenza from Nasopharyngeal swab specimens and should not be used as a sole basis for treatment. Nasal washings and aspirates are unacceptable for Xpert Xpress SARS-CoV-2/FLU/RSV testing.  Fact Sheet for Patients: EntrepreneurPulse.com.au  Fact Sheet for Healthcare Providers: IncredibleEmployment.be  This test is not yet approved or cleared by the [place] FDA and has been authorized for detection and/or diagnosis of SARS-CoV-2 by FDA under an Emergency Use Authorization (EUA). This EUA will remain in effect (meaning this test can be used) for the duration of the COVID-19 declaration under Section 564(b)(1) of the Act, 21 U.S.C. section 360bbb-3(b)(1), unless the authorization is terminated or revoked.  Performed at [facility], [address]  [place], [place] [address]     Radiology Reports DG Chest 2 View  Result Date: 03/01/2021 CLINICAL DATA:  Shortness of breath. EXAM: CHEST - 2 VIEW COMPARISON:  December 31, 2015. FINDINGS: The heart size and mediastinal contours are within normal limits. Mild central pulmonary vascular congestion is noted with possible minimal bibasilar pulmonary edema. Small bilateral pleural effusions are noted. No pneumothorax is noted. The visualized skeletal structures are unremarkable. IMPRESSION: Mild central pulmonary vascular congestion is noted with possible minimal bibasilar pulmonary edema. Small bilateral pleural effusions are noted. Electronically Signed   By: Marijo Conception M.D.   On: 03/01/2021 13:57   CT Angio Chest PE W and/or Wo Contrast  Result Date: 03/01/2021 CLINICAL DATA:  PE suspected, high prob Shortness of breath. EXAM: CT ANGIOGRAPHY CHEST WITH CONTRAST TECHNIQUE: Multidetector CT imaging of the chest was performed using the standard protocol during bolus administration of intravenous contrast. Multiplanar CT image reconstructions and MIPs were obtained to evaluate the vascular anatomy. CONTRAST:  159mL OMNIPAQUE IOHEXOL 350 MG/ML SOLN COMPARISON:  Radiograph earlier today. FINDINGS: Cardiovascular: There are no filling defects within the pulmonary arteries to suggest pulmonary embolus. Mild aortic atherosclerosis without aneurysm or dissection. Mild cardiomegaly. Coronary artery and mitral annulus calcifications. No pericardial effusion. Mediastinum/Nodes: Mm anterior paratracheal node, series 4, image 45. Minimal soft tissue density at the right hilum likely small lymph nodes. Small hiatal hernia. No visualized thyroid nodule. Lungs/Pleura: Moderate bilateral pleural effusions with adjacent compressive atelectasis. Scattered smooth septal thickening consistent with pulmonary edema. There are peribronchovascular ground-glass opacities in the right perihilar lung and right upper lobe. Associated  right greater than left bronchial wall thickening. Upper Abdomen: Water density lesion in the left lobe of the liver is typically cyst. No acute findings in the included upper abdomen. Musculoskeletal: Mild thoracic spondylosis. There are no acute or suspicious osseous abnormalities. Review of the MIP images confirms the above findings. IMPRESSION: 1. No pulmonary embolus. 2. Moderate bilateral pleural effusions with adjacent compressive atelectasis. Smooth septal thickening consistent with pulmonary edema. 3. Peribronchial ground-glass opacities in the right perihilar lung and right upper lobe. Differential considerations include asymmetric alveolar edema, infectious or inflammatory etiology. Consider follow-up after resolution of symptoms to assess for resolution. 4. Bronchial wall thickening, can be seen with bronchitis or congestive. 5. Mild cardiomegaly. Coronary artery calcifications. 6. Small hiatal hernia. Aortic Atherosclerosis (ICD10-I70.0). Electronically Signed   By: Keith Rake M.D.   On: 03/01/2021 17:05  CBC Recent Labs  Lab 03/01/21 1343  WBC 10.0  HGB 13.9  HCT 41.2  PLT 375  MCV 90.0  MCH 30.3  MCHC 33.7  RDW 12.5    Chemistries  Recent Labs  Lab 03/01/21 1343 03/02/21 0512  NA 136 137  K 3.9 4.2  CL 104 101  CO2 22 25  GLUCOSE 139* 153*  BUN 24* 28*  CREATININE 0.95 1.07  CALCIUM 9.3 9.4  MG  --  2.1  AST 26  --   ALT 20  --   ALKPHOS 48  --   BILITOT 0.7  --    ------------------------------------------------------------------------------------------------------------------ No results for input(s): CHOL, HDL, LDLCALC, TRIG, CHOLHDL, LDLDIRECT in the last 72 hours.  Lab Results  Component Value Date   HGBA1C 6.4 12/28/2020   ------------------------------------------------------------------------------------------------------------------ Recent Labs    03/02/21 0513  TSH 4.498    ------------------------------------------------------------------------------------------------------------------ No results for input(s): VITAMINB12, FOLATE, FERRITIN, TIBC, IRON, RETICCTPCT in the last 72 hours.  Coagulation profile No results for input(s): INR, PROTIME in the last 168 hours.  Recent Labs    03/01/21 1343  DDIMER 1.19*    Cardiac Enzymes No results for input(s): CKMB, TROPONINI, MYOGLOBIN in the last 168 hours.  Invalid input(s): CK ------------------------------------------------------------------------------------------------------------------    Component Value Date/Time   BNP 301.0 (H) 03/01/2021 1343     Roxan Hockey M.D on 03/02/2021 at 2:01 PM  Go to [email or web address] - for contact info  [facility] - Office  [phone]

## 2021-03-02 NOTE — Progress Notes (Signed)
  Echocardiogram 2D Echocardiogram has been performed.  Michael Valenzuela 03/02/2021, 3:08 PM

## 2021-03-03 ENCOUNTER — Inpatient Hospital Stay (HOSPITAL_COMMUNITY): Payer: Medicare Other

## 2021-03-03 DIAGNOSIS — I5033 Acute on chronic diastolic (congestive) heart failure: Secondary | ICD-10-CM | POA: Diagnosis present

## 2021-03-03 DIAGNOSIS — I341 Nonrheumatic mitral (valve) prolapse: Secondary | ICD-10-CM | POA: Diagnosis present

## 2021-03-03 LAB — GLUCOSE, CAPILLARY
Glucose-Capillary: 207 mg/dL — ABNORMAL HIGH (ref 70–99)
Glucose-Capillary: 316 mg/dL — ABNORMAL HIGH (ref 70–99)
Glucose-Capillary: 242 mg/dL — ABNORMAL HIGH (ref 70–99)
Glucose-Capillary: 284 mg/dL — ABNORMAL HIGH (ref 70–99)

## 2021-03-03 LAB — BASIC METABOLIC PANEL
Anion gap: 11 (ref 5–15)
BUN: 29 mg/dL — ABNORMAL HIGH (ref 8–23)
CO2: 21 mmol/L — ABNORMAL LOW (ref 22–32)
Calcium: 9.4 mg/dL (ref 8.9–10.3)
Chloride: 102 mmol/L (ref 98–111)
Creatinine, Ser: 0.78 mg/dL (ref 0.61–1.24)
GFR, Estimated: >60 mL/min (ref >60)
Glucose, Bld: 189 mg/dL — ABNORMAL HIGH (ref 70–99)
Potassium: 4.1 mmol/L (ref 3.5–5.1)
Sodium: 134 mmol/L — ABNORMAL LOW (ref 135–145)

## 2021-03-03 MED ORDER — FUROSEMIDE 10 MG/ML IJ SOLN
40.0000 mg | Freq: Every day | INTRAMUSCULAR | Status: DC
  Administered 2021-03-03 – 2021-03-07 (×5): 40 mg via INTRAVENOUS
  Filled 2021-03-03 (×5): qty 4

## 2021-03-03 MED ORDER — FUROSEMIDE 10 MG/ML IJ SOLN
20.0000 mg | Freq: Every evening | INTRAMUSCULAR | Status: DC
  Administered 2021-03-03: 20 mg via INTRAVENOUS
  Filled 2021-03-03: qty 2

## 2021-03-03 MED ORDER — APIXABAN 5 MG PO TABS: 5.0000 mg | ORAL_TABLET | Freq: Two times a day (BID) | ORAL | Status: DC

## 2021-03-03 MED ORDER — TRAZODONE HCL 100 MG PO TABS
100.0000 mg | ORAL_TABLET | Freq: Every day | ORAL | Status: DC
  Administered 2021-03-03 – 2021-03-07 (×5): 100 mg via ORAL
  Filled 2021-03-03: qty 2
  Filled 2021-03-03 (×4): qty 1

## 2021-03-03 MED ORDER — INSULIN DETEMIR 100 UNIT/ML ~~LOC~~ SOLN
15.0000 [IU] | Freq: Every day | SUBCUTANEOUS | Status: DC
  Filled 2021-03-03: qty 0.15

## 2021-03-03 NOTE — Progress Notes (Signed)
Patient Demographics:    Michael Valenzuela, is a 65 y.o. male, DOB - November 09, 1956, [id number]  Admit date - 03/01/2021   Admitting Physician Truett Mainland, DO  Outpatient Primary MD for the patient is [facility]  LOS - 2   Chief Complaint  Patient presents with  . Shortness of Breath        Subjective:    Michael Valenzuela today has no fevers, no emesis,  No chest pain,   No significant dyspnea at rest, dyspnea on exertion persist, hypoxia resolved -  Assessment  & Plan :    Principal Problem:   Acute on chronic heart failure with preserved ejection fraction (HFpEF)/in the setting of severe mitral valve prolapse Active Problems:   Mitral valve posterior leaflet prolapse/-severe holosystolic prolapse of posterior leaflet of the mitral valve   Essential hypertension, benign   Type 2 diabetes mellitus with other specified complication (HCC)   Atrial fibrillation (HCC)   Fibromyalgia   Hyperlipidemia with target LDL less than 100   Pleural effusion   Brief Summary:- 65 y.o. male with a history of diabetes, fibromyalgia, hyperlipidemia and hypertension admitted on 03/01/2021 with acute hypoxic respiratory failure due to HFpEF CHF and bronchitis   A/p 1)Acute hypoxic respiratory failure----multifactorial etiology No further hypoxia at rest, requires O2 as needed with activity now -No pulmonary embolism on CTA chest -Troponin is not elevated -CHF findings with pulmonary edema, moderate bilateral effusions with adjacent compressive atelectasis noted, patient also has groundglass opacities mostly in the right lung suggestive of possible infection versus alveolar edema as well as bronchial wall thickening suggestive of bronchitis -BNP 301, no baseline available -Diuresing well with IV Lasix, monitor electrolytes and daily weight -Treat empirically with steroids, bronchodilators and azithromycin  for presumed bronchitis with possible bronchial spasms -Patient without fevers or leukocytosis -Echo pending  2)PAFIb-Echo pending, TSH WNL  CHA2DS2- VASc score   is = 4 (age, HTN, DM and CHF)   Which is  equal to =  4.8 % annual risk of stroke  -Potassium and magnesium WNL -Continue Eliquis for stroke prophylaxis -Continue metoprolol 25 mg twice daily for rate control  3) acute on chronic  HFpEF--- in the setting of severe mitral valve prolapse -IV Lasix for diuresis, daily weight and fluid input and output monitoring -Cardiology consult for possible TEE to further evaluate the mitral valve  4)Severe Mitral Valve Prolapse--- echo from 03/02/2021 showed severe holosystolic prolapse of posterior leaflet of the mitral valve -Cardiology consult for possible TEE pending  5)Polypharmacy----patient is on amlodipine so we will stop felodipine -Patient is on Lasix so we will stop HCTZ -Monitor BP and electrolytes closely  6)HLD--continue Crestor and fenofibrate, recent lipid profile with LDL around 70 and HDL around 40  7)DM2-continue Levemir 15 units nightly (hold tonight's dose as patient will be n.p.o. for possible TEE) -Hold metformin and Januvia Use Novolog/Humalog Sliding scale insulin with Accu-Cheks/Fingersticks as ordered   Disposition/Need for in-Hospital Stay- patient unable to be discharged at this time due to --acute CHF requiring IV diuresis acute hypoxic respiratory failure requiring supplemental oxygen*  Status is: Inpatient  Remains inpatient appropriate because:Please see above   Disposition: The patient is from: Home  Anticipated d/c is to: Home              Anticipated d/c date is: 2 days              Patient currently is not medically stable to d/c. Barriers: Not Clinically Stable-  Code Status :  -  Code Status: Full Code   Family Communication:    NA (patient is alert, awake and coherent)   Consults  :  na  DVT Prophylaxis  :   - SCDs  apixaban  (ELIQUIS) tablet 5 mg    Lab Results  Component Value Date   PLT 375 03/01/2021    Inpatient Medications  Scheduled Meds: . albuterol  2.5 mg Nebulization BID  . amLODipine  10 mg Oral Daily  . apixaban  5 mg Oral BID  . fenofibrate  160 mg Oral Daily  . furosemide  20 mg Intravenous QPM  . furosemide  40 mg Intravenous Daily  . guaiFENesin  600 mg Oral BID  . insulin aspart  0-5 Units Subcutaneous QHS  . insulin aspart  0-6 Units Subcutaneous TID WC  . insulin detemir  15 Units Subcutaneous QHS  . methylPREDNISolone (SOLU-MEDROL) injection  40 mg Intravenous Q12H  . metoprolol tartrate  25 mg Oral BID  . oxyCODONE  15 mg Oral Q6H  . pantoprazole  40 mg Oral Daily  . potassium chloride  40 mEq Oral BID  . rosuvastatin  10 mg Oral Daily  . sodium chloride flush  3 mL Intravenous Q12H  . traZODone  100 mg Oral QHS   Continuous Infusions: . sodium chloride     PRN Meds:.sodium chloride, acetaminophen, albuterol, cyclobenzaprine, ondansetron (ZOFRAN) IV, sodium chloride flush    Anti-infectives (From admission, onward)   None        Objective:   Vitals:   03/03/21 0504 03/03/21 0724 03/03/21 0920 03/03/21 1454  BP: 116/75  (!) 125/109 132/85  Pulse: 77  92 74  Resp: 18  18 20   Temp: 98.4 F (36.9 C)  98.1 F (36.7 C) 98.7 F (37.1 C)  TempSrc: Oral  Oral Oral  SpO2: 94% (!) 88% 96% 93%  Weight: 87.4 kg     Height:        Wt Readings from Last 3 Encounters:  03/03/21 87.4 kg  12/28/20 89.1 kg  09/27/20 89.8 kg    Intake/Output Summary (Last 24 hours) at 03/03/2021 1632 Last data filed at 03/03/2021 1552 Gross per 24 hour  Intake 1030 ml  Output 1950 ml  Net -920 ml    Physical Exam  Gen:- Awake Alert,  In no apparent distress  HEENT:- [place].AT, No sclera icterus Nose- [place] 2L/min Neck-Supple Neck,No JVD,.  Lungs-diminished breath sounds with faint bibasilar rales, few wheezes in upper lobes CV- S1, S2 normal, irregular , 3/6 SM Abd-  +ve B.Sounds,  Abd Soft, No tenderness,    Extremity/Skin:-No significant pitting edema, pedal pulses present = Psych-affect is appropriate, oriented x3 Neuro-no new focal deficits, no tremors   Data Review:   Micro Results Recent Results (from the past 240 hour(s))  Resp Panel by RT-PCR (Flu A&B, Covid) Nasopharyngeal Swab     Status: None   Collection Time: 03/01/21  3:05 PM   Specimen: Nasopharyngeal Swab; Nasopharyngeal(NP) swabs in vial transport medium  Result Value Ref Range Status   SARS Coronavirus 2 by RT PCR NEGATIVE NEGATIVE Final    Comment: (NOTE) SARS-CoV-2 target nucleic acids are NOT DETECTED.  The SARS-CoV-2  RNA is generally detectable in upper respiratory specimens during the acute phase of infection. The lowest concentration of SARS-CoV-2 viral copies this assay can detect is 138 copies/mL. A negative result does not preclude SARS-Cov-2 infection and should not be used as the sole basis for treatment or other patient management decisions. A negative result may occur with  improper specimen collection/handling, submission of specimen other than nasopharyngeal swab, presence of viral mutation(s) within the areas targeted by this assay, and inadequate number of viral copies(<138 copies/mL). A negative result must be combined with clinical observations, patient history, and epidemiological information. The expected result is Negative.  Fact Sheet for Patients:  EntrepreneurPulse.com.au  Fact Sheet for Healthcare Providers:  IncredibleEmployment.be  This test is no t yet approved or cleared by the [place] FDA and  has been authorized for detection and/or diagnosis of SARS-CoV-2 by FDA under an Emergency Use Authorization (EUA). This EUA will remain  in effect (meaning this test can be used) for the duration of the COVID-19 declaration under Section 564(b)(1) of the Act, 21 U.S.C.section 360bbb-3(b)(1), unless the authorization is  terminated  or revoked sooner.       Influenza A by PCR NEGATIVE NEGATIVE Final   Influenza B by PCR NEGATIVE NEGATIVE Final    Comment: (NOTE) The Xpert Xpress SARS-CoV-2/FLU/RSV plus assay is intended as an aid in the diagnosis of influenza from Nasopharyngeal swab specimens and should not be used as a sole basis for treatment. Nasal washings and aspirates are unacceptable for Xpert Xpress SARS-CoV-2/FLU/RSV testing.  Fact Sheet for Patients: EntrepreneurPulse.com.au  Fact Sheet for Healthcare Providers: IncredibleEmployment.be  This test is not yet approved or cleared by the [place] FDA and has been authorized for detection and/or diagnosis of SARS-CoV-2 by FDA under an Emergency Use Authorization (EUA). This EUA will remain in effect (meaning this test can be used) for the duration of the COVID-19 declaration under Section 564(b)(1) of the Act, 21 U.S.C. section 360bbb-3(b)(1), unless the authorization is terminated or revoked.  Performed at [facility], [address] [place], [place] [address]     Radiology Reports DG Chest 2 View  Result Date: 03/03/2021 CLINICAL DATA:  Shortness of breath. EXAM: CHEST - 2 VIEW COMPARISON:  03/01/2021 FINDINGS: Normal heart size. Small pleural effusions and diffuse pulmonary vascular congestion identified compatible with CHF. No airspace opacities. IMPRESSION: No change in CHF pattern. Electronically Signed   By: Kerby Moors M.D.   On: 03/03/2021 09:38   DG Chest 2 View  Result Date: 03/01/2021 CLINICAL DATA:  Shortness of breath. EXAM: CHEST - 2 VIEW COMPARISON:  December 31, 2015. FINDINGS: The heart size and mediastinal contours are within normal limits. Mild central pulmonary vascular congestion is noted with possible minimal bibasilar pulmonary edema. Small bilateral pleural effusions are noted. No pneumothorax is noted. The visualized skeletal structures are unremarkable. IMPRESSION:  Mild central pulmonary vascular congestion is noted with possible minimal bibasilar pulmonary edema. Small bilateral pleural effusions are noted. Electronically Signed   By: Marijo Conception M.D.   On: 03/01/2021 13:57   CT Angio Chest PE W and/or Wo Contrast  Result Date: 03/01/2021 CLINICAL DATA:  PE suspected, high prob Shortness of breath. EXAM: CT ANGIOGRAPHY CHEST WITH CONTRAST TECHNIQUE: Multidetector CT imaging of the chest was performed using the standard protocol during bolus administration of intravenous contrast. Multiplanar CT image reconstructions and MIPs were obtained to evaluate the vascular anatomy. CONTRAST:  1105mL OMNIPAQUE IOHEXOL 350 MG/ML SOLN COMPARISON:  Radiograph earlier today.  FINDINGS: Cardiovascular: There are no filling defects within the pulmonary arteries to suggest pulmonary embolus. Mild aortic atherosclerosis without aneurysm or dissection. Mild cardiomegaly. Coronary artery and mitral annulus calcifications. No pericardial effusion. Mediastinum/Nodes: Mm anterior paratracheal node, series 4, image 45. Minimal soft tissue density at the right hilum likely small lymph nodes. Small hiatal hernia. No visualized thyroid nodule. Lungs/Pleura: Moderate bilateral pleural effusions with adjacent compressive atelectasis. Scattered smooth septal thickening consistent with pulmonary edema. There are peribronchovascular ground-glass opacities in the right perihilar lung and right upper lobe. Associated right greater than left bronchial wall thickening. Upper Abdomen: Water density lesion in the left lobe of the liver is typically cyst. No acute findings in the included upper abdomen. Musculoskeletal: Mild thoracic spondylosis. There are no acute or suspicious osseous abnormalities. Review of the MIP images confirms the above findings. IMPRESSION: 1. No pulmonary embolus. 2. Moderate bilateral pleural effusions with adjacent compressive atelectasis. Smooth septal thickening consistent with  pulmonary edema. 3. Peribronchial ground-glass opacities in the right perihilar lung and right upper lobe. Differential considerations include asymmetric alveolar edema, infectious or inflammatory etiology. Consider follow-up after resolution of symptoms to assess for resolution. 4. Bronchial wall thickening, can be seen with bronchitis or congestive. 5. Mild cardiomegaly. Coronary artery calcifications. 6. Small hiatal hernia. Aortic Atherosclerosis (ICD10-I70.0). Electronically Signed   By: Keith Rake M.D.   On: 03/01/2021 17:05   ECHOCARDIOGRAM COMPLETE  Result Date: 03/02/2021    ECHOCARDIOGRAM REPORT   Patient Name:   Michael Valenzuela Date of Exam: 03/02/2021 Medical Rec #:  [id number]  Height:       71.0 in Accession #:    [id number] Weight:       194.2 lb Date of Birth:  [birth year]  BSA:          2.082 m Patient Age:    76 years   BP:           101/73 mmHg Patient Gender: M          HR:           75 bpm. Exam Location:  [facility] Procedure: 2D Echo, Cardiac Doppler and Color Doppler Indications:    Atrial Fibrillation I48.91, Congestive Heart Failure I50.9  History:        Patient has no prior history of Echocardiogram examinations.                 Risk Factors:Hypertension, Diabetes and Dyslipidemia.  Sonographer:    Bernadene Person RDCS Referring Phys: [place]  1. Probable flail posterior MV leaflet; MR is eccentric and difficult to quantitate; at least moderate and likely severe; suggest TEE to further assess.  2. Left ventricular ejection fraction, by estimation, is 70 to 75%. The left ventricle has hyperdynamic function. The left ventricle has no regional wall motion abnormalities. The left ventricular internal cavity size was mildly dilated. Left ventricular diastolic function could not be evaluated.  3. Right ventricular systolic function is normal. The right ventricular size is normal. Tricuspid regurgitation signal is inadequate for assessing PA pressure.  4. Left atrial  size was severely dilated.  5. The mitral valve is normal in structure. No evidence of mitral valve regurgitation. No evidence of mitral stenosis. 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  6. The aortic valve is tricuspid. Aortic valve regurgitation is not visualized. Mild aortic valve sclerosis is present, with no evidence of aortic valve stenosis.  7. Aortic dilatation noted. There is  borderline dilatation of the ascending aorta, measuring 38 mm.  8. The inferior vena cava is dilated in size with >50% respiratory variability, suggesting right atrial pressure of 8 mmHg. FINDINGS  Left Ventricle: Left ventricular ejection fraction, by estimation, is 70 to 75%. The left ventricle has hyperdynamic function. The left ventricle has no regional wall motion abnormalities. The left ventricular internal cavity size was mildly dilated. There is no left ventricular hypertrophy. Left ventricular diastolic function could not be evaluated due to atrial fibrillation. Left ventricular diastolic function could not be evaluated. Right Ventricle: The right ventricular size is normal. Right ventricular systolic function is normal. Tricuspid regurgitation signal is inadequate for assessing PA pressure. The tricuspid regurgitant velocity is 3.02 m/s, and with an assumed right atrial  pressure of 8 mmHg, the estimated right ventricular systolic pressure is 23.5 mmHg. Left Atrium: Left atrial size was severely dilated. Right Atrium: Right atrial size was normal in size. Pericardium: There is no evidence of pericardial effusion. Mitral Valve: The mitral valve is normal in structure. 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 Mild mitral annular calcification. No evidence of mitral valve regurgitation. No evidence of mitral valve stenosis. Tricuspid Valve: The tricuspid valve is normal in structure. Tricuspid valve regurgitation is trivial. No evidence of tricuspid  stenosis. Aortic Valve: The aortic valve is tricuspid. Aortic valve regurgitation is not visualized. Mild aortic valve sclerosis is present, with no evidence of aortic valve stenosis. Pulmonic Valve: The pulmonic valve was not well visualized. Pulmonic valve regurgitation is not visualized. No evidence of pulmonic stenosis. Aorta: Aortic dilatation noted. There is borderline dilatation of the ascending aorta, measuring 38 mm. Venous: The inferior vena cava is dilated in size with greater than 50% respiratory variability, suggesting right atrial pressure of 8 mmHg. IAS/Shunts: No atrial level shunt detected by color flow Doppler. Additional Comments: Probable flail posterior MV leaflet; MR is eccentric and difficult to quantitate; at least moderate and likely severe; suggest TEE to further assess.  LEFT VENTRICLE PLAX 2D LVIDd:         6.07 cm LVIDs:         4.47 cm LV PW:         1.07 cm LV IVS:        1.07 cm LVOT diam:     2.10 cm LV SV:         58 LV SV Index:   28 LVOT Area:     3.46 cm  RIGHT VENTRICLE TAPSE (M-mode): 2.2 cm LEFT ATRIUM              Index       RIGHT ATRIUM           Index LA diam:        6.30 cm  3.03 cm/m  RA Area:     14.90 cm LA Vol (A2C):   111.0 ml 53.30 ml/m RA Volume:   35.70 ml  17.14 ml/m LA Vol (A4C):   114.0 ml 54.74 ml/m LA Biplane Vol: 112.0 ml 53.78 ml/m  AORTIC VALVE LVOT Vmax:   96.67 cm/s LVOT Vmean:  65.433 cm/s LVOT VTI:    0.167 m  AORTA Ao Root diam: 3.50 cm Ao Asc diam:  3.80 cm MR Peak grad:    51.8 mmHg   TRICUSPID VALVE MR Mean grad:    33.0 mmHg   TR Peak grad:   36.5 mmHg MR Vmax:         360.00  cm/s TR Vmax:        302.00 cm/s MR Vmean:        277.0 cm/s MR PISA:         3.08 cm    SHUNTS MR PISA Eff ROA: 30 mm      Systemic VTI:  0.17 m MR PISA Radius:  0.70 cm     Systemic Diam: 2.10 cm Kirk Ruths MD Electronically signed by Kirk Ruths MD Signature Date/Time: 03/02/2021/3:18:01 PM    Final      CBC Recent Labs  Lab 03/01/21 1343  WBC 10.0   HGB 13.9  HCT 41.2  PLT 375  MCV 90.0  MCH 30.3  MCHC 33.7  RDW 12.5    Chemistries  Recent Labs  Lab 03/01/21 1343 03/02/21 0512 03/03/21 0503  NA 136 137 134*  K 3.9 4.2 4.1  CL 104 101 102  CO2 22 25 21*  GLUCOSE 139* 153* 189*  BUN 24* 28* 29*  CREATININE 0.95 1.07 0.78  CALCIUM 9.3 9.4 9.4  MG  --  2.1  --   AST 26  --   --   ALT 20  --   --   ALKPHOS 48  --   --   BILITOT 0.7  --   --    ------------------------------------------------------------------------------------------------------------------ No results for input(s): CHOL, HDL, LDLCALC, TRIG, CHOLHDL, LDLDIRECT in the last 72 hours.  Lab Results  Component Value Date   HGBA1C 6.4 12/28/2020   ------------------------------------------------------------------------------------------------------------------ Recent Labs    03/02/21 0513  TSH 4.498   ------------------------------------------------------------------------------------------------------------------ No results for input(s): VITAMINB12, FOLATE, FERRITIN, TIBC, IRON, RETICCTPCT in the last 72 hours.  Coagulation profile No results for input(s): INR, PROTIME in the last 168 hours.  Recent Labs    03/01/21 1343  DDIMER 1.19*    Cardiac Enzymes No results for input(s): CKMB, TROPONINI, MYOGLOBIN in the last 168 hours.  Invalid input(s): CK ------------------------------------------------------------------------------------------------------------------    Component Value Date/Time   BNP 301.0 (H) 03/01/2021 1343     Roxan Hockey M.D on 03/03/2021 at 4:32 PM  Go to [email or web address] - for contact info  [facility] - Office  [phone]

## 2021-03-04 DIAGNOSIS — I5033 Acute on chronic diastolic (congestive) heart failure: Secondary | ICD-10-CM | POA: Diagnosis not present

## 2021-03-04 DIAGNOSIS — R0600 Dyspnea, unspecified: Secondary | ICD-10-CM

## 2021-03-04 DIAGNOSIS — I4891 Unspecified atrial fibrillation: Secondary | ICD-10-CM | POA: Diagnosis not present

## 2021-03-04 DIAGNOSIS — R0689 Other abnormalities of breathing: Secondary | ICD-10-CM | POA: Diagnosis not present

## 2021-03-04 LAB — GLUCOSE, CAPILLARY
Glucose-Capillary: 160 mg/dL — ABNORMAL HIGH (ref 70–99)
Glucose-Capillary: 245 mg/dL — ABNORMAL HIGH (ref 70–99)
Glucose-Capillary: 227 mg/dL — ABNORMAL HIGH (ref 70–99)
Glucose-Capillary: 317 mg/dL — ABNORMAL HIGH (ref 70–99)
Glucose-Capillary: 277 mg/dL — ABNORMAL HIGH (ref 70–99)

## 2021-03-04 LAB — BASIC METABOLIC PANEL
Anion gap: 12 (ref 5–15)
BUN: 38 mg/dL — ABNORMAL HIGH (ref 8–23)
CO2: 22 mmol/L (ref 22–32)
Calcium: 9.5 mg/dL (ref 8.9–10.3)
Chloride: 101 mmol/L (ref 98–111)
Creatinine, Ser: 0.98 mg/dL (ref 0.61–1.24)
GFR, Estimated: >60 mL/min (ref >60)
Glucose, Bld: 204 mg/dL — ABNORMAL HIGH (ref 70–99)
Potassium: 4.4 mmol/L (ref 3.5–5.1)
Sodium: 135 mmol/L (ref 135–145)

## 2021-03-04 MED ORDER — ENOXAPARIN SODIUM 40 MG/0.4ML ~~LOC~~ SOLN
40.0000 mg | Freq: Once | SUBCUTANEOUS | Status: AC
  Administered 2021-03-04: 40 mg via SUBCUTANEOUS
  Filled 2021-03-04: qty 0.4

## 2021-03-04 MED ORDER — INSULIN DETEMIR 100 UNIT/ML ~~LOC~~ SOLN
15.0000 [IU] | Freq: Every day | SUBCUTANEOUS | Status: DC
  Filled 2021-03-04: qty 0.15

## 2021-03-04 MED ORDER — AMLODIPINE BESYLATE 5 MG PO TABS
5.0000 mg | ORAL_TABLET | Freq: Every day | ORAL | Status: DC
  Administered 2021-03-05 – 2021-03-07 (×3): 5 mg via ORAL
  Filled 2021-03-04 (×3): qty 1

## 2021-03-04 MED ORDER — INSULIN DETEMIR 100 UNIT/ML ~~LOC~~ SOLN
15.0000 [IU] | Freq: Once | SUBCUTANEOUS | Status: AC
  Administered 2021-03-04: 15 [IU] via SUBCUTANEOUS
  Filled 2021-03-04: qty 0.15

## 2021-03-04 MED ORDER — APIXABAN 5 MG PO TABS: 5.0000 mg | ORAL_TABLET | Freq: Two times a day (BID) | ORAL | Status: DC

## 2021-03-04 NOTE — H&P (View-Only) (Signed)
Cardiology Consultation:   Patient ID: [place] Trabert MRN: [id number]; DOB: [birth year]  Admit date: 03/01/2021 Date of Consult: 03/04/2021  PCP:  Chevis Pretty, [place]  Cardiologist:  No primary care provider on file.  Advanced Practice Provider:  No care team member to display Electrophysiologist:  None  [id number]}    Patient Profile:   Michael Valenzuela is a 65 y.o. male with a hx of DM, HTN, HLD, tobacco use, chronic back pain, & fibromyalgia who is being seen today for the evaluation of worsening shortness of breath at the request of Roxan Hockey, MD.  History of Present Illness:   Mr. Baechle with no prior cardiac history presents to the hospital with complaints of shortness of breath, worsening over the past week or so. He has been noticing difficulty in lying flat and also some chest tightness. The symptoms started rather suddenly while he was out working in the yard 1 week ago. On Sunday 02/24/2021 he felt soreness in the chest and some tightness. He thought he had aggravated his fibromyalgia. By Wednesday 02/27/21 he was having difficulty sleeping flat due to significant shortness of breath and on Friday 03/01/21 he was experiencing resting dyspnea prompting him to go to his family physician. He was documented to be in new onset AFIB and he was then sent to urgent care and eventually got admitted to the hospital. Initial O2 sat 89%. Treated with IV lasix, supplemental oxygen. Started on IV heparin initially and then transitioned to Eliquis for AFIB.  The ECG on 03/01/2021 (at 14:59), that I reviewed personally, revealed atrial fibrillation (rate 92 bpm), LVH and repolarization abnormalities. CXR showed small pleural effusions and diffuse pulmonary vascular congestion identified compatible with CHF. Hs-Trops were 11 and 11. BNP was 301. COVID screen was negative.  Since his hospitalization, he underwent a transthoracic echo that showed likely severe  eccentric MR, flail posterior MV leaflet, EF 70-75%, mildly dilated LV, severely dil LA. He has responded well to diuresis with IV lasix. He is able to lie flat now and his shortness of breath has improved significantly.   Pertinent testing Echocardiogram - 03/02/2021 1. Probable flail posterior MV leaflet; MR is eccentric and difficult to quantitate; at least moderate and likely severe; suggest TEE to further assess. 2. Left ventricular ejection fraction, by estimation, is 70 to 75%. The left ventricle has hyperdynamic function. The left ventricle has no regional wall motion abnormalities. The left ventricular internal cavity size was mildly dilated. Left ventricular diastolic function could not be evaluated. 3. Right ventricular systolic function is normal. The right ventricular size is normal. Tricuspid regurgitation signal is inadequate for assessing PA pressure. 4. Left atrial size was severely dilated. 5. The mitral valve is normal in structure. No evidence of mitral valve regurgitation. No evidence of mitral stenosis. 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 6. The aortic valve is tricuspid. Aortic valve regurgitation is not visualized. Mild aortic valve sclerosis is present, with no evidence of aortic valve stenosis. 7. Aortic dilatation noted. There is borderline dilatation of the ascending aorta, measuring 38 mm. 8. The inferior vena cava is dilated in size with >50% respiratory variability, suggesting right atrial pressure of 8 mmHg.     Past Medical History:  Diagnosis Date  . Chronic low back pain   . Diabetes mellitus without complication ([place])   . Fibromyalgia   . Hyperlipidemia   . Hypertension   . Vitamin D deficiency  Past Surgical History:  Procedure Laterality Date  . HERNIA REPAIR       Home Medications:  Prior to Admission medications   Medication Sig Start Date End Date Taking? Authorizing Provider   amLODipine (NORVASC) 10 MG tablet TAKE 1 TABLET(10 MG) BY MOUTH DAILY Patient taking differently: Take 10 mg by mouth daily. TAKE 1 TABLET(10 MG) BY MOUTH DAILY 12/28/20  Yes Hassell Done, Mary-Margaret, FNP  aspirin EC 81 MG tablet Take 1 tablet (81 mg total) by mouth daily. 09/18/14  Yes Cherre Robins, PharmD  felodipine (PLENDIL) 10 MG 24 hr tablet TAKE 1 TABLET(10 MG) BY MOUTH DAILY Patient taking differently: Take 10 mg by mouth daily. TAKE 1 TABLET(10 MG) BY MOUTH DAILY 12/28/20  Yes Hassell Done, Mary-Margaret, FNP  fenofibrate 160 MG tablet TAKE 1 TABLET(160 MG) BY MOUTH DAILY Patient taking differently: Take 160 mg by mouth daily. TAKE 1 TABLET(160 MG) BY MOUTH DAILY 12/28/20  Yes Hassell Done, Mary-Margaret, FNP  hydrochlorothiazide (HYDRODIURIL) 25 MG tablet TAKE 1 TABLET(25 MG) BY MOUTH DAILY Patient taking differently: Take 25 mg by mouth daily. TAKE 1 TABLET(25 MG) BY MOUTH DAILY 12/28/20  Yes Hassell Done, Mary-Margaret, FNP  losartan (COZAAR) 100 MG tablet TAKE 1 TABLET(100 MG) BY MOUTH DAILY 12/28/20  Yes Hassell Done, Mary-Margaret, FNP  metFORMIN (GLUCOPHAGE) 1000 MG tablet TAKE 1 TABLET(1000 MG) BY MOUTH TWICE DAILY WITH A MEAL Patient taking differently: Take 1,000 mg by mouth 2 (two) times daily with a meal. TAKE 1 TABLET(1000 MG) BY MOUTH TWICE DAILY WITH A MEAL 12/28/20  Yes Hassell Done, Mary-Margaret, FNP  oxyCODONE (ROXICODONE) 15 MG immediate release tablet Take 15 mg by mouth every 6 (six) hours as needed for pain. 01/12/19  Yes [provider]  rosuvastatin (CRESTOR) 10 MG tablet Take 1 tablet (10 mg total) by mouth daily. 12/28/20  Yes Martin, Mary-Margaret, FNP  sitaGLIPtin (JANUVIA) 100 MG tablet TAKE 1 TABLET(100 MG) BY MOUTH DAILY Patient taking differently: Take 100 mg by mouth daily. TAKE 1 TABLET(100 MG) BY MOUTH DAILY 12/28/20  Yes Hassell Done, Mary-Margaret, FNP  cyclobenzaprine (FLEXERIL) 10 MG tablet TAKE 1 TABLET BY MOUTH THREE TIMES DAILY AS NEEDED FOR MUSCLE SPASMS Patient not taking: No sig  reported 01/16/21   Chevis Pretty, FNP    Inpatient Medications: Scheduled Meds: . albuterol  2.5 mg Nebulization BID  . amLODipine  10 mg Oral Daily  . apixaban  5 mg Oral BID  . fenofibrate  160 mg Oral Daily  . furosemide  20 mg Intravenous QPM  . furosemide  40 mg Intravenous Daily  . guaiFENesin  600 mg Oral BID  . insulin aspart  0-5 Units Subcutaneous QHS  . insulin aspart  0-6 Units Subcutaneous TID WC  . insulin detemir  15 Units Subcutaneous QHS  . metoprolol tartrate  25 mg Oral BID  . oxyCODONE  15 mg Oral Q6H  . pantoprazole  40 mg Oral Daily  . potassium chloride  40 mEq Oral BID  . rosuvastatin  10 mg Oral Daily  . sodium chloride flush  3 mL Intravenous Q12H  . traZODone  100 mg Oral QHS   Continuous Infusions: . sodium chloride     PRN Meds: sodium chloride, acetaminophen, albuterol, cyclobenzaprine, ondansetron (ZOFRAN) IV, sodium chloride flush  Allergies:   No Known Allergies  Social History:   Social History   Socioeconomic History  . Marital status: Married    Spouse name: [name]  . Number of children: 2  . Years of education: 67  . Highest education  level: Some college, no degree  Occupational History  . Occupation: Retired    Fish farm manager: [employer]  . Occupation: disabled  Tobacco Use  . Smoking status: Current Every Day Smoker    Packs/day: 0.50    Years: 20.00    Pack years: 10.00    Types: Cigarettes  . Smokeless tobacco: Never Used  Vaping Use  . Vaping Use: Never used  Substance and Sexual Activity  . Alcohol use: Yes    Comment: occasional  . Drug use: No  . Sexual activity: Not Currently  Other Topics Concern  . Not on file  Social History Narrative   Patient has fibromyalgia and has significant pain with that. He has two adult children and is married. He has one grandchild.    Social Determinants of Health   Financial Resource Strain: Low Risk   . Difficulty of Paying Living Expenses: Not hard at all  Food  Insecurity: No Food Insecurity  . Worried About Charity fundraiser in the Last Year: Never true  . Ran Out of Food in the Last Year: Never true  Transportation Needs: No Transportation Needs  . Lack of Transportation (Medical): No  . Lack of Transportation (Non-Medical): No  Physical Activity: Sufficiently Active  . Days of Exercise per Week: 7 days  . Minutes of Exercise per Session: 30 min  Stress: No Stress Concern Present  . Feeling of Stress : Not at all  Social Connections: Moderately Isolated  . Frequency of Communication with Friends and Family: Once a week  . Frequency of Social Gatherings with Friends and Family: More than three times a week  . Attends Religious Services: Never  . Active Member of Clubs or Organizations: No  . Attends Archivist Meetings: Never  . Marital Status: Married  Human resources officer Violence: Not At Risk  . Fear of Current or Ex-Partner: No  . Emotionally Abused: No  . Physically Abused: No  . Sexually Abused: No    Family History:   Family History  Problem Relation Age of Onset  . Heart disease Mother   . Diabetes Mother   . Cancer Mother   . Heart disease Father   . Diabetes Son 15       type 1  . Diabetes Paternal Uncle   . Heart attack Brother   . Hypertension Son      ROS:  Please see the history of present illness.  All other ROS reviewed and negative.     Physical Exam/Data:   Vitals:   03/03/21 1454 03/03/21 1957 03/03/21 2150 03/04/21 0525  BP: 132/85  113/67 114/84  Pulse: 74  83 70  Resp: 20  20 17   Temp: 98.7 F (37.1 C)  98.4 F (36.9 C)   TempSrc: Oral  Oral   SpO2: 93% 94% 96% 93%  Weight:    87.1 kg  Height:        Intake/Output Summary (Last 24 hours) at 03/04/2021 0753 Last data filed at 03/04/2021 0344 Gross per 24 hour  Intake 700 ml  Output 2200 ml  Net -1500 ml   Last 3 Weights 03/04/2021 03/03/2021 03/02/2021  Weight (lbs) 192 lb 0.3 oz 192 lb 10.9 oz 194 lb 3.6 oz  Weight (kg) 87.1 kg  87.4 kg 88.1 kg     Body mass index is 26.78 kg/m.  General:  Well nourished, well developed, in no acute distress HEENT: normal Lymph: no adenopathy Neck: no JVD Endocrine:  No thryomegaly Vascular:  No carotid bruits; FA pulses 2+ bilaterally without bruits  Cardiac:  Irregularly irregular, normal S1 and S2, 3/6 holosystolic murmur Lungs:  clear to auscultation bilaterally, no wheezing, rhonchi or rales  Abd: soft, nontender, no hepatomegaly  Ext: no edema Musculoskeletal:  No deformities, BUE and BLE strength normal and equal Skin: warm and dry  Neuro:  CNs 2-12 intact, no focal abnormalities noted Psych:  Normal affect   EKG:  The EKG was personally reviewed and demonstrates: atrial fibrillation (rate 92 bpm), LVH and repolarization abnormalities.   Telemetry:  Telemetry was personally reviewed and demonstrates atrial fibrillation, rate in the 80s.   Laboratory Data:  High Sensitivity Troponin:   Recent Labs  Lab 03/01/21 1343 03/01/21 1602  TROPONINIHS 11 11     Chemistry Recent Labs  Lab 03/02/21 0512 03/03/21 0503 03/04/21 0358  NA 137 134* 135  K 4.2 4.1 4.4  CL 101 102 101  CO2 25 21* 22  GLUCOSE 153* 189* 204*  BUN 28* 29* 38*  CREATININE 1.07 0.78 0.98  CALCIUM 9.4 9.4 9.5  GFRNONAA >60 >60 >60  ANIONGAP 11 11 12     Recent Labs  Lab 03/01/21 1343  PROT 7.4  ALBUMIN 4.0  AST 26  ALT 20  ALKPHOS 48  BILITOT 0.7   Hematology Recent Labs  Lab 03/01/21 1343  WBC 10.0  RBC 4.58  HGB 13.9  HCT 41.2  MCV 90.0  MCH 30.3  MCHC 33.7  RDW 12.5  PLT 375   BNP Recent Labs  Lab 03/01/21 1343  BNP 301.0*    DDimer  Recent Labs  Lab 03/01/21 1343  DDIMER 1.19*     Radiology/Studies:  DG Chest 2 View  Result Date: 03/03/2021 CLINICAL DATA:  Shortness of breath. EXAM: CHEST - 2 VIEW COMPARISON:  03/01/2021 FINDINGS: Normal heart size. Small pleural effusions and diffuse pulmonary vascular congestion identified compatible with CHF. No  airspace opacities. IMPRESSION: No change in CHF pattern. Electronically Signed   By: Kerby Moors M.D.   On: 03/03/2021 09:38   DG Chest 2 View  Result Date: 03/01/2021 CLINICAL DATA:  Shortness of breath. EXAM: CHEST - 2 VIEW COMPARISON:  December 31, 2015. FINDINGS: The heart size and mediastinal contours are within normal limits. Mild central pulmonary vascular congestion is noted with possible minimal bibasilar pulmonary edema. Small bilateral pleural effusions are noted. No pneumothorax is noted. The visualized skeletal structures are unremarkable. IMPRESSION: Mild central pulmonary vascular congestion is noted with possible minimal bibasilar pulmonary edema. Small bilateral pleural effusions are noted. Electronically Signed   By: Marijo Conception M.D.   On: 03/01/2021 13:57   CT Angio Chest PE W and/or Wo Contrast  Result Date: 03/01/2021 CLINICAL DATA:  PE suspected, high prob Shortness of breath. EXAM: CT ANGIOGRAPHY CHEST WITH CONTRAST TECHNIQUE: Multidetector CT imaging of the chest was performed using the standard protocol during bolus administration of intravenous contrast. Multiplanar CT image reconstructions and MIPs were obtained to evaluate the vascular anatomy. CONTRAST:  130mL OMNIPAQUE IOHEXOL 350 MG/ML SOLN COMPARISON:  Radiograph earlier today. FINDINGS: Cardiovascular: There are no filling defects within the pulmonary arteries to suggest pulmonary embolus. Mild aortic atherosclerosis without aneurysm or dissection. Mild cardiomegaly. Coronary artery and mitral annulus calcifications. No pericardial effusion. Mediastinum/Nodes: Mm anterior paratracheal node, series 4, image 45. Minimal soft tissue density at the right hilum likely small lymph nodes. Small hiatal hernia. No visualized thyroid nodule. Lungs/Pleura: Moderate bilateral pleural effusions with adjacent compressive atelectasis. Scattered smooth septal  thickening consistent with pulmonary edema. There are peribronchovascular  ground-glass opacities in the right perihilar lung and right upper lobe. Associated right greater than left bronchial wall thickening. Upper Abdomen: Water density lesion in the left lobe of the liver is typically cyst. No acute findings in the included upper abdomen. Musculoskeletal: Mild thoracic spondylosis. There are no acute or suspicious osseous abnormalities. Review of the MIP images confirms the above findings. IMPRESSION: 1. No pulmonary embolus. 2. Moderate bilateral pleural effusions with adjacent compressive atelectasis. Smooth septal thickening consistent with pulmonary edema. 3. Peribronchial ground-glass opacities in the right perihilar lung and right upper lobe. Differential considerations include asymmetric alveolar edema, infectious or inflammatory etiology. Consider follow-up after resolution of symptoms to assess for resolution. 4. Bronchial wall thickening, can be seen with bronchitis or congestive. 5. Mild cardiomegaly. Coronary artery calcifications. 6. Small hiatal hernia. Aortic Atherosclerosis (ICD10-I70.0). Electronically Signed   By: Keith Rake M.D.   On: 03/01/2021 17:05   ECHOCARDIOGRAM COMPLETE  Result Date: 03/02/2021    ECHOCARDIOGRAM REPORT   Patient Name:   KOLIN DUBS Date of Exam: 03/02/2021 Medical Rec #:  [id number]  Height:       71.0 in Accession #:    [id number] Weight:       194.2 lb Date of Birth:  [birth year]  BSA:          2.082 m Patient Age:    45 years   BP:           101/73 mmHg Patient Gender: M          HR:           75 bpm. Exam Location:  [facility] Procedure: 2D Echo, Cardiac Doppler and Color Doppler Indications:    Atrial Fibrillation I48.91, Congestive Heart Failure I50.9  History:        Patient has no prior history of Echocardiogram examinations.                 Risk Factors:Hypertension, Diabetes and Dyslipidemia.  Sonographer:    Bernadene Person RDCS Referring Phys: [place]  1. Probable flail posterior MV leaflet; MR is  eccentric and difficult to quantitate; at least moderate and likely severe; suggest TEE to further assess.  2. Left ventricular ejection fraction, by estimation, is 70 to 75%. The left ventricle has hyperdynamic function. The left ventricle has no regional wall motion abnormalities. The left ventricular internal cavity size was mildly dilated. Left ventricular diastolic function could not be evaluated.  3. Right ventricular systolic function is normal. The right ventricular size is normal. Tricuspid regurgitation signal is inadequate for assessing PA pressure.  4. Left atrial size was severely dilated.  5. The mitral valve is normal in structure. No evidence of mitral valve regurgitation. No evidence of mitral stenosis. 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  6. The aortic valve is tricuspid. Aortic valve regurgitation is not visualized. Mild aortic valve sclerosis is present, with no evidence of aortic valve stenosis.  7. Aortic dilatation noted. There is borderline dilatation of the ascending aorta, measuring 38 mm.  8. The inferior vena cava is dilated in size with >50% respiratory variability, suggesting right atrial pressure of 8 mmHg. FINDINGS  Left Ventricle: Left ventricular ejection fraction, by estimation, is 70 to 75%. The left ventricle has hyperdynamic function. The left ventricle has no regional wall motion abnormalities. The left ventricular internal cavity size was mildly dilated. There is no left ventricular  hypertrophy. Left ventricular diastolic function could not be evaluated due to atrial fibrillation. Left ventricular diastolic function could not be evaluated. Right Ventricle: The right ventricular size is normal. Right ventricular systolic function is normal. Tricuspid regurgitation signal is inadequate for assessing PA pressure. The tricuspid regurgitant velocity is 3.02 m/s, and with an assumed right atrial  pressure of 8 mmHg, the estimated right  ventricular systolic pressure is 40.9 mmHg. Left Atrium: Left atrial size was severely dilated. Right Atrium: Right atrial size was normal in size. Pericardium: There is no evidence of pericardial effusion. Mitral Valve: The mitral valve is normal in structure. 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 Mild mitral annular calcification. No evidence of mitral valve regurgitation. No evidence of mitral valve stenosis. Tricuspid Valve: The tricuspid valve is normal in structure. Tricuspid valve regurgitation is trivial. No evidence of tricuspid stenosis. Aortic Valve: The aortic valve is tricuspid. Aortic valve regurgitation is not visualized. Mild aortic valve sclerosis is present, with no evidence of aortic valve stenosis. Pulmonic Valve: The pulmonic valve was not well visualized. Pulmonic valve regurgitation is not visualized. No evidence of pulmonic stenosis. Aorta: Aortic dilatation noted. There is borderline dilatation of the ascending aorta, measuring 38 mm. Venous: The inferior vena cava is dilated in size with greater than 50% respiratory variability, suggesting right atrial pressure of 8 mmHg. IAS/Shunts: No atrial level shunt detected by color flow Doppler. Additional Comments: Probable flail posterior MV leaflet; MR is eccentric and difficult to quantitate; at least moderate and likely severe; suggest TEE to further assess.  LEFT VENTRICLE PLAX 2D LVIDd:         6.07 cm LVIDs:         4.47 cm LV PW:         1.07 cm LV IVS:        1.07 cm LVOT diam:     2.10 cm LV SV:         58 LV SV Index:   28 LVOT Area:     3.46 cm  RIGHT VENTRICLE TAPSE (M-mode): 2.2 cm LEFT ATRIUM              Index       RIGHT ATRIUM           Index LA diam:        6.30 cm  3.03 cm/m  RA Area:     14.90 cm LA Vol (A2C):   111.0 ml 53.30 ml/m RA Volume:   35.70 ml  17.14 ml/m LA Vol (A4C):   114.0 ml 54.74 ml/m LA Biplane Vol: 112.0 ml 53.78 ml/m  AORTIC VALVE LVOT Vmax:   96.67 cm/s LVOT  Vmean:  65.433 cm/s LVOT VTI:    0.167 m  AORTA Ao Root diam: 3.50 cm Ao Asc diam:  3.80 cm MR Peak grad:    51.8 mmHg   TRICUSPID VALVE MR Mean grad:    33.0 mmHg   TR Peak grad:   36.5 mmHg MR Vmax:         360.00 cm/s TR Vmax:        302.00 cm/s MR Vmean:        277.0 cm/s MR PISA:         3.08 cm    SHUNTS MR PISA Eff ROA: 30 mm      Systemic VTI:  0.17 m MR PISA Radius:  0.70 cm     Systemic Diam: 2.10 cm Kirk Ruths MD  Electronically signed by Kirk Ruths MD Signature Date/Time: 03/02/2021/3:18:01 PM    Final      Assessment and Plan:   1. Acute heart failure with preserved EF The patient had presented to the hospital with intermittent chest tightness, worsening shortness of breath and orthopnea (all over the course of a week). In the hospital he was documented to be in AFIB. Exam suggested pulmonary congestion (confirmed on a CXR). He was placed on Eliquis for the AFIB. Diuresed with IV lasix with improvement in his dyspnea.  Echo from 03/02/21 showed eccentric MR (likely severe) with a flail posterior leaflet of the MV. EF normal. PA pressures could not be estimated.  -Based on last dosing of Eliquis, patient will need a RHC/LHC/Coros -Will also schedule a TEE to better delineate the MV anatomy -Strict I and Os -Daily weights -Continue diuresis with IV lasix -Maintain serum K >4.0 and Mg >2.0   2. Paroxysmal atrial fibrillation Patient presents with newly diagnosed AFIB. CHA2DS2- VASc score = 4 (age, HTN, DM and CHF)    -Hold Eliquis in anticipation of cardiac catheterization in the near future -Place on IV heparin in the meantime  The heart rate in the 80s. Will hold off on AV nodal blockers for now.   For questions or updates, please contact [place] Please consult [email or web address] for contact info under    Signed, Meade Maw, MD  03/04/2021 7:53 AM

## 2021-03-04 NOTE — Care Management Important Message (Signed)
Important Message  Patient Details  Name: Michael Valenzuela MRN: [id number] Date of Birth: Sep 28, 1956   Medicare Important Message Given:  Yes     Tommy Medal 03/04/2021, 12:22 PM

## 2021-03-04 NOTE — Progress Notes (Signed)
Patient Demographics:    Michael Valenzuela, is a 65 y.o. male, DOB - 1956/02/15, [id number]  Admit date - 03/01/2021   Admitting Physician Truett Mainland, DO  Outpatient Primary MD for the patient is Chevis Pretty, [place]  LOS - 3   Chief Complaint  Patient presents with  . Shortness of Breath        Subjective:    [name] today has no fevers, no emesis,  No chest pain,   No significant dyspnea at rest,  - hypoxia resolved -Dyspnea on exertion improving  -Voiding well -  Assessment  & Plan :    Principal Problem:   Acute on chronic heart failure with preserved ejection fraction (HFpEF)/in the setting of severe mitral valve prolapse Active Problems:   Mitral valve posterior leaflet prolapse/-severe holosystolic prolapse of posterior leaflet of the mitral valve   Essential hypertension, benign   Type 2 diabetes mellitus with other specified complication (HCC)   Atrial fibrillation (HCC)   Fibromyalgia   Hyperlipidemia with target LDL less than 100   Pleural effusion   Dyspnea and respiratory abnormalities   Brief Summary:- 65 y.o. male with a history of diabetes, fibromyalgia, hyperlipidemia and hypertension admitted on 03/01/2021 with acute hypoxic respiratory failure due to HFpEF CHF and bronchitis   A/p 1)Acute hypoxic respiratory failure----multifactorial etiology No further hypoxia  -No pulmonary embolism on CTA chest -Troponin is not elevated -CHF findings with pulmonary edema, moderate bilateral effusions with adjacent compressive atelectasis noted, patient also has groundglass opacities mostly in the right lung suggestive of possible infection versus alveolar edema as well as bronchial wall thickening suggestive of bronchitis -BNP 301, no baseline available -Diuresing well with IV Lasix, monitor electrolytes and daily weight -Treated with very short course of  steroids, -Continue bronchodilators and azithromycin for presumed bronchitis with possible bronchial spasms -Patient without fevers or leukocytosis -Echo noted  2)PAFIb-Echo noted, TSH WNL  CHA2DS2- VASc score   is = 4 (age, HTN, DM and CHF)   Which is  equal to =  4.8 % annual risk of stroke  -Potassium and magnesium WNL -Continue Eliquis for stroke prophylaxis (Eliquis on hold since a.m. of 03/03/2021 to allow for TEE and cardiac catheterization procedures--currently planned for 03/05/2021) -Continue metoprolol 25 mg twice daily for rate control  3) acute on chronic  HFpEF--- in the setting of severe mitral valve prolapse -IV Lasix for diuresis, daily weight and fluid input and output monitoring -Cardiology consult for possible TEE and cardiac cath to further evaluate the mitral valve appreciated  4)Severe Mitral Valve Prolapse--- echo from 03/02/2021 showed severe holosystolic prolapse of posterior leaflet of the mitral valve -Cardiology consult Appreciated, TEE and cardiac cath pending on 03/05/21  5)Polypharmacy----patient is on amlodipine so we stopped felodipine -Patient is on Lasix so we will stop HCTZ -Monitor BP and electrolytes closely  6)HLD--continue Crestor and fenofibrate, recent lipid profile with LDL around 70 and HDL around 40  7)DM2-continue Levemir 15 units nightly (hold tonight's dose as patient will be n.p.o. for possible TEE and cath in am ) -Hold metformin and Januvia Use Novolog/Humalog Sliding scale insulin with Accu-Cheks/Fingersticks as ordered   Disposition/Need for in-Hospital Stay- patient unable to be discharged at this time due to --acute CHF requiring  IV diuresis -Transferred to [facility] for TEE and cardiac cath on 03/05/2021  Status is: Inpatient  Remains inpatient appropriate because:Please see above   Disposition: The patient is from: Home              Anticipated d/c is to: Home              Anticipated d/c date is: 2 days               Patient currently is not medically stable to d/c. Barriers: Not Clinically Stable-  Code Status :  -  Code Status: Full Code   Family Communication:    NA (patient is alert, awake and coherent)   Consults  :  na  DVT Prophylaxis  :   - SCDs  apixaban (ELIQUIS) tablet 5 mg (on hold since 03/03/21 to allow for TEE)   Lab Results  Component Value Date   PLT 375 03/01/2021    Inpatient Medications  Scheduled Meds: . albuterol  2.5 mg Nebulization BID  . [START ON 03/05/2021] amLODipine  5 mg Oral Daily  . [START ON 03/05/2021] apixaban  5 mg Oral BID  . fenofibrate  160 mg Oral Daily  . furosemide  40 mg Intravenous Daily  . guaiFENesin  600 mg Oral BID  . insulin aspart  0-5 Units Subcutaneous QHS  . insulin aspart  0-6 Units Subcutaneous TID WC  . [START ON 03/05/2021] insulin detemir  15 Units Subcutaneous QHS  . metoprolol tartrate  25 mg Oral BID  . oxyCODONE  15 mg Oral Q6H  . pantoprazole  40 mg Oral Daily  . potassium chloride  40 mEq Oral BID  . rosuvastatin  10 mg Oral Daily  . sodium chloride flush  3 mL Intravenous Q12H  . traZODone  100 mg Oral QHS   Continuous Infusions: . sodium chloride     PRN Meds:.sodium chloride, acetaminophen, albuterol, cyclobenzaprine, ondansetron (ZOFRAN) IV, sodium chloride flush    Anti-infectives (From admission, onward)   None        Objective:   Vitals:   03/04/21 0758 03/04/21 0940 03/04/21 1344 03/04/21 1506  BP:  109/64 115/83 (!) 105/92  Pulse:  88 67 80  Resp:   20 18  Temp:   98.5 F (36.9 C) 98.3 F (36.8 C)  TempSrc:   Oral Oral  SpO2: 97%  97% 97%  Weight:    86.7 kg  Height:    6' (1.829 m)    Wt Readings from Last 3 Encounters:  03/04/21 86.7 kg  12/28/20 89.1 kg  09/27/20 89.8 kg    Intake/Output Summary (Last 24 hours) at 03/04/2021 1647 Last data filed at 03/04/2021 1342 Gross per 24 hour  Intake 1200 ml  Output 1600 ml  Net -400 ml    Physical Exam  Gen:- Awake Alert,  In no apparent  distress  HEENT:- [place].AT, No sclera icterus Neck-Supple Neck,No JVD,.  Lungs-improving air movement, no wheezing  CV- S1, S2 normal, irregular , 3/6 SM Abd-  +ve B.Sounds, Abd Soft, No tenderness,    Extremity/Skin:-No significant pitting edema, pedal pulses present Psych-affect is appropriate, oriented x3 Neuro-no new focal deficits, no tremors   Data Review:   Micro Results Recent Results (from the past 240 hour(s))  Resp Panel by RT-PCR (Flu A&B, Covid) Nasopharyngeal Swab     Status: None   Collection Time: 03/01/21  3:05 PM   Specimen: Nasopharyngeal Swab; Nasopharyngeal(NP) swabs in vial transport medium  Result Value Ref Range Status   SARS Coronavirus 2 by RT PCR NEGATIVE NEGATIVE Final    Comment: (NOTE) SARS-CoV-2 target nucleic acids are NOT DETECTED.  The SARS-CoV-2 RNA is generally detectable in upper respiratory specimens during the acute phase of infection. The lowest concentration of SARS-CoV-2 viral copies this assay can detect is 138 copies/mL. A negative result does not preclude SARS-Cov-2 infection and should not be used as the sole basis for treatment or other patient management decisions. A negative result may occur with  improper specimen collection/handling, submission of specimen other than nasopharyngeal swab, presence of viral mutation(s) within the areas targeted by this assay, and inadequate number of viral copies(<138 copies/mL). A negative result must be combined with clinical observations, patient history, and epidemiological information. The expected result is Negative.  Fact Sheet for Patients:  EntrepreneurPulse.com.au  Fact Sheet for Healthcare Providers:  IncredibleEmployment.be  This test is no t yet approved or cleared by the [place] FDA and  has been authorized for detection and/or diagnosis of SARS-CoV-2 by FDA under an Emergency Use Authorization (EUA). This EUA will remain  in effect  (meaning this test can be used) for the duration of the COVID-19 declaration under Section 564(b)(1) of the Act, 21 U.S.C.section 360bbb-3(b)(1), unless the authorization is terminated  or revoked sooner.       Influenza A by PCR NEGATIVE NEGATIVE Final   Influenza B by PCR NEGATIVE NEGATIVE Final    Comment: (NOTE) The Xpert Xpress SARS-CoV-2/FLU/RSV plus assay is intended as an aid in the diagnosis of influenza from Nasopharyngeal swab specimens and should not be used as a sole basis for treatment. Nasal washings and aspirates are unacceptable for Xpert Xpress SARS-CoV-2/FLU/RSV testing.  Fact Sheet for Patients: EntrepreneurPulse.com.au  Fact Sheet for Healthcare Providers: IncredibleEmployment.be  This test is not yet approved or cleared by the [place] FDA and has been authorized for detection and/or diagnosis of SARS-CoV-2 by FDA under an Emergency Use Authorization (EUA). This EUA will remain in effect (meaning this test can be used) for the duration of the COVID-19 declaration under Section 564(b)(1) of the Act, 21 U.S.C. section 360bbb-3(b)(1), unless the authorization is terminated or revoked.  Performed at [facility], [address] [place], [place] [address]     Radiology Reports DG Chest 2 View  Result Date: 03/03/2021 CLINICAL DATA:  Shortness of breath. EXAM: CHEST - 2 VIEW COMPARISON:  03/01/2021 FINDINGS: Normal heart size. Small pleural effusions and diffuse pulmonary vascular congestion identified compatible with CHF. No airspace opacities. IMPRESSION: No change in CHF pattern. Electronically Signed   By: Kerby Moors M.D.   On: 03/03/2021 09:38   DG Chest 2 View  Result Date: 03/01/2021 CLINICAL DATA:  Shortness of breath. EXAM: CHEST - 2 VIEW COMPARISON:  December 31, 2015. FINDINGS: The heart size and mediastinal contours are within normal limits. Mild central pulmonary vascular congestion is noted with  possible minimal bibasilar pulmonary edema. Small bilateral pleural effusions are noted. No pneumothorax is noted. The visualized skeletal structures are unremarkable. IMPRESSION: Mild central pulmonary vascular congestion is noted with possible minimal bibasilar pulmonary edema. Small bilateral pleural effusions are noted. Electronically Signed   By: Marijo Conception M.D.   On: 03/01/2021 13:57   CT Angio Chest PE W and/or Wo Contrast  Result Date: 03/01/2021 CLINICAL DATA:  PE suspected, high prob Shortness of breath. EXAM: CT ANGIOGRAPHY CHEST WITH CONTRAST TECHNIQUE: Multidetector CT imaging of the chest was performed using the standard protocol during  bolus administration of intravenous contrast. Multiplanar CT image reconstructions and MIPs were obtained to evaluate the vascular anatomy. CONTRAST:  196mL OMNIPAQUE IOHEXOL 350 MG/ML SOLN COMPARISON:  Radiograph earlier today. FINDINGS: Cardiovascular: There are no filling defects within the pulmonary arteries to suggest pulmonary embolus. Mild aortic atherosclerosis without aneurysm or dissection. Mild cardiomegaly. Coronary artery and mitral annulus calcifications. No pericardial effusion. Mediastinum/Nodes: Mm anterior paratracheal node, series 4, image 45. Minimal soft tissue density at the right hilum likely small lymph nodes. Small hiatal hernia. No visualized thyroid nodule. Lungs/Pleura: Moderate bilateral pleural effusions with adjacent compressive atelectasis. Scattered smooth septal thickening consistent with pulmonary edema. There are peribronchovascular ground-glass opacities in the right perihilar lung and right upper lobe. Associated right greater than left bronchial wall thickening. Upper Abdomen: Water density lesion in the left lobe of the liver is typically cyst. No acute findings in the included upper abdomen. Musculoskeletal: Mild thoracic spondylosis. There are no acute or suspicious osseous abnormalities. Review of the MIP images  confirms the above findings. IMPRESSION: 1. No pulmonary embolus. 2. Moderate bilateral pleural effusions with adjacent compressive atelectasis. Smooth septal thickening consistent with pulmonary edema. 3. Peribronchial ground-glass opacities in the right perihilar lung and right upper lobe. Differential considerations include asymmetric alveolar edema, infectious or inflammatory etiology. Consider follow-up after resolution of symptoms to assess for resolution. 4. Bronchial wall thickening, can be seen with bronchitis or congestive. 5. Mild cardiomegaly. Coronary artery calcifications. 6. Small hiatal hernia. Aortic Atherosclerosis (ICD10-I70.0). Electronically Signed   By: Keith Rake M.D.   On: 03/01/2021 17:05   ECHOCARDIOGRAM COMPLETE  Result Date: 03/02/2021    ECHOCARDIOGRAM REPORT   Patient Name:   SELVIN GRANEY Date of Exam: 03/02/2021 Medical Rec #:  [id number]  Height:       71.0 in Accession #:    [id number] Weight:       194.2 lb Date of Birth:  1956-05-01  BSA:          2.082 m Patient Age:    35 years   BP:           101/73 mmHg Patient Gender: M          HR:           75 bpm. Exam Location:  [facility] Procedure: 2D Echo, Cardiac Doppler and Color Doppler Indications:    Atrial Fibrillation I48.91, Congestive Heart Failure I50.9  History:        Patient has no prior history of Echocardiogram examinations.                 Risk Factors:Hypertension, Diabetes and Dyslipidemia.  Sonographer:    Bernadene Person RDCS Referring Phys: [place]  1. Probable flail posterior MV leaflet; MR is eccentric and difficult to quantitate; at least moderate and likely severe; suggest TEE to further assess.  2. Left ventricular ejection fraction, by estimation, is 70 to 75%. The left ventricle has hyperdynamic function. The left ventricle has no regional wall motion abnormalities. The left ventricular internal cavity size was mildly dilated. Left ventricular diastolic function could not be  evaluated.  3. Right ventricular systolic function is normal. The right ventricular size is normal. Tricuspid regurgitation signal is inadequate for assessing PA pressure.  4. Left atrial size was severely dilated.  5. The mitral valve is normal in structure. No evidence of mitral valve regurgitation. No evidence of mitral stenosis. 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  6.  The aortic valve is tricuspid. Aortic valve regurgitation is not visualized. Mild aortic valve sclerosis is present, with no evidence of aortic valve stenosis.  7. Aortic dilatation noted. There is borderline dilatation of the ascending aorta, measuring 38 mm.  8. The inferior vena cava is dilated in size with >50% respiratory variability, suggesting right atrial pressure of 8 mmHg. FINDINGS  Left Ventricle: Left ventricular ejection fraction, by estimation, is 70 to 75%. The left ventricle has hyperdynamic function. The left ventricle has no regional wall motion abnormalities. The left ventricular internal cavity size was mildly dilated. There is no left ventricular hypertrophy. Left ventricular diastolic function could not be evaluated due to atrial fibrillation. Left ventricular diastolic function could not be evaluated. Right Ventricle: The right ventricular size is normal. Right ventricular systolic function is normal. Tricuspid regurgitation signal is inadequate for assessing PA pressure. The tricuspid regurgitant velocity is 3.02 m/s, and with an assumed right atrial  pressure of 8 mmHg, the estimated right ventricular systolic pressure is 31.5 mmHg. Left Atrium: Left atrial size was severely dilated. Right Atrium: Right atrial size was normal in size. Pericardium: There is no evidence of pericardial effusion. Mitral Valve: The mitral valve is normal in structure. 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 Mild mitral annular calcification. No evidence of mitral  valve regurgitation. No evidence of mitral valve stenosis. Tricuspid Valve: The tricuspid valve is normal in structure. Tricuspid valve regurgitation is trivial. No evidence of tricuspid stenosis. Aortic Valve: The aortic valve is tricuspid. Aortic valve regurgitation is not visualized. Mild aortic valve sclerosis is present, with no evidence of aortic valve stenosis. Pulmonic Valve: The pulmonic valve was not well visualized. Pulmonic valve regurgitation is not visualized. No evidence of pulmonic stenosis. Aorta: Aortic dilatation noted. There is borderline dilatation of the ascending aorta, measuring 38 mm. Venous: The inferior vena cava is dilated in size with greater than 50% respiratory variability, suggesting right atrial pressure of 8 mmHg. IAS/Shunts: No atrial level shunt detected by color flow Doppler. Additional Comments: Probable flail posterior MV leaflet; MR is eccentric and difficult to quantitate; at least moderate and likely severe; suggest TEE to further assess.  LEFT VENTRICLE PLAX 2D LVIDd:         6.07 cm LVIDs:         4.47 cm LV PW:         1.07 cm LV IVS:        1.07 cm LVOT diam:     2.10 cm LV SV:         58 LV SV Index:   28 LVOT Area:     3.46 cm  RIGHT VENTRICLE TAPSE (M-mode): 2.2 cm LEFT ATRIUM              Index       RIGHT ATRIUM           Index LA diam:        6.30 cm  3.03 cm/m  RA Area:     14.90 cm LA Vol (A2C):   111.0 ml 53.30 ml/m RA Volume:   35.70 ml  17.14 ml/m LA Vol (A4C):   114.0 ml 54.74 ml/m LA Biplane Vol: 112.0 ml 53.78 ml/m  AORTIC VALVE LVOT Vmax:   96.67 cm/s LVOT Vmean:  65.433 cm/s LVOT VTI:    0.167 m  AORTA Ao Root diam: 3.50 cm Ao Asc diam:  3.80 cm MR Peak grad:    51.8 mmHg  TRICUSPID VALVE MR Mean grad:    33.0 mmHg   TR Peak grad:   36.5 mmHg MR Vmax:         360.00 cm/s TR Vmax:        302.00 cm/s MR Vmean:        277.0 cm/s MR PISA:         3.08 cm    SHUNTS MR PISA Eff ROA: 30 mm      Systemic VTI:  0.17 m MR PISA Radius:  0.70 cm      Systemic Diam: 2.10 cm Kirk Ruths MD Electronically signed by Kirk Ruths MD Signature Date/Time: 03/02/2021/3:18:01 PM    Final      CBC Recent Labs  Lab 03/01/21 1343  WBC 10.0  HGB 13.9  HCT 41.2  PLT 375  MCV 90.0  MCH 30.3  MCHC 33.7  RDW 12.5    Chemistries  Recent Labs  Lab 03/01/21 1343 03/02/21 0512 03/03/21 0503 03/04/21 0358  NA 136 137 134* 135  K 3.9 4.2 4.1 4.4  CL 104 101 102 101  CO2 22 25 21* 22  GLUCOSE 139* 153* 189* 204*  BUN 24* 28* 29* 38*  CREATININE 0.95 1.07 0.78 0.98  CALCIUM 9.3 9.4 9.4 9.5  MG  --  2.1  --   --   AST 26  --   --   --   ALT 20  --   --   --   ALKPHOS 48  --   --   --   BILITOT 0.7  --   --   --    ------------------------------------------------------------------------------------------------------------------ No results for input(s): CHOL, HDL, LDLCALC, TRIG, CHOLHDL, LDLDIRECT in the last 72 hours.  Lab Results  Component Value Date   HGBA1C 6.4 12/28/2020   ------------------------------------------------------------------------------------------------------------------ Recent Labs    03/02/21 0513  TSH 4.498   ------------------------------------------------------------------------------------------------------------------ No results for input(s): VITAMINB12, FOLATE, FERRITIN, TIBC, IRON, RETICCTPCT in the last 72 hours.  Coagulation profile No results for input(s): INR, PROTIME in the last 168 hours.  No results for input(s): DDIMER in the last 72 hours.  Cardiac Enzymes No results for input(s): CKMB, TROPONINI, MYOGLOBIN in the last 168 hours.  Invalid input(s): CK ------------------------------------------------------------------------------------------------------------------    Component Value Date/Time   BNP 301.0 (H) 03/01/2021 1343     Roxan Hockey M.D on 03/04/2021 at 4:47 PM  Go to [email or web address] - for contact info  [facility] - Office  [phone]

## 2021-03-04 NOTE — Progress Notes (Signed)
   Reviewed consult note from earlier today.  Agree with proceeding with TEE to understand his mitral valve anatomy, perhaps severe.  We can also evaluate his left atrial appendage at that time however he will be off of anticoagulation and therefore cardioversion cannot take place.  Then we will follow with right and left heart catheterization.  Continuing to hold Eliquis.  Last dose was morning of 03/03/2021  Currently in atrial fibrillation.  He was not on anticoagulation prior to this admission.  His shortness of breath came on quite suddenly.  Question flail leaflet/ruptured cord.  We discussed TEE and heart catheterization.  He is willing to proceed.  Risks and benefits including esophageal damage, stroke heart attack death renal impairment bleeding discussed.  He is willing to proceed.  Currently with 3/6 holosystolic murmur at apex.  Irregularly irregular.  Lungs with no crackles  Candee Furbish, MD

## 2021-03-04 NOTE — Consult Note (Signed)
Cardiology Consultation:   Patient ID: Michael Valenzuela MRN: [id number]; DOB: 02/07/1956  Admit date: 03/01/2021 Date of Consult: 03/04/2021  PCP:  Martin, Mary-Margaret, FNP   [facility]  Cardiologist:  No primary care provider on file.  Advanced Practice Provider:  No care team member to display Electrophysiologist:  None  [id number]}    Patient Profile:   Michael Valenzuela is a 64 y.o. male with a hx of DM, HTN, HLD, tobacco use, chronic back pain, & fibromyalgia who is being seen today for the evaluation of worsening shortness of breath at the request of Emokpae, Courage, MD.  History of Present Illness:   Michael Valenzuela with no prior cardiac history presents to the hospital with complaints of shortness of breath, worsening over the past week or so. He has been noticing difficulty in lying flat and also some chest tightness. The symptoms started rather suddenly while he was out working in the yard 1 week ago. On Sunday 02/24/2021 he felt soreness in the chest and some tightness. He thought he had aggravated his fibromyalgia. By Wednesday 02/27/21 he was having difficulty sleeping flat due to significant shortness of breath and on Friday 03/01/21 he was experiencing resting dyspnea prompting him to go to his family physician. He was documented to be in new onset AFIB and he was then sent to urgent care and eventually got admitted to the hospital. Initial O2 sat 89%. Treated with IV lasix, supplemental oxygen. Started on IV heparin initially and then transitioned to Eliquis for AFIB.  The ECG on 03/01/2021 (at 14:59), that I reviewed personally, revealed atrial fibrillation (rate 92 bpm), LVH and repolarization abnormalities. CXR showed small pleural effusions and diffuse pulmonary vascular congestion identified compatible with CHF. Hs-Trops were 11 and 11. BNP was 301. COVID screen was negative.  Since his hospitalization, he underwent a transthoracic echo that showed likely severe  eccentric MR, flail posterior MV leaflet, EF 70-75%, mildly dilated LV, severely dil LA. He has responded well to diuresis with IV lasix. He is able to lie flat now and his shortness of breath has improved significantly.   Pertinent testing Echocardiogram - 03/02/2021 1. Probable flail posterior MV leaflet; MR is eccentric and difficult to quantitate; at least moderate and likely severe; suggest TEE to further assess. 2. Left ventricular ejection fraction, by estimation, is 70 to 75%. The left ventricle has hyperdynamic function. The left ventricle has no regional wall motion abnormalities. The left ventricular internal cavity size was mildly dilated. Left ventricular diastolic function could not be evaluated. 3. Right ventricular systolic function is normal. The right ventricular size is normal. Tricuspid regurgitation signal is inadequate for assessing PA pressure. 4. Left atrial size was severely dilated. 5. The mitral valve is normal in structure. No evidence of mitral valve regurgitation. No evidence of mitral stenosis. 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 6. The aortic valve is tricuspid. Aortic valve regurgitation is not visualized. Mild aortic valve sclerosis is present, with no evidence of aortic valve stenosis. 7. Aortic dilatation noted. There is borderline dilatation of the ascending aorta, measuring 38 mm. 8. The inferior vena cava is dilated in size with >50% respiratory variability, suggesting right atrial pressure of 8 mmHg.     Past Medical History:  Diagnosis Date  . Chronic low back pain   . Diabetes mellitus without complication (HCC)   . Fibromyalgia   . Hyperlipidemia   . Hypertension   . Vitamin D deficiency       Past Surgical History:  Procedure Laterality Date  . HERNIA REPAIR       Home Medications:  Prior to Admission medications   Medication Sig Start Date End Date Taking? Authorizing Provider   amLODipine (NORVASC) 10 MG tablet TAKE 1 TABLET(10 MG) BY MOUTH DAILY Patient taking differently: Take 10 mg by mouth daily. TAKE 1 TABLET(10 MG) BY MOUTH DAILY 12/28/20  Yes Hassell Done, Mary-Margaret, FNP  aspirin EC 81 MG tablet Take 1 tablet (81 mg total) by mouth daily. 09/18/14  Yes Cherre Robins, PharmD  felodipine (PLENDIL) 10 MG 24 hr tablet TAKE 1 TABLET(10 MG) BY MOUTH DAILY Patient taking differently: Take 10 mg by mouth daily. TAKE 1 TABLET(10 MG) BY MOUTH DAILY 12/28/20  Yes Hassell Done, Mary-Margaret, FNP  fenofibrate 160 MG tablet TAKE 1 TABLET(160 MG) BY MOUTH DAILY Patient taking differently: Take 160 mg by mouth daily. TAKE 1 TABLET(160 MG) BY MOUTH DAILY 12/28/20  Yes Hassell Done, Mary-Margaret, FNP  hydrochlorothiazide (HYDRODIURIL) 25 MG tablet TAKE 1 TABLET(25 MG) BY MOUTH DAILY Patient taking differently: Take 25 mg by mouth daily. TAKE 1 TABLET(25 MG) BY MOUTH DAILY 12/28/20  Yes Hassell Done, Mary-Margaret, FNP  losartan (COZAAR) 100 MG tablet TAKE 1 TABLET(100 MG) BY MOUTH DAILY 12/28/20  Yes Hassell Done, Mary-Margaret, FNP  metFORMIN (GLUCOPHAGE) 1000 MG tablet TAKE 1 TABLET(1000 MG) BY MOUTH TWICE DAILY WITH A MEAL Patient taking differently: Take 1,000 mg by mouth 2 (two) times daily with a meal. TAKE 1 TABLET(1000 MG) BY MOUTH TWICE DAILY WITH A MEAL 12/28/20  Yes Hassell Done, Mary-Margaret, FNP  oxyCODONE (ROXICODONE) 15 MG immediate release tablet Take 15 mg by mouth every 6 (six) hours as needed for pain. 01/12/19  Yes [provider]  rosuvastatin (CRESTOR) 10 MG tablet Take 1 tablet (10 mg total) by mouth daily. 12/28/20  Yes Martin, Mary-Margaret, FNP  sitaGLIPtin (JANUVIA) 100 MG tablet TAKE 1 TABLET(100 MG) BY MOUTH DAILY Patient taking differently: Take 100 mg by mouth daily. TAKE 1 TABLET(100 MG) BY MOUTH DAILY 12/28/20  Yes Hassell Done, Mary-Margaret, FNP  cyclobenzaprine (FLEXERIL) 10 MG tablet TAKE 1 TABLET BY MOUTH THREE TIMES DAILY AS NEEDED FOR MUSCLE SPASMS Patient not taking: No sig  reported 01/16/21   Chevis Pretty, FNP    Inpatient Medications: Scheduled Meds: . albuterol  2.5 mg Nebulization BID  . amLODipine  10 mg Oral Daily  . apixaban  5 mg Oral BID  . fenofibrate  160 mg Oral Daily  . furosemide  20 mg Intravenous QPM  . furosemide  40 mg Intravenous Daily  . guaiFENesin  600 mg Oral BID  . insulin aspart  0-5 Units Subcutaneous QHS  . insulin aspart  0-6 Units Subcutaneous TID WC  . insulin detemir  15 Units Subcutaneous QHS  . metoprolol tartrate  25 mg Oral BID  . oxyCODONE  15 mg Oral Q6H  . pantoprazole  40 mg Oral Daily  . potassium chloride  40 mEq Oral BID  . rosuvastatin  10 mg Oral Daily  . sodium chloride flush  3 mL Intravenous Q12H  . traZODone  100 mg Oral QHS   Continuous Infusions: . sodium chloride     PRN Meds: sodium chloride, acetaminophen, albuterol, cyclobenzaprine, ondansetron (ZOFRAN) IV, sodium chloride flush  Allergies:   No Known Allergies  Social History:   Social History   Socioeconomic History  . Marital status: Married    Spouse name: [name]  . Number of children: 2  . Years of education: 67  . Highest education  level: Some college, no degree  Occupational History  . Occupation: Retired    Fish farm manager: [employer]  . Occupation: disabled  Tobacco Use  . Smoking status: Current Every Day Smoker    Packs/day: 0.50    Years: 20.00    Pack years: 10.00    Types: Cigarettes  . Smokeless tobacco: Never Used  Vaping Use  . Vaping Use: Never used  Substance and Sexual Activity  . Alcohol use: Yes    Comment: occasional  . Drug use: No  . Sexual activity: Not Currently  Other Topics Concern  . Not on file  Social History Narrative   Patient has fibromyalgia and has significant pain with that. He has two adult children and is married. He has one grandchild.    Social Determinants of Health   Financial Resource Strain: Low Risk   . Difficulty of Paying Living Expenses: Not hard at all  Food  Insecurity: No Food Insecurity  . Worried About Charity fundraiser in the Last Year: Never true  . Ran Out of Food in the Last Year: Never true  Transportation Needs: No Transportation Needs  . Lack of Transportation (Medical): No  . Lack of Transportation (Non-Medical): No  Physical Activity: Sufficiently Active  . Days of Exercise per Week: 7 days  . Minutes of Exercise per Session: 30 min  Stress: No Stress Concern Present  . Feeling of Stress : Not at all  Social Connections: Moderately Isolated  . Frequency of Communication with Friends and Family: Once a week  . Frequency of Social Gatherings with Friends and Family: More than three times a week  . Attends Religious Services: Never  . Active Member of Clubs or Organizations: No  . Attends Archivist Meetings: Never  . Marital Status: Married  Human resources officer Violence: Not At Risk  . Fear of Current or Ex-Partner: No  . Emotionally Abused: No  . Physically Abused: No  . Sexually Abused: No    Family History:   Family History  Problem Relation Age of Onset  . Heart disease Mother   . Diabetes Mother   . Cancer Mother   . Heart disease Father   . Diabetes Son 15       type 1  . Diabetes Paternal Uncle   . Heart attack Brother   . Hypertension Son      ROS:  Please see the history of present illness.  All other ROS reviewed and negative.     Physical Exam/Data:   Vitals:   03/03/21 1454 03/03/21 1957 03/03/21 2150 03/04/21 0525  BP: 132/85  113/67 114/84  Pulse: 74  83 70  Resp: 20  20 17   Temp: 98.7 F (37.1 C)  98.4 F (36.9 C)   TempSrc: Oral  Oral   SpO2: 93% 94% 96% 93%  Weight:    87.1 kg  Height:        Intake/Output Summary (Last 24 hours) at 03/04/2021 0753 Last data filed at 03/04/2021 0344 Gross per 24 hour  Intake 700 ml  Output 2200 ml  Net -1500 ml   Last 3 Weights 03/04/2021 03/03/2021 03/02/2021  Weight (lbs) 192 lb 0.3 oz 192 lb 10.9 oz 194 lb 3.6 oz  Weight (kg) 87.1 kg  87.4 kg 88.1 kg     Body mass index is 26.78 kg/m.  General:  Well nourished, well developed, in no acute distress HEENT: normal Lymph: no adenopathy Neck: no JVD Endocrine:  No thryomegaly Vascular:  No carotid bruits; FA pulses 2+ bilaterally without bruits  Cardiac:  Irregularly irregular, normal S1 and S2, 3/6 holosystolic murmur Lungs:  clear to auscultation bilaterally, no wheezing, rhonchi or rales  Abd: soft, nontender, no hepatomegaly  Ext: no edema Musculoskeletal:  No deformities, BUE and BLE strength normal and equal Skin: warm and dry  Neuro:  CNs 2-12 intact, no focal abnormalities noted Psych:  Normal affect   EKG:  The EKG was personally reviewed and demonstrates: atrial fibrillation (rate 92 bpm), LVH and repolarization abnormalities.   Telemetry:  Telemetry was personally reviewed and demonstrates atrial fibrillation, rate in the 80s.   Laboratory Data:  High Sensitivity Troponin:   Recent Labs  Lab 03/01/21 1343 03/01/21 1602  TROPONINIHS 11 11     Chemistry Recent Labs  Lab 03/02/21 0512 03/03/21 0503 03/04/21 0358  NA 137 134* 135  K 4.2 4.1 4.4  CL 101 102 101  CO2 25 21* 22  GLUCOSE 153* 189* 204*  BUN 28* 29* 38*  CREATININE 1.07 0.78 0.98  CALCIUM 9.4 9.4 9.5  GFRNONAA >60 >60 >60  ANIONGAP 11 11 12     Recent Labs  Lab 03/01/21 1343  PROT 7.4  ALBUMIN 4.0  AST 26  ALT 20  ALKPHOS 48  BILITOT 0.7   Hematology Recent Labs  Lab 03/01/21 1343  WBC 10.0  RBC 4.58  HGB 13.9  HCT 41.2  MCV 90.0  MCH 30.3  MCHC 33.7  RDW 12.5  PLT 375   BNP Recent Labs  Lab 03/01/21 1343  BNP 301.0*    DDimer  Recent Labs  Lab 03/01/21 1343  DDIMER 1.19*     Radiology/Studies:  DG Chest 2 View  Result Date: 03/03/2021 CLINICAL DATA:  Shortness of breath. EXAM: CHEST - 2 VIEW COMPARISON:  03/01/2021 FINDINGS: Normal heart size. Small pleural effusions and diffuse pulmonary vascular congestion identified compatible with CHF. No  airspace opacities. IMPRESSION: No change in CHF pattern. Electronically Signed   By: Kerby Moors M.D.   On: 03/03/2021 09:38   DG Chest 2 View  Result Date: 03/01/2021 CLINICAL DATA:  Shortness of breath. EXAM: CHEST - 2 VIEW COMPARISON:  December 31, 2015. FINDINGS: The heart size and mediastinal contours are within normal limits. Mild central pulmonary vascular congestion is noted with possible minimal bibasilar pulmonary edema. Small bilateral pleural effusions are noted. No pneumothorax is noted. The visualized skeletal structures are unremarkable. IMPRESSION: Mild central pulmonary vascular congestion is noted with possible minimal bibasilar pulmonary edema. Small bilateral pleural effusions are noted. Electronically Signed   By: Marijo Conception M.D.   On: 03/01/2021 13:57   CT Angio Chest PE W and/or Wo Contrast  Result Date: 03/01/2021 CLINICAL DATA:  PE suspected, high prob Shortness of breath. EXAM: CT ANGIOGRAPHY CHEST WITH CONTRAST TECHNIQUE: Multidetector CT imaging of the chest was performed using the standard protocol during bolus administration of intravenous contrast. Multiplanar CT image reconstructions and MIPs were obtained to evaluate the vascular anatomy. CONTRAST:  133mL OMNIPAQUE IOHEXOL 350 MG/ML SOLN COMPARISON:  Radiograph earlier today. FINDINGS: Cardiovascular: There are no filling defects within the pulmonary arteries to suggest pulmonary embolus. Mild aortic atherosclerosis without aneurysm or dissection. Mild cardiomegaly. Coronary artery and mitral annulus calcifications. No pericardial effusion. Mediastinum/Nodes: Mm anterior paratracheal node, series 4, image 45. Minimal soft tissue density at the right hilum likely small lymph nodes. Small hiatal hernia. No visualized thyroid nodule. Lungs/Pleura: Moderate bilateral pleural effusions with adjacent compressive atelectasis. Scattered smooth septal  thickening consistent with pulmonary edema. There are peribronchovascular  ground-glass opacities in the right perihilar lung and right upper lobe. Associated right greater than left bronchial wall thickening. Upper Abdomen: Water density lesion in the left lobe of the liver is typically cyst. No acute findings in the included upper abdomen. Musculoskeletal: Mild thoracic spondylosis. There are no acute or suspicious osseous abnormalities. Review of the MIP images confirms the above findings. IMPRESSION: 1. No pulmonary embolus. 2. Moderate bilateral pleural effusions with adjacent compressive atelectasis. Smooth septal thickening consistent with pulmonary edema. 3. Peribronchial ground-glass opacities in the right perihilar lung and right upper lobe. Differential considerations include asymmetric alveolar edema, infectious or inflammatory etiology. Consider follow-up after resolution of symptoms to assess for resolution. 4. Bronchial wall thickening, can be seen with bronchitis or congestive. 5. Mild cardiomegaly. Coronary artery calcifications. 6. Small hiatal hernia. Aortic Atherosclerosis (ICD10-I70.0). Electronically Signed   By: Keith Rake M.D.   On: 03/01/2021 17:05   ECHOCARDIOGRAM COMPLETE  Result Date: 03/02/2021    ECHOCARDIOGRAM REPORT   Patient Name:   KELVEN DEETS Date of Exam: 03/02/2021 Medical Rec #:  [id number]  Height:       71.0 in Accession #:    [id number] Weight:       194.2 lb Date of Birth:  01-Oct-1956  BSA:          2.082 m Patient Age:    30 years   BP:           101/73 mmHg Patient Gender: M          HR:           75 bpm. Exam Location:  [facility] Procedure: 2D Echo, Cardiac Doppler and Color Doppler Indications:    Atrial Fibrillation I48.91, Congestive Heart Failure I50.9  History:        Patient has no prior history of Echocardiogram examinations.                 Risk Factors:Hypertension, Diabetes and Dyslipidemia.  Sonographer:    Bernadene Person RDCS Referring Phys: [place]  1. Probable flail posterior MV leaflet; MR is  eccentric and difficult to quantitate; at least moderate and likely severe; suggest TEE to further assess.  2. Left ventricular ejection fraction, by estimation, is 70 to 75%. The left ventricle has hyperdynamic function. The left ventricle has no regional wall motion abnormalities. The left ventricular internal cavity size was mildly dilated. Left ventricular diastolic function could not be evaluated.  3. Right ventricular systolic function is normal. The right ventricular size is normal. Tricuspid regurgitation signal is inadequate for assessing PA pressure.  4. Left atrial size was severely dilated.  5. The mitral valve is normal in structure. No evidence of mitral valve regurgitation. No evidence of mitral stenosis. 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  6. The aortic valve is tricuspid. Aortic valve regurgitation is not visualized. Mild aortic valve sclerosis is present, with no evidence of aortic valve stenosis.  7. Aortic dilatation noted. There is borderline dilatation of the ascending aorta, measuring 38 mm.  8. The inferior vena cava is dilated in size with >50% respiratory variability, suggesting right atrial pressure of 8 mmHg. FINDINGS  Left Ventricle: Left ventricular ejection fraction, by estimation, is 70 to 75%. The left ventricle has hyperdynamic function. The left ventricle has no regional wall motion abnormalities. The left ventricular internal cavity size was mildly dilated. There is no left ventricular  hypertrophy. Left ventricular diastolic function could not be evaluated due to atrial fibrillation. Left ventricular diastolic function could not be evaluated. Right Ventricle: The right ventricular size is normal. Right ventricular systolic function is normal. Tricuspid regurgitation signal is inadequate for assessing PA pressure. The tricuspid regurgitant velocity is 3.02 m/s, and with an assumed right atrial  pressure of 8 mmHg, the estimated right  ventricular systolic pressure is 40.9 mmHg. Left Atrium: Left atrial size was severely dilated. Right Atrium: Right atrial size was normal in size. Pericardium: There is no evidence of pericardial effusion. Mitral Valve: The mitral valve is normal in structure. 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 Mild mitral annular calcification. No evidence of mitral valve regurgitation. No evidence of mitral valve stenosis. Tricuspid Valve: The tricuspid valve is normal in structure. Tricuspid valve regurgitation is trivial. No evidence of tricuspid stenosis. Aortic Valve: The aortic valve is tricuspid. Aortic valve regurgitation is not visualized. Mild aortic valve sclerosis is present, with no evidence of aortic valve stenosis. Pulmonic Valve: The pulmonic valve was not well visualized. Pulmonic valve regurgitation is not visualized. No evidence of pulmonic stenosis. Aorta: Aortic dilatation noted. There is borderline dilatation of the ascending aorta, measuring 38 mm. Venous: The inferior vena cava is dilated in size with greater than 50% respiratory variability, suggesting right atrial pressure of 8 mmHg. IAS/Shunts: No atrial level shunt detected by color flow Doppler. Additional Comments: Probable flail posterior MV leaflet; MR is eccentric and difficult to quantitate; at least moderate and likely severe; suggest TEE to further assess.  LEFT VENTRICLE PLAX 2D LVIDd:         6.07 cm LVIDs:         4.47 cm LV PW:         1.07 cm LV IVS:        1.07 cm LVOT diam:     2.10 cm LV SV:         58 LV SV Index:   28 LVOT Area:     3.46 cm  RIGHT VENTRICLE TAPSE (M-mode): 2.2 cm LEFT ATRIUM              Index       RIGHT ATRIUM           Index LA diam:        6.30 cm  3.03 cm/m  RA Area:     14.90 cm LA Vol (A2C):   111.0 ml 53.30 ml/m RA Volume:   35.70 ml  17.14 ml/m LA Vol (A4C):   114.0 ml 54.74 ml/m LA Biplane Vol: 112.0 ml 53.78 ml/m  AORTIC VALVE LVOT Vmax:   96.67 cm/s LVOT  Vmean:  65.433 cm/s LVOT VTI:    0.167 m  AORTA Ao Root diam: 3.50 cm Ao Asc diam:  3.80 cm MR Peak grad:    51.8 mmHg   TRICUSPID VALVE MR Mean grad:    33.0 mmHg   TR Peak grad:   36.5 mmHg MR Vmax:         360.00 cm/s TR Vmax:        302.00 cm/s MR Vmean:        277.0 cm/s MR PISA:         3.08 cm    SHUNTS MR PISA Eff ROA: 30 mm      Systemic VTI:  0.17 m MR PISA Radius:  0.70 cm     Systemic Diam: 2.10 cm Kirk Ruths MD  Electronically signed by Kirk Ruths MD Signature Date/Time: 03/02/2021/3:18:01 PM    Final      Assessment and Plan:   1. Acute heart failure with preserved EF The patient had presented to the hospital with intermittent chest tightness, worsening shortness of breath and orthopnea (all over the course of a week). In the hospital he was documented to be in AFIB. Exam suggested pulmonary congestion (confirmed on a CXR). He was placed on Eliquis for the AFIB. Diuresed with IV lasix with improvement in his dyspnea.  Echo from 03/02/21 showed eccentric MR (likely severe) with a flail posterior leaflet of the MV. EF normal. PA pressures could not be estimated.  -Based on last dosing of Eliquis, patient will need a RHC/LHC/Coros -Will also schedule a TEE to better delineate the MV anatomy -Strict I and Os -Daily weights -Continue diuresis with IV lasix -Maintain serum K >4.0 and Mg >2.0   2. Paroxysmal atrial fibrillation Patient presents with newly diagnosed AFIB. CHA2DS2- VASc score = 4 (age, HTN, DM and CHF)    -Hold Eliquis in anticipation of cardiac catheterization in the near future -Place on IV heparin in the meantime  The heart rate in the 80s. Will hold off on AV nodal blockers for now.   For questions or updates, please contact [place] Please consult [email or web address] for contact info under    Signed, Meade Maw, MD  03/04/2021 7:53 AM

## 2021-03-05 ENCOUNTER — Encounter (HOSPITAL_COMMUNITY): Payer: Self-pay | Admitting: Family Medicine

## 2021-03-05 ENCOUNTER — Inpatient Hospital Stay (HOSPITAL_COMMUNITY): Payer: Medicare Other | Admitting: Anesthesiology

## 2021-03-05 ENCOUNTER — Inpatient Hospital Stay (HOSPITAL_COMMUNITY): Payer: Medicare Other

## 2021-03-05 ENCOUNTER — Encounter (HOSPITAL_COMMUNITY)
Admission: EM | Disposition: A | Discharge status: Home / Self Care | Payer: Self-pay | Source: Home / Self Care | Attending: Cardiothoracic Surgery

## 2021-03-05 DIAGNOSIS — I340 Nonrheumatic mitral (valve) insufficiency: Secondary | ICD-10-CM

## 2021-03-05 DIAGNOSIS — Z20822 Contact with and (suspected) exposure to covid-19: Secondary | ICD-10-CM | POA: Diagnosis not present

## 2021-03-05 DIAGNOSIS — I5033 Acute on chronic diastolic (congestive) heart failure: Secondary | ICD-10-CM | POA: Diagnosis not present

## 2021-03-05 DIAGNOSIS — Z006 Encounter for examination for normal comparison and control in clinical research program: Secondary | ICD-10-CM | POA: Diagnosis not present

## 2021-03-05 HISTORY — PX: TEE WITHOUT CARDIOVERSION: SHX5443

## 2021-03-05 HISTORY — PX: RIGHT/LEFT HEART CATH AND CORONARY ANGIOGRAPHY: CATH118266

## 2021-03-05 LAB — HEMOGLOBIN A1C
Hgb A1c MFr Bld: 6.7 % — ABNORMAL HIGH (ref 4.8–5.6)
Mean Plasma Glucose: 145.59 mg/dL

## 2021-03-05 LAB — POCT I-STAT EG7
Acid-Base Excess: 0 mmol/L (ref 0.0–2.0)
Acid-Base Excess: 0 mmol/L (ref 0.0–2.0)
Bicarbonate: 24.7 mmol/L (ref 20.0–28.0)
Bicarbonate: 25.2 mmol/L (ref 20.0–28.0)
Calcium, Ion: 1.21 mmol/L (ref 1.15–1.40)
Calcium, Ion: 1.23 mmol/L (ref 1.15–1.40)
HCT: 38 % — ABNORMAL LOW (ref 39.0–52.0)
HCT: 39 % (ref 39.0–52.0)
Hemoglobin: 12.9 g/dL — ABNORMAL LOW (ref 13.0–17.0)
Hemoglobin: 13.3 g/dL (ref 13.0–17.0)
O2 Saturation: 53 %
O2 Saturation: 55 %
Potassium: 4.5 mmol/L (ref 3.5–5.1)
Potassium: 4.5 mmol/L (ref 3.5–5.1)
Sodium: 137 mmol/L (ref 135–145)
Sodium: 137 mmol/L (ref 135–145)
TCO2: 26 mmol/L (ref 22–32)
TCO2: 26 mmol/L (ref 22–32)
pCO2, Ven: 39.9 mmHg — ABNORMAL LOW (ref 44.0–60.0)
pCO2, Ven: 40.3 mmHg — ABNORMAL LOW (ref 44.0–60.0)
pH, Ven: 7.399 (ref 7.250–7.430)
pH, Ven: 7.404 (ref 7.250–7.430)
pO2, Ven: 28 mmHg — CL (ref 32.0–45.0)
pO2, Ven: 29 mmHg — CL (ref 32.0–45.0)

## 2021-03-05 LAB — BASIC METABOLIC PANEL
Anion gap: 6 (ref 5–15)
BUN: 41 mg/dL — ABNORMAL HIGH (ref 8–23)
CO2: 24 mmol/L (ref 22–32)
Calcium: 9.1 mg/dL (ref 8.9–10.3)
Chloride: 103 mmol/L (ref 98–111)
Creatinine, Ser: 1.07 mg/dL (ref 0.61–1.24)
GFR, Estimated: >60 mL/min (ref >60)
Glucose, Bld: 157 mg/dL — ABNORMAL HIGH (ref 70–99)
Potassium: 4.4 mmol/L (ref 3.5–5.1)
Sodium: 133 mmol/L — ABNORMAL LOW (ref 135–145)

## 2021-03-05 LAB — POCT I-STAT 7, (LYTES, BLD GAS, ICA,H+H)
Acid-base deficit: 1 mmol/L (ref 0.0–2.0)
Bicarbonate: 22.4 mmol/L (ref 20.0–28.0)
Calcium, Ion: 1.2 mmol/L (ref 1.15–1.40)
HCT: 37 % — ABNORMAL LOW (ref 39.0–52.0)
Hemoglobin: 12.6 g/dL — ABNORMAL LOW (ref 13.0–17.0)
O2 Saturation: 90 %
Potassium: 4.4 mmol/L (ref 3.5–5.1)
Sodium: 137 mmol/L (ref 135–145)
TCO2: 23 mmol/L (ref 22–32)
pCO2 arterial: 33.4 mmHg (ref 32.0–48.0)
pH, Arterial: 7.436 (ref 7.350–7.450)
pO2, Arterial: 55 mmHg — ABNORMAL LOW (ref 83.0–108.0)

## 2021-03-05 LAB — CBC
HCT: 38.2 % — ABNORMAL LOW (ref 39.0–52.0)
Hemoglobin: 12.8 g/dL — ABNORMAL LOW (ref 13.0–17.0)
MCH: 29.7 pg (ref 26.0–34.0)
MCHC: 33.5 g/dL (ref 30.0–36.0)
MCV: 88.6 fL (ref 80.0–100.0)
Platelets: 340 10*3/uL (ref 150–400)
RBC: 4.31 MIL/uL (ref 4.22–5.81)
RDW: 12.6 % (ref 11.5–15.5)
WBC: 15.4 10*3/uL — ABNORMAL HIGH (ref 4.0–10.5)
nRBC: 0.2 % (ref 0.0–0.2)

## 2021-03-05 LAB — GLUCOSE, CAPILLARY
Glucose-Capillary: 170 mg/dL — ABNORMAL HIGH (ref 70–99)
Glucose-Capillary: 169 mg/dL — ABNORMAL HIGH (ref 70–99)
Glucose-Capillary: 201 mg/dL — ABNORMAL HIGH (ref 70–99)
Glucose-Capillary: 270 mg/dL — ABNORMAL HIGH (ref 70–99)

## 2021-03-05 LAB — ECHO TEE
MV M vel: 3.91 m/s
MV Peak grad: 61.2 mmHg
Radius: 1.7 cm

## 2021-03-05 SURGERY — ECHOCARDIOGRAM, TRANSESOPHAGEAL
Anesthesia: Monitor Anesthesia Care

## 2021-03-05 SURGERY — RIGHT/LEFT HEART CATH AND CORONARY ANGIOGRAPHY
Anesthesia: LOCAL

## 2021-03-05 MED ORDER — IOHEXOL 350 MG/ML SOLN
INTRAVENOUS | Status: DC | PRN
  Administered 2021-03-05: 70 mL

## 2021-03-05 MED ORDER — SODIUM CHLORIDE 0.9 % IV SOLN: 250.0000 mL | INTRAVENOUS | Status: DC | PRN

## 2021-03-05 MED ORDER — POTASSIUM CHLORIDE CRYS ER 20 MEQ PO TBCR
40.0000 meq | EXTENDED_RELEASE_TABLET | Freq: Every day | ORAL | Status: DC
  Administered 2021-03-06 – 2021-03-07 (×2): 40 meq via ORAL
  Filled 2021-03-05 (×2): qty 2

## 2021-03-05 MED ORDER — INSULIN DETEMIR 100 UNIT/ML ~~LOC~~ SOLN
5.0000 [IU] | Freq: Two times a day (BID) | SUBCUTANEOUS | Status: DC
  Administered 2021-03-05 – 2021-03-07 (×5): 5 [IU] via SUBCUTANEOUS
  Filled 2021-03-05 (×6): qty 0.05

## 2021-03-05 MED ORDER — HEPARIN (PORCINE) IN NACL 2-0.9 UNITS/ML
INTRAMUSCULAR | Status: DC | PRN
  Administered 2021-03-05: 10 mL via INTRA_ARTERIAL

## 2021-03-05 MED ORDER — VERAPAMIL HCL 2.5 MG/ML IV SOLN
INTRAVENOUS | Status: AC
  Filled 2021-03-05: qty 2

## 2021-03-05 MED ORDER — FENTANYL CITRATE (PF) 100 MCG/2ML IJ SOLN
INTRAMUSCULAR | Status: DC | PRN
  Administered 2021-03-05: 12.5 ug via INTRAVENOUS

## 2021-03-05 MED ORDER — MIDAZOLAM HCL 2 MG/2ML IJ SOLN
INTRAMUSCULAR | Status: DC | PRN
  Administered 2021-03-05: 0.5 mg via INTRAVENOUS

## 2021-03-05 MED ORDER — HEPARIN SODIUM (PORCINE) 1000 UNIT/ML IJ SOLN
INTRAMUSCULAR | Status: DC | PRN
  Administered 2021-03-05: 4500 [IU] via INTRAVENOUS

## 2021-03-05 MED ORDER — FENTANYL CITRATE (PF) 100 MCG/2ML IJ SOLN
INTRAMUSCULAR | Status: AC
  Filled 2021-03-05: qty 2

## 2021-03-05 MED ORDER — HYDRALAZINE HCL 20 MG/ML IJ SOLN: 10.0000 mg | INTRAMUSCULAR | Status: AC | PRN

## 2021-03-05 MED ORDER — ENOXAPARIN SODIUM 80 MG/0.8ML ~~LOC~~ SOLN
80.0000 mg | Freq: Two times a day (BID) | SUBCUTANEOUS | Status: DC
  Administered 2021-03-06 – 2021-03-07 (×3): 80 mg via SUBCUTANEOUS
  Filled 2021-03-05 (×3): qty 0.8

## 2021-03-05 MED ORDER — SODIUM CHLORIDE 0.9% FLUSH: 3.0000 mL | INTRAVENOUS | Status: DC | PRN

## 2021-03-05 MED ORDER — ENOXAPARIN SODIUM 80 MG/0.8ML ~~LOC~~ SOLN: 80.0000 mg | Freq: Two times a day (BID) | SUBCUTANEOUS | Status: DC

## 2021-03-05 MED ORDER — SODIUM CHLORIDE 0.9% FLUSH
3.0000 mL | Freq: Two times a day (BID) | INTRAVENOUS | Status: DC
  Administered 2021-03-05 – 2021-03-07 (×3): 3 mL via INTRAVENOUS

## 2021-03-05 MED ORDER — SODIUM CHLORIDE 0.9 % IV SOLN: INTRAVENOUS | Status: DC

## 2021-03-05 MED ORDER — LABETALOL HCL 5 MG/ML IV SOLN: 10.0000 mg | INTRAVENOUS | Status: AC | PRN

## 2021-03-05 MED ORDER — INSULIN ASPART 100 UNIT/ML ~~LOC~~ SOLN
0.0000 [IU] | Freq: Three times a day (TID) | SUBCUTANEOUS | Status: DC
  Administered 2021-03-05: 1 [IU] via SUBCUTANEOUS
  Administered 2021-03-05: 2 [IU] via SUBCUTANEOUS
  Administered 2021-03-06: 3 [IU] via SUBCUTANEOUS
  Administered 2021-03-06 – 2021-03-07 (×4): 1 [IU] via SUBCUTANEOUS
  Administered 2021-03-07: 3 [IU] via SUBCUTANEOUS
  Administered 2021-03-08: 1 [IU] via SUBCUTANEOUS

## 2021-03-05 MED ORDER — HEPARIN (PORCINE) IN NACL 1000-0.9 UT/500ML-% IV SOLN
INTRAVENOUS | Status: DC | PRN
  Administered 2021-03-05 (×2): 500 mL

## 2021-03-05 MED ORDER — SODIUM CHLORIDE 0.9% FLUSH
3.0000 mL | Freq: Two times a day (BID) | INTRAVENOUS | Status: DC
  Administered 2021-03-05: 3 mL via INTRAVENOUS

## 2021-03-05 MED ORDER — HEPARIN SODIUM (PORCINE) 1000 UNIT/ML IJ SOLN
INTRAMUSCULAR | Status: AC
  Filled 2021-03-05: qty 1

## 2021-03-05 MED ORDER — PROPOFOL 500 MG/50ML IV EMUL
INTRAVENOUS | Status: DC | PRN
  Administered 2021-03-05: 100 ug/kg/min via INTRAVENOUS

## 2021-03-05 MED ORDER — HEPARIN (PORCINE) IN NACL 1000-0.9 UT/500ML-% IV SOLN
INTRAVENOUS | Status: AC
  Filled 2021-03-05: qty 1000

## 2021-03-05 MED ORDER — LIDOCAINE HCL (PF) 1 % IJ SOLN
INTRAMUSCULAR | Status: AC
  Filled 2021-03-05: qty 30

## 2021-03-05 MED ORDER — BUTAMBEN-TETRACAINE-BENZOCAINE 2-2-14 % EX AERO
INHALATION_SPRAY | CUTANEOUS | Status: DC | PRN
  Administered 2021-03-05: 2 via TOPICAL

## 2021-03-05 MED ORDER — ASPIRIN 81 MG PO CHEW
81.0000 mg | CHEWABLE_TABLET | ORAL | Status: AC
  Administered 2021-03-05: 81 mg via ORAL
  Filled 2021-03-05: qty 1

## 2021-03-05 MED ORDER — MIDAZOLAM HCL 2 MG/2ML IJ SOLN
INTRAMUSCULAR | Status: AC
  Filled 2021-03-05: qty 2

## 2021-03-05 MED ORDER — LIDOCAINE HCL (PF) 1 % IJ SOLN
INTRAMUSCULAR | Status: DC | PRN
  Administered 2021-03-05 (×2): 2 mL

## 2021-03-05 SURGICAL SUPPLY — 13 items
CATH OPTITORQUE TIG 4.0 5F (CATHETERS) ×2 IMPLANT
CATH SWAN GANZ 7F STRAIGHT (CATHETERS) ×2 IMPLANT
DEVICE RAD COMP TR BAND LRG (VASCULAR PRODUCTS) ×2 IMPLANT
GLIDESHEATH SLEND SS 6F .021 (SHEATH) ×2 IMPLANT
GLIDESHEATH SLENDER 7FR .021G (SHEATH) ×2 IMPLANT
GUIDEWIRE .025 260CM (WIRE) ×2 IMPLANT
GUIDEWIRE INQWIRE 1.5J.035X260 (WIRE) ×1 IMPLANT
INQWIRE 1.5J .035X260CM (WIRE) ×2
KIT HEART LEFT (KITS) ×2 IMPLANT
PACK CARDIAC CATHETERIZATION (CUSTOM PROCEDURE TRAY) ×2 IMPLANT
SHEATH PROBE COVER 6X72 (BAG) ×2 IMPLANT
TRANSDUCER W/STOPCOCK (MISCELLANEOUS) ×2 IMPLANT
TUBING CIL FLEX 10 FLL-RA (TUBING) ×2 IMPLANT

## 2021-03-05 NOTE — Anesthesia Preprocedure Evaluation (Signed)
Anesthesia Evaluation  Patient identified by MRN, date of birth, ID band Patient awake    Reviewed: Allergy & Precautions, NPO status , Patient's Chart, lab work & pertinent test results  Airway Mallampati: II  TM Distance: >3 FB Neck ROM: Full    Dental  (+) Teeth Intact, Poor Dentition   Pulmonary former smoker,     + decreased breath sounds      Cardiovascular hypertension,  Rhythm:Irregular Rate:Normal + Systolic murmurs    Neuro/Psych    GI/Hepatic   Endo/Other  diabetes  Renal/GU      Musculoskeletal   Abdominal   Peds  Hematology   Anesthesia Other Findings   Reproductive/Obstetrics                             Anesthesia Physical Anesthesia Plan  ASA: III  Anesthesia Plan: MAC   Post-op Pain Management:    Induction: Intravenous  PONV Risk Score and Plan: Ondansetron and Propofol infusion  Airway Management Planned: Nasal Cannula  Additional Equipment:   Intra-op Plan:   Post-operative Plan:   Informed Consent: I have reviewed the patients History and Physical, chart, labs and discussed the procedure including the risks, benefits and alternatives for the proposed anesthesia with the patient or authorized representative who has indicated his/her understanding and acceptance.       Plan Discussed with: Anesthesiologist and CRNA  Anesthesia Plan Comments:         Anesthesia Quick Evaluation

## 2021-03-05 NOTE — CV Procedure (Signed)
     TRANSESOPHAGEAL ECHOCARDIOGRAM   NAME:  Michael Valenzuela   MRN: [id number] DOB:  1956/06/11   ADMIT DATE: 03/01/2021  INDICATIONS: MR  PROCEDURE:   Informed consent was obtained prior to the procedure. The risks, benefits and alternatives for the procedure were discussed and the patient comprehended these risks.  Risks include, but are not limited to, cough, sore throat, vomiting, nausea, somnolence, esophageal and stomach trauma or perforation, bleeding, low blood pressure, aspiration, pneumonia, infection, trauma to the teeth and death.    After a procedural time-out, the oropharynx was anesthetized and the patient was sedated by the anesthesia service. The transesophageal probe was inserted in the esophagus and stomach without difficulty and multiple views were obtained. Anesthesia was monitored by Dr Linna Caprice and Michael Darby, CRNA.    COMPLICATIONS:    SpO2 down to 70s during procedure, improved to baseline low 90s following procedure  FINDINGS:  Flail P2 with severe MR  Oswaldo Milian MD [facility]  [address] [place], [place] [address] [phone]   8:52 AM

## 2021-03-05 NOTE — Progress Notes (Signed)
  Echocardiogram Echocardiogram Transesophageal has been performed.  Michael Valenzuela 03/05/2021, 9:06 AM

## 2021-03-05 NOTE — Transfer of Care (Signed)
Immediate Anesthesia Transfer of Care Note  Patient: Michael Valenzuela  Procedure(s) Performed: TRANSESOPHAGEAL ECHOCARDIOGRAM (TEE) (N/A )  Patient Location: PACU and Endoscopy Unit  Anesthesia Type:MAC  Level of Consciousness: patient cooperative and responds to stimulation  Airway & Oxygen Therapy: Patient Spontanous Breathing and Patient connected to nasal cannula oxygen  Post-op Assessment: Report given to RN, Post -op Vital signs reviewed and stable and Patient moving all extremities X 4  Post vital signs: Reviewed and stable  Last Vitals:  Vitals Value Taken Time  BP    Temp    Pulse 110 03/05/21 0847  Resp 17 03/05/21 0847  SpO2 93 % 03/05/21 0847  Vitals shown include unvalidated device data.  Last Pain:  Vitals:   03/05/21 0715  TempSrc: Oral  PainSc: 5       Patients Stated Pain Goal: 0 (93/96/88 6484)  Complications: No complications documented.

## 2021-03-05 NOTE — H&P (View-Only) (Signed)
Progress Note  Patient Name: Michael Valenzuela Date of Encounter: 03/05/2021  [facility] Cardiologist: No primary care provider on file.  He is from [place].  Subjective   Feeling better, no significant shortness of breath.  Inpatient Medications    Scheduled Meds: . albuterol  2.5 mg Nebulization BID  . amLODipine  5 mg Oral Daily  . apixaban  5 mg Oral BID  . fenofibrate  160 mg Oral Daily  . furosemide  40 mg Intravenous Daily  . guaiFENesin  600 mg Oral BID  . insulin aspart  0-5 Units Subcutaneous QHS  . insulin aspart  0-6 Units Subcutaneous TID WC  . insulin detemir  15 Units Subcutaneous QHS  . metoprolol tartrate  25 mg Oral BID  . oxyCODONE  15 mg Oral Q6H  . pantoprazole  40 mg Oral Daily  . potassium chloride  40 mEq Oral BID  . rosuvastatin  10 mg Oral Daily  . sodium chloride flush  3 mL Intravenous Q12H  . traZODone  100 mg Oral QHS   Continuous Infusions: . sodium chloride     PRN Meds: sodium chloride, acetaminophen, albuterol, cyclobenzaprine, ondansetron (ZOFRAN) IV, sodium chloride flush   Vital Signs    Vitals:   03/05/21 0855 03/05/21 0900 03/05/21 0904 03/05/21 0930  BP:  105/79 110/77 113/77  Pulse: 76 87 81 69  Resp: 20 18 17 18   Temp:      TempSrc:      SpO2: 93% 93% 94% 95%  Weight:      Height:        Intake/Output Summary (Last 24 hours) at 03/05/2021 1010 Last data filed at 03/05/2021 0841 Gross per 24 hour  Intake 1240 ml  Output 1150 ml  Net 90 ml   Last 3 Weights 03/05/2021 03/04/2021 03/04/2021  Weight (lbs) 190 lb 14.4 oz 191 lb 1.6 oz 192 lb 0.3 oz  Weight (kg) 86.592 kg 86.682 kg 87.1 kg      Telemetry    Atrial fibrillation, occasional slow ventricular response- Personally Reviewed  ECG    Atrial fibrillation- Personally Reviewed  Physical Exam   GEN: No acute distress.   Neck: No JVD Cardiac:  Irregularly irregular normal rate, 3/6 holosystolic murmur, no rubs, or gallops.  Respiratory: Clear to  auscultation bilaterally. GI: Soft, nontender, non-distended  MS: No edema; No deformity. Neuro:  Nonfocal  Psych: Normal affect   Labs    High Sensitivity Troponin:   Recent Labs  Lab 03/01/21 1343 03/01/21 1602  TROPONINIHS 11 11      Chemistry Recent Labs  Lab 03/01/21 1343 03/02/21 0512 03/03/21 0503 03/04/21 0358 03/05/21 0409  NA 136   < > 134* 135 133*  K 3.9   < > 4.1 4.4 4.4  CL 104   < > 102 101 103  CO2 22   < > 21* 22 24  GLUCOSE 139*   < > 189* 204* 157*  BUN 24*   < > 29* 38* 41*  CREATININE 0.95   < > 0.78 0.98 1.07  CALCIUM 9.3   < > 9.4 9.5 9.1  PROT 7.4  --   --   --   --   ALBUMIN 4.0  --   --   --   --   AST 26  --   --   --   --   ALT 20  --   --   --   --   ALKPHOS 48  --   --   --   --  BILITOT 0.7  --   --   --   --   GFRNONAA >60   < > >60 >60 >60  ANIONGAP 10   < > 11 12 6    < > = values in this interval not displayed.     Hematology Recent Labs  Lab 03/01/21 1343 03/05/21 0409  WBC 10.0 15.4*  RBC 4.58 4.31  HGB 13.9 12.8*  HCT 41.2 38.2*  MCV 90.0 88.6  MCH 30.3 29.7  MCHC 33.7 33.5  RDW 12.5 12.6  PLT 375 340    BNP Recent Labs  Lab 03/01/21 1343  BNP 301.0*     DDimer  Recent Labs  Lab 03/01/21 1343  DDIMER 1.19*    Patient Profile     65 y.o. male with severe mitral valve regurgitation on TEE awaiting right and left heart catheterization  Assessment & Plan    Severe mitral regurgitation - After heart catheterization we will initiate cardiothoracic surgery consultation.  I will go ahead and get this called in.  -Responded well to IV Lasix.  Shortness of breath has improved.  Awaiting right and left heart catheterization.  Diabetes mellitus with hypertension - Currently stable.  Per primary team.  Persistent atrial fibrillation - Newly diagnosed.  Off of anticoagulation for heart catheterization.  Unable to perform cardioversion at this point. - Consider maze if mitral valve surgery is  performed. -Heart rate well controlled.   For questions or updates, please contact [place] Please consult [email or web address] for contact info under        Signed, Candee Furbish, MD  03/05/2021, 10:11 AM

## 2021-03-05 NOTE — Progress Notes (Signed)
ANTICOAGULATION CONSULT NOTE - Initial Consult  Pharmacy Consult for apixaban and enoxaparin Indication: atrial fibrillation  No Known Allergies  Patient Measurements: Height: 6' (182.9 cm) Weight: 86.6 kg (190 lb 14.4 oz) IBW/kg (Calculated) : 77.6   Vital Signs: Temp: 98.5 F (36.9 C) (04/26 1035) Temp Source: Oral (04/26 1035) BP: 107/89 (04/26 1035) Pulse Rate: 66 (04/26 1035)  Labs: Recent Labs    03/03/21 0503 03/04/21 0358 03/05/21 0409  HGB  --   --  12.8*  HCT  --   --  38.2*  PLT  --   --  340  CREATININE 0.78 0.98 1.07    Estimated Creatinine Clearance: 75.5 mL/min (by C-G formula based on SCr of 1.07 mg/dL).   Medical History: Past Medical History:  Diagnosis Date  . Chronic low back pain   . Diabetes mellitus without complication ([place])   . Fibromyalgia   . Hyperlipidemia   . Hypertension   . Vitamin D deficiency        Assessment: 65 yo M with severe mitral regurgitation awaiting TCTS consult. Pharmacy consulted for apixaban, LD 4/24 AM. Discussed with Cardiology, will start treatment dose enoxaparin for afib 8 hours after cath (currently scheduled for 16:20 today) and hold apixaban until TCTS consult.   Goal of Therapy:  Monitor platelets by anticoagulation protocol: Yes   Plan:  Start enoxaparin 80mg  q12hr starting 4/27 at 5am Monitor for signs/symptoms of bleeding  F/u TCTS consult F/u when to start apixaban   Benetta Spar, PharmD, BCPS, [place] Pharmacist  Please check AMION for all [place] phone numbers After 10:00 PM, call [place] [phone]

## 2021-03-05 NOTE — Progress Notes (Signed)
Called [place], [place] on [address].  Gave report on this pt.  Pt will be going to cath lab prior to returning to the unit

## 2021-03-05 NOTE — Interval H&P Note (Signed)
History and Physical Interval Note:  03/05/2021 7:48 AM  Michael Valenzuela  has presented today for surgery, with the diagnosis of LOOK AT MITRAL VALVE.  The various methods of treatment have been discussed with the patient and family. After consideration of risks, benefits and other options for treatment, the patient has consented to  Procedure(s): TRANSESOPHAGEAL ECHOCARDIOGRAM (TEE) (N/A) as a surgical intervention.  The patient's history has been reviewed, patient examined, no change in status, stable for surgery.  I have reviewed the patient's chart and labs.  Questions were answered to the patient's satisfaction.     Donato Heinz

## 2021-03-05 NOTE — Discharge Instructions (Addendum)
TCTS officenumber [id number]  Mitral Valve Replacement, Care After This sheet gives you information about how to care for yourself after your procedure. Your health care provider may also give you more specific instructions. If you have problems or questions, contact your health care provider. What can I expect after the procedure? After the procedure, it is common to have pain at the incision area. This may last for several weeks. Follow these instructions at home: Incision care  Follow instructions from your health care provider about how to take care of your incision. Make sure you: ? Wash your hands with soap and water before and after you change your bandage (dressing). If soap and water are not available, use hand sanitizer. ? Change your dressing as told by your health care provider. ? Leave stitches (sutures), skin glue, or adhesive strips in place. These skin closures may need to stay in place for 2 weeks or longer. If adhesive strip edges start to loosen and curl up, you may trim the loose edges. Do not remove adhesive strips completely unless your health care provider tells you to do that.  Check your incision area every day for signs of infection. Check for: ? Redness, swelling, or pain. ? Fluid or blood. ? Warmth. ? Pus or a bad smell.  Do not apply powder or lotion to the area.   Bathing  Do not take baths, swim, or use a hot tub until your health care provider approves. Uou may take a hower  To wash the incision site, gently wash with soap and water and pat the area dry with a clean towel. Do not rub the incision area. That may cause bleeding. Activity  Rest as told by your health care provider.  Avoid sitting for a long time without moving, and avoid crossing your legs. Get up to take short walks every 1-2 hours. This is important to improve blood flow and breathing. Ask for help if you feel weak or unsteady.  Return to your normal activities as told by your health  care provider. Ask your health care provider what activities are safe for you.  Avoid the following activities for 6-8 weeks, or as long as directed: ? Lifting anything that is heavier than 10 lb (4.5 kg), or the limit that you are told. ? Pushing or pulling things with your arms.  Avoid climbing stairs and using the handrail to pull yourself up for the first 2-3 weeks after surgery.  Avoid airplane travel for 4-6 weeks, or as long as directed.  If you are taking blood thinners (anticoagulants), avoid activities that have a high risk of injury. Ask your health care provider what activities are safe for you. Medicines  Take over-the-counter and prescription medicines only as told by your health care provider.  If you are taking blood thinners: ? Talk with your health care provider before you take any medicines that contain aspirin or NSAIDs. These medicines increase your risk for dangerous bleeding. ? Take your medicine exactly as told, at the same time every day. ? Avoid activities that could cause injury or bruising. ? Follow instructions about how to prevent falls. ? Wear a medical alert bracelet or carry a card that lists what medicines you take.  Ask your health care provider if the medicine prescribed to you: ? Requires you to avoid driving or using heavy machinery. ? Can cause constipation. You may need to take actions to prevent or treat constipation, such as:  Drink enough fluid to keep  your urine pale yellow.  Take over-the-counter or prescription medicines.  Eat foods that are high in fiber, such as beans, whole grains, and fresh fruits and vegetables.  Limit foods that are high in fat and processed sugars, such as fried or sweet foods. General instructions  Take your temperature every day and weigh yourself every morning for the first 7 days after surgery. Write your temperatures and weight down and take this record with you to any follow-up visits.  Wear compression  stockings as told by your health care provider. These stockings may help to prevent blood clots and reduce swelling in your legs. You may be asked to wear these stockings for at least 2 weeks after your procedure. If your ankles are swollen after 2 weeks, contact your health care provider to see if you should continue to wear the stockings.  Follow instructions from your health care provider about eating or drinking restrictions.  Do not drink alcohol until your health care provider approves.  Do not drive until your health care provider approves.  Do not use any products that contain nicotine or tobacco, such as cigarettes, e-cigarettes, and chewing tobacco. If you need help quitting, ask your health care provider.  Keep all follow-up visits as told by your health care provider. This is important.   Contact a health care provider if:  You develop a skin rash.  Your weight is increasing each day over 2-3 days.  You gain 2 lb (1 kg) or more in a single day. Get help right away if:  You develop chest pain that feels different from the pain caused by your incision.  You develop shortness of breath or difficulty breathing.  You have a fever.  You have redness, swelling, or pain around your incision.  You have fluid or blood coming from your incision.  Your incision feels warm to the touch.  You have pus or a bad smell coming from your incision.  You feel light-headed. Summary  After the procedure, it is common to have pain at the incision area. This may last for several weeks.  Take your temperature every day and weigh yourself every morning for the first 7 days after surgery.  Check your incision area every day for signs of infection.  Keep all follow-up visits as told by your health care provider. This is important. This information is not intended to replace advice given to you by your health care provider. Make sure you discuss any questions you have with your health care  provider. Document Revised: 07/20/2018 Document Reviewed: 07/20/2018 Elsevier Patient Education  2021 [place].   Atrial Fibrillation    Atrial fibrillation is a type of heartbeat that is irregular or fast. If you have this condition, your heart beats without any order. This makes it hard for your heart to pump blood in a normal way. Atrial fibrillation may come and go, or it may become a long-lasting problem. If this condition is not treated, it can put you at higher risk for stroke, heart failure, and other heart problems.  What are the causes? This condition may be caused by diseases that damage the heart. They include:  High blood pressure.  Heart failure.  Heart valve disease.  Heart surgery. Other causes include:  Diabetes.  Thyroid disease.  Being overweight.  Kidney disease. Sometimes the cause is not known.  What increases the risk? You are more likely to develop this condition if:  You are older.  You smoke.  You exercise  often and very hard.  You have a family history of this condition.  You are a man.  You use drugs.  You drink a lot of alcohol.  You have lung conditions, such as emphysema, pneumonia, or COPD.  You have sleep apnea.   What are the signs or symptoms? Common symptoms of this condition include:  A feeling that your heart is beating very fast.  Chest pain or discomfort.  Feeling short of breath.  Suddenly feeling light-headed or weak.  Getting tired easily during activity.  Fainting.  Sweating. In some cases, there are no symptoms.  How is this treated? Treatment for this condition depends on underlying conditions and how you feel when you have atrial fibrillation. They include: 1. Medicines to: ? Prevent blood clots. ? Treat heart rate or heart rhythm problems. 2. Using devices, such as a pacemaker, to correct heart rhythm problems. 3. Doing surgery to remove the part of the heart that sends bad  signals. 4. Closing an area where clots can form in the heart (left atrial appendage). In some cases, your doctor will treat other underlying conditions.  Follow these instructions at home:  Medicines 1. Take over-the-counter and prescription medicines only as told by your doctor. 2. Do not take any new medicines without first talking to your doctor. 3. If you are taking blood thinners: ? Talk with your doctor before you take any medicines that have aspirin or NSAIDs, such as ibuprofen, in them. ? Take your medicine exactly as told by your doctor. Take it at the same time each day. ? Avoid activities that could hurt or bruise you. Follow instructions about how to prevent falls. ? Wear a bracelet that says you are taking blood thinners. Or, carry a card that lists what medicines you take. Lifestyle          Do not use any products that have nicotine or tobacco in them. These include cigarettes, e-cigarettes, and chewing tobacco. If you need help quitting, ask your doctor.  Eat heart-healthy foods. Talk with your doctor about the right eating plan for you.  Exercise regularly as told by your doctor.  Do not drink alcohol.  Lose weight if you are overweight.  Do not use drugs, including cannabis.  General instructions  If you have a condition that causes breathing to stop for a short period of time (apnea), treat it as told by your doctor.  Keep a healthy weight. Do not use diet pills unless your doctor says they are safe for you. Diet pills may make heart problems worse.  Keep all follow-up visits as told by your doctor. This is important.  Contact a doctor if:  You notice a change in the speed, rhythm, or strength of your heartbeat.  You are taking a blood-thinning medicine and you get more bruising.  You get tired more easily when you move or exercise.  You have a sudden change in weight.  Get help right away if:    1. You have pain in your chest or your belly  (abdomen). 2. You have trouble breathing. 3. You have side effects of blood thinners, such as blood in your vomit, poop (stool), or pee (urine), or bleeding that cannot stop. 4. You have any signs of a stroke. "BE FAST" is an easy way to remember the main warning signs: ? B - Balance. Signs are dizziness, sudden trouble walking, or loss of balance. ? E - Eyes. Signs are trouble seeing or a change in how  you see. ? F - Face. Signs are sudden weakness or loss of feeling in the face, or the face or eyelid drooping on one side. ? A - Arms. Signs are weakness or loss of feeling in an arm. This happens suddenly and usually on one side of the body. ? S - Speech. Signs are sudden trouble speaking, slurred speech, or trouble understanding what people say. ? T - Time. Time to call emergency services. Write down what time symptoms started. 5. You have other signs of a stroke, such as: ? A sudden, very bad headache with no known cause. ? Feeling like you may vomit (nausea). ? Vomiting. ? A seizure.  These symptoms may be an emergency. Do not wait to see if the symptoms will go away. Get medical help right away. Call your local emergency services (911 in the [place] Do not drive yourself to the hospital. Summary  Atrial fibrillation is a type of heartbeat that is irregular or fast.  You are at higher risk of this condition if you smoke, are older, have diabetes, or are overweight.  Follow your doctor's instructions about medicines, diet, exercise, and follow-up visits.  Get help right away if you have signs or symptoms of a stroke.  Get help right away if you cannot catch your breath, or you have chest pain or discomfort. This information is not intended to replace advice given to you by your health care provider. Make sure you discuss any questions you have with your health care provider. Document Revised: 04/20/2019 Document Reviewed: 04/20/2019 Elsevier Patient Education  [place] on my medicine - ELIQUIS (apixaban)   Why was Eliquis prescribed for you? Eliquis was prescribed for you to reduce the risk of a blood clot forming after heart valve surgery  What do You need to know about Eliquis ? Take your Eliquis TWICE DAILY - one tablet in the morning and one tablet in the evening with or without food. If you have difficulty swallowing the tablet whole please discuss with your pharmacist how to take the medication safely.  Take Eliquis exactly as prescribed by your doctor and DO NOT stop taking Eliquis without talking to the doctor who prescribed the medication.  Stopping may increase your risk of developing a stroke.  Refill your prescription before you run out.  After discharge, you should have regular check-up appointments with your healthcare provider that is prescribing your Eliquis.  In the future your dose may need to be changed if your kidney function or weight changes by a significant amount or as you get older.  What do you do if you miss a dose? If you miss a dose, take it as soon as you remember on the same day and resume taking twice daily.  Do not take more than one dose of ELIQUIS at the same time to make up a missed dose.  Important Safety Information A possible side effect of Eliquis is bleeding. You should call your healthcare provider right away if you experience any of the following: ? Bleeding from an injury or your nose that does not stop. ? Unusual colored urine (red or dark brown) or unusual colored stools (red or black). ? Unusual bruising for unknown reasons. ? A serious fall or if you hit your head (even if there is no bleeding).  Some medicines may interact with Eliquis and might increase your risk of bleeding or clotting while on Eliquis. To help avoid this, consult your healthcare provider  or pharmacist prior to using any new prescription or non-prescription medications, including herbals, vitamins, non-steroidal  anti-inflammatory drugs (NSAIDs) and supplements.  This website has more information on Eliquis (apixaban): http://www.eliquis.com/eliquis/home

## 2021-03-05 NOTE — Progress Notes (Signed)
Patient c/o shortness of breath oxygen saturation 93-97%/2L pt denies pain. Pt. Stated he is anxious of everything going on [facility] MD notified no new order given also patient does not want to take his lasix until after cardiac cath.will continue to monitor

## 2021-03-05 NOTE — Anesthesia Postprocedure Evaluation (Signed)
Anesthesia Post Note  Patient: Michael Valenzuela  Procedure(s) Performed: TRANSESOPHAGEAL ECHOCARDIOGRAM (TEE) (N/A )     Patient location during evaluation: Endoscopy Anesthesia Type: MAC Level of consciousness: awake and alert Pain management: pain level controlled Vital Signs Assessment: post-procedure vital signs reviewed and stable Respiratory status: spontaneous breathing, nonlabored ventilation, respiratory function stable and patient connected to nasal cannula oxygen Cardiovascular status: stable and blood pressure returned to baseline Postop Assessment: no apparent nausea or vomiting Anesthetic complications: no   No complications documented.  Last Vitals:  Vitals:   03/05/21 1410 03/05/21 1415  BP: 107/76 107/83  Pulse: (!) 55 90  Resp:  (!) 22  Temp: 36.7 C 36.7 C  SpO2: 90% 90%    Last Pain:  Vitals:   03/05/21 1444  TempSrc:   PainSc: 5                  Irving Bloor COKER

## 2021-03-05 NOTE — Progress Notes (Signed)
Pt refuses to sign consent forms until he speaks with an MD about the procedures. Consent forms filled out and in pt's chart, but not yet signed.

## 2021-03-05 NOTE — Interval H&P Note (Signed)
History and Physical Interval Note:  03/05/2021 1:14 PM  Michael Valenzuela  has presented today for surgery, with the diagnosis of chest pain.  The various methods of treatment have been discussed with the patient and family. After consideration of risks, benefits and other options for treatment, the patient has consented to  Procedure(s): RIGHT/LEFT HEART CATH AND CORONARY ANGIOGRAPHY (N/A) as a surgical intervention.  The patient's history has been reviewed, patient examined, no change in status, stable for surgery.  I have reviewed the patient's chart and labs.  Questions were answered to the patient's satisfaction.     Glenetta Hew

## 2021-03-05 NOTE — Progress Notes (Signed)
PROGRESS NOTE    Michael Valenzuela  RCV:[id number] DOB: [birth year] DOA: 03/01/2021 PCP: Michael Pretty, FNP   Chief Complaint  Patient presents with  . Shortness of Breath    Brief Narrative:  65 y.o.malewith Michael Valenzuela history of diabetes, fibromyalgia, hyperlipidemia and hypertension admitted on 03/01/2021 with acute hypoxic respiratory failure due heart failure.  He was initially admitted to [facility] and treated with diuretics for HF as well as received nebs/steroids for possible bronchitis.  An echo was notable for probably flair posterior MV leaflet, at least moderate and likely severe MR.  Cardiology was consulted.  He was transferred to [facility] for Michael Valenzuela which showed severe mitral valve regurgitation.  Michael Valenzuela right and left heart cath was performed which showed normal coronary arteries.  TCTS was consulted for mitral valve surgery.    Assessment & Plan:   Principal Problem:   Acute on chronic heart failure with preserved ejection fraction (HFpEF)/in the setting of severe mitral valve prolapse Active Problems:   Fibromyalgia   Essential hypertension, benign   Hyperlipidemia with target LDL less than 100   Type 2 diabetes mellitus with other specified complication (HCC)   Pleural effusion   Atrial fibrillation (HCC)   Mitral valve posterior leaflet prolapse/-severe holosystolic prolapse of posterior leaflet of the mitral valve   Dyspnea and respiratory abnormalities   Mitral valve insufficiency  Acute Hypoxic Respiratory Failure  Severe Mitral Valve Regurgitation  HFpEF Currently satting well on 2 L CT PE protocol with moderate bilateral effusions with compressive atelectasis, smooth septal thickening c/w pulmonary edema.  Peribronchial ground glass opacities in R perihilar lung and right upper lobe (see report) Valenzuela today shows severe MV regurgitation (see report) Cath shows angiographically normal coronary arteries, mild pulmonary hypertension with mean PAP 30 mmHg, PCWP 5 mmHg with LVEDP  15 mmHg.  Mild to moderately reduced cardiac output.  CT surgery has been consulted Diuresis per cardiology Strict I/O, daily weights  Atrial Fibrillation New diagnosis Lovenox post cath Per cards TSH wnl, echo as above  Concern for bronchitis  Abnormal CT scan CT with moderate effusions and pulmonary edema - peribronchial ground glass opacities.  Needs follow up until resolution.  Also bronchial wall thickening. S/p steroids, now d/c'd.   Polypharmacy Was on amlodipine and felodipine - prior to admission, felodipine should be d/c'd at discharge (just 1 CCB*) Now on lasix, HCTZ d/c'd (loop or thiazide diuretic, not both at discharge)  HLD  Continue crestor and fenofibrate  T2DM Takes januvia at home and meformin Basal, SSI and follow inpatient - adjust prn   DVT prophylaxis: lovenox Code Status: full  Family Communication: none at bedside Disposition:   Status is: Inpatient  Remains inpatient appropriate because:Inpatient level of care appropriate due to severity of illness   Dispo: The patient is from: Home              Anticipated d/c is to: pending              Patient currently is not medically stable to d/c.   Difficult to place patient No   Consultants:   Cardiology  CT surgery  Procedures:  Cath  The left ventricular systolic function is normal. The left ventricular ejection fraction is 55-65% by visual estimate.  LV end diastolic pressure is mildly elevated.  There is moderate (3+) mitral regurgitation.  Hemodynamic findings consistent with mild pulmonary hypertension.  Angiographically normal coronary arteries   SUMMARY  Angiographically Normal Coronary Arteries  Mild Pulmonary Hypertension with mean  PAP 30 mmHg, PCWP 5 mmHg with LVEDP 15 mmHg.  Mild-moderately reduced Cardiac Output-Cardiac Index (Fick) 4.44-2.12.  RECOMMENDATIONS  Continue with evaluation for mitral valve surgery  Valenzuela IMPRESSIONS    1. Left ventricular ejection  fraction, by estimation, is 60 to 65%. The  left ventricle has normal function.  2. Right ventricular systolic function is normal. The right ventricular  size is mildly enlarged.  3. Left atrial size was severely dilated. No left atrial/left atrial  appendage thrombus was detected.  4. Right atrial size was mildly dilated.  5. The aortic valve is tricuspid. Aortic valve regurgitation is not  visualized.  6. The mitral valve is abnormal. Flail P2. Severe mitral valve  regurgitation. MR severe by both 2D ERO and 3D VCA. Systolic flow reversal  in pulmonary veins, consistent with severe MR.  Echo IMPRESSIONS    1. Probable flail posterior MV leaflet; MR is eccentric and difficult to  quantitate; at least moderate and likely severe; suggest Valenzuela to further  assess.  2. Left ventricular ejection fraction, by estimation, is 70 to 75%. The  left ventricle has hyperdynamic function. The left ventricle has no  regional wall motion abnormalities. The left ventricular internal cavity  size was mildly dilated. Left  ventricular diastolic function could not be evaluated.  3. Right ventricular systolic function is normal. The right ventricular  size is normal. Tricuspid regurgitation signal is inadequate for assessing  PA pressure.  4. Left atrial size was severely dilated.  5. The mitral valve is normal in structure. No evidence of mitral valve  regurgitation. No evidence of mitral stenosis. There is severe  holosystolic prolapse of posterior leaflet of the mitral valve.  6. The aortic valve is tricuspid. Aortic valve regurgitation is not  visualized. Mild aortic valve sclerosis is present, with no evidence of  aortic valve stenosis.  7. Aortic dilatation noted. There is borderline dilatation of the  ascending aorta, measuring 38 mm.  8. The inferior vena cava is dilated in size with >50% respiratory  variability, suggesting right atrial pressure of 8 mmHg.   Antimicrobials:   Anti-infectives (From admission, onward)   None     Subjective: C/o SOB Feels rotten, no sleep, nothing to eat - had 1 procedure and 1 to go  Objective: Vitals:   03/05/21 1345 03/05/21 1350 03/05/21 1410 03/05/21 1415  BP: 113/75 105/74 107/76 107/83  Pulse: 85 78 (!) 55 90  Resp: (!) 22 (!) 36    Temp:   98.1 F (36.7 C) 98.1 F (36.7 C)  TempSrc:   Oral Oral  SpO2: (!) 89% (!) 88% 90% 90%  Weight:      Height:        Intake/Output Summary (Last 24 hours) at 03/05/2021 1523 Last data filed at 03/05/2021 0841 Gross per 24 hour  Intake 440 ml  Output 700 ml  Net -260 ml   Filed Weights   03/04/21 0525 03/04/21 1506 03/05/21 0325  Weight: 87.1 kg 86.7 kg 86.6 kg    Examination:  General exam: Appears calm and comfortable  Respiratory system: Clear to auscultation. Respiratory effort normal. Cardiovascular system: irregularly irregular - Systolic murmur  Gastrointestinal system: Abdomen is nondistended, soft and nontender.  Central nervous system: Alert and oriented. No focal neurological deficits. Extremities: no LEE Skin: No rashes, lesions or ulcers Psychiatry: Judgement and insight appear normal. Mood & affect appropriate.     Data Reviewed: I have personally reviewed following labs and imaging studies  CBC: Recent Labs  Lab 03/01/21 1343 03/05/21 0409 03/05/21 1327 03/05/21 1334  WBC 10.0 15.4*  --   --   HGB 13.9 12.8* 12.6* 12.9*  13.3  HCT 41.2 38.2* 37.0* 38.0*  39.0  MCV 90.0 88.6  --   --   PLT 375 340  --   --     Basic Metabolic Panel: Recent Labs  Lab 03/01/21 1343 03/02/21 0512 03/03/21 0503 03/04/21 0358 03/05/21 0409 03/05/21 1327 03/05/21 1334  NA 136 137 134* 135 133* 137 137  137  K 3.9 4.2 4.1 4.4 4.4 4.4 4.5  4.5  CL 104 101 102 101 103  --   --   CO2 22 25 21* 22 24  --   --   GLUCOSE 139* 153* 189* 204* 157*  --   --   BUN 24* 28* 29* 38* 41*  --   --   CREATININE 0.95 1.07 0.78 0.98 1.07  --   --   CALCIUM 9.3  9.4 9.4 9.5 9.1  --   --   MG  --  2.1  --   --   --   --   --     GFR: Estimated Creatinine Clearance: 75.5 mL/min (by C-G formula based on SCr of 1.07 mg/dL).  Liver Function Tests: Recent Labs  Lab 03/01/21 1343  AST 26  ALT 20  ALKPHOS 48  BILITOT 0.7  PROT 7.4  ALBUMIN 4.0    CBG: Recent Labs  Lab 03/04/21 1155 03/04/21 1542 03/04/21 2057 03/05/21 0625 03/05/21 1142  GLUCAP 227* 317* 277* 170* 169*     Recent Results (from the past 240 hour(s))  Resp Panel by RT-PCR (Flu Jolane Bankhead&B, Covid) Nasopharyngeal Swab     Status: None   Collection Time: 03/01/21  3:05 PM   Specimen: Nasopharyngeal Swab; Nasopharyngeal(NP) swabs in vial transport medium  Result Value Ref Range Status   SARS Coronavirus 2 by RT PCR NEGATIVE NEGATIVE Final    Comment: (NOTE) SARS-CoV-2 target nucleic acids are NOT DETECTED.  The SARS-CoV-2 RNA is generally detectable in upper respiratory specimens during the acute phase of infection. The lowest concentration of SARS-CoV-2 viral copies this assay can detect is 138 copies/mL. Denise Bramblett negative result does not preclude SARS-Cov-2 infection and should not be used as the sole basis for treatment or other patient management decisions. Adriyana Greenbaum negative result may occur with  improper specimen collection/handling, submission of specimen other than nasopharyngeal swab, presence of viral mutation(s) within the areas targeted by this assay, and inadequate number of viral copies(<138 copies/mL). Miara Emminger negative result must be combined with clinical observations, patient history, and epidemiological information. The expected result is Negative.  Fact Sheet for Patients:  BloggerCourse.com  Fact Sheet for Healthcare Providers:  SeriousBroker.it  This test is no t yet approved or cleared by the [place] FDA and  has been authorized for detection and/or diagnosis of SARS-CoV-2 by FDA under an Emergency Use  Authorization (EUA). This EUA will remain  in effect (meaning this test can be used) for the duration of the COVID-19 declaration under Section 564(b)(1) of the Act, 21 U.S.C.section 360bbb-3(b)(1), unless the authorization is terminated  or revoked sooner.       Influenza Kadarrius Yanke by PCR NEGATIVE NEGATIVE Final   Influenza B by PCR NEGATIVE NEGATIVE Final    Comment: (NOTE) The Xpert Xpress SARS-CoV-2/FLU/RSV plus assay is intended as an aid in the diagnosis of influenza from Nasopharyngeal swab specimens and should not be used as Bernestine Holsapple sole basis  for treatment. Nasal washings and aspirates are unacceptable for Xpert Xpress SARS-CoV-2/FLU/RSV testing.  Fact Sheet for Patients: EntrepreneurPulse.com.au  Fact Sheet for Healthcare Providers: IncredibleEmployment.be  This test is not yet approved or cleared by the [place] FDA and has been authorized for detection and/or diagnosis of SARS-CoV-2 by FDA under an Emergency Use Authorization (EUA). This EUA will remain in effect (meaning this test can be used) for the duration of the COVID-19 declaration under Section 564(b)(1) of the Act, 21 U.S.C. section 360bbb-3(b)(1), unless the authorization is terminated or revoked.  Performed at [facility], [address] [place], [place] [address]          Radiology Studies: CARDIAC CATHETERIZATION  Result Date: 03/05/2021  The left ventricular systolic function is normal. The left ventricular ejection fraction is 55-65% by visual estimate.  LV end diastolic pressure is mildly elevated.  There is moderate (3+) mitral regurgitation.  Hemodynamic findings consistent with mild pulmonary hypertension.  Angiographically normal coronary arteries  SUMMARY  Angiographically Normal Coronary Arteries  Mild Pulmonary Hypertension with mean PAP 30 mmHg, PCWP 5 mmHg with LVEDP 15 mmHg.  Mild-moderately reduced Cardiac Output-Cardiac Index (Fick) 4.44-2.12.  RECOMMENDATIONS  Continue with evaluation for mitral valve surgery Glenetta Hew, MD  ECHO Valenzuela  Result Date: 03/05/2021    TRANSESOPHOGEAL ECHO REPORT   Patient Name:   Michael Valenzuela Date of Exam: 03/05/2021 Medical Rec #:  [id number]  Height:       72.0 in Accession #:    [id number] Weight:       190.9 lb Date of Birth:  [birth year]  BSA:          2.089 m Patient Age:    69 years   BP:           113/78 mmHg Patient Gender: M          HR:           76 bpm. Exam Location:  Inpatient Procedure: 2D Echo, 3D Echo, Cardiac Doppler and Color Doppler Indications:     Mitral valve prolapse  History:         Patient has prior history of Echocardiogram examinations, most                  recent 03/02/2021. Arrythmias:Atrial Fibrillation,                  Signs/Symptoms:Shortness of Breath; Risk Factors:Hypertension,                  Dyslipidemia, Diabetes and Current Smoker.  Sonographer:     Clayton Lefort RDCS (AE) Referring Phys:  [id number] [place] Diagnosing Phys: Oswaldo Milian MD PROCEDURE: After discussion of the risks and benefits of Searra Carnathan Valenzuela, an informed consent was obtained from the patient. The transesophogeal probe was passed without difficulty through the esophogus of the patient. Local oropharyngeal anesthetic was provided with Cetacaine. Sedation performed by different physician. The patient was monitored while under deep sedation. Anesthestetic sedation was provided intravenously by Anesthesiology: 170.6mg  of Propofol. Image quality was good. The patient developed no complications during the procedure. IMPRESSIONS  1. Left ventricular ejection fraction, by estimation, is 60 to 65%. The left ventricle has normal function.  2. Right ventricular systolic function is normal. The right ventricular size is mildly enlarged.  3. Left atrial size was severely dilated. No left atrial/left atrial appendage thrombus was detected.  4. Right atrial size was mildly dilated.  5. The aortic valve is tricuspid. Aortic  valve regurgitation is not visualized.  6. The mitral valve is abnormal. Flail P2. Severe mitral valve regurgitation. MR severe by both 2D ERO and 3D VCA. Systolic flow reversal in pulmonary veins, consistent with severe MR. FINDINGS  Left Ventricle: Left ventricular ejection fraction, by estimation, is 60 to 65%. The left ventricle has normal function. The left ventricular internal cavity size was normal in size. Right Ventricle: The right ventricular size is mildly enlarged. No increase in right ventricular wall thickness. Right ventricular systolic function is normal. Left Atrium: Left atrial size was severely dilated. No left atrial/left atrial appendage thrombus was detected. Right Atrium: Right atrial size was mildly dilated. Pericardium: There is no evidence of pericardial effusion. Mitral Valve: The mitral valve is abnormal. Severe mitral valve regurgitation. Tricuspid Valve: The tricuspid valve is normal in structure. Tricuspid valve regurgitation is mild. Aortic Valve: The aortic valve is tricuspid. Aortic valve regurgitation is not visualized. Pulmonic Valve: The pulmonic valve was grossly normal. Pulmonic valve regurgitation is not visualized. Aorta: The aortic root is normal in size and structure. IAS/Shunts: No atrial level shunt detected by color flow Doppler.  MR Peak grad:    61.2 mmHg MR Mean grad:    39.0 mmHg MR Vmax:         391.00 cm/s MR Vmean:        292.0 cm/s MR PISA:         18.16 cm MR PISA Eff ROA: 158 mm MR PISA Radius:  1.70 cm Oswaldo Milian MD Electronically signed by Oswaldo Milian MD Signature Date/Time: 03/05/2021/1:56:06 PM    Final         Scheduled Meds: . albuterol  2.5 mg Nebulization BID  . amLODipine  5 mg Oral Daily  . [START ON 03/06/2021] enoxaparin (LOVENOX) injection  80 mg Subcutaneous Q12H  . fenofibrate  160 mg Oral Daily  . furosemide  40 mg Intravenous Daily  . guaiFENesin  600 mg Oral BID  . insulin aspart  0-5 Units Subcutaneous QHS  .  insulin aspart  0-6 Units Subcutaneous TID WC  . insulin detemir  15 Units Subcutaneous QHS  . metoprolol tartrate  25 mg Oral BID  . oxyCODONE  15 mg Oral Q6H  . pantoprazole  40 mg Oral Daily  . [START ON 03/06/2021] potassium chloride  40 mEq Oral Daily  . rosuvastatin  10 mg Oral Daily  . sodium chloride flush  3 mL Intravenous Q12H  . sodium chloride flush  3 mL Intravenous Q12H  . traZODone  100 mg Oral QHS   Continuous Infusions: . sodium chloride    . sodium chloride    . sodium chloride       LOS: 4 days    Time spent: over 30 min    Fayrene Helper, MD [facility]   To contact the attending provider between 7A-7P or the covering provider during after hours 7P-7A, please log into the web site [email or web address] and access using universal [place] password for that web site. If you do not have the password, please call the hospital operator.  03/05/2021, 3:23 PM

## 2021-03-05 NOTE — Progress Notes (Signed)
Progress Note  Patient Name: Michael Valenzuela Date of Encounter: 03/05/2021  [facility] Cardiologist: No primary care provider on file.  He is from [place].  Subjective   Feeling better, no significant shortness of breath.  Inpatient Medications    Scheduled Meds: . albuterol  2.5 mg Nebulization BID  . amLODipine  5 mg Oral Daily  . apixaban  5 mg Oral BID  . fenofibrate  160 mg Oral Daily  . furosemide  40 mg Intravenous Daily  . guaiFENesin  600 mg Oral BID  . insulin aspart  0-5 Units Subcutaneous QHS  . insulin aspart  0-6 Units Subcutaneous TID WC  . insulin detemir  15 Units Subcutaneous QHS  . metoprolol tartrate  25 mg Oral BID  . oxyCODONE  15 mg Oral Q6H  . pantoprazole  40 mg Oral Daily  . potassium chloride  40 mEq Oral BID  . rosuvastatin  10 mg Oral Daily  . sodium chloride flush  3 mL Intravenous Q12H  . traZODone  100 mg Oral QHS   Continuous Infusions: . sodium chloride     PRN Meds: sodium chloride, acetaminophen, albuterol, cyclobenzaprine, ondansetron (ZOFRAN) IV, sodium chloride flush   Vital Signs    Vitals:   03/05/21 0855 03/05/21 0900 03/05/21 0904 03/05/21 0930  BP:  105/79 110/77 113/77  Pulse: 76 87 81 69  Resp: 20 18 17 18   Temp:      TempSrc:      SpO2: 93% 93% 94% 95%  Weight:      Height:        Intake/Output Summary (Last 24 hours) at 03/05/2021 1010 Last data filed at 03/05/2021 0841 Gross per 24 hour  Intake 1240 ml  Output 1150 ml  Net 90 ml   Last 3 Weights 03/05/2021 03/04/2021 03/04/2021  Weight (lbs) 190 lb 14.4 oz 191 lb 1.6 oz 192 lb 0.3 oz  Weight (kg) 86.592 kg 86.682 kg 87.1 kg      Telemetry    Atrial fibrillation, occasional slow ventricular response- Personally Reviewed  ECG    Atrial fibrillation- Personally Reviewed  Physical Exam   GEN: No acute distress.   Neck: No JVD Cardiac:  Irregularly irregular normal rate, 3/6 holosystolic murmur, no rubs, or gallops.  Respiratory: Clear to  auscultation bilaterally. GI: Soft, nontender, non-distended  MS: No edema; No deformity. Neuro:  Nonfocal  Psych: Normal affect   Labs    High Sensitivity Troponin:   Recent Labs  Lab 03/01/21 1343 03/01/21 1602  TROPONINIHS 11 11      Chemistry Recent Labs  Lab 03/01/21 1343 03/02/21 0512 03/03/21 0503 03/04/21 0358 03/05/21 0409  NA 136   < > 134* 135 133*  K 3.9   < > 4.1 4.4 4.4  CL 104   < > 102 101 103  CO2 22   < > 21* 22 24  GLUCOSE 139*   < > 189* 204* 157*  BUN 24*   < > 29* 38* 41*  CREATININE 0.95   < > 0.78 0.98 1.07  CALCIUM 9.3   < > 9.4 9.5 9.1  PROT 7.4  --   --   --   --   ALBUMIN 4.0  --   --   --   --   AST 26  --   --   --   --   ALT 20  --   --   --   --   ALKPHOS 48  --   --   --   --  BILITOT 0.7  --   --   --   --   GFRNONAA >60   < > >60 >60 >60  ANIONGAP 10   < > 11 12 6    < > = values in this interval not displayed.     Hematology Recent Labs  Lab 03/01/21 1343 03/05/21 0409  WBC 10.0 15.4*  RBC 4.58 4.31  HGB 13.9 12.8*  HCT 41.2 38.2*  MCV 90.0 88.6  MCH 30.3 29.7  MCHC 33.7 33.5  RDW 12.5 12.6  PLT 375 340    BNP Recent Labs  Lab 03/01/21 1343  BNP 301.0*     DDimer  Recent Labs  Lab 03/01/21 1343  DDIMER 1.19*    Patient Profile     65 y.o. male with severe mitral valve regurgitation on TEE awaiting right and left heart catheterization  Assessment & Plan    Severe mitral regurgitation - After heart catheterization we will initiate cardiothoracic surgery consultation.  I will go ahead and get this called in.  -Responded well to IV Lasix.  Shortness of breath has improved.  Awaiting right and left heart catheterization.  Diabetes mellitus with hypertension - Currently stable.  Per primary team.  Persistent atrial fibrillation - Newly diagnosed.  Off of anticoagulation for heart catheterization.  Unable to perform cardioversion at this point. - Consider maze if mitral valve surgery is  performed. -Heart rate well controlled.   For questions or updates, please contact [place] Please consult [email or web address] for contact info under        Signed, Candee Furbish, MD  03/05/2021, 10:11 AM

## 2021-03-05 NOTE — Progress Notes (Signed)
   RN called to report worsening SOB after returning from [facility] Patient states breathing was okay this morning, however has become progressively short of breath throughout the day. He was noted to be mildly hypoxic during TEE this morning which showed severe MR with flail P2. R/LHC today showed normal coronaries with mild-moderately reduced cardiac output.   Evaluated patient at bedside. RN just administered IV lasix 40mg  and gave a breathing treatment. Patient reports some improvement in symptoms already. Lungs with scattered wheezing and crackles at lung bases. He has had 400cc UOP already since IV lasix administered. Extremities are warm and well perfused.   Favor ongoing monitoring with low threshold for repeat IV lasix this evening is SOB not improved. Will hold post-cath IVF's given crackles on exam. TCTS consult is pending to evaluate patient for possible MVR.   RN updated to plan. Will continue to monitor closely.  Abigail Butts, PA-C 03/05/21; 4:10 PM

## 2021-03-05 NOTE — Progress Notes (Signed)
Patient came back from cath lab alert and oriented, but having shortness of breath 40 mg of lasix and albuterol given card PA notified PA came bedside to assess the patient. Will continue to monitor

## 2021-03-06 ENCOUNTER — Encounter (HOSPITAL_COMMUNITY): Payer: Self-pay | Admitting: Cardiology

## 2021-03-06 ENCOUNTER — Inpatient Hospital Stay (HOSPITAL_COMMUNITY): Payer: Medicare Other

## 2021-03-06 DIAGNOSIS — R0600 Dyspnea, unspecified: Secondary | ICD-10-CM

## 2021-03-06 DIAGNOSIS — I5033 Acute on chronic diastolic (congestive) heart failure: Secondary | ICD-10-CM | POA: Diagnosis not present

## 2021-03-06 DIAGNOSIS — I340 Nonrheumatic mitral (valve) insufficiency: Secondary | ICD-10-CM

## 2021-03-06 DIAGNOSIS — Z01810 Encounter for preprocedural cardiovascular examination: Secondary | ICD-10-CM

## 2021-03-06 DIAGNOSIS — I4891 Unspecified atrial fibrillation: Secondary | ICD-10-CM | POA: Diagnosis not present

## 2021-03-06 DIAGNOSIS — R0689 Other abnormalities of breathing: Secondary | ICD-10-CM

## 2021-03-06 DIAGNOSIS — I4819 Other persistent atrial fibrillation: Secondary | ICD-10-CM

## 2021-03-06 DIAGNOSIS — I10 Essential (primary) hypertension: Secondary | ICD-10-CM | POA: Diagnosis not present

## 2021-03-06 DIAGNOSIS — J90 Pleural effusion, not elsewhere classified: Secondary | ICD-10-CM

## 2021-03-06 LAB — GLUCOSE, CAPILLARY
Glucose-Capillary: 156 mg/dL — ABNORMAL HIGH (ref 70–99)
Glucose-Capillary: 205 mg/dL — ABNORMAL HIGH (ref 70–99)
Glucose-Capillary: 182 mg/dL — ABNORMAL HIGH (ref 70–99)
Glucose-Capillary: 167 mg/dL — ABNORMAL HIGH (ref 70–99)

## 2021-03-06 LAB — BASIC METABOLIC PANEL
Anion gap: 7 (ref 5–15)
BUN: 30 mg/dL — ABNORMAL HIGH (ref 8–23)
CO2: 25 mmol/L (ref 22–32)
Calcium: 8.9 mg/dL (ref 8.9–10.3)
Chloride: 104 mmol/L (ref 98–111)
Creatinine, Ser: 1.02 mg/dL (ref 0.61–1.24)
GFR, Estimated: >60 mL/min (ref >60)
Glucose, Bld: 150 mg/dL — ABNORMAL HIGH (ref 70–99)
Potassium: 4.5 mmol/L (ref 3.5–5.1)
Sodium: 136 mmol/L (ref 135–145)

## 2021-03-06 LAB — CBC
HCT: 42.9 % (ref 39.0–52.0)
Hemoglobin: 14.1 g/dL (ref 13.0–17.0)
MCH: 29.6 pg (ref 26.0–34.0)
MCHC: 32.9 g/dL (ref 30.0–36.0)
MCV: 90.1 fL (ref 80.0–100.0)
Platelets: 351 10*3/uL (ref 150–400)
RBC: 4.76 MIL/uL (ref 4.22–5.81)
RDW: 12.4 % (ref 11.5–15.5)
WBC: 9.8 10*3/uL (ref 4.0–10.5)
nRBC: 0.3 % — ABNORMAL HIGH (ref 0.0–0.2)

## 2021-03-06 LAB — HEMOGLOBIN A1C
Hgb A1c MFr Bld: 6.7 % — ABNORMAL HIGH (ref 4.8–5.6)
Mean Plasma Glucose: 145.59 mg/dL

## 2021-03-06 LAB — MAGNESIUM: Magnesium: 2.3 mg/dL (ref 1.7–2.4)

## 2021-03-06 LAB — PHOSPHORUS: Phosphorus: 3.3 mg/dL (ref 2.5–4.6)

## 2021-03-06 NOTE — Progress Notes (Signed)
Pre-MVR study completed.   Please see CV Proc for preliminary results.   Darlin Coco, RDMS, RVT

## 2021-03-06 NOTE — Consult Note (Signed)
[address]       [place] [address]             [phone]        Michael Valenzuela Calhan Medical Record [id number] Date of Birth: [birth year]  Referring: No ref. provider found Primary Care: Michael Pretty, FNP Primary Cardiologist:No primary care provider on file.  Chief Complaint:    Chief Complaint  Patient presents with  . Shortness of Breath    History of Present Illness:     65 year old Valenzuela with a history of fibromyalgia but otherwise in good health presented last week with relatively acute shortness of breath.  He presented to his primary care with the symptoms where he was diagnosed with new onset A. fib and treated with Lasix after discovery of hypoxia.  Objective evaluation demonstrated new systolic murmur and an echocardiogram demonstrating eccentric mitral regurgitation.  Transferred to [facility] and underwent left heart catheterization demonstrating clean coronaries.  Consultation is obtained for consideration of mitral valve surgery and atrial ablation.  Patient states that last night he experienced worse shortness of breath and required more diuretic.  This morning he is comfortable.   Current Activity/ Functional Status: Patient will be independent with mobility/ambulation, transfers, ADL's, IADL's.   Zubrod Score: At the time of surgery this patient's most appropriate activity status/level should be described as: []     0    Normal activity, no symptoms []     1    Restricted in physical strenuous activity but ambulatory, able to do out light work []     2    Ambulatory and capable of self care, unable to do work activities, up and about                 more than 50%  Of the time                            []     3    Only limited self care, in bed greater than 50% of waking hours []     4    Completely disabled, no self care, confined to bed or chair []     5    Moribund  Past Medical History:  Diagnosis Date  . Chronic low back pain   .  Diabetes mellitus without complication ([place])   . Fibromyalgia   . Hyperlipidemia   . Hypertension   . Vitamin D deficiency     Past Surgical History:  Procedure Laterality Date  . HERNIA REPAIR    . RIGHT/LEFT HEART CATH AND CORONARY ANGIOGRAPHY N/A 03/05/2021   Procedure: RIGHT/LEFT HEART CATH AND CORONARY ANGIOGRAPHY;  Surgeon: Michael Man, MD;  Location: [facility];  Service: Cardiovascular;  Laterality: N/A;    Social History   Tobacco Use  Smoking Status Former Smoker  . Packs/day: 0.50  . Years: 20.00  . Pack years: 10.00  . Types: Cigarettes  . Quit date: 02/09/2019  . Years since quitting: 2.0  Smokeless Tobacco Never Used    Social History   Substance and Sexual Activity  Alcohol Use Yes   Comment: occasional     No Known Allergies  Current Facility-Administered Medications  Medication Dose Route Frequency Provider Last Rate Last Admin  . 0.9 %  sodium chloride infusion  250 mL Intravenous PRN Michael Man, MD      . 0.9 %  sodium chloride infusion  250 mL Intravenous PRN Michael Man, MD      . acetaminophen (TYLENOL) tablet 650 mg  650 mg Oral Q4H PRN Michael Man, MD   650 mg at 03/02/21 1316  . albuterol (PROVENTIL) (2.5 MG/3ML) 0.083% nebulizer solution 2.5 mg  2.5 mg Nebulization BID Michael Man, MD   2.5 mg at 03/06/21 [id number]  . albuterol (PROVENTIL) (2.5 MG/3ML) 0.083% nebulizer solution 2.5 mg  2.5 mg Nebulization Q2H PRN Michael Man, MD      . amLODipine (NORVASC) tablet 5 mg  5 mg Oral Daily Michael Man, MD   5 mg at 03/05/21 1037  . cyclobenzaprine (FLEXERIL) tablet 10 mg  10 mg Oral TID PRN Michael Man, MD      . enoxaparin (LOVENOX) injection 80 mg  80 mg Subcutaneous Q12H Michael Man, MD   80 mg at 03/06/21 0412  . fenofibrate tablet 160 mg  160 mg Oral Daily Michael Man, MD   160 mg at 03/05/21 1039  . furosemide (LASIX) injection 40 mg  40 mg Intravenous Daily Michael Man, MD   40 mg at  03/05/21 1441  . guaiFENesin (MUCINEX) 12 hr tablet 600 mg  600 mg Oral BID Michael Man, MD   600 mg at 03/05/21 2110  . insulin aspart (novoLOG) injection 0-5 Units  0-5 Units Subcutaneous QHS Michael Man, MD   3 Units at 03/05/21 2230  . insulin aspart (novoLOG) injection 0-6 Units  0-6 Units Subcutaneous TID WC Michael Man, MD   1 Units at 03/06/21 [phone]  . insulin detemir (LEVEMIR) injection 5 Units  5 Units Subcutaneous BID Michael Florence., MD   5 Units at 03/05/21 2230  . metoprolol tartrate (LOPRESSOR) tablet 25 mg  25 mg Oral BID Michael Man, MD   25 mg at 03/05/21 2110  . ondansetron (ZOFRAN) injection 4 mg  4 mg Intravenous Q6H PRN Michael Man, MD      . oxyCODONE (Oxy IR/ROXICODONE) immediate release tablet 15 mg  15 mg Oral Q6H Michael Man, MD   15 mg at 03/06/21 0411  . pantoprazole (PROTONIX) EC tablet 40 mg  40 mg Oral Daily Michael Man, MD   40 mg at 03/05/21 1039  . potassium chloride SA (KLOR-CON) CR tablet 40 mEq  40 mEq Oral Daily Michael Man, MD      . rosuvastatin (CRESTOR) tablet 10 mg  10 mg Oral Daily Michael Man, MD   10 mg at 03/05/21 1038  . sodium chloride flush (NS) 0.9 % injection 3 mL  3 mL Intravenous Q12H Michael Man, MD   3 mL at 03/05/21 2113  . sodium chloride flush (NS) 0.9 % injection 3 mL  3 mL Intravenous PRN Michael Man, MD   3 mL at 03/05/21 1040  . sodium chloride flush (NS) 0.9 % injection 3 mL  3 mL Intravenous Q12H Michael Man, MD   3 mL at 03/05/21 1820  . sodium chloride flush (NS) 0.9 % injection 3 mL  3 mL Intravenous PRN Michael Man, MD      . traZODone (DESYREL) tablet 100 mg  100 mg Oral QHS Michael Man, MD   100 mg at 03/05/21 2110    Medications Prior to Admission  Medication Sig Dispense Refill Last Dose  . amLODipine (NORVASC) 10 MG tablet TAKE 1 TABLET(10 MG) BY MOUTH DAILY (Patient taking differently:  Take 10 mg by mouth daily. TAKE 1 TABLET(10 MG) BY MOUTH  DAILY) 90 tablet 1 03/01/2021 at Unknown time  . aspirin EC 81 MG tablet Take 1 tablet (81 mg total) by mouth daily.   03/01/2021 at Unknown time  . felodipine (PLENDIL) 10 MG 24 hr tablet TAKE 1 TABLET(10 MG) BY MOUTH DAILY (Patient taking differently: Take 10 mg by mouth daily. TAKE 1 TABLET(10 MG) BY MOUTH DAILY) 90 tablet 1 03/01/2021 at Unknown time  . fenofibrate 160 MG tablet TAKE 1 TABLET(160 MG) BY MOUTH DAILY (Patient taking differently: Take 160 mg by mouth daily. TAKE 1 TABLET(160 MG) BY MOUTH DAILY) 90 tablet 1 03/01/2021 at Unknown time  . hydrochlorothiazide (HYDRODIURIL) 25 MG tablet TAKE 1 TABLET(25 MG) BY MOUTH DAILY (Patient taking differently: Take 25 mg by mouth daily. TAKE 1 TABLET(25 MG) BY MOUTH DAILY) 90 tablet 1 03/01/2021 at Unknown time  . losartan (COZAAR) 100 MG tablet TAKE 1 TABLET(100 MG) BY MOUTH DAILY 90 tablet 1 03/01/2021 at Unknown time  . metFORMIN (GLUCOPHAGE) 1000 MG tablet TAKE 1 TABLET(1000 MG) BY MOUTH TWICE DAILY WITH A MEAL (Patient taking differently: Take 1,000 mg by mouth 2 (two) times daily with a meal. TAKE 1 TABLET(1000 MG) BY MOUTH TWICE DAILY WITH A MEAL) 180 tablet 1 03/01/2021 at Unknown time  . oxyCODONE (ROXICODONE) 15 MG immediate release tablet Take 15 mg by mouth every 6 (six) hours as needed for pain.   03/01/2021 at Unknown time  . rosuvastatin (CRESTOR) 10 MG tablet Take 1 tablet (10 mg total) by mouth daily. 90 tablet 1 03/01/2021 at Unknown time  . sitaGLIPtin (JANUVIA) 100 MG tablet TAKE 1 TABLET(100 MG) BY MOUTH DAILY (Patient taking differently: Take 100 mg by mouth daily. TAKE 1 TABLET(100 MG) BY MOUTH DAILY) 90 tablet 1 03/01/2021 at Unknown time    Family History  Problem Relation Age of Onset  . Heart disease Mother   . Diabetes Mother   . Cancer Mother   . Heart disease Father   . Diabetes Son 15       type 1  . Diabetes Paternal Uncle   . Heart attack Brother   . Hypertension Son      Review of Systems:   ROS Pertinent items  noted in HPI and remainder of comprehensive ROS otherwise negative.     Cardiac Review of Systems: Y or  [    ]= no  Chest Pain [    ]  Resting SOB [   ] Exertional SOB  [  ]  Orthopnea [  ]   Pedal Edema [   ]    Palpitations [  ] Syncope  [  ]   Presyncope [   ]  General Review of Systems: [Y] = yes [  ]=no Constitional: recent weight change [  ]; anorexia [  ]; fatigue [  ]; nausea [  ]; night sweats [  ]; fever [  ]; or chills [  ]                                                               Dental: Last Dentist visit:   Eye : blurred vision [  ]; diplopia [   ]; vision changes [  ];  Amaurosis fugax[  ]; Resp: cough [  ];  wheezing[  ];  hemoptysis[  ]; shortness of breath[  ]; paroxysmal nocturnal dyspnea[  ]; dyspnea on exertion[  ]; or orthopnea[  ];  GI:  gallstones[  ], vomiting[  ];  dysphagia[  ]; melena[  ];  hematochezia [  ]; heartburn[  ];   Hx of  Colonoscopy[  ]; GU: kidney stones [  ]; hematuria[  ];   dysuria [  ];  nocturia[  ];  history of     obstruction [  ]; urinary frequency [  ]             Skin: rash, swelling[  ];, hair loss[  ];  peripheral edema[  ];  or itching[  ]; Musculosketetal: myalgias[  ];  joint swelling[  ];  joint erythema[  ];  joint pain[  ];  back pain[  ];  Heme/Lymph: bruising[  ];  bleeding[  ];  anemia[  ];  Neuro: TIA[  ];  headaches[  ];  stroke[  ];  vertigo[  ];  seizures[  ];   paresthesias[  ];  difficulty walking[  ];  Psych:depression[  ]; anxiety[  ];  Endocrine: diabetes[  ];  thyroid dysfunction[  ];             Physical Exam: BP 130/87 (BP Location: Left Arm)   Pulse 92   Temp 98.3 F (36.8 C) (Oral)   Resp 17   Ht 6' (1.829 m)   Wt 86 kg   SpO2 98%   BMI 25.71 kg/m    General appearance: alert and cooperative Neck: no adenopathy, no carotid bruit, no JVD, supple, symmetrical, trachea midline and thyroid not enlarged, symmetric, no tenderness/mass/nodules Resp: diminished breath sounds bibasilar Cardio: irregularly  irregular rhythm GI: soft, non-tender; bowel sounds normal; no masses,  no organomegaly Extremities: extremities normal, atraumatic, no cyanosis or edema Neurologic: Alert and oriented X 3, normal strength and tone. Normal symmetric reflexes. Normal coordination and gait   3/6 systolic ejection murmur at the apex and radiating to the back   Diagnostic Studies & Laboratory data:     Recent Radiology Findings:   DG Orthopantogram  Result Date: 03/05/2021 CLINICAL DATA:  Severe mitral regurgitation.  No oral pain. EXAM: ORTHOPANTOGRAM/PANORAMIC COMPARISON:  None. FINDINGS: Previous extractions of the upper and lower third molars bilaterally, partial resection of the right upper second molar, and resection of the left lower second bicuspid. Dental caries demonstrated in the left upper second molar, the left lower first and second molars, and the right lower second molar. Reconstructions demonstrated in the right upper cuspid and first bicuspid, the left upper cuspid and first bicuspid, the right lower second bicuspid and first and second molars, and changes consistent with root canal in the left lower first molar. Minimal bone resorption suggested around the right lower second molar. No definite periapical lucencies. No acute fracture or dislocation of the visualized mandible. IMPRESSION: Multiple dental caries and prior tooth extractions. No definite periapical lucencies. No acute fracture or dislocation of the mandible. Electronically Signed   By: Lucienne Capers M.D.   On: 03/05/2021 21:40   CARDIAC CATHETERIZATION  Result Date: 03/05/2021  The left ventricular systolic function is normal. The left ventricular ejection fraction is 55-65% by visual estimate.  LV end diastolic pressure is mildly elevated.  There is moderate (3+) mitral regurgitation.  Hemodynamic findings consistent with mild pulmonary hypertension.  Angiographically normal coronary arteries  SUMMARY  Angiographically Normal  Coronary Arteries  Mild Pulmonary Hypertension with mean PAP 30 mmHg, PCWP 5 mmHg with LVEDP 15 mmHg.  Mild-moderately reduced Cardiac Output-Cardiac Index (Fick) 4.44-2.12. RECOMMENDATIONS  Continue with evaluation for mitral valve surgery Glenetta Hew, MD  ECHO TEE  Result Date: 03/05/2021    TRANSESOPHOGEAL ECHO REPORT   Patient Name:   Michael Valenzuela Date of Exam: 03/05/2021 Medical Rec #:  [id number]  Height:       72.0 in Accession #:    [id number] Weight:       190.9 lb Date of Birth:  06/24/1956  BSA:          2.089 m Patient Age:    73 years   BP:           113/78 mmHg Patient Gender: M          HR:           76 bpm. Exam Location:  Inpatient Procedure: 2D Echo, 3D Echo, Cardiac Doppler and Color Doppler Indications:     Mitral valve prolapse  History:         Patient has prior history of Echocardiogram examinations, most                  recent 03/02/2021. Arrythmias:Atrial Fibrillation,                  Signs/Symptoms:Shortness of Breath; Risk Factors:Hypertension,                  Dyslipidemia, Diabetes and Current Smoker.  Sonographer:     Clayton Lefort RDCS (AE) Referring Phys:  [id number] Alicia Diagnosing Phys: Oswaldo Milian MD PROCEDURE: After discussion of the risks and benefits of a TEE, an informed consent was obtained from the patient. The transesophogeal probe was passed without difficulty through the esophogus of the patient. Local oropharyngeal anesthetic was provided with Cetacaine. Sedation performed by different physician. The patient was monitored while under deep sedation. Anesthestetic sedation was provided intravenously by Anesthesiology: 170.6mg  of Propofol. Image quality was good. The patient developed no complications during the procedure. IMPRESSIONS  1. Left ventricular ejection fraction, by estimation, is 60 to 65%. The left ventricle has normal function.  2. Right ventricular systolic function is normal. The right ventricular size is mildly enlarged.  3. Left  atrial size was severely dilated. No left atrial/left atrial appendage thrombus was detected.  4. Right atrial size was mildly dilated.  5. The aortic valve is tricuspid. Aortic valve regurgitation is not visualized.  6. The mitral valve is abnormal. Flail P2. Severe mitral valve regurgitation. MR severe by both 2D ERO and 3D VCA. Systolic flow reversal in pulmonary veins, consistent with severe MR. FINDINGS  Left Ventricle: Left ventricular ejection fraction, by estimation, is 60 to 65%. The left ventricle has normal function. The left ventricular internal cavity size was normal in size. Right Ventricle: The right ventricular size is mildly enlarged. No increase in right ventricular wall thickness. Right ventricular systolic function is normal. Left Atrium: Left atrial size was severely dilated. No left atrial/left atrial appendage thrombus was detected. Right Atrium: Right atrial size was mildly dilated. Pericardium: There is no evidence of pericardial effusion. Mitral Valve: The mitral valve is abnormal. Severe mitral valve regurgitation. Tricuspid Valve: The tricuspid valve is normal in structure. Tricuspid valve regurgitation is mild. Aortic Valve: The aortic valve is tricuspid. Aortic valve regurgitation is not visualized. Pulmonic Valve: The pulmonic valve was grossly normal. Pulmonic valve regurgitation is  not visualized. Aorta: The aortic root is normal in size and structure. IAS/Shunts: No atrial level shunt detected by color flow Doppler.  MR Peak grad:    61.2 mmHg MR Mean grad:    39.0 mmHg MR Vmax:         391.00 cm/s MR Vmean:        292.0 cm/s MR PISA:         18.16 cm MR PISA Eff ROA: 158 mm MR PISA Radius:  1.70 cm Oswaldo Milian MD Electronically signed by Oswaldo Milian MD Signature Date/Time: 03/05/2021/1:56:06 PM    Final      I have independently reviewed the above radiologic studies and discussed with the patient   Recent Lab Findings: Lab Results  Component Value Date    WBC 9.8 03/06/2021   HGB 14.1 03/06/2021   HCT 42.9 03/06/2021   PLT 351 03/06/2021   GLUCOSE 150 (H) 03/06/2021   CHOL 153 12/28/2020   TRIG 259 (H) 12/28/2020   HDL 41 12/28/2020   LDLCALC 70 12/28/2020   ALT 20 03/01/2021   AST 26 03/01/2021   NA 136 03/06/2021   K 4.5 03/06/2021   CL 104 03/06/2021   CREATININE 1.02 03/06/2021   BUN 30 (H) 03/06/2021   CO2 25 03/06/2021   TSH 4.498 03/02/2021   HGBA1C 6.7 (H) 03/05/2021      Assessment / Plan:      65 year old Valenzuela with new onset heart failure symptoms.  This is attributable to mitral regurgitation.  He appears to be a good candidate for mitral valve replacement/repair and biatrial ablation.  We will tentatively plan for this on Friday pending final assessment of his dental films.    I  spent 30 minutes counseling the patient face to face.   Raedyn Wenke Z. Orvan Seen, MD [phone] 03/06/2021 8:54 AM

## 2021-03-06 NOTE — Progress Notes (Signed)
CARDIAC REHAB PHASE I   PRE:  Rate/Rhythm: 74 afib  BP:  Supine:   Sitting: 106/83  Standing:    SaO2: 94% 2L  MODE:  Ambulation: 200 ft   POST:  Rate/Rhythm: 82 afib  BP:  Supine:   Sitting: 120/89  Standing:    SaO2: 95%RA 1100-1140 Discussed with pt the importance of IS and walking after surgery. Gave IS and pt demonstrated 2100 ml correctly. Discussed sternal precautions and staying in the tube. Gave OHS booklet, care guide and wrote down how to view pre op video. Pt stated his wife will be available to assist with care after discharge. Pt walked 200 ft with steady gait on RA. Tolerated well. Back to oxygen at 2L in room.   Graylon Good, RN BSN  03/06/2021 12:04 PM

## 2021-03-06 NOTE — Progress Notes (Signed)
Progress Note  Patient Name: Michael Valenzuela Date of Encounter: 03/06/2021  [facility] Cardiologist: No primary care provider on file.  Lives in [place], might be best to see clinic in [place] or [place].  Subjective   Had increase shortness of breath yesterday evening.  Received IV Lasix 40 mg.  Improved.  Yesterday catheterization did not show any significant coronary disease.  Feels much better today.  Inpatient Medications    Scheduled Meds: . albuterol  2.5 mg Nebulization BID  . amLODipine  5 mg Oral Daily  . enoxaparin (LOVENOX) injection  80 mg Subcutaneous Q12H  . fenofibrate  160 mg Oral Daily  . furosemide  40 mg Intravenous Daily  . guaiFENesin  600 mg Oral BID  . insulin aspart  0-5 Units Subcutaneous QHS  . insulin aspart  0-6 Units Subcutaneous TID WC  . insulin detemir  5 Units Subcutaneous BID  . metoprolol tartrate  25 mg Oral BID  . oxyCODONE  15 mg Oral Q6H  . pantoprazole  40 mg Oral Daily  . potassium chloride  40 mEq Oral Daily  . rosuvastatin  10 mg Oral Daily  . sodium chloride flush  3 mL Intravenous Q12H  . sodium chloride flush  3 mL Intravenous Q12H  . traZODone  100 mg Oral QHS   Continuous Infusions: . sodium chloride    . sodium chloride     PRN Meds: sodium chloride, sodium chloride, acetaminophen, albuterol, cyclobenzaprine, ondansetron (ZOFRAN) IV, sodium chloride flush, sodium chloride flush   Vital Signs    Vitals:   03/06/21 0413 03/06/21 0415 03/06/21 0805 03/06/21 0833  BP: 99/76  130/87   Pulse: 88  92   Resp: 17  17   Temp: 97.8 F (36.6 C)  98.3 F (36.8 C)   TempSrc: Oral  Oral   SpO2: 94%  96% 98%  Weight:  86 kg    Height:        Intake/Output Summary (Last 24 hours) at 03/06/2021 1116 Last data filed at 03/06/2021 1020 Gross per 24 hour  Intake 246 ml  Output 3450 ml  Net -3204 ml   Last 3 Weights 03/06/2021 03/05/2021 03/04/2021  Weight (lbs) 189 lb 9.6 oz 190 lb 14.4 oz 191 lb 1.6 oz  Weight (kg)  86.002 kg 86.592 kg 86.682 kg      Telemetry    Atrial fibrillation rate controlled- Personally Reviewed   Physical Exam   GEN: No acute distress.   Neck: No JVD Cardiac: IRRR, 3/6 HSM, no rubs, or gallops.  Respiratory: Clear to auscultation bilaterally. GI: Soft, nontender, non-distended  MS: No edema; No deformity. Neuro:  Nonfocal  Psych: Normal affect   Labs    High Sensitivity Troponin:   Recent Labs  Lab 03/01/21 1343 03/01/21 1602  TROPONINIHS 11 11      Chemistry Recent Labs  Lab 03/01/21 1343 03/02/21 0512 03/04/21 0358 03/05/21 0409 03/05/21 1327 03/05/21 1334 03/06/21 0644  NA 136   < > 135 133* 137 137  137 136  K 3.9   < > 4.4 4.4 4.4 4.5  4.5 4.5  CL 104   < > 101 103  --   --  104  CO2 22   < > 22 24  --   --  25  GLUCOSE 139*   < > 204* 157*  --   --  150*  BUN 24*   < > 38* 41*  --   --  30*  CREATININE 0.95   < > 0.98 1.07  --   --  1.02  CALCIUM 9.3   < > 9.5 9.1  --   --  8.9  PROT 7.4  --   --   --   --   --   --   ALBUMIN 4.0  --   --   --   --   --   --   AST 26  --   --   --   --   --   --   ALT 20  --   --   --   --   --   --   ALKPHOS 48  --   --   --   --   --   --   BILITOT 0.7  --   --   --   --   --   --   GFRNONAA >60   < > >60 >60  --   --  >60  ANIONGAP 10   < > 12 6  --   --  7   < > = values in this interval not displayed.     Hematology Recent Labs  Lab 03/01/21 1343 03/05/21 0409 03/05/21 1327 03/05/21 1334 03/06/21 0644  WBC 10.0 15.4*  --   --  9.8  RBC 4.58 4.31  --   --  4.76  HGB 13.9 12.8* 12.6* 12.9*  13.3 14.1  HCT 41.2 38.2* 37.0* 38.0*  39.0 42.9  MCV 90.0 88.6  --   --  90.1  MCH 30.3 29.7  --   --  29.6  MCHC 33.7 33.5  --   --  32.9  RDW 12.5 12.6  --   --  12.4  PLT 375 340  --   --  351    BNP Recent Labs  Lab 03/01/21 1343  BNP 301.0*     DDimer  Recent Labs  Lab 03/01/21 1343  DDIMER 1.19*     Radiology    DG Orthopantogram  Result Date: 03/05/2021 CLINICAL DATA:   Severe mitral regurgitation.  No oral pain. EXAM: ORTHOPANTOGRAM/PANORAMIC COMPARISON:  None. FINDINGS: Previous extractions of the upper and lower third molars bilaterally, partial resection of the right upper second molar, and resection of the left lower second bicuspid. Dental caries demonstrated in the left upper second molar, the left lower first and second molars, and the right lower second molar. Reconstructions demonstrated in the right upper cuspid and first bicuspid, the left upper cuspid and first bicuspid, the right lower second bicuspid and first and second molars, and changes consistent with root canal in the left lower first molar. Minimal bone resorption suggested around the right lower second molar. No definite periapical lucencies. No acute fracture or dislocation of the visualized mandible. IMPRESSION: Multiple dental caries and prior tooth extractions. No definite periapical lucencies. No acute fracture or dislocation of the mandible. Electronically Signed   By: Lucienne Capers M.D.   On: 03/05/2021 21:40   CARDIAC CATHETERIZATION  Result Date: 03/05/2021  The left ventricular systolic function is normal. The left ventricular ejection fraction is 55-65% by visual estimate.  LV end diastolic pressure is mildly elevated.  There is moderate (3+) mitral regurgitation.  Hemodynamic findings consistent with mild pulmonary hypertension.  Angiographically normal coronary arteries  SUMMARY  Angiographically Normal Coronary Arteries  Mild Pulmonary Hypertension with mean PAP 30 mmHg, PCWP 5 mmHg with LVEDP 15 mmHg.  Mild-moderately reduced Cardiac Output-Cardiac Index (  Fick) 4.44-2.12. RECOMMENDATIONS  Continue with evaluation for mitral valve surgery Glenetta Hew, MD  ECHO TEE  Result Date: 03/05/2021    TRANSESOPHOGEAL ECHO REPORT   Patient Name:   Michael Valenzuela Date of Exam: 03/05/2021 Medical Rec #:  [id number]  Height:       72.0 in Accession #:    [id number] Weight:       190.9 lb Date  of Birth:  [birth year]  BSA:          2.089 m Patient Age:    65 years   BP:           113/78 mmHg Patient Gender: M          HR:           76 bpm. Exam Location:  Inpatient Procedure: 2D Echo, 3D Echo, Cardiac Doppler and Color Doppler Indications:     Mitral valve prolapse  History:         Patient has prior history of Echocardiogram examinations, most                  recent 03/02/2021. Arrythmias:Atrial Fibrillation,                  Signs/Symptoms:Shortness of Breath; Risk Factors:Hypertension,                  Dyslipidemia, Diabetes and Current Smoker.  Sonographer:     Clayton Lefort RDCS (AE) Referring Phys:  [id number] [place] Diagnosing Phys: Oswaldo Milian MD PROCEDURE: After discussion of the risks and benefits of a TEE, an informed consent was obtained from the patient. The transesophogeal probe was passed without difficulty through the esophogus of the patient. Local oropharyngeal anesthetic was provided with Cetacaine. Sedation performed by different physician. The patient was monitored while under deep sedation. Anesthestetic sedation was provided intravenously by Anesthesiology: 170.6mg  of Propofol. Image quality was good. The patient developed no complications during the procedure. IMPRESSIONS  1. Left ventricular ejection fraction, by estimation, is 60 to 65%. The left ventricle has normal function.  2. Right ventricular systolic function is normal. The right ventricular size is mildly enlarged.  3. Left atrial size was severely dilated. No left atrial/left atrial appendage thrombus was detected.  4. Right atrial size was mildly dilated.  5. The aortic valve is tricuspid. Aortic valve regurgitation is not visualized.  6. The mitral valve is abnormal. Flail P2. Severe mitral valve regurgitation. MR severe by both 2D ERO and 3D VCA. Systolic flow reversal in pulmonary veins, consistent with severe MR. FINDINGS  Left Ventricle: Left ventricular ejection fraction, by estimation, is 60 to  65%. The left ventricle has normal function. The left ventricular internal cavity size was normal in size. Right Ventricle: The right ventricular size is mildly enlarged. No increase in right ventricular wall thickness. Right ventricular systolic function is normal. Left Atrium: Left atrial size was severely dilated. No left atrial/left atrial appendage thrombus was detected. Right Atrium: Right atrial size was mildly dilated. Pericardium: There is no evidence of pericardial effusion. Mitral Valve: The mitral valve is abnormal. Severe mitral valve regurgitation. Tricuspid Valve: The tricuspid valve is normal in structure. Tricuspid valve regurgitation is mild. Aortic Valve: The aortic valve is tricuspid. Aortic valve regurgitation is not visualized. Pulmonic Valve: The pulmonic valve was grossly normal. Pulmonic valve regurgitation is not visualized. Aorta: The aortic root is normal in size and structure. IAS/Shunts: No atrial level shunt detected by color flow Doppler.  MR Peak grad:  61.2 mmHg MR Mean grad:    39.0 mmHg MR Vmax:         391.00 cm/s MR Vmean:        292.0 cm/s MR PISA:         18.16 cm MR PISA Eff ROA: 158 mm MR PISA Radius:  1.70 cm Oswaldo Milian MD Electronically signed by Oswaldo Milian MD Signature Date/Time: 03/05/2021/1:56:06 PM    Final     Cardiac Studies   TEE demonstrated severe mitral regurgitation   Patient Profile     65 y.o. male with severe mitral regurgitation, concomitant dyspnea  Assessment & Plan    Severe mitral regurgitation - Appreciate T CTS consultation, Dr. Orvan Seen. -Dental films are being reviewed. -Pending mitral valve repair, maze - Continue with IV Lasix 40 mg daily.  Persistent atrial fibrillation - Newly diagnosed, Eliquis on hold for potential surgery.  Appreciate pharmacy team assistance. -Currently on full dose enoxaparin.  Please note this, given this will need to be held prior to surgery.  Diabetes with hypertension -  Stable.  Primary team monitoring.   For questions or updates, please contact [place] Please consult [email or web address] for contact info under        Signed, Candee Furbish, MD  03/06/2021, 11:16 AM

## 2021-03-06 NOTE — Plan of Care (Signed)
  Problem: Clinical Measurements: Goal: Ability to maintain clinical measurements within normal limits will improve 03/06/2021 1021 by Mariane Baumgarten, RN Outcome: Progressing 03/06/2021 0853 by Mariane Baumgarten, RN Outcome: Progressing Goal: Will remain free from infection 03/06/2021 1021 by Mariane Baumgarten, RN Outcome: Progressing 03/06/2021 0853 by Mariane Baumgarten, RN Outcome: Progressing Goal: Diagnostic test results will improve 03/06/2021 1021 by Mariane Baumgarten, RN Outcome: Progressing 03/06/2021 0853 by Mariane Baumgarten, RN Outcome: Progressing Goal: Respiratory complications will improve 03/06/2021 1021 by Mariane Baumgarten, RN Outcome: Progressing 03/06/2021 0853 by Mariane Baumgarten, RN Outcome: Progressing Goal: Cardiovascular complication will be avoided 03/06/2021 1021 by Mariane Baumgarten, RN Outcome: Progressing 03/06/2021 0853 by Mariane Baumgarten, RN Outcome: Progressing   Problem: Activity: Goal: Risk for activity intolerance will decrease 03/06/2021 1021 by Mariane Baumgarten, RN Outcome: Progressing 03/06/2021 0853 by Mariane Baumgarten, RN Outcome: Progressing   Problem: Nutrition: Goal: Adequate nutrition will be maintained 03/06/2021 1021 by Mariane Baumgarten, RN Outcome: Progressing 03/06/2021 0853 by Mariane Baumgarten, RN Outcome: Progressing   Problem: Coping: Goal: Level of anxiety will decrease 03/06/2021 1021 by Mariane Baumgarten, RN Outcome: Progressing 03/06/2021 0853 by Mariane Baumgarten, RN Outcome: Progressing

## 2021-03-06 NOTE — Progress Notes (Signed)
PROGRESS NOTE    Michael Valenzuela  [id number] DOB: [birth year] DOA: 03/01/2021 PCP: Chevis Pretty, FNP   Brief Narrative:  HPI On 03/01/2021 by Dr. Loma Boston Michael Valenzuela is a 65 y.o. male with a history of diabetes, fibromyalgia, hyperlipidemia and hypertension.  Patient presents with 3 to 4 days pleurisy and orthopnea.  His symptoms have been worsening with past couple of days.  He has been having increasing difficulty sleeping due to the pleurisy and presented to urgent care for evaluation.  EKG was done and the patient was found to be in atrial fibrillation.  He was sent to the hospital for further evaluation.  Normal here, he was in sinus rhythm, but then converted into atrial fibrillation.  He has been rate controlled.  Patient has been mildly hypoxic, which improved with oxygen via nasal cannula.  No palliating or provoking factors.  Denies fevers, chills, nausea, vomiting.  Denies chest pain  Interim history Patient was admitted with acute hypoxic respiratory failure as well as heart failure exacerbation.  Initially was admitted to [facility] but then transferred to [facility].  Patient was placed on diuretics as well as nebulizers and steroids for possible bronchitis.  Echocardiogram showed moderate to severe MR.  Cardiology consulted and appreciated.  TEE showed severe mitral valve regurgitation.  Right and left heart cath performed.  TCTS consulted. Assessment & Plan   Acute hypoxic respiratory failure -Possibly multifactorial including CHF exacerbation, new diagnosis of atrial fibrillation, severe mitral valve regurgitation -Patient currently on 2 L of oxygen -CTA showed no PE  Atrial fibrillation, new onset -Eliquis currently held -Continue full dose Lovenox  Mitral regurgitation -TEE showed an EF of 60 to 65%.  LA size severely dilated.  No atrial appendage thrombus detected.  Mitral valve is abnormal.  Flail P2.  Severe mitral valve regurgitation.  Severe  MR. -Cardiothoracic surgery consulted and appreciated and feels patient would be a good candidate for mitral valve replacement or repair with biatrial ablation.  Planning for possible intervention on [id number]  Acute diastolic CHF exacerbation -Echocardiogram showed an EF of 70-75%, no regional motion abnormalities.  LV diastolic function could not be evaluated. -CTA chest showed moderate bilateral pleural effusions, small septal thickening consistent with pulmonary edema.  -Monitor intake and output, daily weights -Continue IV Lasix 20 mg daily  Concern for bronchitis -Noted on CTA: Peribronchial groundglass opacities in the right perihilar lung and right upper lobe. Bronchial wall thickening-bronchitis or congestion. -Patient may need repeat imaging for follow-up of resolution -Completed steroids  Hyperlipidemia -Continue statin, fenofibrate  Diabetes mellitus, type II -Metformin and Januvia held -Continue Levemir, insulin sliding scale, CBG monitoring  Essential hypertension -Continue amlodipine, Lasix  DVT Prophylaxis Lovenox  Code Status: Full  Family Communication: None at bedside  Disposition Plan:  Status is: Inpatient  Remains inpatient appropriate because:IV treatments appropriate due to intensity of illness or inability to take PO and Inpatient level of care appropriate due to severity of illness   Dispo: The patient is from: Home              Anticipated d/c is to: Home              Patient currently is not medically stable to d/c.   Difficult to place patient No  Consultants Cardiology Cardiothoracic surgery  Procedures  Cardiogram TEE  Antibiotics   Anti-infectives (From admission, onward)   None      Subjective:   Michael Valenzuela seen and examined today.  Continues  to have intermittent shortness of breath.  States he is feeling better today as compared to previous days.  Denies current chest pain, abdominal pain, nausea or vomiting, diarrhea or  constipation, dizziness or headache.  Objective:   Vitals:   03/06/21 0415 03/06/21 0805 03/06/21 0833 03/06/21 1238  BP:  130/87  116/89  Pulse:  92  91  Resp:  17  16  Temp:  98.3 F (36.8 C)  98 F (36.7 C)  TempSrc:  Oral  Oral  SpO2:  96% 98% 99%  Weight: 86 kg     Height:        Intake/Output Summary (Last 24 hours) at 03/06/2021 1240 Last data filed at 03/06/2021 1020 Gross per 24 hour  Intake 246 ml  Output 3450 ml  Net -3204 ml   Filed Weights   03/04/21 1506 03/05/21 0325 03/06/21 0415  Weight: 86.7 kg 86.6 kg 86 kg    Exam  General: Well developed, chronically ill-appearing, NAD  HEENT: NCAT, mucous membranes moist.   Cardiovascular: S1 S2 auscultated, irregular,3/6 SEM  Respiratory: Clear to auscultation bilaterally with equal chest rise  Abdomen: Soft, nontender, nondistended, + bowel sounds  Extremities: warm dry without cyanosis clubbing or edema  Neuro: AAOx3, nonfocal  Psych: pleasant, appropriate mood and affect   Data Reviewed: I have personally reviewed following labs and imaging studies  CBC: Recent Labs  Lab 03/01/21 1343 03/05/21 0409 03/05/21 1327 03/05/21 1334 03/06/21 0644  WBC 10.0 15.4*  --   --  9.8  HGB 13.9 12.8* 12.6* 12.9*  13.3 14.1  HCT 41.2 38.2* 37.0* 38.0*  39.0 42.9  MCV 90.0 88.6  --   --  90.1  PLT 375 340  --   --  161   Basic Metabolic Panel: Recent Labs  Lab 03/02/21 0512 03/03/21 0503 03/04/21 0358 03/05/21 0409 03/05/21 1327 03/05/21 1334 03/06/21 0644  NA 137 134* 135 133* 137 137  137 136  K 4.2 4.1 4.4 4.4 4.4 4.5  4.5 4.5  CL 101 102 101 103  --   --  104  CO2 25 21* 22 24  --   --  25  GLUCOSE 153* 189* 204* 157*  --   --  150*  BUN 28* 29* 38* 41*  --   --  30*  CREATININE 1.07 0.78 0.98 1.07  --   --  1.02  CALCIUM 9.4 9.4 9.5 9.1  --   --  8.9  MG 2.1  --   --   --   --   --  2.3  PHOS  --   --   --   --   --   --  3.3   GFR: Estimated Creatinine Clearance: 79.2 mL/min (by  C-G formula based on SCr of 1.02 mg/dL). Liver Function Tests: Recent Labs  Lab 03/01/21 1343  AST 26  ALT 20  ALKPHOS 48  BILITOT 0.7  PROT 7.4  ALBUMIN 4.0   No results for input(s): LIPASE, AMYLASE in the last 168 hours. No results for input(s): AMMONIA in the last 168 hours. Coagulation Profile: No results for input(s): INR, PROTIME in the last 168 hours. Cardiac Enzymes: No results for input(s): CKTOTAL, CKMB, CKMBINDEX, TROPONINI in the last 168 hours. BNP (last 3 results) No results for input(s): PROBNP in the last 8760 hours. HbA1C: Recent Labs    03/05/21 0409 03/06/21 0645  HGBA1C 6.7* 6.7*   CBG: Recent Labs  Lab 03/05/21 1142 03/05/21 1628 03/05/21  2123 03/06/21 0620 03/06/21 1156  GLUCAP 169* 201* 270* 156* 205*   Lipid Profile: No results for input(s): CHOL, HDL, LDLCALC, TRIG, CHOLHDL, LDLDIRECT in the last 72 hours. Thyroid Function Tests: No results for input(s): TSH, T4TOTAL, FREET4, T3FREE, THYROIDAB in the last 72 hours. Anemia Panel: No results for input(s): VITAMINB12, FOLATE, FERRITIN, TIBC, IRON, RETICCTPCT in the last 72 hours. Urine analysis: No results found for: COLORURINE, APPEARANCEUR, LABSPEC, PHURINE, GLUCOSEU, HGBUR, BILIRUBINUR, KETONESUR, PROTEINUR, UROBILINOGEN, NITRITE, LEUKOCYTESUR Sepsis Labs: @LABRCNTIP (procalcitonin:4,lacticidven:4)  ) Recent Results (from the past 240 hour(s))  Resp Panel by RT-PCR (Flu A&B, Covid) Nasopharyngeal Swab     Status: None   Collection Time: 03/01/21  3:05 PM   Specimen: Nasopharyngeal Swab; Nasopharyngeal(NP) swabs in vial transport medium  Result Value Ref Range Status   SARS Coronavirus 2 by RT PCR NEGATIVE NEGATIVE Final    Comment: (NOTE) SARS-CoV-2 target nucleic acids are NOT DETECTED.  The SARS-CoV-2 RNA is generally detectable in upper respiratory specimens during the acute phase of infection. The lowest concentration of SARS-CoV-2 viral copies this assay can detect is 138  copies/mL. A negative result does not preclude SARS-Cov-2 infection and should not be used as the sole basis for treatment or other patient management decisions. A negative result may occur with  improper specimen collection/handling, submission of specimen other than nasopharyngeal swab, presence of viral mutation(s) within the areas targeted by this assay, and inadequate number of viral copies(<138 copies/mL). A negative result must be combined with clinical observations, patient history, and epidemiological information. The expected result is Negative.  Fact Sheet for Patients:  EntrepreneurPulse.com.au  Fact Sheet for Healthcare Providers:  IncredibleEmployment.be  This test is no t yet approved or cleared by the [place] FDA and  has been authorized for detection and/or diagnosis of SARS-CoV-2 by FDA under an Emergency Use Authorization (EUA). This EUA will remain  in effect (meaning this test can be used) for the duration of the COVID-19 declaration under Section 564(b)(1) of the Act, 21 U.S.C.section 360bbb-3(b)(1), unless the authorization is terminated  or revoked sooner.       Influenza A by PCR NEGATIVE NEGATIVE Final   Influenza B by PCR NEGATIVE NEGATIVE Final    Comment: (NOTE) The Xpert Xpress SARS-CoV-2/FLU/RSV plus assay is intended as an aid in the diagnosis of influenza from Nasopharyngeal swab specimens and should not be used as a sole basis for treatment. Nasal washings and aspirates are unacceptable for Xpert Xpress SARS-CoV-2/FLU/RSV testing.  Fact Sheet for Patients: EntrepreneurPulse.com.au  Fact Sheet for Healthcare Providers: IncredibleEmployment.be  This test is not yet approved or cleared by the [place] FDA and has been authorized for detection and/or diagnosis of SARS-CoV-2 by FDA under an Emergency Use Authorization (EUA). This EUA will remain in effect (meaning  this test can be used) for the duration of the COVID-19 declaration under Section 564(b)(1) of the Act, 21 U.S.C. section 360bbb-3(b)(1), unless the authorization is terminated or revoked.  Performed at [facility], [address], [place], [place] [address]       Radiology Studies: DG Orthopantogram  Result Date: 03/05/2021 CLINICAL DATA:  Severe mitral regurgitation.  No oral pain. EXAM: ORTHOPANTOGRAM/PANORAMIC COMPARISON:  None. FINDINGS: Previous extractions of the upper and lower third molars bilaterally, partial resection of the right upper second molar, and resection of the left lower second bicuspid. Dental caries demonstrated in the left upper second molar, the left lower first and second molars, and the right lower second molar. Reconstructions demonstrated in  the right upper cuspid and first bicuspid, the left upper cuspid and first bicuspid, the right lower second bicuspid and first and second molars, and changes consistent with root canal in the left lower first molar. Minimal bone resorption suggested around the right lower second molar. No definite periapical lucencies. No acute fracture or dislocation of the visualized mandible. IMPRESSION: Multiple dental caries and prior tooth extractions. No definite periapical lucencies. No acute fracture or dislocation of the mandible. Electronically Signed   By: Lucienne Capers M.D.   On: 03/05/2021 21:40   CARDIAC CATHETERIZATION  Result Date: 03/05/2021  The left ventricular systolic function is normal. The left ventricular ejection fraction is 55-65% by visual estimate.  LV end diastolic pressure is mildly elevated.  There is moderate (3+) mitral regurgitation.  Hemodynamic findings consistent with mild pulmonary hypertension.  Angiographically normal coronary arteries  SUMMARY  Angiographically Normal Coronary Arteries  Mild Pulmonary Hypertension with mean PAP 30 mmHg, PCWP 5 mmHg with LVEDP 15 mmHg.  Mild-moderately reduced  Cardiac Output-Cardiac Index (Fick) 4.44-2.12. RECOMMENDATIONS  Continue with evaluation for mitral valve surgery Glenetta Hew, MD  ECHO TEE  Result Date: 03/05/2021    TRANSESOPHOGEAL ECHO REPORT   Patient Name:   Michael Valenzuela Date of Exam: 03/05/2021 Medical Rec #:  [id number]  Height:       72.0 in Accession #:    [id number] Weight:       190.9 lb Date of Birth:  1956/05/19  BSA:          2.089 m Patient Age:    61 years   BP:           113/78 mmHg Patient Gender: M          HR:           76 bpm. Exam Location:  Inpatient Procedure: 2D Echo, 3D Echo, Cardiac Doppler and Color Doppler Indications:     Mitral valve prolapse  History:         Patient has prior history of Echocardiogram examinations, most                  recent 03/02/2021. Arrythmias:Atrial Fibrillation,                  Signs/Symptoms:Shortness of Breath; Risk Factors:Hypertension,                  Dyslipidemia, Diabetes and Current Smoker.  Sonographer:     Clayton Lefort RDCS (AE) Referring Phys:  [id number] [place] Diagnosing Phys: Oswaldo Milian MD PROCEDURE: After discussion of the risks and benefits of a TEE, an informed consent was obtained from the patient. The transesophogeal probe was passed without difficulty through the esophogus of the patient. Local oropharyngeal anesthetic was provided with Cetacaine. Sedation performed by different physician. The patient was monitored while under deep sedation. Anesthestetic sedation was provided intravenously by Anesthesiology: 170.6mg  of Propofol. Image quality was good. The patient developed no complications during the procedure. IMPRESSIONS  1. Left ventricular ejection fraction, by estimation, is 60 to 65%. The left ventricle has normal function.  2. Right ventricular systolic function is normal. The right ventricular size is mildly enlarged.  3. Left atrial size was severely dilated. No left atrial/left atrial appendage thrombus was detected.  4. Right atrial size was mildly  dilated.  5. The aortic valve is tricuspid. Aortic valve regurgitation is not visualized.  6. The mitral valve is abnormal. Flail P2. Severe mitral valve regurgitation. MR severe by  both 2D ERO and 3D VCA. Systolic flow reversal in pulmonary veins, consistent with severe MR. FINDINGS  Left Ventricle: Left ventricular ejection fraction, by estimation, is 60 to 65%. The left ventricle has normal function. The left ventricular internal cavity size was normal in size. Right Ventricle: The right ventricular size is mildly enlarged. No increase in right ventricular wall thickness. Right ventricular systolic function is normal. Left Atrium: Left atrial size was severely dilated. No left atrial/left atrial appendage thrombus was detected. Right Atrium: Right atrial size was mildly dilated. Pericardium: There is no evidence of pericardial effusion. Mitral Valve: The mitral valve is abnormal. Severe mitral valve regurgitation. Tricuspid Valve: The tricuspid valve is normal in structure. Tricuspid valve regurgitation is mild. Aortic Valve: The aortic valve is tricuspid. Aortic valve regurgitation is not visualized. Pulmonic Valve: The pulmonic valve was grossly normal. Pulmonic valve regurgitation is not visualized. Aorta: The aortic root is normal in size and structure. IAS/Shunts: No atrial level shunt detected by color flow Doppler.  MR Peak grad:    61.2 mmHg MR Mean grad:    39.0 mmHg MR Vmax:         391.00 cm/s MR Vmean:        292.0 cm/s MR PISA:         18.16 cm MR PISA Eff ROA: 158 mm MR PISA Radius:  1.70 cm Oswaldo Milian MD Electronically signed by Oswaldo Milian MD Signature Date/Time: 03/05/2021/1:56:06 PM    Final      Scheduled Meds: . albuterol  2.5 mg Nebulization BID  . amLODipine  5 mg Oral Daily  . enoxaparin (LOVENOX) injection  80 mg Subcutaneous Q12H  . fenofibrate  160 mg Oral Daily  . furosemide  40 mg Intravenous Daily  . guaiFENesin  600 mg Oral BID  . insulin aspart  0-5  Units Subcutaneous QHS  . insulin aspart  0-6 Units Subcutaneous TID WC  . insulin detemir  5 Units Subcutaneous BID  . metoprolol tartrate  25 mg Oral BID  . oxyCODONE  15 mg Oral Q6H  . pantoprazole  40 mg Oral Daily  . potassium chloride  40 mEq Oral Daily  . rosuvastatin  10 mg Oral Daily  . sodium chloride flush  3 mL Intravenous Q12H  . sodium chloride flush  3 mL Intravenous Q12H  . traZODone  100 mg Oral QHS   Continuous Infusions: . sodium chloride    . sodium chloride       LOS: 5 days   Time Spent in minutes   45 minutes  Ziomara Birenbaum D.O. on 03/06/2021 at 12:40 PM  Between 7am to 7pm - Please see pager noted on [email or web address]  After 7pm go to [email or web address]  And look for the night coverage person covering for me after hours  [facility] Office  [phone]

## 2021-03-06 NOTE — Plan of Care (Signed)
  Problem: Activity: Goal: Risk for activity intolerance will decrease Outcome: Progressing   Problem: Nutrition: Goal: Adequate nutrition will be maintained Outcome: Progressing   Problem: Coping: Goal: Level of anxiety will decrease Outcome: Progressing   Problem: Clinical Measurements: Goal: Ability to maintain clinical measurements within normal limits will improve Outcome: Progressing Goal: Will remain free from infection Outcome: Progressing Goal: Diagnostic test results will improve Outcome: Progressing Goal: Respiratory complications will improve Outcome: Progressing Goal: Cardiovascular complication will be avoided Outcome: Progressing

## 2021-03-07 ENCOUNTER — Inpatient Hospital Stay (HOSPITAL_COMMUNITY): Payer: Medicare Other

## 2021-03-07 DIAGNOSIS — F40232 Fear of other medical care: Secondary | ICD-10-CM

## 2021-03-07 DIAGNOSIS — I10 Essential (primary) hypertension: Secondary | ICD-10-CM | POA: Diagnosis not present

## 2021-03-07 DIAGNOSIS — I340 Nonrheumatic mitral (valve) insufficiency: Secondary | ICD-10-CM | POA: Diagnosis not present

## 2021-03-07 DIAGNOSIS — I5033 Acute on chronic diastolic (congestive) heart failure: Secondary | ICD-10-CM | POA: Diagnosis not present

## 2021-03-07 DIAGNOSIS — I4891 Unspecified atrial fibrillation: Secondary | ICD-10-CM | POA: Diagnosis not present

## 2021-03-07 DIAGNOSIS — R0600 Dyspnea, unspecified: Secondary | ICD-10-CM | POA: Diagnosis not present

## 2021-03-07 LAB — PROTIME-INR
INR: 1 (ref 0.8–1.2)
Prothrombin Time: 13.6 seconds (ref 11.4–15.2)

## 2021-03-07 LAB — URINALYSIS, ROUTINE W REFLEX MICROSCOPIC
Bilirubin Urine: NEGATIVE
Glucose, UA: NEGATIVE mg/dL
Hgb urine dipstick: NEGATIVE
Ketones, ur: NEGATIVE mg/dL
Leukocytes,Ua: NEGATIVE
Nitrite: NEGATIVE
Protein, ur: NEGATIVE mg/dL
Specific Gravity, Urine: 1.023 (ref 1.005–1.030)
pH: 6 (ref 5.0–8.0)

## 2021-03-07 LAB — PULMONARY FUNCTION TEST
DL/VA % pred: 85 %
DL/VA: 3.54 ml/min/mmHg/L
DLCO cor % pred: 71 %
DLCO cor: 19.79 ml/min/mmHg
DLCO unc % pred: 70 %
DLCO unc: 19.51 ml/min/mmHg
FEF 25-75 Pre: 2.81 L/sec
FEF2575-%Pred-Pre: 99 %
FEV1-%Pred-Pre: 85 %
FEV1-Pre: 3.04 L
FEV1FVC-%Pred-Pre: 106 %
FEV6-%Pred-Pre: 84 %
FEV6-Pre: 3.83 L
FEV6FVC-%Pred-Pre: 105 %
FVC-%Pred-Pre: 80 %
FVC-Pre: 3.83 L
Pre FEV1/FVC ratio: 79 %
Pre FEV6/FVC Ratio: 100 %
RV % pred: 112 %
RV: 2.7 L
TLC % pred: 91 %
TLC: 6.63 L

## 2021-03-07 LAB — BASIC METABOLIC PANEL
Anion gap: 9 (ref 5–15)
BUN: 29 mg/dL — ABNORMAL HIGH (ref 8–23)
CO2: 24 mmol/L (ref 22–32)
Calcium: 8.7 mg/dL — ABNORMAL LOW (ref 8.9–10.3)
Chloride: 101 mmol/L (ref 98–111)
Creatinine, Ser: 0.9 mg/dL (ref 0.61–1.24)
GFR, Estimated: >60 mL/min (ref >60)
Glucose, Bld: 144 mg/dL — ABNORMAL HIGH (ref 70–99)
Potassium: 4 mmol/L (ref 3.5–5.1)
Sodium: 134 mmol/L — ABNORMAL LOW (ref 135–145)

## 2021-03-07 LAB — GLUCOSE, CAPILLARY
Glucose-Capillary: 162 mg/dL — ABNORMAL HIGH (ref 70–99)
Glucose-Capillary: 193 mg/dL — ABNORMAL HIGH (ref 70–99)
Glucose-Capillary: 297 mg/dL — ABNORMAL HIGH (ref 70–99)
Glucose-Capillary: 165 mg/dL — ABNORMAL HIGH (ref 70–99)

## 2021-03-07 LAB — TYPE AND SCREEN
ABO/RH(D): A POS
Antibody Screen: NEGATIVE

## 2021-03-07 LAB — SURGICAL PCR SCREEN
MRSA, PCR: NEGATIVE
Staphylococcus aureus: NEGATIVE

## 2021-03-07 LAB — ABO/RH: ABO/RH(D): A POS

## 2021-03-07 LAB — APTT: aPTT: 26 seconds (ref 24–36)

## 2021-03-07 MED ORDER — DEXMEDETOMIDINE HCL IN NACL 400 MCG/100ML IV SOLN
0.1000 ug/kg/h | INTRAVENOUS | Status: AC
  Administered 2021-03-08 (×2): .7 ug/kg/h via INTRAVENOUS
  Filled 2021-03-07: qty 100

## 2021-03-07 MED ORDER — METOPROLOL TARTRATE 12.5 MG HALF TABLET
12.5000 mg | ORAL_TABLET | Freq: Once | ORAL | Status: AC
  Administered 2021-03-08: 12.5 mg via ORAL
  Filled 2021-03-07: qty 1

## 2021-03-07 MED ORDER — TRANEXAMIC ACID 1000 MG/10ML IV SOLN
1.5000 mg/kg/h | INTRAVENOUS | Status: AC
  Administered 2021-03-08: 1.5 mg/kg/h via INTRAVENOUS
  Filled 2021-03-07: qty 25

## 2021-03-07 MED ORDER — PLASMA-LYTE 148 IV SOLN
INTRAVENOUS | Status: DC
  Filled 2021-03-07: qty 2.5

## 2021-03-07 MED ORDER — NITROGLYCERIN IN D5W 200-5 MCG/ML-% IV SOLN
2.0000 ug/min | INTRAVENOUS | Status: DC
  Filled 2021-03-07: qty 250

## 2021-03-07 MED ORDER — NOREPINEPHRINE 4 MG/250ML-% IV SOLN
0.0000 ug/min | INTRAVENOUS | Status: AC
  Administered 2021-03-08: 2 ug/min via INTRAVENOUS
  Filled 2021-03-07: qty 250

## 2021-03-07 MED ORDER — CHLORHEXIDINE GLUCONATE CLOTH 2 % EX PADS
6.0000 | MEDICATED_PAD | Freq: Once | CUTANEOUS | Status: AC
  Administered 2021-03-08: 6 via TOPICAL

## 2021-03-07 MED ORDER — INSULIN REGULAR(HUMAN) IN NACL 100-0.9 UT/100ML-% IV SOLN
INTRAVENOUS | Status: AC
  Administered 2021-03-08: 4.6 [IU]/h via INTRAVENOUS
  Filled 2021-03-07: qty 100

## 2021-03-07 MED ORDER — EPINEPHRINE HCL 5 MG/250ML IV SOLN IN NS
0.0000 ug/min | INTRAVENOUS | Status: AC
  Administered 2021-03-08: 2 ug/min via INTRAVENOUS
  Filled 2021-03-07: qty 250

## 2021-03-07 MED ORDER — SODIUM CHLORIDE 0.9 % IV SOLN
750.0000 mg | INTRAVENOUS | Status: AC
  Administered 2021-03-08: 750 mg via INTRAVENOUS
  Filled 2021-03-07: qty 750

## 2021-03-07 MED ORDER — CHLORHEXIDINE GLUCONATE CLOTH 2 % EX PADS
6.0000 | MEDICATED_PAD | Freq: Once | CUTANEOUS | Status: AC
  Administered 2021-03-07: 6 via TOPICAL

## 2021-03-07 MED ORDER — SODIUM CHLORIDE 0.9 % IV SOLN
1.5000 g | INTRAVENOUS | Status: AC
  Administered 2021-03-08: 1.5 g via INTRAVENOUS
  Filled 2021-03-07: qty 1.5

## 2021-03-07 MED ORDER — TEMAZEPAM 15 MG PO CAPS
15.0000 mg | ORAL_CAPSULE | Freq: Once | ORAL | Status: AC | PRN
  Administered 2021-03-07: 15 mg via ORAL
  Filled 2021-03-07: qty 1

## 2021-03-07 MED ORDER — TRANEXAMIC ACID (OHS) BOLUS VIA INFUSION
15.0000 mg/kg | INTRAVENOUS | Status: AC
  Administered 2021-03-08: 1282.5 mg via INTRAVENOUS
  Filled 2021-03-07: qty 1283

## 2021-03-07 MED ORDER — TRANEXAMIC ACID (OHS) PUMP PRIME SOLUTION
2.0000 mg/kg | INTRAVENOUS | Status: DC
  Filled 2021-03-07: qty 1.71

## 2021-03-07 MED ORDER — BISACODYL 5 MG PO TBEC
5.0000 mg | DELAYED_RELEASE_TABLET | Freq: Once | ORAL | Status: AC
  Administered 2021-03-07: 5 mg via ORAL
  Filled 2021-03-07: qty 1

## 2021-03-07 MED ORDER — CHLORHEXIDINE GLUCONATE 0.12 % MT SOLN
15.0000 mL | Freq: Once | OROMUCOSAL | Status: AC
  Administered 2021-03-08: 15 mL via OROMUCOSAL
  Filled 2021-03-07: qty 15

## 2021-03-07 MED ORDER — MAGNESIUM SULFATE 50 % IJ SOLN
40.0000 meq | INTRAMUSCULAR | Status: DC
  Filled 2021-03-07: qty 9.85

## 2021-03-07 MED ORDER — PHENYLEPHRINE HCL-NACL 20-0.9 MG/250ML-% IV SOLN
30.0000 ug/min | INTRAVENOUS | Status: AC
  Administered 2021-03-08: 25 ug/min via INTRAVENOUS
  Filled 2021-03-07: qty 250

## 2021-03-07 MED ORDER — POTASSIUM CHLORIDE 2 MEQ/ML IV SOLN
80.0000 meq | INTRAVENOUS | Status: DC
  Filled 2021-03-07: qty 40

## 2021-03-07 MED ORDER — VANCOMYCIN HCL 1500 MG/300ML IV SOLN
1500.0000 mg | INTRAVENOUS | Status: AC
  Administered 2021-03-08: 1500 mg via INTRAVENOUS
  Filled 2021-03-07: qty 300

## 2021-03-07 MED ORDER — MILRINONE LACTATE IN DEXTROSE 20-5 MG/100ML-% IV SOLN
0.3000 ug/kg/min | INTRAVENOUS | Status: DC
  Filled 2021-03-07: qty 100

## 2021-03-07 MED ORDER — SODIUM CHLORIDE 0.9 % IV SOLN
INTRAVENOUS | Status: DC
  Filled 2021-03-07: qty 30

## 2021-03-07 NOTE — Anesthesia Preprocedure Evaluation (Addendum)
Anesthesia Evaluation  Patient identified by MRN, date of birth, ID band Patient awake    Reviewed: Allergy & Precautions, NPO status , Patient's Chart, lab work & pertinent test results  History of Anesthesia Complications Negative for: history of anesthetic complications  Airway Mallampati: III  TM Distance: >3 FB Neck ROM: Full    Dental  (+) Dental Advisory Given, Chipped, Poor Dentition   Pulmonary former smoker,    Pulmonary exam normal        Cardiovascular hypertension, Pt. on medications +CHF  + dysrhythmias Atrial Fibrillation + Valvular Problems/Murmurs MR  Rhythm:Regular Rate:Normal + Systolic murmurs- Diastolic murmurs  '22 Carotid US - 1-39% b/l ICAS  '22 Cath - The left ventricular systolic function is normal. The left ventricular ejection fraction is 55-65% by visual estimate. LV end diastolic pressure is mildly elevated. There is moderate (3+) mitral regurgitation. Hemodynamic findings consistent with mild pulmonary hypertension. Angiographically normal coronary arteries  '22 TEE - EF 60 to 65%. Normal LV size and function. RV mildly enlarged. LA was severely dilated. RA was mildly dilated. Mild TR. Severe MR. No description of leaflets, but TTE describes holosystolic prolapse of posterior MV leaflet. MR Peak grad:  61.2 mmHg  MR Mean grad:  39.0 mmHg  MR Vmax:     391.00 cm/s  MR Vmean:    292.0 cm/s  MR PISA:     18.16 cm  MR PISA Eff ROA: 158 mm  MR PISA Radius: 1.70 cm      Neuro/Psych negative neurological ROS  negative psych ROS   GI/Hepatic negative GI ROS, Neg liver ROS,   Endo/Other  diabetes, Type 2, Oral Hypoglycemic Agents  Renal/GU negative Renal ROS     Musculoskeletal  (+) Fibromyalgia -  Abdominal   Peds  Hematology negative hematology ROS (+)   Anesthesia Other Findings Covid test negative   Reproductive/Obstetrics                            Anesthesia Physical Anesthesia Plan  ASA: III  Anesthesia Plan: General   Post-op Pain Management:    Induction: Intravenous  PONV Risk Score and Plan: 2 and Treatment may vary due to age or medical condition  Airway Management Planned: Oral ETT  Additional Equipment: Arterial line, CVP, PA Cath, TEE and Ultrasound Guidance Line Placement  Intra-op Plan:   Post-operative Plan: Post-operative intubation/ventilation  Informed Consent: I have reviewed the patients History and Physical, chart, labs and discussed the procedure including the risks, benefits and alternatives for the proposed anesthesia with the patient or authorized representative who has indicated his/her understanding and acceptance.     Dental advisory given  Plan Discussed with: CRNA and Anesthesiologist  Anesthesia Plan Comments:        Anesthesia Quick Evaluation

## 2021-03-07 NOTE — Progress Notes (Signed)
PROGRESS NOTE    Michael Valenzuela  [id number] DOB: 1956-01-18 DOA: 03/01/2021 PCP: Chevis Pretty, FNP   Brief Narrative:  HPI On 03/01/2021 by Dr. Loma Boston Montego Seedorf is a 65 y.o. male with a history of diabetes, fibromyalgia, hyperlipidemia and hypertension.  Patient presents with 3 to 4 days pleurisy and orthopnea.  His symptoms have been worsening with past couple of days.  He has been having increasing difficulty sleeping due to the pleurisy and presented to urgent care for evaluation.  EKG was done and the patient was found to be in atrial fibrillation.  He was sent to the hospital for further evaluation.  Normal here, he was in sinus rhythm, but then converted into atrial fibrillation.  He has been rate controlled.  Patient has been mildly hypoxic, which improved with oxygen via nasal cannula.  No palliating or provoking factors.  Denies fevers, chills, nausea, vomiting.  Denies chest pain  Interim history Patient was admitted with acute hypoxic respiratory failure as well as heart failure exacerbation.  Initially was admitted to [facility] but then transferred to [facility].  Patient was placed on diuretics as well as nebulizers and steroids for possible bronchitis.  Echocardiogram showed moderate to severe MR.  Cardiology consulted and appreciated.  TEE showed severe mitral valve regurgitation.  Right and left heart cath performed.  TCTS consulted. Assessment & Plan   Acute hypoxic respiratory failure -Possibly multifactorial including CHF exacerbation, new diagnosis of atrial fibrillation, severe mitral valve regurgitation -oxygen saturations 87-89% on room air upon presentation and was placed on 4L O2 -Patient now weaned to room air -CTA showed no PE  Atrial fibrillation, new onset -Eliquis currently held -Continue full dose Lovenox  Mitral regurgitation -TEE showed an EF of 60 to 65%.  LA size severely dilated.  No atrial appendage thrombus detected.  Mitral  valve is abnormal.  Flail P2.  Severe mitral valve regurgitation.  Severe MR. -Cardiothoracic surgery consulted and appreciated and feels patient would be a good candidate for mitral valve replacement or repair with biatrial ablation.  Planning for possible intervention on [id number]  Acute diastolic CHF exacerbation -Echocardiogram showed an EF of 70-75%, no regional motion abnormalities.  LV diastolic function could not be evaluated. -CTA chest showed moderate bilateral pleural effusions, small septal thickening consistent with pulmonary edema.  -Monitor intake and output, daily weights -Continue IV Lasix 40 mg daily  Concern for bronchitis -Noted on CTA: Peribronchial groundglass opacities in the right perihilar lung and right upper lobe. Bronchial wall thickening-bronchitis or congestion. -Patient may need repeat imaging for follow-up of resolution -Completed steroids  Hyperlipidemia -Continue statin, fenofibrate  Diabetes mellitus, type II -Metformin and Januvia held -Continue Levemir, insulin sliding scale, CBG monitoring  Essential hypertension -Continue amlodipine, Lasix  DVT Prophylaxis Lovenox  Code Status: Full  Family Communication: None at bedside  Disposition Plan:  Status is: Inpatient  Remains inpatient appropriate because:IV treatments appropriate due to intensity of illness or inability to take PO and Inpatient level of care appropriate due to severity of illness   Dispo: The patient is from: Home              Anticipated d/c is to: Home              Patient currently is not medically stable to d/c.   Difficult to place patient No  Consultants Cardiology Cardiothoracic surgery  Procedures  Echocardiogram TEE  Antibiotics   Anti-infectives (From admission, onward)   Start  Dose/Rate Route Frequency Ordered Stop   03/08/21 0400  vancomycin (VANCOREADY) IVPB 1500 mg/300 mL        1,500 mg 150 mL/hr over 120 Minutes Intravenous To Surgery 03/07/21  0720 03/09/21 0400   03/08/21 0400  cefUROXime (ZINACEF) 1.5 g in sodium chloride 0.9 % 100 mL IVPB        1.5 g 200 mL/hr over 30 Minutes Intravenous To Surgery 03/07/21 0720 03/09/21 0400   03/08/21 0400  cefUROXime (ZINACEF) 750 mg in sodium chloride 0.9 % 100 mL IVPB        750 mg 200 mL/hr over 30 Minutes Intravenous To Surgery 03/07/21 0720 03/09/21 0400      Subjective:   Michael Valenzuela seen and examined today.  Continues to have intermittent shortness of breath.  States he had a rough time yesterday.  Currently denies chest pain, abdominal pain, nausea or vomiting, diarrhea or constipation, dizziness or headache.  Hoping to get this procedure over with we can go home soon.   Objective:   Vitals:   03/07/21 0211 03/07/21 0424 03/07/21 0823 03/07/21 0913  BP:  99/74 121/90   Pulse:  64 65   Resp:  18 18   Temp:  97.9 F (36.6 C) 98 F (36.7 C)   TempSrc:  Oral Oral   SpO2:  96% 98% 96%  Weight: 85.5 kg     Height:        Intake/Output Summary (Last 24 hours) at 03/07/2021 1007 Last data filed at 03/07/2021 0912 Gross per 24 hour  Intake 703 ml  Output 2775 ml  Net -2072 ml   Filed Weights   03/05/21 0325 03/06/21 0415 03/07/21 0211  Weight: 86.6 kg 86 kg 85.5 kg   Exam  General: Well developed, chronically ill appearing, NAD  HEENT: NCAT, mucous membranes moist.   Cardiovascular: S1 S2 auscultated, irregular, 3/6SEM  Respiratory: Clear to auscultation bilaterally   Abdomen: Soft, nontender, nondistended, + bowel sounds  Extremities: warm dry without cyanosis clubbing or edema  Neuro: AAOx3, nonfocal  Psych: pleasant, appropriate mood and affect   Data Reviewed: I have personally reviewed following labs and imaging studies  CBC: Recent Labs  Lab 03/01/21 1343 03/05/21 0409 03/05/21 1327 03/05/21 1334 03/06/21 0644  WBC 10.0 15.4*  --   --  9.8  HGB 13.9 12.8* 12.6* 12.9*  13.3 14.1  HCT 41.2 38.2* 37.0* 38.0*  39.0 42.9  MCV 90.0 88.6  --   --   90.1  PLT 375 340  --   --  720   Basic Metabolic Panel: Recent Labs  Lab 03/02/21 0512 03/03/21 0503 03/04/21 0358 03/05/21 0409 03/05/21 1327 03/05/21 1334 03/06/21 0644 03/07/21 0336  NA 137 134* 135 133* 137 137  137 136 134*  K 4.2 4.1 4.4 4.4 4.4 4.5  4.5 4.5 4.0  CL 101 102 101 103  --   --  104 101  CO2 25 21* 22 24  --   --  25 24  GLUCOSE 153* 189* 204* 157*  --   --  150* 144*  BUN 28* 29* 38* 41*  --   --  30* 29*  CREATININE 1.07 0.78 0.98 1.07  --   --  1.02 0.90  CALCIUM 9.4 9.4 9.5 9.1  --   --  8.9 8.7*  MG 2.1  --   --   --   --   --  2.3  --   PHOS  --   --   --   --   --   --  3.3  --    GFR: Estimated Creatinine Clearance: 89.8 mL/min (by C-G formula based on SCr of 0.9 mg/dL). Liver Function Tests: Recent Labs  Lab 03/01/21 1343  AST 26  ALT 20  ALKPHOS 48  BILITOT 0.7  PROT 7.4  ALBUMIN 4.0   No results for input(s): LIPASE, AMYLASE in the last 168 hours. No results for input(s): AMMONIA in the last 168 hours. Coagulation Profile: No results for input(s): INR, PROTIME in the last 168 hours. Cardiac Enzymes: No results for input(s): CKTOTAL, CKMB, CKMBINDEX, TROPONINI in the last 168 hours. BNP (last 3 results) No results for input(s): PROBNP in the last 8760 hours. HbA1C: Recent Labs    03/05/21 0409 03/06/21 0645  HGBA1C 6.7* 6.7*   CBG: Recent Labs  Lab 03/06/21 0620 03/06/21 1156 03/06/21 1617 03/06/21 2123 03/07/21 0557  GLUCAP 156* 205* 182* 167* 162*   Lipid Profile: No results for input(s): CHOL, HDL, LDLCALC, TRIG, CHOLHDL, LDLDIRECT in the last 72 hours. Thyroid Function Tests: No results for input(s): TSH, T4TOTAL, FREET4, T3FREE, THYROIDAB in the last 72 hours. Anemia Panel: No results for input(s): VITAMINB12, FOLATE, FERRITIN, TIBC, IRON, RETICCTPCT in the last 72 hours. Urine analysis:    Component Value Date/Time   COLORURINE YELLOW 03/07/2021 0757   APPEARANCEUR HAZY (A) 03/07/2021 0757   LABSPEC 1.023  03/07/2021 0757   PHURINE 6.0 03/07/2021 0757   GLUCOSEU NEGATIVE 03/07/2021 0757   HGBUR NEGATIVE 03/07/2021 0757   BILIRUBINUR NEGATIVE 03/07/2021 0757   KETONESUR NEGATIVE 03/07/2021 0757   PROTEINUR NEGATIVE 03/07/2021 0757   NITRITE NEGATIVE 03/07/2021 0757   LEUKOCYTESUR NEGATIVE 03/07/2021 0757   Sepsis Labs: @LABRCNTIP (procalcitonin:4,lacticidven:4)  ) Recent Results (from the past 240 hour(s))  Resp Panel by RT-PCR (Flu A&B, Covid) Nasopharyngeal Swab     Status: None   Collection Time: 03/01/21  3:05 PM   Specimen: Nasopharyngeal Swab; Nasopharyngeal(NP) swabs in vial transport medium  Result Value Ref Range Status   SARS Coronavirus 2 by RT PCR NEGATIVE NEGATIVE Final    Comment: (NOTE) SARS-CoV-2 target nucleic acids are NOT DETECTED.  The SARS-CoV-2 RNA is generally detectable in upper respiratory specimens during the acute phase of infection. The lowest concentration of SARS-CoV-2 viral copies this assay can detect is 138 copies/mL. A negative result does not preclude SARS-Cov-2 infection and should not be used as the sole basis for treatment or other patient management decisions. A negative result may occur with  improper specimen collection/handling, submission of specimen other than nasopharyngeal swab, presence of viral mutation(s) within the areas targeted by this assay, and inadequate number of viral copies(<138 copies/mL). A negative result must be combined with clinical observations, patient history, and epidemiological information. The expected result is Negative.  Fact Sheet for Patients:  EntrepreneurPulse.com.au  Fact Sheet for Healthcare Providers:  IncredibleEmployment.be  This test is no t yet approved or cleared by the [place] FDA and  has been authorized for detection and/or diagnosis of SARS-CoV-2 by FDA under an Emergency Use Authorization (EUA). This EUA will remain  in effect (meaning this test  can be used) for the duration of the COVID-19 declaration under Section 564(b)(1) of the Act, 21 U.S.C.section 360bbb-3(b)(1), unless the authorization is terminated  or revoked sooner.       Influenza A by PCR NEGATIVE NEGATIVE Final   Influenza B by PCR NEGATIVE NEGATIVE Final    Comment: (NOTE) The Xpert Xpress SARS-CoV-2/FLU/RSV plus assay is intended as an aid in the diagnosis of influenza from Nasopharyngeal  swab specimens and should not be used as a sole basis for treatment. Nasal washings and aspirates are unacceptable for Xpert Xpress SARS-CoV-2/FLU/RSV testing.  Fact Sheet for Patients: EntrepreneurPulse.com.au  Fact Sheet for Healthcare Providers: IncredibleEmployment.be  This test is not yet approved or cleared by the [place] FDA and has been authorized for detection and/or diagnosis of SARS-CoV-2 by FDA under an Emergency Use Authorization (EUA). This EUA will remain in effect (meaning this test can be used) for the duration of the COVID-19 declaration under Section 564(b)(1) of the Act, 21 U.S.C. section 360bbb-3(b)(1), unless the authorization is terminated or revoked.  Performed at [facility], [address] [place], [place] [address]   Surgical pcr screen     Status: None   Collection Time: 03/07/21  7:56 AM   Specimen: Nasal Mucosa; Nasal Swab  Result Value Ref Range Status   MRSA, PCR NEGATIVE NEGATIVE Final   Staphylococcus aureus NEGATIVE NEGATIVE Final    Comment: (NOTE) The Xpert SA Assay (FDA approved for NASAL specimens in patients 67 years of age and older), is one component of a comprehensive surveillance program. It is not intended to diagnose infection nor to guide or monitor treatment. Performed at [facility], [place] [address], [address], [place] [address]       Radiology Studies: DG Orthopantogram  Result Date: 03/05/2021 CLINICAL DATA:  Severe mitral regurgitation.  No oral pain. EXAM:  ORTHOPANTOGRAM/PANORAMIC COMPARISON:  None. FINDINGS: Previous extractions of the upper and lower third molars bilaterally, partial resection of the right upper second molar, and resection of the left lower second bicuspid. Dental caries demonstrated in the left upper second molar, the left lower first and second molars, and the right lower second molar. Reconstructions demonstrated in the right upper cuspid and first bicuspid, the left upper cuspid and first bicuspid, the right lower second bicuspid and first and second molars, and changes consistent with root canal in the left lower first molar. Minimal bone resorption suggested around the right lower second molar. No definite periapical lucencies. No acute fracture or dislocation of the visualized mandible. IMPRESSION: Multiple dental caries and prior tooth extractions. No definite periapical lucencies. No acute fracture or dislocation of the mandible. Electronically Signed   By: Lucienne Capers M.D.   On: 03/05/2021 21:40   CARDIAC CATHETERIZATION  Result Date: 03/05/2021  The left ventricular systolic function is normal. The left ventricular ejection fraction is 55-65% by visual estimate.  LV end diastolic pressure is mildly elevated.  There is moderate (3+) mitral regurgitation.  Hemodynamic findings consistent with mild pulmonary hypertension.  Angiographically normal coronary arteries  SUMMARY  Angiographically Normal Coronary Arteries  Mild Pulmonary Hypertension with mean PAP 30 mmHg, PCWP 5 mmHg with LVEDP 15 mmHg.  Mild-moderately reduced Cardiac Output-Cardiac Index (Fick) 4.44-2.12. RECOMMENDATIONS  Continue with evaluation for mitral valve surgery Glenetta Hew, MD  VAS US DOPPLER PRE CABG  Result Date: 03/06/2021 PREOPERATIVE VASCULAR EVALUATION Patient Name:  ABU HEAVIN  Date of Exam:   03/06/2021 Medical Rec #: [id number]   Accession #:    [id number] Date of Birth: 12-12-1955   Patient Gender: M Patient Age:   64Y Exam Location:   [facility] Procedure:      VAS US DOPPLER PRE CABG Referring Phys: [id number] [place] --------------------------------------------------------------------------------  Indications:      Pre-MVR. Risk Factors:     Hypertension, hyperlipidemia, Diabetes, past history of  smoking. Comparison Study: No prior studies. Performing Technologist: Darlin Coco RDMS,RVT  Examination Guidelines: A complete evaluation includes B-mode imaging, spectral Doppler, color Doppler, and power Doppler as needed of all accessible portions of each vessel. Bilateral testing is considered an integral part of a complete examination. Limited examinations for reoccurring indications may be performed as noted.  Right Carotid Findings: +----------+--------+--------+--------+--------+--------+           PSV cm/sEDV cm/sStenosisDescribeComments +----------+--------+--------+--------+--------+--------+ CCA Prox  87      18                               +----------+--------+--------+--------+--------+--------+ CCA Distal103     27                               +----------+--------+--------+--------+--------+--------+ ICA Prox  63      17      1-39%                    +----------+--------+--------+--------+--------+--------+ ICA Distal77      29                               +----------+--------+--------+--------+--------+--------+ ECA       103     17                               +----------+--------+--------+--------+--------+--------+ Portions of this table do not appear on this page. +----------+--------+-------+----------------+------------+           PSV cm/sEDV cmsDescribe        Arm Pressure +----------+--------+-------+----------------+------------+ Subclavian161            Multiphasic, WNL             +----------+--------+-------+----------------+------------+ +---------+--------+--+--------+--+---------+ VertebralPSV cm/s43EDV cm/s14Antegrade  +---------+--------+--+--------+--+---------+ Left Carotid Findings: +----------+--------+--------+--------+--------+--------+           PSV cm/sEDV cm/sStenosisDescribeComments +----------+--------+--------+--------+--------+--------+ CCA Prox  96      26                               +----------+--------+--------+--------+--------+--------+ CCA Distal78      19                               +----------+--------+--------+--------+--------+--------+ ICA Prox  50      20      1-39%                    +----------+--------+--------+--------+--------+--------+ ICA Distal73      31                               +----------+--------+--------+--------+--------+--------+ ECA       106     18                               +----------+--------+--------+--------+--------+--------+ +----------+--------+--------+----------------+------------+ SubclavianPSV cm/sEDV cm/sDescribe        Arm Pressure +----------+--------+--------+----------------+------------+           123             Multiphasic, WNL             +----------+--------+--------+----------------+------------+ +---------+--------+--+--------+--+---------+  VertebralPSV cm/s43EDV cm/s14Antegrade +---------+--------+--+--------+--+---------+  ABI Findings: +--------+------------------+-----+---------+--------+ Right   Rt Pressure (mmHg)IndexWaveform Comment  +--------+------------------+-----+---------+--------+ Brachial                       triphasic         +--------+------------------+-----+---------+--------+ +--------+------------------+-----+---------+-------+ Left    Lt Pressure (mmHg)IndexWaveform Comment +--------+------------------+-----+---------+-------+ Brachial                       triphasic        +--------+------------------+-----+---------+-------+  Right Doppler Findings: +--------+--------+-----+---------+--------+ Site    PressureIndexDoppler  Comments  +--------+--------+-----+---------+--------+ Brachial             triphasic         +--------+--------+-----+---------+--------+ Radial               triphasic         +--------+--------+-----+---------+--------+ Ulnar                triphasic         +--------+--------+-----+---------+--------+  Left Doppler Findings: +--------+--------+-----+---------+--------+ Site    PressureIndexDoppler  Comments +--------+--------+-----+---------+--------+ Brachial             triphasic         +--------+--------+-----+---------+--------+ Radial               triphasic         +--------+--------+-----+---------+--------+ Ulnar                triphasic         +--------+--------+-----+---------+--------+  Summary: Right Carotid: Velocities in the right ICA are consistent with a 1-39% stenosis. Left Carotid: Velocities in the left ICA are consistent with a 1-39% stenosis. Vertebrals:  Bilateral vertebral arteries demonstrate antegrade flow. Subclavians: Normal flow hemodynamics were seen in bilateral subclavian              arteries. Right Upper Extremity: Doppler waveforms remain within normal limits with right radial compression. Doppler waveforms remain within normal limits with right ulnar compression. Left Upper Extremity: Doppler waveforms decrease >50% with left radial compression. Doppler waveforms remain within normal limits with left ulnar compression.  Electronically signed by Coral ElseVance Brabham MD on 03/06/2021 at 6:17:16 PM.    Final      Scheduled Meds: . albuterol  2.5 mg Nebulization BID  . amLODipine  5 mg Oral Daily  . enoxaparin (LOVENOX) injection  80 mg Subcutaneous Q12H  . [START ON 03/08/2021] epinephrine  0-10 mcg/min Intravenous To OR  . fenofibrate  160 mg Oral Daily  . furosemide  40 mg Intravenous Daily  . guaiFENesin  600 mg Oral BID  . [START ON 03/08/2021] heparin-papaverine-plasmalyte irrigation   Irrigation To OR  . insulin aspart  0-5 Units  Subcutaneous QHS  . insulin aspart  0-6 Units Subcutaneous TID WC  . insulin detemir  5 Units Subcutaneous BID  . [START ON 03/08/2021] insulin   Intravenous To OR  . [START ON 03/08/2021] magnesium sulfate  40 mEq Other To OR  . metoprolol tartrate  25 mg Oral BID  . oxyCODONE  15 mg Oral Q6H  . pantoprazole  40 mg Oral Daily  . [START ON 03/08/2021] phenylephrine  30-200 mcg/min Intravenous To OR  . [START ON 03/08/2021] potassium chloride  80 mEq Other To OR  . potassium chloride  40 mEq Oral Daily  . rosuvastatin  10 mg Oral Daily  . sodium chloride flush  3 mL Intravenous Q12H  . sodium chloride  flush  3 mL Intravenous Q12H  . [START ON 03/08/2021] tranexamic acid  15 mg/kg Intravenous To OR  . [START ON 03/08/2021] tranexamic acid  2 mg/kg Intracatheter To OR  . traZODone  100 mg Oral QHS   Continuous Infusions: . sodium chloride    . sodium chloride    . [START ON 03/08/2021] cefUROXime (ZINACEF)  IV    . [START ON 03/08/2021] cefUROXime (ZINACEF)  IV    . [START ON 03/08/2021] dexmedetomidine    . [START ON 03/08/2021] heparin 30,000 units/NS 1000 mL solution for CELLSAVER    . [START ON 03/08/2021] milrinone    . [START ON 03/08/2021] nitroGLYCERIN    . [START ON 03/08/2021] norepinephrine    . [START ON 03/08/2021] tranexamic acid (CYKLOKAPRON) infusion ([facility])    . [START ON 03/08/2021] vancomycin       LOS: 6 days   Time Spent in minutes   45 minutes  Calen Geister D.O. on 03/07/2021 at 10:07 AM  Between 7am to 7pm - Please see pager noted on [email or web address]  After 7pm go to [email or web address]  And look for the night coverage person covering for me after hours  [facility] Office  [phone]

## 2021-03-07 NOTE — Plan of Care (Signed)

## 2021-03-07 NOTE — Progress Notes (Signed)
1254-1310 No questions re pre op ed. Has been walking to bathroom independently on oxygen. Encouraged IS. Pt going to use IS and look over pre op ed later. Resting now. Will follow up after surgery. Graylon Good RN BSN 03/07/2021 1:10 PM

## 2021-03-07 NOTE — Care Management Important Message (Signed)
Important Message  Patient Details  Name: Michael Valenzuela MRN: [id number] Date of Birth: 06-Mar-1956   Medicare Important Message Given:  Yes     Shelda Altes 03/07/2021, 8:55 AM

## 2021-03-07 NOTE — Progress Notes (Signed)
Progress Note  Patient Name: Michael Valenzuela Date of Encounter: 03/07/2021  [facility] Cardiologist: No primary care provider on file.   Subjective   Comfortable, no significant chest pain.  Shortness of breath improved.  Inpatient Medications    Scheduled Meds: . albuterol  2.5 mg Nebulization BID  . amLODipine  5 mg Oral Daily  . enoxaparin (LOVENOX) injection  80 mg Subcutaneous Q12H  . [START ON 03/08/2021] epinephrine  0-10 mcg/min Intravenous To OR  . fenofibrate  160 mg Oral Daily  . furosemide  40 mg Intravenous Daily  . guaiFENesin  600 mg Oral BID  . [START ON 03/08/2021] heparin-papaverine-plasmalyte irrigation   Irrigation To OR  . insulin aspart  0-5 Units Subcutaneous QHS  . insulin aspart  0-6 Units Subcutaneous TID WC  . insulin detemir  5 Units Subcutaneous BID  . [START ON 03/08/2021] insulin   Intravenous To OR  . [START ON 03/08/2021] magnesium sulfate  40 mEq Other To OR  . metoprolol tartrate  25 mg Oral BID  . oxyCODONE  15 mg Oral Q6H  . pantoprazole  40 mg Oral Daily  . [START ON 03/08/2021] phenylephrine  30-200 mcg/min Intravenous To OR  . [START ON 03/08/2021] potassium chloride  80 mEq Other To OR  . potassium chloride  40 mEq Oral Daily  . rosuvastatin  10 mg Oral Daily  . sodium chloride flush  3 mL Intravenous Q12H  . sodium chloride flush  3 mL Intravenous Q12H  . [START ON 03/08/2021] tranexamic acid  15 mg/kg Intravenous To OR  . [START ON 03/08/2021] tranexamic acid  2 mg/kg Intracatheter To OR  . traZODone  100 mg Oral QHS   Continuous Infusions: . sodium chloride    . sodium chloride    . [START ON 03/08/2021] cefUROXime (ZINACEF)  IV    . [START ON 03/08/2021] cefUROXime (ZINACEF)  IV    . [START ON 03/08/2021] dexmedetomidine    . [START ON 03/08/2021] heparin 30,000 units/NS 1000 mL solution for CELLSAVER    . [START ON 03/08/2021] milrinone    . [START ON 03/08/2021] nitroGLYCERIN    . [START ON 03/08/2021] norepinephrine    . [START ON  03/08/2021] tranexamic acid (CYKLOKAPRON) infusion ([facility])    . [START ON 03/08/2021] vancomycin     PRN Meds: sodium chloride, sodium chloride, acetaminophen, albuterol, cyclobenzaprine, ondansetron (ZOFRAN) IV, sodium chloride flush, sodium chloride flush   Vital Signs    Vitals:   03/07/21 0211 03/07/21 0424 03/07/21 0823 03/07/21 0913  BP:  99/74 121/90   Pulse:  64 65   Resp:  18 18   Temp:  97.9 F (36.6 C) 98 F (36.7 C)   TempSrc:  Oral Oral   SpO2:  96% 98% 96%  Weight: 85.5 kg     Height:        Intake/Output Summary (Last 24 hours) at 03/07/2021 1044 Last data filed at 03/07/2021 0912 Gross per 24 hour  Intake 703 ml  Output 1275 ml  Net -572 ml   Last 3 Weights 03/07/2021 03/06/2021 03/05/2021  Weight (lbs) 188 lb 9.6 oz 189 lb 9.6 oz 190 lb 14.4 oz  Weight (kg) 85.548 kg 86.002 kg 86.592 kg      Telemetry    SR, PVC - Personally Reviewed    Physical Exam   Currently in PFT lab  Labs    High Sensitivity Troponin:   Recent Labs  Lab 03/01/21 1343 03/01/21 1602  TROPONINIHS 11  [place]  Lab 03/01/21 1343 03/02/21 0512 03/05/21 0409 03/05/21 1327 03/05/21 1334 03/06/21 0644 03/07/21 0336  NA 136   < > 133*   < > 137  137 136 134*  K 3.9   < > 4.4   < > 4.5  4.5 4.5 4.0  CL 104   < > 103  --   --  104 101  CO2 22   < > 24  --   --  25 24  GLUCOSE 139*   < > 157*  --   --  150* 144*  BUN 24*   < > 41*  --   --  30* 29*  CREATININE 0.95   < > 1.07  --   --  1.02 0.90  CALCIUM 9.3   < > 9.1  --   --  8.9 8.7*  PROT 7.4  --   --   --   --   --   --   ALBUMIN 4.0  --   --   --   --   --   --   AST 26  --   --   --   --   --   --   ALT 20  --   --   --   --   --   --   ALKPHOS 48  --   --   --   --   --   --   BILITOT 0.7  --   --   --   --   --   --   GFRNONAA >60   < > >60  --   --  >60 >60  ANIONGAP 10   < > 6  --   --  7 9   < > = values in this interval not displayed.     Hematology Recent Labs  Lab  03/01/21 1343 03/05/21 0409 03/05/21 1327 03/05/21 1334 03/06/21 0644  WBC 10.0 15.4*  --   --  9.8  RBC 4.58 4.31  --   --  4.76  HGB 13.9 12.8* 12.6* 12.9*  13.3 14.1  HCT 41.2 38.2* 37.0* 38.0*  39.0 42.9  MCV 90.0 88.6  --   --  90.1  MCH 30.3 29.7  --   --  29.6  MCHC 33.7 33.5  --   --  32.9  RDW 12.5 12.6  --   --  12.4  PLT 375 340  --   --  351    BNP Recent Labs  Lab 03/01/21 1343  BNP 301.0*     DDimer  Recent Labs  Lab 03/01/21 1343  DDIMER 1.19*     Radiology    DG Orthopantogram  Result Date: 03/05/2021 CLINICAL DATA:  Severe mitral regurgitation.  No oral pain. EXAM: ORTHOPANTOGRAM/PANORAMIC COMPARISON:  None. FINDINGS: Previous extractions of the upper and lower third molars bilaterally, partial resection of the right upper second molar, and resection of the left lower second bicuspid. Dental caries demonstrated in the left upper second molar, the left lower first and second molars, and the right lower second molar. Reconstructions demonstrated in the right upper cuspid and first bicuspid, the left upper cuspid and first bicuspid, the right lower second bicuspid and first and second molars, and changes consistent with root canal in the left lower first molar. Minimal bone resorption suggested around the right lower second molar. No definite periapical lucencies. No acute  fracture or dislocation of the visualized mandible. IMPRESSION: Multiple dental caries and prior tooth extractions. No definite periapical lucencies. No acute fracture or dislocation of the mandible. Electronically Signed   By: Lucienne Capers M.D.   On: 03/05/2021 21:40   CARDIAC CATHETERIZATION  Result Date: 03/05/2021  The left ventricular systolic function is normal. The left ventricular ejection fraction is 55-65% by visual estimate.  LV end diastolic pressure is mildly elevated.  There is moderate (3+) mitral regurgitation.  Hemodynamic findings consistent with mild pulmonary  hypertension.  Angiographically normal coronary arteries  SUMMARY  Angiographically Normal Coronary Arteries  Mild Pulmonary Hypertension with mean PAP 30 mmHg, PCWP 5 mmHg with LVEDP 15 mmHg.  Mild-moderately reduced Cardiac Output-Cardiac Index (Fick) 4.44-2.12. RECOMMENDATIONS  Continue with evaluation for mitral valve surgery Glenetta Hew, MD  VAS US DOPPLER PRE CABG  Result Date: 03/06/2021 PREOPERATIVE VASCULAR EVALUATION Patient Name:  Michael Valenzuela  Date of Exam:   03/06/2021 Medical Rec #: [id number]   Accession #:    [id number] Date of Birth: Nov 29, 1955   Patient Gender: M Patient Age:   64Y Exam Location:  [facility] Procedure:      VAS US DOPPLER PRE CABG Referring Phys: [id number] [place] --------------------------------------------------------------------------------  Indications:      Pre-MVR. Risk Factors:     Hypertension, hyperlipidemia, Diabetes, past history of                   smoking. Comparison Study: No prior studies. Performing Technologist: Darlin Coco RDMS,RVT  Examination Guidelines: A complete evaluation includes B-mode imaging, spectral Doppler, color Doppler, and power Doppler as needed of all accessible portions of each vessel. Bilateral testing is considered an integral part of a complete examination. Limited examinations for reoccurring indications may be performed as noted.  Right Carotid Findings: +----------+--------+--------+--------+--------+--------+           PSV cm/sEDV cm/sStenosisDescribeComments +----------+--------+--------+--------+--------+--------+ CCA Prox  87      18                               +----------+--------+--------+--------+--------+--------+ CCA Distal103     27                               +----------+--------+--------+--------+--------+--------+ ICA Prox  63      17      1-39%                    +----------+--------+--------+--------+--------+--------+ ICA Distal77      29                                +----------+--------+--------+--------+--------+--------+ ECA       103     17                               +----------+--------+--------+--------+--------+--------+ Portions of this table do not appear on this page. +----------+--------+-------+----------------+------------+           PSV cm/sEDV cmsDescribe        Arm Pressure +----------+--------+-------+----------------+------------+ Subclavian161            Multiphasic, WNL             +----------+--------+-------+----------------+------------+ +---------+--------+--+--------+--+---------+ VertebralPSV cm/s43EDV cm/s14Antegrade +---------+--------+--+--------+--+---------+ Left Carotid Findings: +----------+--------+--------+--------+--------+--------+  PSV cm/sEDV cm/sStenosisDescribeComments +----------+--------+--------+--------+--------+--------+ CCA Prox  96      26                               +----------+--------+--------+--------+--------+--------+ CCA Distal78      19                               +----------+--------+--------+--------+--------+--------+ ICA Prox  50      20      1-39%                    +----------+--------+--------+--------+--------+--------+ ICA Distal73      31                               +----------+--------+--------+--------+--------+--------+ ECA       106     18                               +----------+--------+--------+--------+--------+--------+ +----------+--------+--------+----------------+------------+ SubclavianPSV cm/sEDV cm/sDescribe        Arm Pressure +----------+--------+--------+----------------+------------+           123             Multiphasic, WNL             +----------+--------+--------+----------------+------------+ +---------+--------+--+--------+--+---------+ VertebralPSV cm/s43EDV cm/s14Antegrade +---------+--------+--+--------+--+---------+  ABI Findings:  +--------+------------------+-----+---------+--------+ Right   Rt Pressure (mmHg)IndexWaveform Comment  +--------+------------------+-----+---------+--------+ Brachial                       triphasic         +--------+------------------+-----+---------+--------+ +--------+------------------+-----+---------+-------+ Left    Lt Pressure (mmHg)IndexWaveform Comment +--------+------------------+-----+---------+-------+ Brachial                       triphasic        +--------+------------------+-----+---------+-------+  Right Doppler Findings: +--------+--------+-----+---------+--------+ Site    PressureIndexDoppler  Comments +--------+--------+-----+---------+--------+ Brachial             triphasic         +--------+--------+-----+---------+--------+ Radial               triphasic         +--------+--------+-----+---------+--------+ Ulnar                triphasic         +--------+--------+-----+---------+--------+  Left Doppler Findings: +--------+--------+-----+---------+--------+ Site    PressureIndexDoppler  Comments +--------+--------+-----+---------+--------+ Brachial             triphasic         +--------+--------+-----+---------+--------+ Radial               triphasic         +--------+--------+-----+---------+--------+ Ulnar                triphasic         +--------+--------+-----+---------+--------+  Summary: Right Carotid: Velocities in the right ICA are consistent with a 1-39% stenosis. Left Carotid: Velocities in the left ICA are consistent with a 1-39% stenosis. Vertebrals:  Bilateral vertebral arteries demonstrate antegrade flow. Subclavians: Normal flow hemodynamics were seen in bilateral subclavian              arteries. Right Upper Extremity: Doppler waveforms remain within normal limits with right radial compression. Doppler waveforms remain within normal  limits with right ulnar compression. Left Upper Extremity: Doppler  waveforms decrease >50% with left radial compression. Doppler waveforms remain within normal limits with left ulnar compression.  Electronically signed by Harold Barban MD on 03/06/2021 at 6:17:16 PM.    Final     Cardiac Studies     1. Left ventricular ejection fraction, by estimation, is 60 to 65%. The  left ventricle has normal function.  2. Right ventricular systolic function is normal. The right ventricular  size is mildly enlarged.  3. Left atrial size was severely dilated. No left atrial/left atrial  appendage thrombus was detected.  4. Right atrial size was mildly dilated.  5. The aortic valve is tricuspid. Aortic valve regurgitation is not  visualized.  6. The mitral valve is abnormal. Flail P2. Severe mitral valve  regurgitation. MR severe by both 2D ERO and 3D VCA. Systolic flow reversal  in pulmonary veins, consistent with severe MR.   Patient Profile     65 y.o. male with severe mitral regurgitation, no significant coronary artery disease, pleural effusions  Assessment & Plan    Severe mitral vegetation - Dr. Orvan Seen has consulted, planned surgical repair.  Persistent atrial fibrillation - Newly diagnosed, on full dose Lovenox currently.  This will be need to held prior to surgery. - Maze procedure consideration.  Diabetes with hypertension - Stable  Pleural effusions - Improved with Lasix.  Discussed with primary team.    For questions or updates, please contact [place] Please consult [email or web address] for contact info under        Signed, Candee Furbish, MD  03/07/2021, 10:44 AM

## 2021-03-08 ENCOUNTER — Encounter (HOSPITAL_COMMUNITY)
Admission: EM | Disposition: A | Discharge status: Home / Self Care | Payer: Self-pay | Source: Home / Self Care | Attending: Cardiothoracic Surgery

## 2021-03-08 ENCOUNTER — Inpatient Hospital Stay (HOSPITAL_COMMUNITY): Payer: Medicare Other

## 2021-03-08 ENCOUNTER — Inpatient Hospital Stay (HOSPITAL_COMMUNITY): Payer: Medicare Other | Admitting: Anesthesiology

## 2021-03-08 DIAGNOSIS — Z952 Presence of prosthetic heart valve: Secondary | ICD-10-CM

## 2021-03-08 DIAGNOSIS — I4819 Other persistent atrial fibrillation: Secondary | ICD-10-CM | POA: Diagnosis not present

## 2021-03-08 DIAGNOSIS — I5033 Acute on chronic diastolic (congestive) heart failure: Secondary | ICD-10-CM | POA: Diagnosis not present

## 2021-03-08 DIAGNOSIS — Z006 Encounter for examination for normal comparison and control in clinical research program: Secondary | ICD-10-CM | POA: Diagnosis not present

## 2021-03-08 DIAGNOSIS — Z20822 Contact with and (suspected) exposure to covid-19: Secondary | ICD-10-CM | POA: Diagnosis not present

## 2021-03-08 DIAGNOSIS — I340 Nonrheumatic mitral (valve) insufficiency: Secondary | ICD-10-CM | POA: Diagnosis not present

## 2021-03-08 HISTORY — PX: MITRAL VALVE REPAIR: SHX2039

## 2021-03-08 HISTORY — PX: MAZE: SHX5063

## 2021-03-08 HISTORY — PX: TEE WITHOUT CARDIOVERSION: SHX5443

## 2021-03-08 LAB — POCT I-STAT, CHEM 8
BUN: 24 mg/dL — ABNORMAL HIGH (ref 8–23)
BUN: 23 mg/dL (ref 8–23)
BUN: 21 mg/dL (ref 8–23)
BUN: 23 mg/dL (ref 8–23)
BUN: 23 mg/dL (ref 8–23)
BUN: 22 mg/dL (ref 8–23)
BUN: 23 mg/dL (ref 8–23)
Calcium, Ion: 1.24 mmol/L (ref 1.15–1.40)
Calcium, Ion: 1.25 mmol/L (ref 1.15–1.40)
Calcium, Ion: 1.1 mmol/L — ABNORMAL LOW (ref 1.15–1.40)
Calcium, Ion: 1.13 mmol/L — ABNORMAL LOW (ref 1.15–1.40)
Calcium, Ion: 1.1 mmol/L — ABNORMAL LOW (ref 1.15–1.40)
Calcium, Ion: 1.09 mmol/L — ABNORMAL LOW (ref 1.15–1.40)
Calcium, Ion: 1.06 mmol/L — ABNORMAL LOW (ref 1.15–1.40)
Chloride: 101 mmol/L (ref 98–111)
Chloride: 103 mmol/L (ref 98–111)
Chloride: 103 mmol/L (ref 98–111)
Chloride: 102 mmol/L (ref 98–111)
Chloride: 105 mmol/L (ref 98–111)
Chloride: 105 mmol/L (ref 98–111)
Chloride: 106 mmol/L (ref 98–111)
Creatinine, Ser: 0.8 mg/dL (ref 0.61–1.24)
Creatinine, Ser: 0.8 mg/dL (ref 0.61–1.24)
Creatinine, Ser: 0.8 mg/dL (ref 0.61–1.24)
Creatinine, Ser: 0.7 mg/dL (ref 0.61–1.24)
Creatinine, Ser: 0.7 mg/dL (ref 0.61–1.24)
Creatinine, Ser: 0.6 mg/dL — ABNORMAL LOW (ref 0.61–1.24)
Creatinine, Ser: 0.7 mg/dL (ref 0.61–1.24)
Glucose, Bld: 153 mg/dL — ABNORMAL HIGH (ref 70–99)
Glucose, Bld: 141 mg/dL — ABNORMAL HIGH (ref 70–99)
Glucose, Bld: 133 mg/dL — ABNORMAL HIGH (ref 70–99)
Glucose, Bld: 137 mg/dL — ABNORMAL HIGH (ref 70–99)
Glucose, Bld: 132 mg/dL — ABNORMAL HIGH (ref 70–99)
Glucose, Bld: 128 mg/dL — ABNORMAL HIGH (ref 70–99)
Glucose, Bld: 123 mg/dL — ABNORMAL HIGH (ref 70–99)
HCT: 35 % — ABNORMAL LOW (ref 39.0–52.0)
HCT: 33 % — ABNORMAL LOW (ref 39.0–52.0)
HCT: 26 % — ABNORMAL LOW (ref 39.0–52.0)
HCT: 28 % — ABNORMAL LOW (ref 39.0–52.0)
HCT: 26 % — ABNORMAL LOW (ref 39.0–52.0)
HCT: 24 % — ABNORMAL LOW (ref 39.0–52.0)
HCT: 25 % — ABNORMAL LOW (ref 39.0–52.0)
Hemoglobin: 11.9 g/dL — ABNORMAL LOW (ref 13.0–17.0)
Hemoglobin: 11.2 g/dL — ABNORMAL LOW (ref 13.0–17.0)
Hemoglobin: 8.8 g/dL — ABNORMAL LOW (ref 13.0–17.0)
Hemoglobin: 9.5 g/dL — ABNORMAL LOW (ref 13.0–17.0)
Hemoglobin: 8.8 g/dL — ABNORMAL LOW (ref 13.0–17.0)
Hemoglobin: 8.2 g/dL — ABNORMAL LOW (ref 13.0–17.0)
Hemoglobin: 8.5 g/dL — ABNORMAL LOW (ref 13.0–17.0)
Potassium: 4.2 mmol/L (ref 3.5–5.1)
Potassium: 4.1 mmol/L (ref 3.5–5.1)
Potassium: 4.4 mmol/L (ref 3.5–5.1)
Potassium: 4.1 mmol/L (ref 3.5–5.1)
Potassium: 4.2 mmol/L (ref 3.5–5.1)
Potassium: 4.4 mmol/L (ref 3.5–5.1)
Potassium: 4.1 mmol/L (ref 3.5–5.1)
Sodium: 137 mmol/L (ref 135–145)
Sodium: 136 mmol/L (ref 135–145)
Sodium: 137 mmol/L (ref 135–145)
Sodium: 138 mmol/L (ref 135–145)
Sodium: 137 mmol/L (ref 135–145)
Sodium: 138 mmol/L (ref 135–145)
Sodium: 139 mmol/L (ref 135–145)
TCO2: 26 mmol/L (ref 22–32)
TCO2: 24 mmol/L (ref 22–32)
TCO2: 28 mmol/L (ref 22–32)
TCO2: 26 mmol/L (ref 22–32)
TCO2: 26 mmol/L (ref 22–32)
TCO2: 27 mmol/L (ref 22–32)
TCO2: 23 mmol/L (ref 22–32)

## 2021-03-08 LAB — POCT I-STAT 7, (LYTES, BLD GAS, ICA,H+H)
Acid-Base Excess: 0 mmol/L (ref 0.0–2.0)
Acid-Base Excess: 2 mmol/L (ref 0.0–2.0)
Acid-Base Excess: 2 mmol/L (ref 0.0–2.0)
Acid-Base Excess: 2 mmol/L (ref 0.0–2.0)
Acid-Base Excess: 1 mmol/L (ref 0.0–2.0)
Acid-base deficit: 4 mmol/L — ABNORMAL HIGH (ref 0.0–2.0)
Acid-base deficit: 5 mmol/L — ABNORMAL HIGH (ref 0.0–2.0)
Acid-base deficit: 1 mmol/L (ref 0.0–2.0)
Acid-base deficit: 2 mmol/L (ref 0.0–2.0)
Bicarbonate: 26.2 mmol/L (ref 20.0–28.0)
Bicarbonate: 27.7 mmol/L (ref 20.0–28.0)
Bicarbonate: 27.4 mmol/L (ref 20.0–28.0)
Bicarbonate: 27.3 mmol/L (ref 20.0–28.0)
Bicarbonate: 25 mmol/L (ref 20.0–28.0)
Bicarbonate: 22.4 mmol/L (ref 20.0–28.0)
Bicarbonate: 21.8 mmol/L (ref 20.0–28.0)
Bicarbonate: 23.4 mmol/L (ref 20.0–28.0)
Bicarbonate: 22.8 mmol/L (ref 20.0–28.0)
Calcium, Ion: 1.24 mmol/L (ref 1.15–1.40)
Calcium, Ion: 1.07 mmol/L — ABNORMAL LOW (ref 1.15–1.40)
Calcium, Ion: 1.1 mmol/L — ABNORMAL LOW (ref 1.15–1.40)
Calcium, Ion: 1.1 mmol/L — ABNORMAL LOW (ref 1.15–1.40)
Calcium, Ion: 1.1 mmol/L — ABNORMAL LOW (ref 1.15–1.40)
Calcium, Ion: 1.06 mmol/L — ABNORMAL LOW (ref 1.15–1.40)
Calcium, Ion: 1.08 mmol/L — ABNORMAL LOW (ref 1.15–1.40)
Calcium, Ion: 1.01 mmol/L — ABNORMAL LOW (ref 1.15–1.40)
Calcium, Ion: 1.04 mmol/L — ABNORMAL LOW (ref 1.15–1.40)
HCT: 37 % — ABNORMAL LOW (ref 39.0–52.0)
HCT: 28 % — ABNORMAL LOW (ref 39.0–52.0)
HCT: 28 % — ABNORMAL LOW (ref 39.0–52.0)
HCT: 26 % — ABNORMAL LOW (ref 39.0–52.0)
HCT: 26 % — ABNORMAL LOW (ref 39.0–52.0)
HCT: 27 % — ABNORMAL LOW (ref 39.0–52.0)
HCT: 33 % — ABNORMAL LOW (ref 39.0–52.0)
HCT: 31 % — ABNORMAL LOW (ref 39.0–52.0)
HCT: 30 % — ABNORMAL LOW (ref 39.0–52.0)
Hemoglobin: 12.6 g/dL — ABNORMAL LOW (ref 13.0–17.0)
Hemoglobin: 9.5 g/dL — ABNORMAL LOW (ref 13.0–17.0)
Hemoglobin: 9.5 g/dL — ABNORMAL LOW (ref 13.0–17.0)
Hemoglobin: 8.8 g/dL — ABNORMAL LOW (ref 13.0–17.0)
Hemoglobin: 8.8 g/dL — ABNORMAL LOW (ref 13.0–17.0)
Hemoglobin: 9.2 g/dL — ABNORMAL LOW (ref 13.0–17.0)
Hemoglobin: 11.2 g/dL — ABNORMAL LOW (ref 13.0–17.0)
Hemoglobin: 10.5 g/dL — ABNORMAL LOW (ref 13.0–17.0)
Hemoglobin: 10.2 g/dL — ABNORMAL LOW (ref 13.0–17.0)
O2 Saturation: 100 %
O2 Saturation: 100 %
O2 Saturation: 100 %
O2 Saturation: 100 %
O2 Saturation: 100 %
O2 Saturation: 97 %
O2 Saturation: 86 %
O2 Saturation: 92 %
O2 Saturation: 97 %
Patient temperature: 36.3
Patient temperature: 36.5
Patient temperature: 36.5
Potassium: 4.2 mmol/L (ref 3.5–5.1)
Potassium: 3.8 mmol/L (ref 3.5–5.1)
Potassium: 4.1 mmol/L (ref 3.5–5.1)
Potassium: 4.2 mmol/L (ref 3.5–5.1)
Potassium: 4.5 mmol/L (ref 3.5–5.1)
Potassium: 4.1 mmol/L (ref 3.5–5.1)
Potassium: 4.7 mmol/L (ref 3.5–5.1)
Potassium: 5.1 mmol/L (ref 3.5–5.1)
Potassium: 3.9 mmol/L (ref 3.5–5.1)
Sodium: 136 mmol/L (ref 135–145)
Sodium: 138 mmol/L (ref 135–145)
Sodium: 137 mmol/L (ref 135–145)
Sodium: 138 mmol/L (ref 135–145)
Sodium: 139 mmol/L (ref 135–145)
Sodium: 140 mmol/L (ref 135–145)
Sodium: 140 mmol/L (ref 135–145)
Sodium: 141 mmol/L (ref 135–145)
Sodium: 143 mmol/L (ref 135–145)
TCO2: 28 mmol/L (ref 22–32)
TCO2: 29 mmol/L (ref 22–32)
TCO2: 29 mmol/L (ref 22–32)
TCO2: 29 mmol/L (ref 22–32)
TCO2: 26 mmol/L (ref 22–32)
TCO2: 24 mmol/L (ref 22–32)
TCO2: 23 mmol/L (ref 22–32)
TCO2: 24 mmol/L (ref 22–32)
TCO2: 24 mmol/L (ref 22–32)
pCO2 arterial: 46.8 mmHg (ref 32.0–48.0)
pCO2 arterial: 50.8 mmHg — ABNORMAL HIGH (ref 32.0–48.0)
pCO2 arterial: 43.9 mmHg (ref 32.0–48.0)
pCO2 arterial: 43.7 mmHg (ref 32.0–48.0)
pCO2 arterial: 37.8 mmHg (ref 32.0–48.0)
pCO2 arterial: 44.8 mmHg (ref 32.0–48.0)
pCO2 arterial: 45.1 mmHg (ref 32.0–48.0)
pCO2 arterial: 34.6 mmHg (ref 32.0–48.0)
pCO2 arterial: 39 mmHg (ref 32.0–48.0)
pH, Arterial: 7.355 (ref 7.350–7.450)
pH, Arterial: 7.345 — ABNORMAL LOW (ref 7.350–7.450)
pH, Arterial: 7.403 (ref 7.350–7.450)
pH, Arterial: 7.404 (ref 7.350–7.450)
pH, Arterial: 7.428 (ref 7.350–7.450)
pH, Arterial: 7.307 — ABNORMAL LOW (ref 7.350–7.450)
pH, Arterial: 7.288 — ABNORMAL LOW (ref 7.350–7.450)
pH, Arterial: 7.435 (ref 7.350–7.450)
pH, Arterial: 7.373 (ref 7.350–7.450)
pO2, Arterial: 397 mmHg — ABNORMAL HIGH (ref 83.0–108.0)
pO2, Arterial: 337 mmHg — ABNORMAL HIGH (ref 83.0–108.0)
pO2, Arterial: 354 mmHg — ABNORMAL HIGH (ref 83.0–108.0)
pO2, Arterial: 383 mmHg — ABNORMAL HIGH (ref 83.0–108.0)
pO2, Arterial: 337 mmHg — ABNORMAL HIGH (ref 83.0–108.0)
pO2, Arterial: 100 mmHg (ref 83.0–108.0)
pO2, Arterial: 55 mmHg — ABNORMAL LOW (ref 83.0–108.0)
pO2, Arterial: 61 mmHg — ABNORMAL LOW (ref 83.0–108.0)
pO2, Arterial: 96 mmHg (ref 83.0–108.0)

## 2021-03-08 LAB — BASIC METABOLIC PANEL
Anion gap: 7 (ref 5–15)
BUN: 24 mg/dL — ABNORMAL HIGH (ref 8–23)
CO2: 20 mmol/L — ABNORMAL LOW (ref 22–32)
Calcium: 7 mg/dL — ABNORMAL LOW (ref 8.9–10.3)
Chloride: 107 mmol/L (ref 98–111)
Creatinine, Ser: 1.1 mg/dL (ref 0.61–1.24)
GFR, Estimated: >60 mL/min (ref >60)
Glucose, Bld: 134 mg/dL — ABNORMAL HIGH (ref 70–99)
Potassium: 4 mmol/L (ref 3.5–5.1)
Sodium: 134 mmol/L — ABNORMAL LOW (ref 135–145)

## 2021-03-08 LAB — CBC
HCT: 40.2 % (ref 39.0–52.0)
HCT: 34 % — ABNORMAL LOW (ref 39.0–52.0)
HCT: 30.7 % — ABNORMAL LOW (ref 39.0–52.0)
Hemoglobin: 13.4 g/dL (ref 13.0–17.0)
Hemoglobin: 11.4 g/dL — ABNORMAL LOW (ref 13.0–17.0)
Hemoglobin: 10.4 g/dL — ABNORMAL LOW (ref 13.0–17.0)
MCH: 29.7 pg (ref 26.0–34.0)
MCH: 30.1 pg (ref 26.0–34.0)
MCH: 30.1 pg (ref 26.0–34.0)
MCHC: 33.3 g/dL (ref 30.0–36.0)
MCHC: 33.5 g/dL (ref 30.0–36.0)
MCHC: 33.9 g/dL (ref 30.0–36.0)
MCV: 89.1 fL (ref 80.0–100.0)
MCV: 89.7 fL (ref 80.0–100.0)
MCV: 88.7 fL (ref 80.0–100.0)
Platelets: 313 10*3/uL (ref 150–400)
Platelets: 210 10*3/uL (ref 150–400)
Platelets: 225 10*3/uL (ref 150–400)
RBC: 4.51 MIL/uL (ref 4.22–5.81)
RBC: 3.79 MIL/uL — ABNORMAL LOW (ref 4.22–5.81)
RBC: 3.46 MIL/uL — ABNORMAL LOW (ref 4.22–5.81)
RDW: 12.5 % (ref 11.5–15.5)
RDW: 12.4 % (ref 11.5–15.5)
RDW: 12.6 % (ref 11.5–15.5)
WBC: 12.7 10*3/uL — ABNORMAL HIGH (ref 4.0–10.5)
WBC: 23.3 10*3/uL — ABNORMAL HIGH (ref 4.0–10.5)
WBC: 22.9 10*3/uL — ABNORMAL HIGH (ref 4.0–10.5)
nRBC: 0 % (ref 0.0–0.2)
nRBC: 0 % (ref 0.0–0.2)
nRBC: 0 % (ref 0.0–0.2)

## 2021-03-08 LAB — GLUCOSE, CAPILLARY
Glucose-Capillary: 173 mg/dL — ABNORMAL HIGH (ref 70–99)
Glucose-Capillary: 133 mg/dL — ABNORMAL HIGH (ref 70–99)
Glucose-Capillary: 110 mg/dL — ABNORMAL HIGH (ref 70–99)
Glucose-Capillary: 127 mg/dL — ABNORMAL HIGH (ref 70–99)
Glucose-Capillary: 157 mg/dL — ABNORMAL HIGH (ref 70–99)
Glucose-Capillary: 145 mg/dL — ABNORMAL HIGH (ref 70–99)

## 2021-03-08 LAB — COMPREHENSIVE METABOLIC PANEL
ALT: 21 U/L (ref 0–44)
AST: 22 U/L (ref 15–41)
Albumin: 3.3 g/dL — ABNORMAL LOW (ref 3.5–5.0)
Alkaline Phosphatase: 38 U/L (ref 38–126)
Anion gap: 9 (ref 5–15)
BUN: 23 mg/dL (ref 8–23)
CO2: 24 mmol/L (ref 22–32)
Calcium: 9.1 mg/dL (ref 8.9–10.3)
Chloride: 101 mmol/L (ref 98–111)
Creatinine, Ser: 0.97 mg/dL (ref 0.61–1.24)
GFR, Estimated: >60 mL/min (ref >60)
Glucose, Bld: 152 mg/dL — ABNORMAL HIGH (ref 70–99)
Potassium: 4.3 mmol/L (ref 3.5–5.1)
Sodium: 134 mmol/L — ABNORMAL LOW (ref 135–145)
Total Bilirubin: 0.9 mg/dL (ref 0.3–1.2)
Total Protein: 6.2 g/dL — ABNORMAL LOW (ref 6.5–8.1)

## 2021-03-08 LAB — POCT I-STAT EG7
Acid-Base Excess: 0 mmol/L (ref 0.0–2.0)
Bicarbonate: 26.3 mmol/L (ref 20.0–28.0)
Calcium, Ion: 1.11 mmol/L — ABNORMAL LOW (ref 1.15–1.40)
HCT: 28 % — ABNORMAL LOW (ref 39.0–52.0)
Hemoglobin: 9.5 g/dL — ABNORMAL LOW (ref 13.0–17.0)
O2 Saturation: 82 %
Potassium: 3.7 mmol/L (ref 3.5–5.1)
Sodium: 139 mmol/L (ref 135–145)
TCO2: 28 mmol/L (ref 22–32)
pCO2, Ven: 49.5 mmHg (ref 44.0–60.0)
pH, Ven: 7.333 (ref 7.250–7.430)
pO2, Ven: 50 mmHg — ABNORMAL HIGH (ref 32.0–45.0)

## 2021-03-08 LAB — COOXEMETRY PANEL
Carboxyhemoglobin: 0.9 % (ref 0.5–1.5)
Methemoglobin: 1.4 % (ref 0.0–1.5)
O2 Saturation: 53.6 %
Total hemoglobin: 10.8 g/dL — ABNORMAL LOW (ref 12.0–16.0)

## 2021-03-08 LAB — HEMOGLOBIN AND HEMATOCRIT, BLOOD
HCT: 25.9 % — ABNORMAL LOW (ref 39.0–52.0)
Hemoglobin: 8.8 g/dL — ABNORMAL LOW (ref 13.0–17.0)

## 2021-03-08 LAB — MAGNESIUM: Magnesium: 3.5 mg/dL — ABNORMAL HIGH (ref 1.7–2.4)

## 2021-03-08 LAB — PROTIME-INR
INR: 1.6 — ABNORMAL HIGH (ref 0.8–1.2)
Prothrombin Time: 18.8 seconds — ABNORMAL HIGH (ref 11.4–15.2)

## 2021-03-08 LAB — FIBRINOGEN: Fibrinogen: 270 mg/dL (ref 210–475)

## 2021-03-08 LAB — APTT: aPTT: 40 seconds — ABNORMAL HIGH (ref 24–36)

## 2021-03-08 LAB — PLATELET COUNT: Platelets: 207 10*3/uL (ref 150–400)

## 2021-03-08 SURGERY — REPAIR, MITRAL VALVE
Anesthesia: General

## 2021-03-08 MED ORDER — LACTATED RINGERS IV SOLN: INTRAVENOUS | Status: DC | PRN

## 2021-03-08 MED ORDER — SODIUM CHLORIDE (PF) 0.9 % IJ SOLN
INTRAMUSCULAR | Status: AC
  Filled 2021-03-08: qty 20

## 2021-03-08 MED ORDER — MIDAZOLAM HCL (PF) 5 MG/ML IJ SOLN
INTRAMUSCULAR | Status: DC | PRN
  Administered 2021-03-08: 1 mg via INTRAVENOUS
  Administered 2021-03-08: 5 mg via INTRAVENOUS
  Administered 2021-03-08: 4 mg via INTRAVENOUS

## 2021-03-08 MED ORDER — FENTANYL CITRATE (PF) 250 MCG/5ML IJ SOLN
INTRAMUSCULAR | Status: AC
  Filled 2021-03-08: qty 30

## 2021-03-08 MED ORDER — LEVALBUTEROL HCL 0.63 MG/3ML IN NEBU
0.6300 mg | INHALATION_SOLUTION | Freq: Four times a day (QID) | RESPIRATORY_TRACT | Status: DC
  Administered 2021-03-08 – 2021-03-09 (×4): 0.63 mg via RESPIRATORY_TRACT
  Filled 2021-03-08 (×4): qty 3

## 2021-03-08 MED ORDER — BISACODYL 10 MG RE SUPP
10.0000 mg | Freq: Every day | RECTAL | Status: DC
  Administered 2021-03-09: 10 mg via RECTAL
  Filled 2021-03-08: qty 1

## 2021-03-08 MED ORDER — ALBUMIN HUMAN 5 % IV SOLN: INTRAVENOUS | Status: DC | PRN

## 2021-03-08 MED ORDER — SODIUM BICARBONATE 4.2 % IV SOLN: 100.0000 meq | Freq: Once | INTRAVENOUS | Status: DC

## 2021-03-08 MED ORDER — ROCURONIUM BROMIDE 10 MG/ML (PF) SYRINGE
PREFILLED_SYRINGE | INTRAVENOUS | Status: AC
  Filled 2021-03-08: qty 10

## 2021-03-08 MED ORDER — SODIUM CHLORIDE 0.9% FLUSH: 10.0000 mL | INTRAVENOUS | Status: DC | PRN

## 2021-03-08 MED ORDER — VANCOMYCIN HCL 1000 MG IV SOLR
INTRAVENOUS | Status: DC | PRN
  Administered 2021-03-08: 3000 mg

## 2021-03-08 MED ORDER — CHLORHEXIDINE GLUCONATE 0.12 % MT SOLN
15.0000 mL | OROMUCOSAL | Status: AC
  Administered 2021-03-08: 15 mL via OROMUCOSAL

## 2021-03-08 MED ORDER — ACETAMINOPHEN 160 MG/5ML PO SOLN: 650.0000 mg | Freq: Once | ORAL | Status: AC

## 2021-03-08 MED ORDER — ACETAMINOPHEN 160 MG/5ML PO SOLN: 1000.0000 mg | Freq: Four times a day (QID) | ORAL | Status: AC

## 2021-03-08 MED ORDER — SODIUM BICARBONATE 8.4 % IV SOLN
100.0000 meq | Freq: Once | INTRAVENOUS | Status: AC
  Administered 2021-03-08: 100 meq via INTRAVENOUS

## 2021-03-08 MED ORDER — POTASSIUM CHLORIDE 10 MEQ/50ML IV SOLN: 10.0000 meq | INTRAVENOUS | Status: AC

## 2021-03-08 MED ORDER — LACTATED RINGERS IV SOLN: INTRAVENOUS | Status: DC

## 2021-03-08 MED ORDER — PLASMA-LYTE 148 IV SOLN
INTRAVENOUS | Status: DC | PRN
  Administered 2021-03-08: 500 mL via INTRAVASCULAR

## 2021-03-08 MED ORDER — PLATELET RICH PLASMA OPTIME
Status: DC | PRN
  Administered 2021-03-08: 10 mL

## 2021-03-08 MED ORDER — ACETAMINOPHEN 650 MG RE SUPP
650.0000 mg | Freq: Once | RECTAL | Status: AC
  Administered 2021-03-08: 650 mg via RECTAL

## 2021-03-08 MED ORDER — VANCOMYCIN HCL 1000 MG IV SOLR
INTRAVENOUS | Status: AC
  Filled 2021-03-08: qty 3000

## 2021-03-08 MED ORDER — MAGNESIUM SULFATE 4 GM/100ML IV SOLN
4.0000 g | Freq: Once | INTRAVENOUS | Status: AC
  Administered 2021-03-08: 4 g via INTRAVENOUS
  Filled 2021-03-08: qty 100

## 2021-03-08 MED ORDER — NITROGLYCERIN IN D5W 200-5 MCG/ML-% IV SOLN: 0.0000 ug/min | INTRAVENOUS | Status: DC

## 2021-03-08 MED ORDER — BISACODYL 5 MG PO TBEC
10.0000 mg | DELAYED_RELEASE_TABLET | Freq: Every day | ORAL | Status: DC
  Administered 2021-03-10 – 2021-03-14 (×4): 10 mg via ORAL
  Filled 2021-03-08 (×6): qty 2

## 2021-03-08 MED ORDER — PHENYLEPHRINE 40 MCG/ML (10ML) SYRINGE FOR IV PUSH (FOR BLOOD PRESSURE SUPPORT)
PREFILLED_SYRINGE | INTRAVENOUS | Status: DC | PRN
  Administered 2021-03-08: 200 ug via INTRAVENOUS

## 2021-03-08 MED ORDER — ASPIRIN 81 MG PO CHEW: 324.0000 mg | CHEWABLE_TABLET | Freq: Every day | ORAL | Status: DC

## 2021-03-08 MED ORDER — THROMBIN 5000 UNITS EX SOLR
CUTANEOUS | Status: DC | PRN
  Administered 2021-03-08: 2 mL

## 2021-03-08 MED ORDER — OXYCODONE HCL 5 MG PO TABS
5.0000 mg | ORAL_TABLET | ORAL | Status: DC | PRN
  Administered 2021-03-10 – 2021-03-11 (×4): 10 mg via ORAL
  Administered 2021-03-11 – 2021-03-12 (×2): 5 mg via ORAL
  Administered 2021-03-13 – 2021-03-15 (×9): 10 mg via ORAL
  Filled 2021-03-08 (×4): qty 2
  Filled 2021-03-08: qty 1
  Filled 2021-03-08 (×6): qty 2
  Filled 2021-03-08: qty 1
  Filled 2021-03-08 (×4): qty 2

## 2021-03-08 MED ORDER — SODIUM CHLORIDE 0.9 % IV SOLN: INTRAVENOUS | Status: DC | PRN

## 2021-03-08 MED ORDER — PROTAMINE SULFATE 10 MG/ML IV SOLN
INTRAVENOUS | Status: DC | PRN
  Administered 2021-03-08: 300 mg via INTRAVENOUS

## 2021-03-08 MED ORDER — BUPIVACAINE HCL (PF) 0.5 % IJ SOLN
INTRAMUSCULAR | Status: AC
  Filled 2021-03-08: qty 30

## 2021-03-08 MED ORDER — SODIUM CHLORIDE 0.9 % IV SOLN
1.5000 g | Freq: Two times a day (BID) | INTRAVENOUS | Status: AC
  Administered 2021-03-08 – 2021-03-10 (×4): 1.5 g via INTRAVENOUS
  Filled 2021-03-08 (×4): qty 1.5

## 2021-03-08 MED ORDER — ARTIFICIAL TEARS OPHTHALMIC OINT
TOPICAL_OINTMENT | OPHTHALMIC | Status: DC | PRN
  Administered 2021-03-08: 1 via OPHTHALMIC

## 2021-03-08 MED ORDER — DEXMEDETOMIDINE HCL IN NACL 400 MCG/100ML IV SOLN
0.1000 ug/kg/h | INTRAVENOUS | Status: DC
  Filled 2021-03-08: qty 100

## 2021-03-08 MED ORDER — PLATELET POOR PLASMA OPTIME
Status: DC | PRN
  Administered 2021-03-08: 10 mL

## 2021-03-08 MED ORDER — EPINEPHRINE HCL 5 MG/250ML IV SOLN IN NS
0.0000 ug/min | INTRAVENOUS | Status: DC
Start: 2021-03-08 — End: 2021-03-10
  Administered 2021-03-09: 4 ug/min via INTRAVENOUS
  Filled 2021-03-08: qty 250

## 2021-03-08 MED ORDER — CHLORHEXIDINE GLUCONATE 0.12% ORAL RINSE (MEDLINE KIT)
15.0000 mL | Freq: Two times a day (BID) | OROMUCOSAL | Status: DC
  Administered 2021-03-08 – 2021-03-09 (×3): 15 mL via OROMUCOSAL

## 2021-03-08 MED ORDER — HEMOSTATIC AGENTS (NO CHARGE) OPTIME
TOPICAL | Status: DC | PRN
  Administered 2021-03-08 (×2): 1 via TOPICAL

## 2021-03-08 MED ORDER — LIDOCAINE 2% (20 MG/ML) 5 ML SYRINGE
INTRAMUSCULAR | Status: AC
  Filled 2021-03-08: qty 5

## 2021-03-08 MED ORDER — FENTANYL CITRATE (PF) 250 MCG/5ML IJ SOLN
INTRAMUSCULAR | Status: DC | PRN
  Administered 2021-03-08: 100 ug via INTRAVENOUS
  Administered 2021-03-08: 200 ug via INTRAVENOUS
  Administered 2021-03-08: 150 ug via INTRAVENOUS
  Administered 2021-03-08: 50 ug via INTRAVENOUS

## 2021-03-08 MED ORDER — HEMOSTATIC AGENTS (NO CHARGE) OPTIME
TOPICAL | Status: DC | PRN
  Administered 2021-03-08: 1 via TOPICAL

## 2021-03-08 MED ORDER — BUPIVACAINE LIPOSOME 1.3 % IJ SUSP
INTRAMUSCULAR | Status: DC | PRN
  Administered 2021-03-08: 50 mL

## 2021-03-08 MED ORDER — SODIUM CHLORIDE 0.9 % IV SOLN: INTRAVENOUS | Status: DC

## 2021-03-08 MED ORDER — METOPROLOL TARTRATE 5 MG/5ML IV SOLN: 2.5000 mg | INTRAVENOUS | Status: DC | PRN

## 2021-03-08 MED ORDER — FAMOTIDINE IN NACL 20-0.9 MG/50ML-% IV SOLN
20.0000 mg | Freq: Two times a day (BID) | INTRAVENOUS | Status: AC
  Administered 2021-03-08 (×2): 20 mg via INTRAVENOUS
  Filled 2021-03-08 (×3): qty 50

## 2021-03-08 MED ORDER — ROSUVASTATIN CALCIUM 5 MG PO TABS
10.0000 mg | ORAL_TABLET | Freq: Every day | ORAL | Status: DC
  Administered 2021-03-10 – 2021-03-15 (×6): 10 mg via ORAL
  Filled 2021-03-08 (×6): qty 2

## 2021-03-08 MED ORDER — EPHEDRINE 5 MG/ML INJ
INTRAVENOUS | Status: AC
  Filled 2021-03-08: qty 10

## 2021-03-08 MED ORDER — TRAMADOL HCL 50 MG PO TABS
50.0000 mg | ORAL_TABLET | ORAL | Status: DC | PRN
Start: 2021-03-08 — End: 2021-03-15
  Administered 2021-03-10 (×2): 100 mg via ORAL
  Administered 2021-03-10: 50 mg via ORAL
  Administered 2021-03-12 – 2021-03-15 (×6): 100 mg via ORAL
  Filled 2021-03-08: qty 1
  Filled 2021-03-08 (×8): qty 2

## 2021-03-08 MED ORDER — SODIUM CHLORIDE (PF) 0.9 % IJ SOLN
OROMUCOSAL | Status: DC | PRN
  Administered 2021-03-08 (×2): 4 mL via TOPICAL

## 2021-03-08 MED ORDER — LEVALBUTEROL TARTRATE 45 MCG/ACT IN AERO: 2.0000 | INHALATION_SPRAY | Freq: Four times a day (QID) | RESPIRATORY_TRACT | Status: DC

## 2021-03-08 MED ORDER — DEXMEDETOMIDINE HCL IN NACL 400 MCG/100ML IV SOLN
0.0000 ug/kg/h | INTRAVENOUS | Status: DC
  Administered 2021-03-08 – 2021-03-09 (×5): 0.7 ug/kg/h via INTRAVENOUS
  Filled 2021-03-08 (×5): qty 100

## 2021-03-08 MED ORDER — BUPIVACAINE LIPOSOME 1.3 % IJ SUSP
INTRAMUSCULAR | Status: AC
  Filled 2021-03-08: qty 20

## 2021-03-08 MED ORDER — MIDAZOLAM HCL (PF) 10 MG/2ML IJ SOLN
INTRAMUSCULAR | Status: AC
  Filled 2021-03-08: qty 2

## 2021-03-08 MED ORDER — VANCOMYCIN HCL IN DEXTROSE 1-5 GM/200ML-% IV SOLN
1000.0000 mg | Freq: Once | INTRAVENOUS | Status: AC
  Administered 2021-03-08: 1000 mg via INTRAVENOUS
  Filled 2021-03-08 (×2): qty 200

## 2021-03-08 MED ORDER — ASPIRIN EC 325 MG PO TBEC
325.0000 mg | DELAYED_RELEASE_TABLET | Freq: Every day | ORAL | Status: DC
  Administered 2021-03-10: 325 mg via ORAL
  Filled 2021-03-08: qty 1

## 2021-03-08 MED ORDER — SODIUM CHLORIDE 0.9% FLUSH
10.0000 mL | Freq: Two times a day (BID) | INTRAVENOUS | Status: DC
Start: 2021-03-08 — End: 2021-03-11
  Administered 2021-03-08: 20 mL
  Administered 2021-03-09 – 2021-03-10 (×4): 10 mL

## 2021-03-08 MED ORDER — EPHEDRINE SULFATE-NACL 50-0.9 MG/10ML-% IV SOSY
PREFILLED_SYRINGE | INTRAVENOUS | Status: DC | PRN
  Administered 2021-03-08: 15 mg via INTRAVENOUS
  Administered 2021-03-08 (×4): 10 mg via INTRAVENOUS

## 2021-03-08 MED ORDER — PLASMA-LYTE A IV SOLN: INTRAVENOUS | Status: DC

## 2021-03-08 MED ORDER — PHENYLEPHRINE 40 MCG/ML (10ML) SYRINGE FOR IV PUSH (FOR BLOOD PRESSURE SUPPORT)
PREFILLED_SYRINGE | INTRAVENOUS | Status: AC
  Filled 2021-03-08: qty 10

## 2021-03-08 MED ORDER — SODIUM CHLORIDE 0.9% FLUSH: 3.0000 mL | INTRAVENOUS | Status: DC | PRN

## 2021-03-08 MED ORDER — ONDANSETRON HCL 4 MG/2ML IJ SOLN
4.0000 mg | Freq: Four times a day (QID) | INTRAMUSCULAR | Status: DC | PRN
  Administered 2021-03-11: 4 mg via INTRAVENOUS
  Filled 2021-03-08: qty 2

## 2021-03-08 MED ORDER — INSULIN REGULAR(HUMAN) IN NACL 100-0.9 UT/100ML-% IV SOLN
INTRAVENOUS | Status: DC
  Administered 2021-03-09: 2.6 [IU]/h via INTRAVENOUS
  Filled 2021-03-08: qty 100

## 2021-03-08 MED ORDER — PROPOFOL 10 MG/ML IV BOLUS
INTRAVENOUS | Status: AC
  Filled 2021-03-08: qty 20

## 2021-03-08 MED ORDER — PROPOFOL 10 MG/ML IV BOLUS
INTRAVENOUS | Status: DC | PRN
  Administered 2021-03-08: 120 mg via INTRAVENOUS

## 2021-03-08 MED ORDER — CHLORHEXIDINE GLUCONATE CLOTH 2 % EX PADS
6.0000 | MEDICATED_PAD | Freq: Every day | CUTANEOUS | Status: DC
  Administered 2021-03-09 – 2021-03-10 (×3): 6 via TOPICAL

## 2021-03-08 MED ORDER — SODIUM CHLORIDE 0.9% FLUSH
3.0000 mL | Freq: Two times a day (BID) | INTRAVENOUS | Status: DC
  Administered 2021-03-09 – 2021-03-10 (×4): 3 mL via INTRAVENOUS

## 2021-03-08 MED ORDER — LACTATED RINGERS IV SOLN: 500.0000 mL | Freq: Once | INTRAVENOUS | Status: DC | PRN

## 2021-03-08 MED ORDER — PANTOPRAZOLE SODIUM 40 MG PO TBEC
40.0000 mg | DELAYED_RELEASE_TABLET | Freq: Every day | ORAL | Status: DC
  Administered 2021-03-10 – 2021-03-15 (×6): 40 mg via ORAL
  Filled 2021-03-08 (×6): qty 1

## 2021-03-08 MED ORDER — PHENYLEPHRINE HCL (PRESSORS) 10 MG/ML IV SOLN
INTRAVENOUS | Status: AC
  Filled 2021-03-08: qty 2

## 2021-03-08 MED ORDER — ALBUMIN HUMAN 5 % IV SOLN
250.0000 mL | INTRAVENOUS | Status: AC | PRN
Start: 2021-03-08 — End: 2021-03-09
  Administered 2021-03-08 – 2021-03-09 (×4): 12.5 g via INTRAVENOUS
  Filled 2021-03-08 (×2): qty 250

## 2021-03-08 MED ORDER — ROCURONIUM BROMIDE 10 MG/ML (PF) SYRINGE
PREFILLED_SYRINGE | INTRAVENOUS | Status: DC | PRN
  Administered 2021-03-08: 60 mg via INTRAVENOUS
  Administered 2021-03-08: 100 mg via INTRAVENOUS
  Administered 2021-03-08: 40 mg via INTRAVENOUS

## 2021-03-08 MED ORDER — HEPARIN SODIUM (PORCINE) 1000 UNIT/ML IJ SOLN
INTRAMUSCULAR | Status: DC | PRN
  Administered 2021-03-08: 30000 [IU] via INTRAVENOUS

## 2021-03-08 MED ORDER — ARTIFICIAL TEARS OPHTHALMIC OINT
TOPICAL_OINTMENT | OPHTHALMIC | Status: AC
  Filled 2021-03-08: qty 3.5

## 2021-03-08 MED ORDER — 0.9 % SODIUM CHLORIDE (POUR BTL) OPTIME
TOPICAL | Status: DC | PRN
  Administered 2021-03-08: 4000 mL
  Administered 2021-03-08: 1000 mL

## 2021-03-08 MED ORDER — ORAL CARE MOUTH RINSE
15.0000 mL | OROMUCOSAL | Status: DC
  Administered 2021-03-08 – 2021-03-09 (×13): 15 mL via OROMUCOSAL

## 2021-03-08 MED ORDER — TRANEXAMIC ACID 1000 MG/10ML IV SOLN
1.5000 mg/kg/h | INTRAVENOUS | Status: DC
  Filled 2021-03-08: qty 25

## 2021-03-08 MED ORDER — SODIUM CHLORIDE 0.9 % IV SOLN: 250.0000 mL | INTRAVENOUS | Status: DC

## 2021-03-08 MED ORDER — PHENYLEPHRINE HCL-NACL 20-0.9 MG/250ML-% IV SOLN
0.0000 ug/min | INTRAVENOUS | Status: DC
  Administered 2021-03-08: 50 ug/min via INTRAVENOUS
  Filled 2021-03-08: qty 250

## 2021-03-08 MED ORDER — MORPHINE SULFATE (PF) 2 MG/ML IV SOLN
1.0000 mg | INTRAVENOUS | Status: DC | PRN
  Administered 2021-03-08 – 2021-03-09 (×8): 4 mg via INTRAVENOUS
  Administered 2021-03-09: 2 mg via INTRAVENOUS
  Administered 2021-03-09 (×3): 4 mg via INTRAVENOUS
  Administered 2021-03-10 (×2): 2 mg via INTRAVENOUS
  Administered 2021-03-10: 4 mg via INTRAVENOUS
  Administered 2021-03-10: 2 mg via INTRAVENOUS
  Administered 2021-03-10: 4 mg via INTRAVENOUS
  Administered 2021-03-10: 2 mg via INTRAVENOUS
  Administered 2021-03-10: 4 mg via INTRAVENOUS
  Administered 2021-03-11: 2 mg via INTRAVENOUS
  Filled 2021-03-08: qty 1
  Filled 2021-03-08 (×2): qty 2
  Filled 2021-03-08 (×2): qty 1
  Filled 2021-03-08 (×7): qty 2
  Filled 2021-03-08: qty 1
  Filled 2021-03-08: qty 2
  Filled 2021-03-08 (×2): qty 1
  Filled 2021-03-08 (×4): qty 2

## 2021-03-08 MED ORDER — DOCUSATE SODIUM 100 MG PO CAPS
200.0000 mg | ORAL_CAPSULE | Freq: Every day | ORAL | Status: DC
  Administered 2021-03-10 – 2021-03-14 (×4): 200 mg via ORAL
  Filled 2021-03-08 (×6): qty 2

## 2021-03-08 MED ORDER — MILRINONE LACTATE IN DEXTROSE 20-5 MG/100ML-% IV SOLN
0.2500 ug/kg/min | INTRAVENOUS | Status: DC
  Administered 2021-03-08 – 2021-03-09 (×3): 0.25 ug/kg/min via INTRAVENOUS
  Filled 2021-03-08 (×2): qty 100

## 2021-03-08 MED ORDER — DEXTROSE 50 % IV SOLN: 0.0000 mL | INTRAVENOUS | Status: DC | PRN

## 2021-03-08 MED ORDER — SODIUM CHLORIDE 0.45 % IV SOLN: INTRAVENOUS | Status: DC | PRN

## 2021-03-08 MED ORDER — MIDAZOLAM HCL 2 MG/2ML IJ SOLN
2.0000 mg | INTRAMUSCULAR | Status: DC | PRN
  Administered 2021-03-08 – 2021-03-09 (×8): 2 mg via INTRAVENOUS
  Filled 2021-03-08 (×8): qty 2

## 2021-03-08 MED ORDER — ACETAMINOPHEN 500 MG PO TABS
1000.0000 mg | ORAL_TABLET | Freq: Four times a day (QID) | ORAL | Status: AC
  Administered 2021-03-10 – 2021-03-13 (×14): 1000 mg via ORAL
  Filled 2021-03-08 (×14): qty 2

## 2021-03-08 SURGICAL SUPPLY — 119 items
ADAPTER CARDIO PERF ANTE/RETRO (ADAPTER) ×2 IMPLANT
ADAPTER MULTI PERFUSION 15 (ADAPTER) ×2 IMPLANT
APPLICATOR TIP COSEAL (VASCULAR PRODUCTS) IMPLANT
ATRICLIP EXCLUSION VLAA 50 (Miscellaneous) ×2 IMPLANT
BAG DECANTER FOR FLEXI CONT (MISCELLANEOUS) ×2 IMPLANT
BLADE CLIPPER SURG (BLADE) ×2 IMPLANT
BLADE STERNUM SYSTEM 6 (BLADE) ×2 IMPLANT
BLADE SURG 15 STRL LF DISP TIS (BLADE) ×2 IMPLANT
BLADE SURG 15 STRL SS (BLADE) ×2
CANISTER SUCT 3000ML PPV (MISCELLANEOUS) ×2 IMPLANT
CANN PRFSN 3/8XCNCT ST RT ANG (MISCELLANEOUS) ×1
CANN PRFSN 3/8XRT ANG TPR 14 (MISCELLANEOUS) ×1
CANNULA AORTIC ROOT 9FR (CANNULA) ×2 IMPLANT
CANNULA NON VENT 20FR 12 (CANNULA) ×2 IMPLANT
CANNULA PRFSN 3/8XCNCT RT ANG (MISCELLANEOUS) ×1 IMPLANT
CANNULA PRFSN 3/8XRT ANG TPR14 (MISCELLANEOUS) ×1 IMPLANT
CANNULA SUMP PERICARDIAL (CANNULA) ×4 IMPLANT
CANNULA VEN MTL TIP RT (MISCELLANEOUS) ×2
CATH CPB KIT HENDRICKSON (MISCELLANEOUS) ×2 IMPLANT
CATH HEART VENT LEFT (CATHETERS) ×1 IMPLANT
CATH RETROPLEGIA CORONARY 14FR (CATHETERS) ×2 IMPLANT
CATH ROBINSON RED A/P 18FR (CATHETERS) ×10 IMPLANT
CLAMP ISOLATOR SYNERGY LG (MISCELLANEOUS) ×2 IMPLANT
CNTNR URN SCR LID CUP LEK RST (MISCELLANEOUS) ×4 IMPLANT
CONN 1/2X1/2X1/2  BEN (MISCELLANEOUS) ×1
CONN 1/2X1/2X1/2 BEN (MISCELLANEOUS) ×1 IMPLANT
CONN 3/8X1/2 ST GISH (MISCELLANEOUS) ×4 IMPLANT
CONN ST 1/4X3/8  BEN (MISCELLANEOUS) ×3
CONN ST 1/4X3/8 BEN (MISCELLANEOUS) ×3 IMPLANT
CONT SPEC 4OZ STRL OR WHT (MISCELLANEOUS) ×4
CONTAINER PROTECT SURGISLUSH (MISCELLANEOUS) ×4 IMPLANT
COUNTER NEEDLE 20 DBL MAG RED (NEEDLE) ×2 IMPLANT
DEVICE EXCLUSIN ATRCLP VLAA 50 (Miscellaneous) ×1 IMPLANT
DEVICE SUT CK QUICK LOAD MINI (Prosthesis & Implant Heart) ×8 IMPLANT
DRAIN CHANNEL 28F RND 3/8 FF (WOUND CARE) ×6 IMPLANT
DRAPE WARM FLUID 44X44 (DRAPES) IMPLANT
DRSG AQUACEL AG ADV 3.5X14 (GAUZE/BANDAGES/DRESSINGS) ×2 IMPLANT
DRSG COVADERM 4X14 (GAUZE/BANDAGES/DRESSINGS) ×2 IMPLANT
ELECT CAUTERY BLADE 6.4 (BLADE) ×2 IMPLANT
ELECT REM PT RETURN 9FT ADLT (ELECTROSURGICAL) ×4
ELECTRODE REM PT RTRN 9FT ADLT (ELECTROSURGICAL) ×2 IMPLANT
FELT TEFLON 1X6 (MISCELLANEOUS) ×2 IMPLANT
GAUZE SPONGE 4X4 12PLY STRL (GAUZE/BANDAGES/DRESSINGS) ×2 IMPLANT
GAUZE SPONGE 4X4 12PLY STRL LF (GAUZE/BANDAGES/DRESSINGS) ×2 IMPLANT
GLOVE BIO SURGEON STRL SZ 6 (GLOVE) IMPLANT
GLOVE BIO SURGEON STRL SZ 6.5 (GLOVE) IMPLANT
GLOVE BIO SURGEON STRL SZ7 (GLOVE) IMPLANT
GLOVE BIO SURGEON STRL SZ7.5 (GLOVE) IMPLANT
GLOVE NEODERM STRL 7.5 LF PF (GLOVE) ×2 IMPLANT
GLOVE SURG MICRO LTX SZ6.5 (GLOVE) ×2 IMPLANT
GLOVE SURG NEODERM 7.5  LF PF (GLOVE) ×2
GOWN STRL REUS W/ TWL LRG LVL3 (GOWN DISPOSABLE) ×10 IMPLANT
GOWN STRL REUS W/TWL LRG LVL3 (GOWN DISPOSABLE) ×10
HEMOSTAT POWDER SURGIFOAM 1G (HEMOSTASIS) ×6 IMPLANT
HEMOSTAT SURGICEL 2X14 (HEMOSTASIS) ×2 IMPLANT
INSERT FOGARTY XLG (MISCELLANEOUS) ×2 IMPLANT
INSERT SUTURE HOLDER (MISCELLANEOUS) ×2 IMPLANT
KIT APPLICATOR RATIO 11:1 (KITS) ×2 IMPLANT
KIT BASIN OR (CUSTOM PROCEDURE TRAY) ×2 IMPLANT
KIT SUCTION CATH 14FR (SUCTIONS) ×2 IMPLANT
KIT SUT CK MINI COMBO 4X17 (Prosthesis & Implant Heart) ×2 IMPLANT
KIT TURNOVER KIT B (KITS) ×2 IMPLANT
LINE VENT (MISCELLANEOUS) ×2 IMPLANT
LOOP VESSEL SUPERMAXI WHITE (MISCELLANEOUS) ×2 IMPLANT
NS IRRIG 1000ML POUR BTL (IV SOLUTION) ×10 IMPLANT
PACK E OPEN HEART (SUTURE) ×2 IMPLANT
PACK OPEN HEART (CUSTOM PROCEDURE TRAY) ×2 IMPLANT
PACK PLATELET PROCEDURE 60 (MISCELLANEOUS) ×2 IMPLANT
PAD ARMBOARD 7.5X6 YLW CONV (MISCELLANEOUS) ×4 IMPLANT
POSITIONER HEAD DONUT 9IN (MISCELLANEOUS) ×2 IMPLANT
POWDER SURGICEL 3.0 GRAM (HEMOSTASIS) ×2 IMPLANT
PROBE CRYO2-ABLATION MALLABLE (MISCELLANEOUS) ×2 IMPLANT
RING ANLPLS CARP-EDW PHY II 28 (Prosthesis & Implant Heart) ×1 IMPLANT
RING ANNULOPLASTY PHYSIO II 28 (Prosthesis & Implant Heart) ×1 IMPLANT
SET CARDIOPLEGIA MPS 5001102 (MISCELLANEOUS) ×2 IMPLANT
SIZER CHORD-X CHORDAL CXCS (SIZER) ×2 IMPLANT
SUT BONE WAX W31G (SUTURE) ×2 IMPLANT
SUT EB EXC GRN/WHT 2-0 V-5 (SUTURE) ×4 IMPLANT
SUT ETHIBON EXCEL 2-0 V-5 (SUTURE) IMPLANT
SUT ETHIBOND (SUTURE) ×2 IMPLANT
SUT ETHIBOND 2 0 SH (SUTURE) ×7 IMPLANT
SUT ETHIBOND 2 0 SH 36X2 (SUTURE) ×1 IMPLANT
SUT ETHIBOND 2 0 V5 (SUTURE) ×2 IMPLANT
SUT ETHIBOND 2-0 RB-1 WHT (SUTURE) ×2 IMPLANT
SUT ETHIBOND V-5 VALVE (SUTURE) IMPLANT
SUT GORETEX TH-18 36 INCH (SUTURE) ×2 IMPLANT
SUT PROLENE 3 0 SH 1 (SUTURE) ×2 IMPLANT
SUT PROLENE 3 0 SH 48 (SUTURE) ×4 IMPLANT
SUT PROLENE 3 0 SH DA (SUTURE) ×6 IMPLANT
SUT PROLENE 4 0 RB 1 (SUTURE) ×4
SUT PROLENE 4 0 SH DA (SUTURE) ×8 IMPLANT
SUT PROLENE 4-0 RB1 .5 CRCL 36 (SUTURE) ×4 IMPLANT
SUT PROLENE 5 0 C 1 36 (SUTURE) ×2 IMPLANT
SUT PTFE CHORD X 20MM (SUTURE) ×2 IMPLANT
SUT SILK  1 MH (SUTURE) ×1
SUT SILK 1 MH (SUTURE) ×1 IMPLANT
SUT STEEL 6MS V (SUTURE) ×2 IMPLANT
SUT STEEL STERNAL CCS#1 18IN (SUTURE) ×2 IMPLANT
SUT STEEL SZ 6 DBL 3X14 BALL (SUTURE) IMPLANT
SUT VIC AB 1 CTX 36 (SUTURE) ×2
SUT VIC AB 1 CTX36XBRD ANBCTR (SUTURE) ×2 IMPLANT
SUT VIC AB 2-0 CT1 27 (SUTURE)
SUT VIC AB 2-0 CT1 TAPERPNT 27 (SUTURE) IMPLANT
SUT VIC AB 3-0 X1 27 (SUTURE) IMPLANT
SYSTEM SAHARA CHEST DRAIN ATS (WOUND CARE) ×2 IMPLANT
TAPE CLOTH SURG 4X10 WHT LF (GAUZE/BANDAGES/DRESSINGS) ×2 IMPLANT
TAPE PAPER 2X10 WHT MICROPORE (GAUZE/BANDAGES/DRESSINGS) ×2 IMPLANT
TIP DUAL SPRAY TOPICAL (TIP) ×4 IMPLANT
TOWEL GREEN STERILE (TOWEL DISPOSABLE) ×2 IMPLANT
TOWEL GREEN STERILE FF (TOWEL DISPOSABLE) ×2 IMPLANT
TRAY FOLEY SLVR 14FR TEMP STAT (SET/KITS/TRAYS/PACK) ×2 IMPLANT
TUBE SUCT INTRACARD DLP 20F (MISCELLANEOUS) ×2 IMPLANT
TUBING ART PRESS 12 MALE/MALE (MISCELLANEOUS) ×2 IMPLANT
TUBING ART PRESS 72  MALE/FEM (TUBING) ×2
TUBING ART PRESS 72 MALE/FEM (TUBING) ×2 IMPLANT
UNDERPAD 30X36 HEAVY ABSORB (UNDERPADS AND DIAPERS) ×2 IMPLANT
VALVE MAGNA MITRAL 25MM (Prosthesis & Implant Heart) ×2 IMPLANT
VENT LEFT HEART 12002 (CATHETERS) ×2
WATER STERILE IRR 1000ML POUR (IV SOLUTION) ×4 IMPLANT

## 2021-03-08 NOTE — Progress Notes (Addendum)
PROGRESS NOTE    Michael Valenzuela  UXN:[id number] DOB: Mar 28, 1956 DOA: 03/01/2021 PCP: Chevis Pretty, FNP   Brief Narrative:  HPI On 03/01/2021 by Dr. Loma Boston Michael Valenzuela Michael Valenzuela is a 65 y.o. male with a history of diabetes, fibromyalgia, hyperlipidemia and hypertension.  Patient presents with 3 to 4 days pleurisy and orthopnea.  His symptoms have been worsening with past couple of days.  He has been having increasing difficulty sleeping due to the pleurisy and presented to urgent care for evaluation.  EKG was done and the patient was found to be in atrial fibrillation.  He was sent to the hospital for further evaluation.  Normal here, he was in sinus rhythm, but then converted into atrial fibrillation.  He has been rate controlled.  Patient has been mildly hypoxic, which improved with oxygen via nasal cannula.  No palliating or provoking factors.  Denies fevers, chills, nausea, vomiting.  Denies chest pain  Interim history Patient was admitted with acute hypoxic respiratory failure as well as heart failure exacerbation.  Initially was admitted to [facility] but then transferred to [facility].  Patient was placed on diuretics as well as nebulizers and steroids for possible bronchitis.  Echocardiogram showed moderate to severe MR.  Cardiology consulted and appreciated.  TEE showed severe mitral valve regurgitation.  Right and left heart cath performed.  TCTS consulted, surgery planned today. Assessment & Plan   Acute hypoxic respiratory failure -Possibly multifactorial including CHF exacerbation, new diagnosis of atrial fibrillation, severe mitral valve regurgitation -oxygen saturations 87-89% on room air upon presentation and was placed on 4L O2 -Patient now weaned to room air -CTA showed no PE  Atrial fibrillation, new onset -Eliquis currently held -Continue full dose Lovenox  Mitral regurgitation -TEE showed an EF of 60 to 65%.  LA size severely dilated.  No atrial appendage  thrombus detected.  Mitral valve is abnormal.  Flail P2.  Severe mitral valve regurgitation.  Severe MR. -Cardiothoracic surgery consulted and appreciated and feels patient would be a good candidate for mitral valve replacement or repair with biatrial ablation.   -Unable to reach dentistry -Mitral valve repair/replacement planned for today -Suspect patient will be admitted to ICU post-op  Acute diastolic CHF exacerbation -Echocardiogram showed an EF of 70-75%, no regional motion abnormalities.  LV diastolic function could not be evaluated. -CTA chest showed moderate bilateral pleural effusions, small septal thickening consistent with pulmonary edema.  -Monitor intake and output, daily weights -was on IV Lasix 40 mg daily  Concern for bronchitis -Noted on CTA: Peribronchial groundglass opacities in the right perihilar lung and right upper lobe. Bronchial wall thickening-bronchitis or congestion. -Patient may need repeat imaging for follow-up of resolution -Completed steroids  Hyperlipidemia -Continue statin, fenofibrate  Diabetes mellitus, type II -Metformin and Januvia held -Continue Levemir, insulin sliding scale, CBG monitoring  Essential hypertension -Continue amlodipine, Lasix  DVT Prophylaxis Lovenox  Code Status: Full  Family Communication: None at bedside  Disposition Plan:  Status is: Inpatient  Remains inpatient appropriate because:IV treatments appropriate due to intensity of illness or inability to take PO and Inpatient level of care appropriate due to severity of illness   Dispo: The patient is from: Home              Anticipated d/c is to: Home              Patient currently is not medically stable to d/c.   Difficult to place patient No  Consultants Cardiology Cardiothoracic surgery  Procedures  Echocardiogram TEE  Antibiotics   Anti-infectives (From admission, onward)   Start     Dose/Rate Route Frequency Ordered Stop   03/08/21 2130  vancomycin  (VANCOCIN) IVPB 1000 mg/200 mL premix        1,000 mg 200 mL/hr over 60 Minutes Intravenous  Once 03/08/21 1449     03/08/21 1730  cefUROXime (ZINACEF) 1.5 g in sodium chloride 0.9 % 100 mL IVPB        1.5 g 200 mL/hr over 30 Minutes Intravenous Every 12 hours 03/08/21 1449 03/10/21 1729   03/08/21 0844  vancomycin (VANCOCIN) powder  Status:  Discontinued          As needed 03/08/21 0844 03/08/21 1449   03/08/21 0400  vancomycin (VANCOREADY) IVPB 1500 mg/300 mL        1,500 mg 150 mL/hr over 120 Minutes Intravenous To Surgery 03/07/21 0720 03/08/21 0800   03/08/21 0400  cefUROXime (ZINACEF) 1.5 g in sodium chloride 0.9 % 100 mL IVPB        1.5 g 200 mL/hr over 30 Minutes Intravenous To Surgery 03/07/21 0720 03/08/21 0800   03/08/21 0400  cefUROXime (ZINACEF) 750 mg in sodium chloride 0.9 % 100 mL IVPB        750 mg 200 mL/hr over 30 Minutes Intravenous To Surgery 03/07/21 0720 03/08/21 1326      Subjective:   Theador Fithen was not see seen today as he has been in the OR.   Objective:   Vitals:   03/07/21 1942 03/07/21 1951 03/08/21 0504 03/08/21 0508  BP:  111/85  109/86  Pulse: 79 90  72  Resp: 16 18  18   Temp:  98.2 F (36.8 C)  98.5 F (36.9 C)  TempSrc:  Oral  Oral  SpO2: 98% 97%  99%  Weight:   84.3 kg   Height:        Intake/Output Summary (Last 24 hours) at 03/08/2021 1458 Last data filed at 03/08/2021 1409 Gross per 24 hour  Intake 2530 ml  Output 2025 ml  Net 505 ml   Filed Weights   03/06/21 0415 03/07/21 0211 03/08/21 0504  Weight: 86 kg 85.5 kg 84.3 kg   Exam Patient was not examined today as he has been in the OR.    Data Reviewed: I have personally reviewed following labs and imaging studies  CBC: Recent Labs  Lab 03/05/21 0409 03/05/21 1327 03/06/21 0644 03/08/21 0436 03/08/21 0758 03/08/21 1209 03/08/21 1241 03/08/21 1246 03/08/21 1334 03/08/21 1338  WBC 15.4*  --  9.8 12.7*  --   --   --   --   --   --   HGB 12.8*   < > 14.1 13.4   <  > 8.8* 8.2* 8.8* 8.5* 9.2*  HCT 38.2*   < > 42.9 40.2   < > 26.0* 24.0* 25.9* 25.0* 27.0*  MCV 88.6  --  90.1 89.1  --   --   --   --   --   --   PLT 340  --  351 313  --   --   --  207  --   --    < > = values in this interval not displayed.   Basic Metabolic Panel: Recent Labs  Lab 03/02/21 0512 03/03/21 0503 03/04/21 0358 03/05/21 0409 03/05/21 1327 03/06/21 [id number] 03/07/21 0336 03/08/21 0436 03/08/21 0758 03/08/21 0939 03/08/21 1007 03/08/21 1039 03/08/21 1109 03/08/21 1140 03/08/21 1209 03/08/21 1241 03/08/21 1334 03/08/21 1338  NA 137   < > 135 133*   < > 136 134* 134*   < > 137   < > 138   < > 137 139 138 139 140  K 4.2   < > 4.4 4.4   < > 4.5 4.0 4.3   < > 4.4   < > 4.1   < > 4.2 4.5 4.4 4.1 4.1  CL 101   < > 101 103  --  104 101 101   < > 103  --  102  --  105  --  105 106  --   CO2 25   < > 22 24  --  25 24 24   --   --   --   --   --   --   --   --   --   --   GLUCOSE 153*   < > 204* 157*  --  150* 144* 152*   < > 133*  --  137*  --  132*  --  128* 123*  --   BUN 28*   < > 38* 41*  --  30* 29* 23   < > 21  --  23  --  23  --  22 23  --   CREATININE 1.07   < > 0.98 1.07  --  1.02 0.90 0.97   < > 0.80  --  0.70  --  0.70  --  0.60* 0.70  --   CALCIUM 9.4   < > 9.5 9.1  --  8.9 8.7* 9.1  --   --   --   --   --   --   --   --   --   --   MG 2.1  --   --   --   --  2.3  --   --   --   --   --   --   --   --   --   --   --   --   PHOS  --   --   --   --   --  3.3  --   --   --   --   --   --   --   --   --   --   --   --    < > = values in this interval not displayed.   GFR: Estimated Creatinine Clearance: 101 mL/min (by C-G formula based on SCr of 0.7 mg/dL). Liver Function Tests: Recent Labs  Lab 03/08/21 0436  AST 22  ALT 21  ALKPHOS 38  BILITOT 0.9  PROT 6.2*  ALBUMIN 3.3*   No results for input(s): LIPASE, AMYLASE in the last 168 hours. No results for input(s): AMMONIA in the last 168 hours. Coagulation Profile: Recent Labs  Lab 03/07/21 1738  INR 1.0    Cardiac Enzymes: No results for input(s): CKTOTAL, CKMB, CKMBINDEX, TROPONINI in the last 168 hours. BNP (last 3 results) No results for input(s): PROBNP in the last 8760 hours. HbA1C: Recent Labs    03/06/21 0645  HGBA1C 6.7*   CBG: Recent Labs  Lab 03/07/21 0557 03/07/21 1130 03/07/21 1617 03/07/21 2122 03/08/21 0537  GLUCAP 162* 193* 297* 165* 173*   Lipid Profile: No results for input(s): CHOL, HDL, LDLCALC, TRIG, CHOLHDL, LDLDIRECT in the last 72 hours. Thyroid Function Tests: No results for input(s): TSH, T4TOTAL, FREET4, T3FREE, THYROIDAB in the last 72  hours. Anemia Panel: No results for input(s): VITAMINB12, FOLATE, FERRITIN, TIBC, IRON, RETICCTPCT in the last 72 hours. Urine analysis:    Component Value Date/Time   COLORURINE YELLOW 03/07/2021 0757   APPEARANCEUR HAZY (A) 03/07/2021 0757   LABSPEC 1.023 03/07/2021 0757   PHURINE 6.0 03/07/2021 0757   GLUCOSEU NEGATIVE 03/07/2021 0757   HGBUR NEGATIVE 03/07/2021 0757   BILIRUBINUR NEGATIVE 03/07/2021 0757   KETONESUR NEGATIVE 03/07/2021 0757   PROTEINUR NEGATIVE 03/07/2021 0757   NITRITE NEGATIVE 03/07/2021 0757   LEUKOCYTESUR NEGATIVE 03/07/2021 0757   Sepsis Labs: @LABRCNTIP (procalcitonin:4,lacticidven:4)  ) Recent Results (from the past 240 hour(s))  Resp Panel by RT-PCR (Flu A&B, Covid) Nasopharyngeal Swab     Status: None   Collection Time: 03/01/21  3:05 PM   Specimen: Nasopharyngeal Swab; Nasopharyngeal(NP) swabs in vial transport medium  Result Value Ref Range Status   SARS Coronavirus 2 by RT PCR NEGATIVE NEGATIVE Final    Comment: (NOTE) SARS-CoV-2 target nucleic acids are NOT DETECTED.  The SARS-CoV-2 RNA is generally detectable in upper respiratory specimens during the acute phase of infection. The lowest concentration of SARS-CoV-2 viral copies this assay can detect is 138 copies/mL. A negative result does not preclude SARS-Cov-2 infection and should not be used as the sole basis for  treatment or other patient management decisions. A negative result may occur with  improper specimen collection/handling, submission of specimen other than nasopharyngeal swab, presence of viral mutation(s) within the areas targeted by this assay, and inadequate number of viral copies(<138 copies/mL). A negative result must be combined with clinical observations, patient history, and epidemiological information. The expected result is Negative.  Fact Sheet for Patients:  EntrepreneurPulse.com.au  Fact Sheet for Healthcare Providers:  IncredibleEmployment.be  This test is no t yet approved or cleared by the [place] FDA and  has been authorized for detection and/or diagnosis of SARS-CoV-2 by FDA under an Emergency Use Authorization (EUA). This EUA will remain  in effect (meaning this test can be used) for the duration of the COVID-19 declaration under Section 564(b)(1) of the Act, 21 U.S.C.section 360bbb-3(b)(1), unless the authorization is terminated  or revoked sooner.       Influenza A by PCR NEGATIVE NEGATIVE Final   Influenza B by PCR NEGATIVE NEGATIVE Final    Comment: (NOTE) The Xpert Xpress SARS-CoV-2/FLU/RSV plus assay is intended as an aid in the diagnosis of influenza from Nasopharyngeal swab specimens and should not be used as a sole basis for treatment. Nasal washings and aspirates are unacceptable for Xpert Xpress SARS-CoV-2/FLU/RSV testing.  Fact Sheet for Patients: EntrepreneurPulse.com.au  Fact Sheet for Healthcare Providers: IncredibleEmployment.be  This test is not yet approved or cleared by the [place] FDA and has been authorized for detection and/or diagnosis of SARS-CoV-2 by FDA under an Emergency Use Authorization (EUA). This EUA will remain in effect (meaning this test can be used) for the duration of the COVID-19 declaration under Section 564(b)(1) of the Act, 21  U.S.C. section 360bbb-3(b)(1), unless the authorization is terminated or revoked.  Performed at [facility], [address] [place], [place] [address]   Surgical pcr screen     Status: None   Collection Time: 03/07/21  7:56 AM   Specimen: Nasal Mucosa; Nasal Swab  Result Value Ref Range Status   MRSA, PCR NEGATIVE NEGATIVE Final   Staphylococcus aureus NEGATIVE NEGATIVE Final    Comment: (NOTE) The Xpert SA Assay (FDA approved for NASAL specimens in patients 45 years of age and older), is  one component of a comprehensive surveillance program. It is not intended to diagnose infection nor to guide or monitor treatment. Performed at [facility], [place] [address], [place], [place] [address]       Radiology Studies: No results found.   Scheduled Meds: . [START ON 03/09/2021] acetaminophen  1,000 mg Oral Q6H   Or  . [START ON 03/09/2021] acetaminophen (TYLENOL) oral liquid 160 mg/5 mL  1,000 mg Per Tube Q6H  . acetaminophen (TYLENOL) oral liquid 160 mg/5 mL  650 mg Per Tube Once   Or  . acetaminophen  650 mg Rectal Once  . [START ON 03/09/2021] aspirin EC  325 mg Oral Daily   Or  . [START ON 03/09/2021] aspirin  324 mg Per Tube Daily  . [START ON 03/09/2021] bisacodyl  10 mg Oral Daily   Or  . [START ON 03/09/2021] bisacodyl  10 mg Rectal Daily  . chlorhexidine  15 mL Mouth/Throat NOW  . [START ON 03/09/2021] docusate sodium  200 mg Oral Daily  . [START ON 03/10/2021] pantoprazole  40 mg Oral Daily  . [START ON 03/09/2021] rosuvastatin  10 mg Oral Daily  . [START ON 03/09/2021] sodium chloride flush  3 mL Intravenous Q12H   Continuous Infusions: . sodium chloride    . [START ON 03/09/2021] sodium chloride    . sodium chloride    . albumin human    . cefUROXime (ZINACEF)  IV    . dexmedetomidine (PRECEDEX) IV infusion    . epinephrine    . famotidine (PEPCID) IV    . insulin    . lactated ringers    . lactated ringers    . lactated ringers    . magnesium sulfate    .  nitroGLYCERIN    . phenylephrine (NEO-SYNEPHRINE) Adult infusion    . potassium chloride    . vancomycin       LOS: 7 days   Time Spent in minutes   10 minutes  Vinicio Lynk D.O. on 03/08/2021 at 2:58 PM  Between 7am to 7pm - Please see pager noted on [email or web address]  After 7pm go to [email or web address]  And look for the night coverage person covering for me after hours  [facility] Office  [phone]

## 2021-03-08 NOTE — Hospital Course (Addendum)
  Referring: No ref. provider found Primary Care: Chevis Pretty, FNP Primary Cardiologist:No primary care provider on file- cathed by Glenetta Hew MD    HPI:   65 year old man with a history of fibromyalgia but otherwise in good health presented last week with relatively acute shortness of breath.  He presented to his primary care with the symptoms where he was diagnosed with new onset A. fib and treated with Lasix after discovery of hypoxia.  Objective evaluation demonstrated new systolic murmur and an echocardiogram demonstrating eccentric mitral regurgitation.  Transferred to [facility] and underwent left heart catheterization demonstrating clean coronaries.  Consultation is obtained for consideration of mitral valve surgery and atrial ablation.  Patient states that last night he experienced worse shortness of breath and required more diuretic.  This morning he is comfortable.  Hospital Course:   On 4/29, Michael Valenzuela underwent a mitral valve replacement, MAZE and atrial appendage clip with Dr. Orvan Seen. He tolerated the procedure well and was transferred to the surgical ICU. POD 1 we continue to wean his vasoactive drips. We set his pacemaker to a back-up rate. We planned to extubated later that day. He was extubated that night and POD 2 he was on milrinone 0.125. We kept his chest tubes in place due to output. We started a diuretic regimen for fluid overload. His pain was well controlled. We planned to slowly start coumadin for valve thrombus prophylaxis.  He has required an aggressive diuresis for volume overload.  He has responded well to this.  Renal function has remained within normal limits clinical volume overload but has shown steady improvement.  It was determined that he should be loaded with amiodarone  as he had preoperative atrial fibrillation, and this was started with oral dosing.  I have monitored daily with his Coumadin adjusted over time.  Epicardial pacing wires were removed on  postop day #4.  Oxygen is being weaned and he is maintaining good saturations on room air up to 2 L at times.  He continues with aggressive pulmonary toilet.  Blood sugars have been under good control and he will be resumed on his preoperative diabetic medication regimen.  He continues to have some intermittent episodes of rate controlled atrial fibrillation but is mostly in sinus rhythm.  He continues his amiodarone and has been changed to Eliquis from Coumadin.

## 2021-03-08 NOTE — Anesthesia Procedure Notes (Signed)
Central Venous Catheter Insertion Performed by: Audry Pili, MD, anesthesiologist Start/End4/29/2022 7:05 AM, 03/08/2021 7:15 AM Patient location: Pre-op. Preanesthetic checklist: patient identified, IV checked, risks and benefits discussed, surgical consent, monitors and equipment checked, pre-op evaluation, timeout performed and anesthesia consent Position: Trendelenburg Lidocaine 1% used for infiltration and patient sedated Hand hygiene performed , maximum sterile barriers used  and Seldinger technique used Catheter size: 8.5 Fr Central line was placed.MAC introducer Procedure performed using ultrasound guided technique. Ultrasound Notes:anatomy identified, needle tip was noted to be adjacent to the nerve/plexus identified, no ultrasound evidence of intravascular and/or intraneural injection and image(s) printed for medical record Attempts: 1 Following insertion, line sutured, dressing applied and Biopatch. Post procedure assessment: blood return through all ports, no air and free fluid flow  Patient tolerated the procedure well with no immediate complications.

## 2021-03-08 NOTE — H&P (Signed)
History and Physical Interval Note:  03/08/2021 7:25 AM  Michael Valenzuela  has presented today for surgery, with the diagnosis of SEVERE MR AFIB.  The various methods of treatment have been discussed with the patient and family. After consideration of risks, benefits and other options for treatment, the patient has consented to  Procedure(s): MITRAL VALVE REPAIR OR REPLACEMENT (MVR) (N/A) MAZE (N/A) TRANSESOPHAGEAL ECHOCARDIOGRAM (TEE) (N/A) as a surgical intervention.  The patient's history has been reviewed, patient examined, no change in status, stable for surgery.  I have reviewed the patient's chart and labs.  Questions were answered to the patient's satisfaction.     Wonda Olds

## 2021-03-08 NOTE — Brief Op Note (Signed)
03/01/2021 - 03/08/2021  2:33 PM  PATIENT:  Michael Valenzuela  65 y.o. male  PRE-OPERATIVE DIAGNOSIS:  Severe mitral regurgitation Atrial Fibrillation  POST-OPERATIVE DIAGNOSIS:  Severe mitral regurgitation Atrial Fibrillation  PROCEDURE:  Procedure(s): MITRAL VALVE REPAIR USING  PHYSIO RING SIZE 28 (EXPLANTED) THEN MITRAL VALVE REPLACEMENT (MVR) WITH MAGNA EASE MITRAL VALVE SIZE 25MM  ON PUMP, LEFT ATRIAL APPENDAGE CLIPPING USING A SIZE 50 ATRIAL CLIP (N/A) MAZE (N/A) TRANSESOPHAGEAL ECHOCARDIOGRAM (TEE) (N/A)  SURGEON:  Surgeon(s) and Role:    * Wonda Olds, MD - Primary  PHYSICIAN ASSISTANT:  Nicholes Rough, PA-C   ANESTHESIA:   general   BLOOD ADMINISTERED:none  DRAINS:  ROUTINE    LOCAL MEDICATIONS USED:  NONE  SPECIMEN:  Source of Specimen:  portion of the anterior leaflet, cords  DISPOSITION OF SPECIMEN:  PATHOLOGY  COUNTS:  YES   DICTATION: .Dragon Dictation  PLAN OF CARE: Admit to inpatient   PATIENT DISPOSITION:  ICU - intubated and hemodynamically stable.   Delay start of Pharmacological VTE agent (>24hrs) due to surgical blood loss or risk of bleeding: yes  Mixed mitral pathology including severe posterior leaflet/annular calcification, flail posterior leaflet and elongated cords to A2. Repair not satisfactory, leading to mitral replacement. Laquitta Dominski Z. Orvan Seen, [place] Smith

## 2021-03-08 NOTE — Anesthesia Procedure Notes (Addendum)
Procedure Name: Intubation Date/Time: 03/08/2021 7:51 AM Performed by: Lance Coon, CRNA Pre-anesthesia Checklist: Patient identified, Emergency Drugs available, Suction available, Patient being monitored and Timeout performed Patient Re-evaluated:Patient Re-evaluated prior to induction Oxygen Delivery Method: Circle system utilized Preoxygenation: Pre-oxygenation with 100% oxygen Induction Type: IV induction Ventilation: Mask ventilation without difficulty Laryngoscope Size: Miller and 3 Grade View: Grade II Tube type: Oral Tube size: 8.0 mm Number of attempts: 1 Airway Equipment and Method: Stylet Placement Confirmation: ETT inserted through vocal cords under direct vision,  positive ETCO2 and breath sounds checked- equal and bilateral Secured at: 23 cm Tube secured with: Tape Dental Injury: Teeth and Oropharynx as per pre-operative assessment

## 2021-03-08 NOTE — Anesthesia Procedure Notes (Signed)
Central Venous Catheter Insertion Performed by: Audry Pili, MD, anesthesiologist Start/End4/29/2022 7:13 AM, 03/08/2021 7:15 AM Patient location: Pre-op. Preanesthetic checklist: patient identified, IV checked, risks and benefits discussed, surgical consent, monitors and equipment checked, pre-op evaluation, timeout performed and anesthesia consent Position: Trendelenburg Hand hygiene performed  and maximum sterile barriers used  Total catheter length 10. PA cath was placed.Swan type:thermodilution PA Cath depth:52 Procedure performed without using ultrasound guided technique. Attempts: 1 Patient tolerated the procedure well with no immediate complications.

## 2021-03-08 NOTE — Progress Notes (Signed)
Received pt from the OR team. Pt was saturating 90% initally, but then went down into the mid 70s after RN/ RT hooked pt up to monitor. RT performed recruitment maneuver for 2 minutes with minimal change. RT increased FI02 to 100% and peep of 12 over the course of 5 minutes.. Pt's saturations increased to 100% and RT then started to titrate down 02 requirements to maintain appropriate satuartions for the pt. Pt is now on Peep 8, 50% sating 92-93%. Dr. Julien Girt and bedside RN  witnessed 02 changes. RT will monitor.

## 2021-03-08 NOTE — Anesthesia Procedure Notes (Signed)
Arterial Line Insertion Start/End4/29/2022 6:35 AM, 03/08/2021 6:45 AM Performed by: Lance Coon, CRNA, CRNA  Patient location: Pre-op. Preanesthetic checklist: patient identified, IV checked, site marked, risks and benefits discussed, surgical consent, monitors and equipment checked, pre-op evaluation, timeout performed and anesthesia consent Lidocaine 1% used for infiltration Left, radial was placed Catheter size: 20 G Hand hygiene performed  and maximum sterile barriers used   Attempts: 2 Procedure performed without using ultrasound guided technique. Following insertion, dressing applied and Biopatch. Post procedure assessment: normal and unchanged  Patient tolerated the procedure well with no immediate complications.

## 2021-03-08 NOTE — Transfer of Care (Signed)
Immediate Anesthesia Transfer of Care Note  Patient: Michael Valenzuela  Procedure(s) Performed: MITRAL VALVE REPAIR USING  PHYSIO RING SIZE 28 (EXPLANTED) THEN MITRAL VALVE REPLACEMENT (MVR) WITH MAGNA EASE MITRAL VALVE SIZE 25MM  ON PUMP, LEFT ATRIAL APPENDAGE CLIPPING USING A SIZE 50 ATRIAL CLIP (N/A ) MAZE (N/A ) TRANSESOPHAGEAL ECHOCARDIOGRAM (TEE) (N/A )  Patient Location: ICU  Anesthesia Type:General  Level of Consciousness: patient cooperative and Patient remains intubated per anesthesia plan  Airway & Oxygen Therapy: Patient remains intubated per anesthesia plan and Patient placed on Ventilator (see vital sign flow sheet for setting)  Post-op Assessment: Report given to RN and Post -op Vital signs reviewed and stable  Post vital signs: Reviewed and stable  Last Vitals:  Vitals Value Taken Time  BP    Temp    Pulse    Resp    SpO2      Last Pain:  Vitals:   03/08/21 0508  TempSrc: Oral  PainSc:       Patients Stated Pain Goal: 1 (93/81/01 7510)  Complications: No complications documented.

## 2021-03-08 NOTE — Progress Notes (Signed)
Aline difficult to draw back on and has dampened waveform at baseline and when flushed. Attempted hard flush and still dampened. NIBP cuff will be used for titration of medications.

## 2021-03-09 ENCOUNTER — Inpatient Hospital Stay (HOSPITAL_COMMUNITY): Payer: Medicare Other

## 2021-03-09 DIAGNOSIS — K0850 Unsatisfactory restoration of tooth, unspecified: Secondary | ICD-10-CM

## 2021-03-09 DIAGNOSIS — Z20822 Contact with and (suspected) exposure to covid-19: Secondary | ICD-10-CM | POA: Diagnosis not present

## 2021-03-09 DIAGNOSIS — I340 Nonrheumatic mitral (valve) insufficiency: Secondary | ICD-10-CM | POA: Diagnosis not present

## 2021-03-09 DIAGNOSIS — F40232 Fear of other medical care: Secondary | ICD-10-CM

## 2021-03-09 DIAGNOSIS — Z01818 Encounter for other preprocedural examination: Secondary | ICD-10-CM

## 2021-03-09 DIAGNOSIS — K0530 Chronic periodontitis, unspecified: Secondary | ICD-10-CM

## 2021-03-09 DIAGNOSIS — I4891 Unspecified atrial fibrillation: Secondary | ICD-10-CM | POA: Diagnosis not present

## 2021-03-09 DIAGNOSIS — K029 Dental caries, unspecified: Secondary | ICD-10-CM

## 2021-03-09 DIAGNOSIS — K045 Chronic apical periodontitis: Secondary | ICD-10-CM

## 2021-03-09 DIAGNOSIS — R0600 Dyspnea, unspecified: Secondary | ICD-10-CM | POA: Diagnosis not present

## 2021-03-09 DIAGNOSIS — I5033 Acute on chronic diastolic (congestive) heart failure: Secondary | ICD-10-CM | POA: Diagnosis not present

## 2021-03-09 DIAGNOSIS — K036 Deposits [accretions] on teeth: Secondary | ICD-10-CM

## 2021-03-09 DIAGNOSIS — K083 Retained dental root: Secondary | ICD-10-CM

## 2021-03-09 DIAGNOSIS — Z006 Encounter for examination for normal comparison and control in clinical research program: Secondary | ICD-10-CM | POA: Diagnosis not present

## 2021-03-09 DIAGNOSIS — I10 Essential (primary) hypertension: Secondary | ICD-10-CM | POA: Diagnosis not present

## 2021-03-09 LAB — POCT I-STAT 7, (LYTES, BLD GAS, ICA,H+H)
Acid-base deficit: 3 mmol/L — ABNORMAL HIGH (ref 0.0–2.0)
Acid-base deficit: 2 mmol/L (ref 0.0–2.0)
Bicarbonate: 21.8 mmol/L (ref 20.0–28.0)
Bicarbonate: 22 mmol/L (ref 20.0–28.0)
Calcium, Ion: 1.11 mmol/L — ABNORMAL LOW (ref 1.15–1.40)
Calcium, Ion: 1.12 mmol/L — ABNORMAL LOW (ref 1.15–1.40)
HCT: 28 % — ABNORMAL LOW (ref 39.0–52.0)
HCT: 21 % — ABNORMAL LOW (ref 39.0–52.0)
Hemoglobin: 9.5 g/dL — ABNORMAL LOW (ref 13.0–17.0)
Hemoglobin: 7.1 g/dL — ABNORMAL LOW (ref 13.0–17.0)
O2 Saturation: 100 %
O2 Saturation: 91 %
Patient temperature: 37.3
Patient temperature: 37.8
Potassium: 4.5 mmol/L (ref 3.5–5.1)
Potassium: 4.4 mmol/L (ref 3.5–5.1)
Sodium: 140 mmol/L (ref 135–145)
Sodium: 141 mmol/L (ref 135–145)
TCO2: 23 mmol/L (ref 22–32)
TCO2: 23 mmol/L (ref 22–32)
pCO2 arterial: 39.7 mmHg (ref 32.0–48.0)
pCO2 arterial: 33.9 mmHg (ref 32.0–48.0)
pH, Arterial: 7.351 (ref 7.350–7.450)
pH, Arterial: 7.424 (ref 7.350–7.450)
pO2, Arterial: 234 mmHg — ABNORMAL HIGH (ref 83.0–108.0)
pO2, Arterial: 62 mmHg — ABNORMAL LOW (ref 83.0–108.0)

## 2021-03-09 LAB — BASIC METABOLIC PANEL
Anion gap: 9 (ref 5–15)
Anion gap: 5 (ref 5–15)
BUN: 24 mg/dL — ABNORMAL HIGH (ref 8–23)
BUN: 25 mg/dL — ABNORMAL HIGH (ref 8–23)
CO2: 21 mmol/L — ABNORMAL LOW (ref 22–32)
CO2: 23 mmol/L (ref 22–32)
Calcium: 7.4 mg/dL — ABNORMAL LOW (ref 8.9–10.3)
Calcium: 7.6 mg/dL — ABNORMAL LOW (ref 8.9–10.3)
Chloride: 107 mmol/L (ref 98–111)
Chloride: 111 mmol/L (ref 98–111)
Creatinine, Ser: 1.2 mg/dL (ref 0.61–1.24)
Creatinine, Ser: 1.06 mg/dL (ref 0.61–1.24)
GFR, Estimated: >60 mL/min (ref >60)
GFR, Estimated: >60 mL/min (ref >60)
Glucose, Bld: 122 mg/dL — ABNORMAL HIGH (ref 70–99)
Glucose, Bld: 133 mg/dL — ABNORMAL HIGH (ref 70–99)
Potassium: 4.5 mmol/L (ref 3.5–5.1)
Potassium: 4.3 mmol/L (ref 3.5–5.1)
Sodium: 137 mmol/L (ref 135–145)
Sodium: 139 mmol/L (ref 135–145)

## 2021-03-09 LAB — GLUCOSE, CAPILLARY
Glucose-Capillary: 103 mg/dL — ABNORMAL HIGH (ref 70–99)
Glucose-Capillary: 106 mg/dL — ABNORMAL HIGH (ref 70–99)
Glucose-Capillary: 117 mg/dL — ABNORMAL HIGH (ref 70–99)
Glucose-Capillary: 121 mg/dL — ABNORMAL HIGH (ref 70–99)
Glucose-Capillary: 122 mg/dL — ABNORMAL HIGH (ref 70–99)
Glucose-Capillary: 123 mg/dL — ABNORMAL HIGH (ref 70–99)
Glucose-Capillary: 126 mg/dL — ABNORMAL HIGH (ref 70–99)
Glucose-Capillary: 126 mg/dL — ABNORMAL HIGH (ref 70–99)
Glucose-Capillary: 127 mg/dL — ABNORMAL HIGH (ref 70–99)
Glucose-Capillary: 127 mg/dL — ABNORMAL HIGH (ref 70–99)
Glucose-Capillary: 129 mg/dL — ABNORMAL HIGH (ref 70–99)
Glucose-Capillary: 138 mg/dL — ABNORMAL HIGH (ref 70–99)
Glucose-Capillary: 139 mg/dL — ABNORMAL HIGH (ref 70–99)
Glucose-Capillary: 140 mg/dL — ABNORMAL HIGH (ref 70–99)
Glucose-Capillary: 140 mg/dL — ABNORMAL HIGH (ref 70–99)
Glucose-Capillary: 141 mg/dL — ABNORMAL HIGH (ref 70–99)
Glucose-Capillary: 147 mg/dL — ABNORMAL HIGH (ref 70–99)

## 2021-03-09 LAB — CBC
HCT: 27.1 % — ABNORMAL LOW (ref 39.0–52.0)
HCT: 25.1 % — ABNORMAL LOW (ref 39.0–52.0)
Hemoglobin: 9.2 g/dL — ABNORMAL LOW (ref 13.0–17.0)
Hemoglobin: 8 g/dL — ABNORMAL LOW (ref 13.0–17.0)
MCH: 30.7 pg (ref 26.0–34.0)
MCH: 29.3 pg (ref 26.0–34.0)
MCHC: 33.9 g/dL (ref 30.0–36.0)
MCHC: 31.9 g/dL (ref 30.0–36.0)
MCV: 90.3 fL (ref 80.0–100.0)
MCV: 91.9 fL (ref 80.0–100.0)
Platelets: 196 10*3/uL (ref 150–400)
Platelets: 137 10*3/uL — ABNORMAL LOW (ref 150–400)
RBC: 3 MIL/uL — ABNORMAL LOW (ref 4.22–5.81)
RBC: 2.73 MIL/uL — ABNORMAL LOW (ref 4.22–5.81)
RDW: 12.6 % (ref 11.5–15.5)
RDW: 13 % (ref 11.5–15.5)
WBC: 17.4 10*3/uL — ABNORMAL HIGH (ref 4.0–10.5)
WBC: 11.1 10*3/uL — ABNORMAL HIGH (ref 4.0–10.5)
nRBC: 0 % (ref 0.0–0.2)
nRBC: 0 % (ref 0.0–0.2)

## 2021-03-09 LAB — MAGNESIUM
Magnesium: 2.9 mg/dL — ABNORMAL HIGH (ref 1.7–2.4)
Magnesium: 2.6 mg/dL — ABNORMAL HIGH (ref 1.7–2.4)

## 2021-03-09 LAB — COOXEMETRY PANEL
Carboxyhemoglobin: 0.8 % (ref 0.5–1.5)
Methemoglobin: 1.4 % (ref 0.0–1.5)
O2 Saturation: 57.9 %
Total hemoglobin: 9 g/dL — ABNORMAL LOW (ref 12.0–16.0)

## 2021-03-09 MED ORDER — LEVALBUTEROL HCL 0.63 MG/3ML IN NEBU: 0.6300 mg | INHALATION_SOLUTION | RESPIRATORY_TRACT | Status: DC | PRN

## 2021-03-09 MED ORDER — ACETAMINOPHEN 650 MG RE SUPP
975.0000 mg | Freq: Four times a day (QID) | RECTAL | Status: DC
  Administered 2021-03-09 – 2021-03-10 (×2): 975 mg via RECTAL
  Filled 2021-03-09 (×2): qty 1

## 2021-03-09 NOTE — Progress Notes (Signed)
NIF -23 VC 1.0 pt tol well placed on 4lt [place] SATS 96% HR 80 IS 750x4

## 2021-03-09 NOTE — Plan of Care (Signed)
Shift Summary:  Neuro intact, follows commands and communicates by writing on a clipboard.  Pain well-controlled with PRN morphine and precedex gtt at 0.7.  Occasionally anxious requiring PRN midazolam.  Tolerating wean of nitric oxide with spO2 > 95% and pO2 > 60.  Minimal thin white secretions.  Remains DDD at 68, underlying SB with occasional pauses.  Milrinone gtt maintained at 0.25, epi gtt weaned to 1 mcg with CI > 2.2 and MAP > 65, no further wean until after extubation per Dr. Kipp Brood.  UOP > 50 ml/hr.  Minimal serosanguinous chest tube output.  Plan: continue to wean nitric to off, then wean PEEP, then proceed with SIMV rapid wean protocol.  No OGT placed per Dr. Kipp Brood.

## 2021-03-09 NOTE — Progress Notes (Signed)
Progress Note  Patient Name: Michael Valenzuela Date of Encounter: 03/09/2021  [facility] Cardiologist: Lives in [place].  May benefit from [facility] clinic  Subjective   Intubated, sedate postoperative mitral valve replacement  Inpatient Medications    Scheduled Meds: . acetaminophen  1,000 mg Oral Q6H   Or  . acetaminophen (TYLENOL) oral liquid 160 mg/5 mL  1,000 mg Per Tube Q6H  . aspirin EC  325 mg Oral Daily   Or  . aspirin  324 mg Per Tube Daily  . bisacodyl  10 mg Oral Daily   Or  . bisacodyl  10 mg Rectal Daily  . chlorhexidine gluconate (MEDLINE KIT)  15 mL Mouth Rinse BID  . Chlorhexidine Gluconate Cloth  6 each Topical Daily  . docusate sodium  200 mg Oral Daily  . levalbuterol  0.63 mg Nebulization Q6H  . mouth rinse  15 mL Mouth Rinse 10 times per day  . [START ON 03/10/2021] pantoprazole  40 mg Oral Daily  . rosuvastatin  10 mg Oral Daily  . sodium chloride flush  10-40 mL Intracatheter Q12H  . sodium chloride flush  3 mL Intravenous Q12H   Continuous Infusions: . sodium chloride Stopped (03/09/21 0741)  . sodium chloride    . sodium chloride 10 mL/hr at 03/09/21 0532  . cefUROXime (ZINACEF)  IV 200 mL/hr at 03/09/21 0800  . dexmedetomidine (PRECEDEX) IV infusion 0.7 mcg/kg/hr (03/09/21 0800)  . electrolyte-A 75 mL/hr at 03/09/21 0800  . epinephrine 3 mcg/min (03/09/21 0800)  . insulin 3 mL/hr at 03/09/21 0800  . lactated ringers    . lactated ringers    . lactated ringers 20 mL/hr at 03/09/21 0800  . milrinone 0.25 mcg/kg/min (03/09/21 0800)  . nitroGLYCERIN Stopped (03/08/21 1641)  . phenylephrine (NEO-SYNEPHRINE) Adult infusion Stopped (03/09/21 0646)   PRN Meds: sodium chloride, dextrose, lactated ringers, metoprolol tartrate, midazolam, morphine injection, ondansetron (ZOFRAN) IV, oxyCODONE, sodium chloride flush, sodium chloride flush, traMADol   Vital Signs    Vitals:   03/09/21 0700 03/09/21 0800 03/09/21 0804 03/09/21 0900  BP: 97/70  101/71  98/74  Pulse:      Resp: 14 16  16   Temp: 98.96 F (37.2 C) 98.78 F (37.1 C)  98.96 F (37.2 C)  TempSrc:  Core    SpO2: 97% 98% 99% 96%  Weight:      Height:        Intake/Output Summary (Last 24 hours) at 03/09/2021 0904 Last data filed at 03/09/2021 0800 Gross per 24 hour  Intake 7636.91 ml  Output 2880 ml  Net 4756.91 ml   Last 3 Weights 03/09/2021 03/08/2021 03/07/2021  Weight (lbs) 203 lb 14.8 oz 185 lb 14.4 oz 188 lb 9.6 oz  Weight (kg) 92.5 kg 84.324 kg 85.548 kg      Telemetry    AV paced- Personally Reviewed  ECG    AV paced 92 bpm- Personally Reviewed  Physical Exam   GEN:  Sedate, ventilated Neck:  ET tube in place Cardiac: RRR, no murmurs, rubs, or gallops.  Respiratory:  Vent noise bilaterally. GI: Soft, nontender, non-distended  MS:  1+ edema; No deformity. Neuro:   Unable Psych: Unable  Labs    High Sensitivity Troponin:   Recent Labs  Lab 03/01/21 1343 03/01/21 1602  TROPONINIHS 11 11      Chemistry Recent Labs  Lab 03/08/21 0436 03/08/21 0758 03/08/21 1334 03/08/21 1338 03/08/21 2030 03/09/21 0327 03/09/21 0334  NA 134*   < >  139   < > 134* 137 140  K 4.3   < > 4.1   < > 4.0 4.5 4.5  CL 101   < > 106  --  107 107  --   CO2 24  --   --   --  20* 21*  --   GLUCOSE 152*   < > 123*  --  134* 122*  --   BUN 23   < > 23  --  24* 24*  --   CREATININE 0.97   < > 0.70  --  1.10 1.20  --   CALCIUM 9.1  --   --   --  7.0* 7.4*  --   PROT 6.2*  --   --   --   --   --   --   ALBUMIN 3.3*  --   --   --   --   --   --   AST 22  --   --   --   --   --   --   ALT 21  --   --   --   --   --   --   ALKPHOS 38  --   --   --   --   --   --   BILITOT 0.9  --   --   --   --   --   --   GFRNONAA >60  --   --   --  >60 >60  --   ANIONGAP 9  --   --   --  7 9  --    < > = values in this interval not displayed.     Hematology Recent Labs  Lab 03/08/21 1509 03/08/21 1510 03/08/21 2030 03/09/21 0327 03/09/21 0334  WBC 23.3*  --   22.9* 17.4*  --   RBC 3.79*  --  3.46* 3.00*  --   HGB 11.4*   < > 10.4* 9.2* 9.5*  HCT 34.0*   < > 30.7* 27.1* 28.0*  MCV 89.7  --  88.7 90.3  --   MCH 30.1  --  30.1 30.7  --   MCHC 33.5  --  33.9 33.9  --   RDW 12.4  --  12.6 12.6  --   PLT 210  --  225 196  --    < > = values in this interval not displayed.    BNPNo results for input(s): BNP, PROBNP in the last 168 hours.   DDimer No results for input(s): DDIMER in the last 168 hours.   Radiology    DG Chest Port 1 View  Result Date: 03/09/2021 CLINICAL DATA:  Status post mitral valve repair EXAM: PORTABLE CHEST 1 VIEW COMPARISON:  March 08, 2021 FINDINGS: The ETT is in good position. The PA catheter is stable terminating in the pulmonary outflow tract. Left chest tubes are stable. No pneumothorax. Stable cardiomegaly. The hila and mediastinum are unchanged. Small bilateral layering effusions with underlying atelectasis. Mild pulmonary venous congestion without overt edema. No other acute abnormalities. IMPRESSION: 1. Support apparatus as above. 2. Mild pulmonary venous congestion without overt edema. 3. Small bilateral pleural effusions with underlying atelectasis, new in the interval. Electronically Signed   By: Dorise Bullion III M.D   On: 03/09/2021 09:00   DG Chest Port 1 View  Result Date: 03/08/2021 CLINICAL DATA:  Cardiac surgery EXAM: PORTABLE CHEST 1 VIEW COMPARISON:  03/03/2021 FINDINGS: Interval postsurgical changes  within the knee chest including cardiac valve replacement, atrial appendage clip, and median sternotomy. Right IJ approach Swan-Ganz catheter terminates at the level of the proximal pulmonary outflow. Bilateral chest tubes and mediastinal drain in place. Endotracheal tube terminates 5.5 cm above the carina. Stable cardiomegaly. Atherosclerotic calcification of the aortic knob. Streaky right perihilar opacity. No appreciable pleural fluid collection. No pneumothorax. IMPRESSION: 1. Interval postsurgical changes  within the chest with support apparatus, as above. No acute findings. 2. Streaky right perihilar opacity, likely atelectasis. Electronically Signed   By: Davina Poke D.O.   On: 03/08/2021 15:47    Cardiac Studies   Cath- no significant coronary disease Echo- severe mitral regurgitation, EF 75%  Patient Profile     65 y.o. male with newly discovered severe mitral regurgitation, acute diastolic heart failure in this setting, no significant coronary artery disease on preop catheterization who underwent mitral valve replacement, Dr. Orvan Seen  Assessment & Plan    Severe mitral regurgitation/mitral valve replacement - Currently on vent weaning protocol postop.  Epinephrine, milrinone IV for support.  50% FiO2.  Insulin drip.  PA pressure 25 over 17 mmHg.  Cardiac index 2.8 at last check.  Improved.  Continue with supportive care.  Creatinine 1.2, hemoglobin 9.5  For questions or updates, please contact [place] Please consult [email or web address] for contact info under        Signed, Candee Furbish, MD  03/09/2021, 9:04 AM

## 2021-03-09 NOTE — Progress Notes (Signed)
[address]       [place] [address]             [phone]                 1 Day Post-Op Procedure(s) (LRB): MITRAL VALVE REPAIR USING  PHYSIO RING SIZE 28 (EXPLANTED) THEN MITRAL VALVE REPLACEMENT (MVR) WITH MAGNA EASE MITRAL VALVE SIZE 25MM  ON PUMP, LEFT ATRIAL APPENDAGE CLIPPING USING A SIZE 50 ATRIAL CLIP (N/A) MAZE (N/A) TRANSESOPHAGEAL ECHOCARDIOGRAM (TEE) (N/A)   Events: No events overinght _______________________________________________________________ Vitals: BP (!) 86/72   Pulse 90   Temp 99.32 F (37.4 C)   Resp (!) 21   Ht 6' (1.829 m)   Wt 92.5 kg   SpO2 94%   BMI 27.66 kg/m   - Neuro: sedated  - Cardiovascular: sinus in 60s  Drips: milr 0.25, epi 1.   PAP: (20-45)/(15-32) 33/22 CO:  [2.6 L/min-5.8 L/min] 5.3 L/min CI:  [1.3 L/min/m2-2.8 L/min/m2] 2.6 L/min/m2  - Pulm:  Vent Mode: SIMV;PRVC;PSV FiO2 (%):  [50 %-100 %] 50 % Set Rate:  [12 bmp-16 bmp] 16 bmp Vt Set:  [620 mL-750 mL] 620 mL PEEP:  [5 cmH20-12 cmH20] 8 cmH20 Pressure Support:  [10 cmH20] 10 cmH20 Plateau Pressure:  [20 cmH20-22 cmH20] 20 cmH20  ABG    Component Value Date/Time   PHART 7.351 03/09/2021 0334   PCO2ART 39.7 03/09/2021 0334   PO2ART 234 (H) 03/09/2021 0334   HCO3 21.8 03/09/2021 0334   TCO2 23 03/09/2021 0334   ACIDBASEDEF 3.0 (H) 03/09/2021 0334   O2SAT 100.0 03/09/2021 0334    - Abd: soft - Extremity: warm  .Intake/Output      04/29 0701 04/30 0700 04/30 0701 05/01 0700   P.O. 0    I.V. (mL/kg) 5958 (64.4) 637.7 (6.9)   Blood 560    Other 0    IV Piggyback 1741.9 100.1   Total Intake(mL/kg) 8259.8 (89.3) 737.8 (8)   Urine (mL/kg/hr) 1490 (0.7) 130 (0.3)   Blood 960    Chest Tube 730 60   Total Output 3180 190   Net +5079.8 +547.8           _______________________________________________________________ Labs: CBC Latest Ref Rng & Units 03/09/2021 03/09/2021 03/08/2021  WBC 4.0 - 10.5 K/uL - 17.4(H) 22.9(H)  Hemoglobin  13.0 - 17.0 g/dL 9.5(L) 9.2(L) 10.4(L)  Hematocrit 39.0 - 52.0 % 28.0(L) 27.1(L) 30.7(L)  Platelets 150 - 400 K/uL - 196 225   CMP Latest Ref Rng & Units 03/09/2021 03/09/2021 03/08/2021  Glucose 70 - 99 mg/dL - 122(H) 134(H)  BUN 8 - 23 mg/dL - 24(H) 24(H)  Creatinine 0.61 - 1.24 mg/dL - 1.20 1.10  Sodium 135 - 145 mmol/L 140 137 134(L)  Potassium 3.5 - 5.1 mmol/L 4.5 4.5 4.0  Chloride 98 - 111 mmol/L - 107 107  CO2 22 - 32 mmol/L - 21(L) 20(L)  Calcium 8.9 - 10.3 mg/dL - 7.4(L) 7.0(L)  Total Protein 6.5 - 8.1 g/dL - - -  Total Bilirubin 0.3 - 1.2 mg/dL - - -  Alkaline Phos 38 - 126 U/L - - -  AST 15 - 41 U/L - - -  ALT 0 - 44 U/L - - -    CXR: PV congestion  _______________________________________________________________  Assessment and Plan: POD 1 s/p MVRpl, atriclip  Neuro: awake, on minimal sedation CV: weaning gtts.  Will get iNO off first.  Pacemaker set to back up rate  Pulm: weaning iNO, will plan to extubate following after Renal: creat stable GI: NPO for now Heme: stable ID: [email or web address] Endo: on insulin gtt Dispo: continue ICU care   [place] 03/09/2021 11:02 AM

## 2021-03-09 NOTE — Progress Notes (Signed)
PROGRESS NOTE    Michael Valenzuela  KCL:[id number] DOB: 08-Jun-1956 DOA: 03/01/2021 PCP: Chevis Pretty, FNP   Brief Narrative:  HPI On 03/01/2021 by Dr. Loma Boston Michael Valenzuela is a 65 y.o. male with a history of diabetes, fibromyalgia, hyperlipidemia and hypertension.  Patient presents with 3 to 4 days pleurisy and orthopnea.  His symptoms have been worsening with past couple of days.  He has been having increasing difficulty sleeping due to the pleurisy and presented to urgent care for evaluation.  EKG was done and the patient was found to be in atrial fibrillation.  He was sent to the hospital for further evaluation.  Normal here, he was in sinus rhythm, but then converted into atrial fibrillation.  He has been rate controlled.  Patient has been mildly hypoxic, which improved with oxygen via nasal cannula.  No palliating or provoking factors.  Denies fevers, chills, nausea, vomiting.  Denies chest pain  Interim history Patient was admitted with acute hypoxic respiratory failure as well as heart failure exacerbation.  Initially was admitted to [facility] but then transferred to [facility].  Patient was placed on diuretics as well as nebulizers and steroids for possible bronchitis.  Echocardiogram showed moderate to severe MR.  Cardiology consulted and appreciated.  TEE showed severe mitral valve regurgitation.  Right and left heart cath performed.  TCTS consulted, surgery planned today. Assessment & Plan   Acute hypoxic respiratory failure -Possibly multifactorial including CHF exacerbation, new diagnosis of atrial fibrillation, severe mitral valve regurgitation -oxygen saturations 87-89% on room air upon presentation and was placed on 4L O2 -Patient was weaned to room air, however, currently intubated post procedure -CTA showed no PE  Atrial fibrillation, new onset -Eliquis currently held -Continue full dose Lovenox  Mitral regurgitation -TEE showed an EF of 60 to 65%.  LA size  severely dilated.  No atrial appendage thrombus detected.  Mitral valve is abnormal.  Flail P2.  Severe mitral valve regurgitation.  Severe MR. -Cardiothoracic surgery consulted and appreciated and feels patient would be a good candidate for mitral valve replacement or repair with biatrial ablation.   -Unable to reach dentistry -Mitral valve repair/replacement planned for today -Suspect patient will be admitted to ICU post-op -Currently on several drips-from, milrinone, insulin drips -Discussed with CT surgery, kindly agreed to take over care of the patient  Acute diastolic CHF exacerbation -Echocardiogram showed an EF of 70-75%, no regional motion abnormalities.  LV diastolic function could not be evaluated. -CTA chest showed moderate bilateral pleural effusions, small septal thickening consistent with pulmonary edema.  -Monitor intake and output, daily weights -was on IV Lasix 40 mg daily  Concern for bronchitis -Noted on CTA: Peribronchial groundglass opacities in the right perihilar lung and right upper lobe. Bronchial wall thickening-bronchitis or congestion. -Patient may need repeat imaging for follow-up of resolution -Completed steroids  Hyperlipidemia -Continue statin, fenofibrate  Diabetes mellitus, type II -Metformin and Januvia held -Continue Levemir, insulin sliding scale, CBG monitoring  Essential hypertension -Continue amlodipine, Lasix  DVT Prophylaxis Lovenox  Code Status: Full  Family Communication: None at bedside  Disposition Plan:  Status is: Inpatient  Remains inpatient appropriate because:IV treatments appropriate due to intensity of illness or inability to take PO and Inpatient level of care appropriate due to severity of illness   Dispo: The patient is from: Home              Anticipated d/c is to: Home  Patient currently is not medically stable to d/c.   Difficult to place patient No  Consultants Cardiology Cardiothoracic  surgery  Procedures  Echocardiogram TEE  Antibiotics   Anti-infectives (From admission, onward)   Start     Dose/Rate Route Frequency Ordered Stop   03/08/21 2130  vancomycin (VANCOCIN) IVPB 1000 mg/200 mL premix        1,000 mg 200 mL/hr over 60 Minutes Intravenous  Once 03/08/21 1449 03/08/21 2232   03/08/21 2000  cefUROXime (ZINACEF) 1.5 g in sodium chloride 0.9 % 100 mL IVPB        1.5 g 200 mL/hr over 30 Minutes Intravenous Every 12 hours 03/08/21 1449 03/10/21 1959   03/08/21 0844  vancomycin (VANCOCIN) powder  Status:  Discontinued          As needed 03/08/21 0844 03/08/21 1449   03/08/21 0400  vancomycin (VANCOREADY) IVPB 1500 mg/300 mL        1,500 mg 150 mL/hr over 120 Minutes Intravenous To Surgery 03/07/21 0720 03/08/21 0800   03/08/21 0400  cefUROXime (ZINACEF) 1.5 g in sodium chloride 0.9 % 100 mL IVPB        1.5 g 200 mL/hr over 30 Minutes Intravenous To Surgery 03/07/21 0720 03/08/21 1900   03/08/21 0400  cefUROXime (ZINACEF) 750 mg in sodium chloride 0.9 % 100 mL IVPB        750 mg 200 mL/hr over 30 Minutes Intravenous To Surgery 03/07/21 0720 03/08/21 1900      Subjective:   Michael Valenzuela was seen and examined. Discussion limited as patient currently intubated. Denies pain or shortness of breath at this time   Objective:   Vitals:   03/09/21 0645 03/09/21 0700 03/09/21 0800 03/09/21 0804  BP: 108/72 97/70 101/71   Pulse:      Resp: 16 14 16    Temp: 98.96 F (37.2 C) 98.96 F (37.2 C) 98.78 F (37.1 C)   TempSrc:   Core   SpO2: 95% 97% 98% 99%  Weight:      Height:        Intake/Output Summary (Last 24 hours) at 03/09/2021 0854 Last data filed at 03/09/2021 0800 Gross per 24 hour  Intake 7636.91 ml  Output 3230 ml  Net 4406.91 ml   Filed Weights   03/07/21 0211 03/08/21 0504 03/09/21 0500  Weight: 85.5 kg 84.3 kg 92.5 kg   Exam  General: Well developed, chronically ill appearing, sedated, NAD  HEENT: NCAT, mucous membranes moist.  ETT  Cardiovascular: S1 S2 auscultated, no rubs, murmurs or gallops. Regular rate and rhythm.  Respiratory: diminished breath sounds, on vent  Abdomen: Soft, nontender, nondistended, + bowel sounds  Extremities: warm dry without cyanosis clubbing. LE edema  Neuro: Awake and alert, exam limited as patient intubated. Moves extremities    Data Reviewed: I have personally reviewed following labs and imaging studies  CBC: Recent Labs  Lab 03/06/21 0644 03/08/21 0436 03/08/21 0758 03/08/21 1246 03/08/21 1334 03/08/21 1509 03/08/21 1510 03/08/21 1643 03/08/21 1846 03/08/21 2030 03/09/21 0327 03/09/21 0334  WBC 9.8 12.7*  --   --   --  23.3*  --   --   --  22.9* 17.4*  --   HGB 14.1 13.4   < > 8.8*   < > 11.4*   < > 10.5* 10.2* 10.4* 9.2* 9.5*  HCT 42.9 40.2   < > 25.9*   < > 34.0*   < > 31.0* 30.0* 30.7* 27.1* 28.0*  MCV 90.1 89.1  --   --   --  89.7  --   --   --  88.7 90.3  --   PLT 351 313  --  207  --  210  --   --   --  225 196  --    < > = values in this interval not displayed.   Basic Metabolic Panel: Recent Labs  Lab 03/06/21 0644 03/07/21 0336 03/08/21 0436 03/08/21 0758 03/08/21 1140 03/08/21 1209 03/08/21 1241 03/08/21 1334 03/08/21 1338 03/08/21 1643 03/08/21 1846 03/08/21 2030 03/09/21 0327 03/09/21 0334  NA 136 134* 134*   < > 137   < > 138 139   < > 141 143 134* 137 140  K 4.5 4.0 4.3   < > 4.2   < > 4.4 4.1   < > 5.1 3.9 4.0 4.5 4.5  CL 104 101 101   < > 105  --  105 106  --   --   --  107 107  --   CO2 25 24 24   --   --   --   --   --   --   --   --  20* 21*  --   GLUCOSE 150* 144* 152*   < > 132*  --  128* 123*  --   --   --  134* 122*  --   BUN 30* 29* 23   < > 23  --  22 23  --   --   --  24* 24*  --   CREATININE 1.02 0.90 0.97   < > 0.70  --  0.60* 0.70  --   --   --  1.10 1.20  --   CALCIUM 8.9 8.7* 9.1  --   --   --   --   --   --   --   --  7.0* 7.4*  --   MG 2.3  --   --   --   --   --   --   --   --   --   --  3.5* 2.9*  --   PHOS 3.3   --   --   --   --   --   --   --   --   --   --   --   --   --    < > = values in this interval not displayed.   GFR: Estimated Creatinine Clearance: 67.4 mL/min (by C-G formula based on SCr of 1.2 mg/dL). Liver Function Tests: Recent Labs  Lab 03/08/21 0436  AST 22  ALT 21  ALKPHOS 38  BILITOT 0.9  PROT 6.2*  ALBUMIN 3.3*   No results for input(s): LIPASE, AMYLASE in the last 168 hours. No results for input(s): AMMONIA in the last 168 hours. Coagulation Profile: Recent Labs  Lab 03/07/21 1738 03/08/21 1509  INR 1.0 1.6*   Cardiac Enzymes: No results for input(s): CKTOTAL, CKMB, CKMBINDEX, TROPONINI in the last 168 hours. BNP (last 3 results) No results for input(s): PROBNP in the last 8760 hours. HbA1C: No results for input(s): HGBA1C in the last 72 hours. CBG: Recent Labs  Lab 03/09/21 0324 03/09/21 0520 03/09/21 0615 03/09/21 0701 03/09/21 0802  GLUCAP 126* 106* 147* 140* 123*   Lipid Profile: No results for input(s): CHOL, HDL, LDLCALC, TRIG, CHOLHDL, LDLDIRECT in the last 72 hours. Thyroid Function Tests: No results for input(s): TSH, T4TOTAL, FREET4, T3FREE, THYROIDAB in the last 72 hours. Anemia Panel: No results  for input(s): VITAMINB12, FOLATE, FERRITIN, TIBC, IRON, RETICCTPCT in the last 72 hours. Urine analysis:    Component Value Date/Time   COLORURINE YELLOW 03/07/2021 0757   APPEARANCEUR HAZY (A) 03/07/2021 0757   LABSPEC 1.023 03/07/2021 0757   PHURINE 6.0 03/07/2021 0757   GLUCOSEU NEGATIVE 03/07/2021 0757   HGBUR NEGATIVE 03/07/2021 0757   BILIRUBINUR NEGATIVE 03/07/2021 0757   KETONESUR NEGATIVE 03/07/2021 0757   PROTEINUR NEGATIVE 03/07/2021 0757   NITRITE NEGATIVE 03/07/2021 0757   LEUKOCYTESUR NEGATIVE 03/07/2021 0757   Sepsis Labs: @LABRCNTIP (procalcitonin:4,lacticidven:4)  ) Recent Results (from the past 240 hour(s))  Resp Panel by RT-PCR (Flu A&B, Covid) Nasopharyngeal Swab     Status: None   Collection Time: 03/01/21  3:05  PM   Specimen: Nasopharyngeal Swab; Nasopharyngeal(NP) swabs in vial transport medium  Result Value Ref Range Status   SARS Coronavirus 2 by RT PCR NEGATIVE NEGATIVE Final    Comment: (NOTE) SARS-CoV-2 target nucleic acids are NOT DETECTED.  The SARS-CoV-2 RNA is generally detectable in upper respiratory specimens during the acute phase of infection. The lowest concentration of SARS-CoV-2 viral copies this assay can detect is 138 copies/mL. A negative result does not preclude SARS-Cov-2 infection and should not be used as the sole basis for treatment or other patient management decisions. A negative result may occur with  improper specimen collection/handling, submission of specimen other than nasopharyngeal swab, presence of viral mutation(s) within the areas targeted by this assay, and inadequate number of viral copies(<138 copies/mL). A negative result must be combined with clinical observations, patient history, and epidemiological information. The expected result is Negative.  Fact Sheet for Patients:  EntrepreneurPulse.com.au  Fact Sheet for Healthcare Providers:  IncredibleEmployment.be  This test is no t yet approved or cleared by the [place] FDA and  has been authorized for detection and/or diagnosis of SARS-CoV-2 by FDA under an Emergency Use Authorization (EUA). This EUA will remain  in effect (meaning this test can be used) for the duration of the COVID-19 declaration under Section 564(b)(1) of the Act, 21 U.S.C.section 360bbb-3(b)(1), unless the authorization is terminated  or revoked sooner.       Influenza A by PCR NEGATIVE NEGATIVE Final   Influenza B by PCR NEGATIVE NEGATIVE Final    Comment: (NOTE) The Xpert Xpress SARS-CoV-2/FLU/RSV plus assay is intended as an aid in the diagnosis of influenza from Nasopharyngeal swab specimens and should not be used as a sole basis for treatment. Nasal washings and aspirates are  unacceptable for Xpert Xpress SARS-CoV-2/FLU/RSV testing.  Fact Sheet for Patients: EntrepreneurPulse.com.au  Fact Sheet for Healthcare Providers: IncredibleEmployment.be  This test is not yet approved or cleared by the [place] FDA and has been authorized for detection and/or diagnosis of SARS-CoV-2 by FDA under an Emergency Use Authorization (EUA). This EUA will remain in effect (meaning this test can be used) for the duration of the COVID-19 declaration under Section 564(b)(1) of the Act, 21 U.S.C. section 360bbb-3(b)(1), unless the authorization is terminated or revoked.  Performed at [facility], [address], [place], [place] [address]   Surgical pcr screen     Status: None   Collection Time: 03/07/21  7:56 AM   Specimen: Nasal Mucosa; Nasal Swab  Result Value Ref Range Status   MRSA, PCR NEGATIVE NEGATIVE Final   Staphylococcus aureus NEGATIVE NEGATIVE Final    Comment: (NOTE) The Xpert SA Assay (FDA approved for NASAL specimens in patients 5 years of age and older), is one component of a comprehensive  surveillance program. It is not intended to diagnose infection nor to guide or monitor treatment. Performed at [facility], [place] [address] [place], [place] [address]       Radiology Studies: DG Chest Port 1 View  Result Date: 03/08/2021 CLINICAL DATA:  Cardiac surgery EXAM: PORTABLE CHEST 1 VIEW COMPARISON:  03/03/2021 FINDINGS: Interval postsurgical changes within the knee chest including cardiac valve replacement, atrial appendage clip, and median sternotomy. Right IJ approach Swan-Ganz catheter terminates at the level of the proximal pulmonary outflow. Bilateral chest tubes and mediastinal drain in place. Endotracheal tube terminates 5.5 cm above the carina. Stable cardiomegaly. Atherosclerotic calcification of the aortic knob. Streaky right perihilar opacity. No appreciable pleural fluid collection. No pneumothorax.  IMPRESSION: 1. Interval postsurgical changes within the chest with support apparatus, as above. No acute findings. 2. Streaky right perihilar opacity, likely atelectasis. Electronically Signed   By: Davina Poke D.O.   On: 03/08/2021 15:47     Scheduled Meds: . acetaminophen  1,000 mg Oral Q6H   Or  . acetaminophen (TYLENOL) oral liquid 160 mg/5 mL  1,000 mg Per Tube Q6H  . aspirin EC  325 mg Oral Daily   Or  . aspirin  324 mg Per Tube Daily  . bisacodyl  10 mg Oral Daily   Or  . bisacodyl  10 mg Rectal Daily  . chlorhexidine gluconate (MEDLINE KIT)  15 mL Mouth Rinse BID  . Chlorhexidine Gluconate Cloth  6 each Topical Daily  . docusate sodium  200 mg Oral Daily  . levalbuterol  0.63 mg Nebulization Q6H  . mouth rinse  15 mL Mouth Rinse 10 times per day  . [START ON 03/10/2021] pantoprazole  40 mg Oral Daily  . rosuvastatin  10 mg Oral Daily  . sodium chloride flush  10-40 mL Intracatheter Q12H  . sodium chloride flush  3 mL Intravenous Q12H   Continuous Infusions: . sodium chloride Stopped (03/09/21 0741)  . sodium chloride    . sodium chloride 10 mL/hr at 03/09/21 0532  . cefUROXime (ZINACEF)  IV 200 mL/hr at 03/09/21 0800  . dexmedetomidine (PRECEDEX) IV infusion 0.7 mcg/kg/hr (03/09/21 0800)  . electrolyte-A 75 mL/hr at 03/09/21 0800  . epinephrine 3 mcg/min (03/09/21 0800)  . insulin 3 mL/hr at 03/09/21 0800  . lactated ringers    . lactated ringers    . lactated ringers 20 mL/hr at 03/09/21 0800  . milrinone 0.25 mcg/kg/min (03/09/21 0800)  . nitroGLYCERIN Stopped (03/08/21 1641)  . phenylephrine (NEO-SYNEPHRINE) Adult infusion Stopped (03/09/21 0646)     LOS: 8 days   Time Spent in minutes   25 minutes  Ketzia Guzek D.O. on 03/09/2021 at 8:54 AM  Between 7am to 7pm - Please see pager noted on [email or web address]  After 7pm go to [email or web address]  And look for the night coverage person covering for me after hours  [facility] Office  [phone]

## 2021-03-09 NOTE — Procedures (Signed)
Extubation Procedure Note  Patient Details:   Name: Michael Valenzuela DOB: [birth year] MRN: [id number]   Airway Documentation:  Airway 8 mm (Active)  Secured at (cm) 23 cm 03/09/21 2100  Measured From Lips 03/09/21 2100  Secured Location Left 03/09/21 2100  Secured By [employer] 03/09/21 2100  Tube Holder Repositioned Yes 03/09/21 2100  Prone position No 03/08/21 1500  Cuff Pressure (cm H2O) Green OR 18-26 [facility] 03/09/21 2100  Site Condition Dry 03/09/21 2100   Vent end date: 03/09/21 Vent end time: 2334   Evaluation  O2 sats: stable throughout Complications: No apparent complications Patient did tolerate procedure well. Bilateral Breath Sounds: Clear,Diminished   Yes  Chriss Driver [facility] 03/09/2021, 11:37 PM

## 2021-03-09 NOTE — Consult Note (Signed)
Department of Dental Medicine     INPATIENT CONSULTATION  Service Date:   03/09/2021 Admitted Date:  03/01/2021  Patient Name:  Michael Valenzuela Date of Birth:   1956/02/08 Medical Record Number: [id number]  Referring Provider:              Fredrich Romans, MD  PLAN & RECOMMENDATIONS   > There are no current signs of acute odontogenic infection including abscess, edema or erythema, or suspicious lesion requiring biopsy.  The patient does have multiple teeth with severe decay, retained root tips that are non-restorable, cavities and periodontal concerns. >> Recommend extractions of all indicated teeth to decrease the risk of perioperative and postoperative systemic infection and complications following his cardiac surgery and recovery.  As soon as he is optimized from a medical standpoint, plan to see the patient in our clinic to address the more urgent concerns and subsequently have the patient establish care at an outside dental office for routine care. >>> Plan to discuss case with medical team and coordinate treatment as needed.  If patient is able to have teeth extracted under general anesthesia, we will schedule them for the OR after his heart surgery and outpatient appointment in our clinic, pending medical team's recommendations.  >>  Discussed in detail all treatment options with the patient and they are agreeable to the plan.   Thank you for consulting with Hospital Dentistry and for the opportunity to participate in this patient's treatment.  Should you have any questions or concerns, please contact the [facility] at [phone].   03/09/2021     CONSULT NOTE   COVID 19 SCREENING: The patient denies symptoms concerning for COVID-19 infection including fever, chills, cough, or newly developed shortness of breath.   HISTORY OF PRESENT ILLNESS: >> Michael Valenzuela is a very pleasant 65 y.o. male with h/o diabetes, fibromyalgia, hyperlipidemia and HTN who is currently admitted  for exacerbation of CHF and mitral regurgitation, concern for bronchitis, new onset atrial fibrillation and acute hypoxic respiratory failure anticipating CABG/MVR on 4/29.  Hospital dentistry was consulted to evaluate the patient as part of their medically necessary pre-cardiac surgery work-up.  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  He reports understanding the importance of finding a dentist to maintain his oral health especially because of his heart.  He currently denies any dental/orofacial pain or sensitivity. >> Patient is able to manage oral secretions.  Patient denies dysphagia, odynophagia, dysphonia.  Patient denies fever, rigors and malaise.   CHIEF COMPLAINT:  Inpatient preoperative dental consultation.   Patient Active Problem List   Diagnosis Date Noted  . S/P MVR (mitral valve replacement) 03/08/2021  . Mitral valve insufficiency   . Dyspnea and respiratory abnormalities   . 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 Acute on chronic heart failure with preserved ejection fraction (HFpEF)/in the setting of severe mitral valve prolapse 03/03/2021  . Pleural effusion 03/01/2021  . Atrial fibrillation ([place]) 03/01/2021  . Hx of tobacco use, presenting hazards to health 12/15/2019  . BMI 30.0-30.9,adult 06/21/2015  . Type 2 diabetes mellitus with other specified complication ([place]) 82/95/6213  . Essential hypertension, benign 01/13/2013  . Hyperlipidemia with target LDL less than 100 01/13/2013  . Fibromyalgia   . Chronic low back pain   . Vitamin D deficiency    Past Medical History:  Diagnosis Date  .  Chronic low back pain   . Diabetes mellitus without complication ([place])   . Fibromyalgia   . Hyperlipidemia   . Hypertension   . Vitamin D deficiency    Past Surgical History:  Procedure  Laterality Date  . HERNIA REPAIR    . RIGHT/LEFT HEART CATH AND CORONARY ANGIOGRAPHY N/A 03/05/2021   Procedure: RIGHT/LEFT HEART CATH AND CORONARY ANGIOGRAPHY;  Surgeon: Leonie Man, MD;  Location: [facility];  Service: Cardiovascular;  Laterality: N/A;  . TEE WITHOUT CARDIOVERSION N/A 03/05/2021   Procedure: TRANSESOPHAGEAL ECHOCARDIOGRAM (TEE);  Surgeon: Donato Heinz, MD;  Location: [facility] ENDOSCOPY;  Service: Cardiovascular;  Laterality: N/A;   No Known Allergies Current Facility-Administered Medications  Medication Dose Route Frequency Provider Last Rate Last Admin  . 0.45 % sodium chloride infusion   Intravenous Continuous PRN Elgie Collard, PA-C   Paused at 03/09/21 1610  . 0.9 %  sodium chloride infusion  250 mL Intravenous Continuous Conte, Tessa N, PA-C      . 0.9 %  sodium chloride infusion   Intravenous Continuous Elgie Collard, PA-C 10 mL/hr at 03/09/21 0532 New Bag/Given (Non-Interop) at 03/09/21 0532  . acetaminophen (TYLENOL) tablet 1,000 mg  1,000 mg Oral Q6H Conte, Tessa N, PA-C       Or  . acetaminophen (TYLENOL) 160 MG/5ML solution 1,000 mg  1,000 mg Per Tube Q6H Conte, Tessa N, PA-C      . aspirin EC tablet 325 mg  325 mg Oral Daily Conte, Tessa N, PA-C       Or  . aspirin chewable tablet 324 mg  324 mg Per Tube Daily Conte, Tessa N, PA-C      . bisacodyl (DULCOLAX) EC tablet 10 mg  10 mg Oral Daily Conte, Tessa N, PA-C       Or  . bisacodyl (DULCOLAX) suppository 10 mg  10 mg Rectal Daily Conte, Tessa N, PA-C      . cefUROXime (ZINACEF) 1.5 g in sodium chloride 0.9 % 100 mL IVPB  1.5 g Intravenous Q12H Conte, Tessa N, PA-C 200 mL/hr at 03/09/21 0800 Infusion Verify at 03/09/21 0800  . chlorhexidine gluconate (MEDLINE KIT) (PERIDEX) 0.12 % solution 15 mL  15 mL Mouth Rinse BID Wonda Olds, MD   15 mL at 03/09/21 0800  . Chlorhexidine Gluconate Cloth 2 % PADS 6 each  6 each Topical Daily Wonda Olds, MD   6 each at 03/09/21 0425  .  dexmedetomidine (PRECEDEX) 400 MCG/100ML (4 mcg/mL) infusion  0-0.7 mcg/kg/hr Intravenous Continuous Elgie Collard, PA-C 14.75 mL/hr at 03/09/21 0800 0.7 mcg/kg/hr at 03/09/21 0800  . dextrose 50 % solution 0-50 mL  0-50 mL Intravenous PRN Conte, Tessa N, PA-C      . docusate sodium (COLACE) capsule 200 mg  200 mg Oral Daily Conte, Tessa N, PA-C      . electrolyte-A (PLASMALYTE-A PH 7.4) infusion   Intravenous Continuous Wonda Olds, MD 75 mL/hr at 03/09/21 0800 Infusion Verify at 03/09/21 0800  . EPINEPHrine (ADRENALIN) 4 mg in NS 250 mL (0.016 mg/mL) premix infusion  0-10 mcg/min Intravenous Titrated Harriet Pho, Tessa N, PA-C 11.25 mL/hr at 03/09/21 0800 3 mcg/min at 03/09/21 0800  . insulin regular, human (MYXREDLIN) 100 units/ 100 mL infusion   Intravenous Continuous Elgie Collard, PA-C 3 mL/hr at 03/09/21 0800 Infusion Verify at 03/09/21 0800  . lactated ringers infusion 500 mL  500 mL Intravenous Once PRN Elgie Collard, PA-C      .  lactated ringers infusion   Intravenous Continuous Conte, Tessa N, PA-C      . lactated ringers infusion   Intravenous Continuous Elgie Collard, PA-C 20 mL/hr at 03/09/21 0800 Infusion Verify at 03/09/21 0800  . levalbuterol (XOPENEX) nebulizer solution 0.63 mg  0.63 mg Nebulization Q6H Wonda Olds, MD   0.63 mg at 03/09/21 0803  . MEDLINE mouth rinse  15 mL Mouth Rinse 10 times per day Wonda Olds, MD   15 mL at 03/09/21 0547  . metoprolol tartrate (LOPRESSOR) injection 2.5-5 mg  2.5-5 mg Intravenous Q2H PRN Harriet Pho, Tessa N, PA-C      . midazolam (VERSED) injection 2 mg  2 mg Intravenous Q1H PRN Nicholes Rough N, PA-C   2 mg at 03/09/21 2536  . milrinone (PRIMACOR) 20 MG/100 ML (0.2 mg/mL) infusion  0.25 mcg/kg/min Intravenous Continuous Atkins, Broadus Z, MD 6.32 mL/hr at 03/09/21 0800 0.25 mcg/kg/min at 03/09/21 0800  . morphine 2 MG/ML injection 1-4 mg  1-4 mg Intravenous Q1H PRN Elgie Collard, PA-C   4 mg at 03/09/21 6440  . nitroGLYCERIN 50 mg in  dextrose 5 % 250 mL (0.2 mg/mL) infusion  0-100 mcg/min Intravenous Titrated Elgie Collard, PA-C   Stopped at 03/08/21 1641  . ondansetron (ZOFRAN) injection 4 mg  4 mg Intravenous Q6H PRN Harriet Pho, Tessa N, PA-C      . oxyCODONE (Oxy IR/ROXICODONE) immediate release tablet 5-10 mg  5-10 mg Oral Q3H PRN Harriet Pho, Tessa N, PA-C      . [START ON 03/10/2021] pantoprazole (PROTONIX) EC tablet 40 mg  40 mg Oral Daily Conte, Tessa N, PA-C      . phenylephrine (NEOSYNEPHRINE) 20-0.9 MG/250ML-% infusion  0-100 mcg/min Intravenous Titrated Elgie Collard, [place]   Stopped at 03/09/21 [phone]  . rosuvastatin (CRESTOR) tablet 10 mg  10 mg Oral Daily Conte, Tessa N, PA-C      . sodium chloride flush (NS) 0.9 % injection 10-40 mL  10-40 mL Intracatheter Q12H Wonda Olds, MD   20 mL at 03/08/21 2211  . sodium chloride flush (NS) 0.9 % injection 10-40 mL  10-40 mL Intracatheter PRN Atkins, Glenice Bow, MD      . sodium chloride flush (NS) 0.9 % injection 3 mL  3 mL Intravenous Q12H Conte, Tessa N, PA-C      . sodium chloride flush (NS) 0.9 % injection 3 mL  3 mL Intravenous PRN Conte, Tessa N, PA-C      . traMADol Veatrice Bourbon) tablet 50-100 mg  50-100 mg Oral Q4H PRN Elgie Collard, [place]        LABS: Lab Results  Component Value Date   WBC 17.4 (H) 03/09/2021   HGB 9.5 (L) 03/09/2021   HCT 28.0 (L) 03/09/2021   MCV 90.3 03/09/2021   PLT 196 03/09/2021      Component Value Date/Time   NA 140 03/09/2021 0334   NA 139 12/28/2020 1030   K 4.5 03/09/2021 0334   CL 107 03/09/2021 0327   CO2 21 (L) 03/09/2021 0327   GLUCOSE 122 (H) 03/09/2021 0327   BUN 24 (H) 03/09/2021 0327   BUN 18 12/28/2020 1030   CREATININE 1.20 03/09/2021 0327   CREATININE 1.00 01/28/2013 1216   CALCIUM 7.4 (L) 03/09/2021 0327   GFRNONAA >60 03/09/2021 0327   GFRNONAA 83 01/28/2013 1216   GFRAA 103 12/28/2020 1030   GFRAA >89 01/28/2013 1216   Lab Results  Component Value Date   INR 1.6 (  H) 03/08/2021   INR 1.0 03/07/2021   No  results found for: PTT  Social History   Socioeconomic History  . Marital status: Married    Spouse name: [name]  . Number of children: 2  . Years of education: 51  . Highest education level: Some college, no degree  Occupational History  . Occupation: Retired    Fish farm manager: [employer]  . Occupation: disabled  Tobacco Use  . Smoking status: Former Smoker    Packs/day: 0.50    Years: 20.00    Pack years: 10.00    Types: Cigarettes    Quit date: 02/09/2019    Years since quitting: 2.0  . Smokeless tobacco: Never Used  Vaping Use  . Vaping Use: Never used  Substance and Sexual Activity  . Alcohol use: Yes    Comment: occasional  . Drug use: No  . Sexual activity: Not Currently  Other Topics Concern  . Not on file  Social History Narrative   Patient has fibromyalgia and has significant pain with that. He has two adult children and is married. He has one grandchild.    Social Determinants of Health   Financial Resource Strain: Low Risk   . Difficulty of Paying Living Expenses: Not hard at all  Food Insecurity: No Food Insecurity  . Worried About Charity fundraiser in the Last Year: Never true  . Ran Out of Food in the Last Year: Never true  Transportation Needs: No Transportation Needs  . Lack of Transportation (Medical): No  . Lack of Transportation (Non-Medical): No  Physical Activity: Sufficiently Active  . Days of Exercise per Week: 7 days  . Minutes of Exercise per Session: 30 min  Stress: No Stress Concern Present  . Feeling of Stress : Not at all  Social Connections: Moderately Isolated  . Frequency of Communication with Friends and Family: Once a week  . Frequency of Social Gatherings with Friends and Family: More than three times a week  . Attends Religious Services: Never  . Active Member of Clubs or Organizations: No  . Attends Archivist Meetings: Never  . Marital Status: Married  Human resources officer Violence: Not At Risk  . Fear of Current or  Ex-Partner: No  . Emotionally Abused: No  . Physically Abused: No  . Sexually Abused: No   Family History  Problem Relation Age of Onset  . Heart disease Mother   . Diabetes Mother   . Cancer Mother   . Heart disease Father   . Diabetes Son 15       type 1  . Diabetes Paternal Uncle   . Heart attack Brother   . Hypertension Son      REVIEW OF SYSTEMS: Reviewed with the patient as per HPI. PSYCH: (+) Dental phobia  VITAL SIGNS: BP 98/74   Pulse 90   Temp 98.96 F (37.2 C)   Resp 16   Ht 6' (1.829 m)   Wt 92.5 kg   SpO2 96%   BMI 27.66 kg/m    PHYSICAL EXAM: >> General:  Well-developed, comfortable and in no apparent distress. >> Neurological:  Alert and oriented to person, place and  time. >> Extraoral:  Facial symmetry present without any edema or erythema.  No swelling or lymphadenopathy.  TMJ asymptomatic without clicks or crepitations. >> Intraoral:  Soft tissues appear well-perfused and mucous membranes moist.  FOM and vestibules soft and not raised. Oral cavity without mass or lesion. No signs of infection, parulis, sinus  tract, edema or erythema evident upon exam.   DENTAL EXAM: All clinical findings charted.   >> Dentition:  Overall fair remaining dentition.  Missing teeth, caries, retained root tips, existing restorations. >> The patient is maintaining poor oral hygiene.  >> Periodontal: Inflamed, erythematous gingival tissue. Generalized plaque and calculus accumulation. >> Caries: #15, #18, #31 deep decay. #3, #14, #17 occlusal caries. #24 and #25 incisal fracture with incisal caries >> Retained Root Tips: #2 and #19 >> Defective Restorations: #18 previous RCT with coronal decay/non-restorable tooth; #31 existing amalgam with recurrent decay. >> Endodontics: #18 previous root canal therapy >> Removable/Fixed Prosthodontics: Patient denies wearing partial dentures. >> Occlusion: Unable to assess molar occlusion.  Non-functional teeth numbers 17 and 31.  Supra-erupted tooth #31. >> Other findings: Attrition/wear on incisal edges of mandibular anterior teeth.   RADIOGRAPHIC EXAM:  03/05/21 Orthopanogram interpreted. >> Condyles seated bilaterally in fossas.  No evidence of abnormal pathology.  All visualized osseous structures appear WNL. >> Localized mild horizontal bone loss. Missing teeth, existing restorations. Caries: #15 and #31 deep decay approximating the pulp, #18 previous endodontic therapy with severe decay, #3O and #17O caries. #2 and #19 retained root tips.   ASSESSMENT:  1. Mitral regurgitation 2. Acute exacerbation of heart failure 2. Preoperative dental consultation 3. Missing teeth 4. Caries 5. Retained root tips 6. Accretions on teeth 7. Periodontitis 8. Chronic apical periodontitis 9. Defective dental restoration 10. Attrition/wear 11. 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  I explained that if any chronic or acute dental/oral infection(s) are addressed and subsequently not maintained following medical optimization and recovery, their risk of the previously mentioned complications are just as high and could potentially occur postoperatively.  I explained all significant findings of the dental consultation with the patient including multiple teeth with deep cavities and retained root tips and the recommended care including extractions of non-restorable teeth and establishing care at an outside dental office for routine dental care in order to optimize them for heart surgery from a dental standpoint.  The patient verbalized  understanding of all findings, discussion, and recommendations. >> We then discussed various treatment options to include no treatment, multiple extractions with alveoloplasty, pre-prosthetic surgery as indicated, periodontal therapy, dental restorations, root canal therapy, crown and bridge therapy, implant therapy, and replacement of missing teeth as indicated.  The patient verbalized understanding of all options, and currently wishes to proceed with extractions of recommended/indicated teeth and finding a dental office to provide him with routine care including cleanings/periodontal therapy, exams and replacement of missing teeth as needed.  Due to the urgency of his cardiac surgery scheduled for 4/29 and because he does not have any acute odontogenic infection or pain, would recommend to defer dental treatment until after his heart surgery.  Discussed this with the patient and medical team, and plan to have the patient return to our clinic for treatment of more urgent needs (extractions) and then establish care outside for routine dental care as needed. >>>  Plan to discuss all findings and recommendations with medical team and coordinate future care as needed.   <> The patient tolerated today's visit well.  All questions and concerns were addressed  and answered before conclusion of the consultation.  Risco Benson [place], D.M.D.

## 2021-03-09 NOTE — Progress Notes (Signed)
RT NOTE: RT weaned nitric off per protocol per MD order. RT will continue to monitor.

## 2021-03-10 ENCOUNTER — Inpatient Hospital Stay (HOSPITAL_COMMUNITY): Payer: Medicare Other

## 2021-03-10 DIAGNOSIS — R0600 Dyspnea, unspecified: Secondary | ICD-10-CM | POA: Diagnosis not present

## 2021-03-10 DIAGNOSIS — I5033 Acute on chronic diastolic (congestive) heart failure: Secondary | ICD-10-CM | POA: Diagnosis not present

## 2021-03-10 DIAGNOSIS — I4891 Unspecified atrial fibrillation: Secondary | ICD-10-CM | POA: Diagnosis not present

## 2021-03-10 DIAGNOSIS — I10 Essential (primary) hypertension: Secondary | ICD-10-CM | POA: Diagnosis not present

## 2021-03-10 LAB — POCT I-STAT 7, (LYTES, BLD GAS, ICA,H+H)
Acid-base deficit: 1 mmol/L (ref 0.0–2.0)
Acid-base deficit: 2 mmol/L (ref 0.0–2.0)
Acid-base deficit: 4 mmol/L — ABNORMAL HIGH (ref 0.0–2.0)
Bicarbonate: 20.8 mmol/L (ref 20.0–28.0)
Bicarbonate: 22.4 mmol/L (ref 20.0–28.0)
Bicarbonate: 22.5 mmol/L (ref 20.0–28.0)
Calcium, Ion: 1.12 mmol/L — ABNORMAL LOW (ref 1.15–1.40)
Calcium, Ion: 1.17 mmol/L (ref 1.15–1.40)
Calcium, Ion: 1.17 mmol/L (ref 1.15–1.40)
HCT: 21 % — ABNORMAL LOW (ref 39.0–52.0)
HCT: 21 % — ABNORMAL LOW (ref 39.0–52.0)
HCT: 23 % — ABNORMAL LOW (ref 39.0–52.0)
Hemoglobin: 7.1 g/dL — ABNORMAL LOW (ref 13.0–17.0)
Hemoglobin: 7.1 g/dL — ABNORMAL LOW (ref 13.0–17.0)
Hemoglobin: 7.8 g/dL — ABNORMAL LOW (ref 13.0–17.0)
O2 Saturation: 96 %
O2 Saturation: 96 %
O2 Saturation: 99 %
Patient temperature: 38.2
Patient temperature: 38.3
Potassium: 4 mmol/L (ref 3.5–5.1)
Potassium: 4.2 mmol/L (ref 3.5–5.1)
Potassium: 4.2 mmol/L (ref 3.5–5.1)
Sodium: 140 mmol/L (ref 135–145)
Sodium: 141 mmol/L (ref 135–145)
Sodium: 141 mmol/L (ref 135–145)
TCO2: 22 mmol/L (ref 22–32)
TCO2: 23 mmol/L (ref 22–32)
TCO2: 24 mmol/L (ref 22–32)
pCO2 arterial: 34.9 mmHg (ref 32.0–48.0)
pCO2 arterial: 35.1 mmHg (ref 32.0–48.0)
pCO2 arterial: 36.4 mmHg (ref 32.0–48.0)
pH, Arterial: 7.37 (ref 7.350–7.450)
pH, Arterial: 7.415 (ref 7.350–7.450)
pH, Arterial: 7.421 (ref 7.350–7.450)
pO2, Arterial: 171 mmHg — ABNORMAL HIGH (ref 83.0–108.0)
pO2, Arterial: 77 mmHg — ABNORMAL LOW (ref 83.0–108.0)
pO2, Arterial: 88 mmHg (ref 83.0–108.0)

## 2021-03-10 LAB — BASIC METABOLIC PANEL
Anion gap: 9 (ref 5–15)
BUN: 27 mg/dL — ABNORMAL HIGH (ref 8–23)
CO2: 22 mmol/L (ref 22–32)
Calcium: 7.7 mg/dL — ABNORMAL LOW (ref 8.9–10.3)
Chloride: 108 mmol/L (ref 98–111)
Creatinine, Ser: 1.11 mg/dL (ref 0.61–1.24)
GFR, Estimated: >60 mL/min (ref >60)
Glucose, Bld: 150 mg/dL — ABNORMAL HIGH (ref 70–99)
Potassium: 4.2 mmol/L (ref 3.5–5.1)
Sodium: 139 mmol/L (ref 135–145)

## 2021-03-10 LAB — CBC
HCT: 24.2 % — ABNORMAL LOW (ref 39.0–52.0)
HCT: 24.8 % — ABNORMAL LOW (ref 39.0–52.0)
Hemoglobin: 7.9 g/dL — ABNORMAL LOW (ref 13.0–17.0)
Hemoglobin: 8.1 g/dL — ABNORMAL LOW (ref 13.0–17.0)
MCH: 30.4 pg (ref 26.0–34.0)
MCH: 30.3 pg (ref 26.0–34.0)
MCHC: 32.6 g/dL (ref 30.0–36.0)
MCHC: 32.7 g/dL (ref 30.0–36.0)
MCV: 93.1 fL (ref 80.0–100.0)
MCV: 92.9 fL (ref 80.0–100.0)
Platelets: 121 10*3/uL — ABNORMAL LOW (ref 150–400)
Platelets: 122 10*3/uL — ABNORMAL LOW (ref 150–400)
RBC: 2.6 MIL/uL — ABNORMAL LOW (ref 4.22–5.81)
RBC: 2.67 MIL/uL — ABNORMAL LOW (ref 4.22–5.81)
RDW: 13.1 % (ref 11.5–15.5)
RDW: 13.2 % (ref 11.5–15.5)
WBC: 10.3 10*3/uL (ref 4.0–10.5)
WBC: 9.7 10*3/uL (ref 4.0–10.5)
nRBC: 0 % (ref 0.0–0.2)
nRBC: 0.2 % (ref 0.0–0.2)

## 2021-03-10 LAB — GLUCOSE, CAPILLARY
Glucose-Capillary: 113 mg/dL — ABNORMAL HIGH (ref 70–99)
Glucose-Capillary: 126 mg/dL — ABNORMAL HIGH (ref 70–99)
Glucose-Capillary: 133 mg/dL — ABNORMAL HIGH (ref 70–99)
Glucose-Capillary: 134 mg/dL — ABNORMAL HIGH (ref 70–99)
Glucose-Capillary: 140 mg/dL — ABNORMAL HIGH (ref 70–99)
Glucose-Capillary: 140 mg/dL — ABNORMAL HIGH (ref 70–99)
Glucose-Capillary: 141 mg/dL — ABNORMAL HIGH (ref 70–99)
Glucose-Capillary: 148 mg/dL — ABNORMAL HIGH (ref 70–99)
Glucose-Capillary: 172 mg/dL — ABNORMAL HIGH (ref 70–99)
Glucose-Capillary: 206 mg/dL — ABNORMAL HIGH (ref 70–99)
Glucose-Capillary: 257 mg/dL — ABNORMAL HIGH (ref 70–99)

## 2021-03-10 LAB — COOXEMETRY PANEL
Carboxyhemoglobin: 0.8 % (ref 0.5–1.5)
Methemoglobin: 1.6 % — ABNORMAL HIGH (ref 0.0–1.5)
O2 Saturation: 66.9 %
Total hemoglobin: 7.8 g/dL — ABNORMAL LOW (ref 12.0–16.0)

## 2021-03-10 LAB — CREATININE, SERUM
Creatinine, Ser: 0.97 mg/dL (ref 0.61–1.24)
GFR, Estimated: >60 mL/min (ref >60)

## 2021-03-10 MED ORDER — MILRINONE LACTATE IN DEXTROSE 20-5 MG/100ML-% IV SOLN
0.1250 ug/kg/min | INTRAVENOUS | Status: DC
  Administered 2021-03-10 – 2021-03-12 (×2): 0.125 ug/kg/min via INTRAVENOUS
  Filled 2021-03-10 (×2): qty 100

## 2021-03-10 MED ORDER — FUROSEMIDE 10 MG/ML IJ SOLN
40.0000 mg | Freq: Once | INTRAMUSCULAR | Status: AC
  Administered 2021-03-10: 40 mg via INTRAVENOUS
  Filled 2021-03-10: qty 4

## 2021-03-10 MED ORDER — ORAL CARE MOUTH RINSE
15.0000 mL | Freq: Two times a day (BID) | OROMUCOSAL | Status: DC
  Administered 2021-03-10 – 2021-03-14 (×8): 15 mL via OROMUCOSAL

## 2021-03-10 MED ORDER — INSULIN ASPART 100 UNIT/ML IJ SOLN
0.0000 [IU] | INTRAMUSCULAR | Status: DC
  Administered 2021-03-10: 8 [IU] via SUBCUTANEOUS
  Administered 2021-03-10: 4 [IU] via SUBCUTANEOUS
  Administered 2021-03-10: 12 [IU] via SUBCUTANEOUS
  Administered 2021-03-11: 4 [IU] via SUBCUTANEOUS
  Administered 2021-03-11 (×3): 2 [IU] via SUBCUTANEOUS
  Administered 2021-03-11 – 2021-03-12 (×2): 4 [IU] via SUBCUTANEOUS
  Administered 2021-03-12: 2 [IU] via SUBCUTANEOUS

## 2021-03-10 MED ORDER — ENOXAPARIN SODIUM 40 MG/0.4ML IJ SOSY
40.0000 mg | PREFILLED_SYRINGE | Freq: Every day | INTRAMUSCULAR | Status: DC
  Administered 2021-03-10: 40 mg via SUBCUTANEOUS
  Filled 2021-03-10: qty 0.4

## 2021-03-10 MED FILL — Potassium Chloride Inj 2 mEq/ML: INTRAVENOUS | Qty: 40 | Status: AC

## 2021-03-10 MED FILL — Heparin Sodium (Porcine) Inj 1000 Unit/ML: INTRAMUSCULAR | Qty: 30 | Status: AC

## 2021-03-10 MED FILL — Magnesium Sulfate Inj 50%: INTRAMUSCULAR | Qty: 10 | Status: AC

## 2021-03-10 NOTE — Progress Notes (Signed)
Progress Note  Patient Name: Michael Valenzuela Date of Encounter: 03/10/2021  [facility] Cardiologist:   Lives in [place], consider [place] office  Subjective   Extubated yesterday morning.  Progressing.  Chest hurting quite a bit he states.  Elevated temperature over the past 24 hours.  Resolved.  Inpatient Medications    Scheduled Meds: . acetaminophen  1,000 mg Oral Q6H   Or  . acetaminophen (TYLENOL) oral liquid 160 mg/5 mL  1,000 mg Per Tube Q6H  . acetaminophen  975 mg Rectal Q6H  . aspirin EC  325 mg Oral Daily   Or  . aspirin  324 mg Per Tube Daily  . bisacodyl  10 mg Oral Daily   Or  . bisacodyl  10 mg Rectal Daily  . Chlorhexidine Gluconate Cloth  6 each Topical Daily  . docusate sodium  200 mg Oral Daily  . mouth rinse  15 mL Mouth Rinse BID  . pantoprazole  40 mg Oral Daily  . rosuvastatin  10 mg Oral Daily  . sodium chloride flush  10-40 mL Intracatheter Q12H  . sodium chloride flush  3 mL Intravenous Q12H   Continuous Infusions: . sodium chloride 20 mL/hr at 03/10/21 0700  . sodium chloride    . sodium chloride 10 mL/hr at 03/09/21 0532  . cefUROXime (ZINACEF)  IV 1.5 g (03/10/21 0821)  . dexmedetomidine (PRECEDEX) IV infusion Stopped (03/10/21 0232)  . electrolyte-A 75 mL/hr at 03/10/21 0700  . epinephrine Stopped (03/10/21 0340)  . insulin 1.7 mL/hr at 03/10/21 0700  . lactated ringers    . lactated ringers    . lactated ringers 20 mL/hr at 03/10/21 0700  . milrinone 0.25 mcg/kg/min (03/10/21 0700)  . nitroGLYCERIN Stopped (03/08/21 1641)  . phenylephrine (NEO-SYNEPHRINE) Adult infusion Stopped (03/09/21 0646)   PRN Meds: sodium chloride, dextrose, lactated ringers, levalbuterol, metoprolol tartrate, midazolam, morphine injection, ondansetron (ZOFRAN) IV, oxyCODONE, sodium chloride flush, sodium chloride flush, traMADol   Vital Signs    Vitals:   03/10/21 0500 03/10/21 0600 03/10/21 0700 03/10/21 0750  BP: (!) 141/78 125/73 129/75   Pulse:       Resp: 18 19 16 19   Temp: 100.04 F (37.8 C) 100.04 F (37.8 C) 99.5 F (37.5 C) 99.32 F (37.4 C)  TempSrc:      SpO2: 100% 99% 100% 97%  Weight: 95.9 kg     Height:        Intake/Output Summary (Last 24 hours) at 03/10/2021 0834 Last data filed at 03/10/2021 0700 Gross per 24 hour  Intake 3306.56 ml  Output 1850 ml  Net 1456.56 ml   Last 3 Weights 03/10/2021 03/09/2021 03/08/2021  Weight (lbs) 211 lb 6.7 oz 203 lb 14.8 oz 185 lb 14.4 oz  Weight (kg) 95.9 kg 92.5 kg 84.324 kg      Telemetry    AV pacing.- Personally Reviewed  ECG    AV pacing 92 beats per minute- Personally Reviewed  Physical Exam   GEN: No acute distress.  Swan-Ganz noted, chest tubes, external pacing wires Neck: No JVD Cardiac: RRR, no murmurs, rubs, or gallops.  Respiratory: Clear to auscultation bilaterally. GI: Soft, nontender, non-distended  MS:  1+ lower extremity edema; No deformity. Neuro:  Nonfocal  Psych: Normal affect   Labs    High Sensitivity Troponin:   Recent Labs  Lab 03/01/21 1343 03/01/21 1602  TROPONINIHS 11 11      Chemistry Recent Labs  Lab 03/08/21 0436 03/08/21 0758 03/09/21 0327 03/09/21  6010 03/09/21 1629 03/09/21 2318 03/10/21 0033 03/10/21 0244 03/10/21 0418  NA 134*   < > 137   < > 139   < > 141 141 139  K 4.3   < > 4.5   < > 4.3   < > 4.2 4.0 4.2  CL 101   < > 107  --  111  --   --   --  108  CO2 24   < > 21*  --  23  --   --   --  22  GLUCOSE 152*   < > 122*  --  133*  --   --   --  150*  BUN 23   < > 24*  --  25*  --   --   --  27*  CREATININE 0.97   < > 1.20  --  1.06  --   --   --  1.11  CALCIUM 9.1   < > 7.4*  --  7.6*  --   --   --  7.7*  PROT 6.2*  --   --   --   --   --   --   --   --   ALBUMIN 3.3*  --   --   --   --   --   --   --   --   AST 22  --   --   --   --   --   --   --   --   ALT 21  --   --   --   --   --   --   --   --   ALKPHOS 38  --   --   --   --   --   --   --   --   BILITOT 0.9  --   --   --   --   --   --   --   --    GFRNONAA >60   < > >60  --  >60  --   --   --  >60  ANIONGAP 9   < > 9  --  5  --   --   --  9   < > = values in this interval not displayed.     Hematology Recent Labs  Lab 03/09/21 0327 03/09/21 0334 03/09/21 1629 03/09/21 2318 03/10/21 0033 03/10/21 0244 03/10/21 0418  WBC 17.4*  --  11.1*  --   --   --  10.3  RBC 3.00*  --  2.73*  --   --   --  2.60*  HGB 9.2*   < > 8.0*   < > 7.1* 7.8* 7.9*  HCT 27.1*   < > 25.1*   < > 21.0* 23.0* 24.2*  MCV 90.3  --  91.9  --   --   --  93.1  MCH 30.7  --  29.3  --   --   --  30.4  MCHC 33.9  --  31.9  --   --   --  32.6  RDW 12.6  --  13.0  --   --   --  13.1  PLT 196  --  137*  --   --   --  121*   < > = values in this interval not displayed.    BNPNo results for input(s): BNP, PROBNP in the last 168 hours.  DDimer No results for input(s): DDIMER in the last 168 hours.   Radiology    DG Chest Port 1 View  Result Date: 03/09/2021 CLINICAL DATA:  Status post mitral valve repair EXAM: PORTABLE CHEST 1 VIEW COMPARISON:  March 08, 2021 FINDINGS: The ETT is in good position. The PA catheter is stable terminating in the pulmonary outflow tract. Left chest tubes are stable. No pneumothorax. Stable cardiomegaly. The hila and mediastinum are unchanged. Small bilateral layering effusions with underlying atelectasis. Mild pulmonary venous congestion without overt edema. No other acute abnormalities. IMPRESSION: 1. Support apparatus as above. 2. Mild pulmonary venous congestion without overt edema. 3. Small bilateral pleural effusions with underlying atelectasis, new in the interval. Electronically Signed   By: Dorise Bullion III M.D   On: 03/09/2021 09:00   DG Chest Port 1 View  Result Date: 03/08/2021 CLINICAL DATA:  Cardiac surgery EXAM: PORTABLE CHEST 1 VIEW COMPARISON:  03/03/2021 FINDINGS: Interval postsurgical changes within the knee chest including cardiac valve replacement, atrial appendage clip, and median sternotomy. Right IJ approach  Swan-Ganz catheter terminates at the level of the proximal pulmonary outflow. Bilateral chest tubes and mediastinal drain in place. Endotracheal tube terminates 5.5 cm above the carina. Stable cardiomegaly. Atherosclerotic calcification of the aortic knob. Streaky right perihilar opacity. No appreciable pleural fluid collection. No pneumothorax. IMPRESSION: 1. Interval postsurgical changes within the chest with support apparatus, as above. No acute findings. 2. Streaky right perihilar opacity, likely atelectasis. Electronically Signed   By: Davina Poke D.O.   On: 03/08/2021 15:47    Cardiac Studies    Cath- no significant coronary disease Echo- severe mitral regurgitation, EF 75%  Patient Profile     65 y.o. male  with newly discovered severe mitral regurgitation, acute diastolic heart failure in this setting, no significant coronary artery disease on preop catheterization who underwent mitral valve replacement, Dr. Orvan Seen  Assessment & Plan    Severe mitral regurgitation/mitral valve replacement - Repair attempted but ultimately required replacement of mitral valve.  Did not require coronary bypass, nonobstructive CAD. - Continuing with milrinone for support.  Epinephrine, phenylephrine have been stopped.  Postop anemia - Hemoglobin 7.9 this morning.  Monitoring. -Leukocytosis improving, transient postop fever   For questions or updates, please contact [place] Please consult [email or web address] for contact info under        Signed, Candee Furbish, MD  03/10/2021, 8:34 AM

## 2021-03-10 NOTE — Plan of Care (Signed)
  Problem: Education: Goal: Knowledge of General Education information will improve Description: Including pain rating scale, medication(s)/side effects and non-pharmacologic comfort measures Outcome: Progressing   Problem: Clinical Measurements: Goal: Will remain free from infection Outcome: Progressing   Problem: Clinical Measurements: Goal: Respiratory complications will improve Outcome: Progressing   Problem: Clinical Measurements: Goal: Cardiovascular complication will be avoided Outcome: Progressing   Problem: Activity: Goal: Risk for activity intolerance will decrease Outcome: Progressing   Problem: Nutrition: Goal: Adequate nutrition will be maintained Outcome: Progressing   Problem: Elimination: Goal: Will not experience complications related to bowel motility Outcome: Progressing Goal: Will not experience complications related to urinary retention Outcome: Progressing   Problem: Pain Managment: Goal: General experience of comfort will improve Outcome: Progressing   Problem: Urinary Elimination: Goal: Ability to achieve and maintain adequate renal perfusion and functioning will improve Outcome: Progressing   Problem: Skin Integrity: Goal: Wound healing without signs and symptoms of infection Outcome: Progressing Goal: Risk for impaired skin integrity will decrease Outcome: Progressing

## 2021-03-10 NOTE — Progress Notes (Signed)
Progress Note  Patient Name: Michael Valenzuela Date of Encounter: 03/11/2021  [facility] Cardiologist: No primary care provider on file.   Subjective   Feels weak but SOB improving. OOB to chair this AM  INR 1.4-->plan to start warfarin per CV surgery Cr stable at 0.75; 730cc CT output and 1850 UOP On lasix 40mg  IV BID  Inpatient Medications    Scheduled Meds: . acetaminophen  1,000 mg Oral Q6H   Or  . acetaminophen (TYLENOL) oral liquid 160 mg/5 mL  1,000 mg Per Tube Q6H  . acetaminophen  975 mg Rectal Q6H  . amiodarone  400 mg Oral BID  . aspirin EC  81 mg Oral Daily  . bisacodyl  10 mg Oral Daily   Or  . bisacodyl  10 mg Rectal Daily  . Chlorhexidine Gluconate Cloth  6 each Topical Daily  . colchicine  0.3 mg Oral BID  . docusate sodium  200 mg Oral Daily  . enoxaparin (LOVENOX) injection  40 mg Subcutaneous QHS  . furosemide  40 mg Intravenous BID  . insulin aspart  0-24 Units Subcutaneous Q4H  . ketorolac  7.5 mg Intravenous Q6H  . mouth rinse  15 mL Mouth Rinse BID  . pantoprazole  40 mg Oral Daily  . rosuvastatin  10 mg Oral Daily  . sodium chloride flush  3 mL Intravenous Q12H   Continuous Infusions: . insulin Stopped (03/10/21 1048)  . lactated ringers    . milrinone 0.125 mcg/kg/min (03/11/21 0600)   PRN Meds: dextrose, lactated ringers, levalbuterol, metoprolol tartrate, morphine injection, ondansetron (ZOFRAN) IV, oxyCODONE, sodium chloride flush, traMADol   Vital Signs    Vitals:   03/11/21 0400 03/11/21 0500 03/11/21 0600 03/11/21 0700  BP: 137/90 (!) 148/85 139/86   Pulse:      Resp: 14 15 12    Temp:    97.7 F (36.5 C)  TempSrc:    Oral  SpO2: 96% 99% 98%   Weight:  95.6 kg    Height:        Intake/Output Summary (Last 24 hours) at 03/11/2021 0814 Last data filed at 03/11/2021 0600 Gross per 24 hour  Intake 2846.73 ml  Output 2580 ml  Net 266.73 ml   Last 3 Weights 03/11/2021 03/10/2021 03/09/2021  Weight (lbs) 210 lb 12.2 oz 211 lb 6.7 oz  203 lb 14.8 oz  Weight (kg) 95.6 kg 95.9 kg 92.5 kg      Telemetry    Sinus brady with intermittent pacing, episodes of aflutter, occasional PVCs - Personally Reviewed  ECG    No new tracing - Personally Reviewed  Physical Exam   GEN: No acute distress. Sitting up in a chair  Neck: No JVD Cardiac: Bradycardic, regular, no murmurs, rubs, or gallops. Sternotomy site c/d/i.  Respiratory: Crackles at left lung base; otherwise clear GI: Soft, nontender, non-distended  MS: No edema; No deformity. Warm Neuro:  Nonfocal  Psych: Normal affect   Labs    High Sensitivity Troponin:   Recent Labs  Lab 03/01/21 1343 03/01/21 1602  TROPONINIHS 11 11      Chemistry Recent Labs  Lab 03/08/21 0436 03/08/21 0758 03/09/21 1629 03/09/21 2318 03/10/21 0244 03/10/21 0418 03/10/21 1049 03/11/21 0403  NA 134*   < > 139   < > 141 139  --  136  K 4.3   < > 4.3   < > 4.0 4.2  --  4.3  CL 101   < > 111  --   --  108  --  102  CO2 24   < > 23  --   --  22  --  28  GLUCOSE 152*   < > 133*  --   --  150*  --  152*  BUN 23   < > 25*  --   --  27*  --  26*  CREATININE 0.97   < > 1.06  --   --  1.11 0.97 0.75  CALCIUM 9.1   < > 7.6*  --   --  7.7*  --  8.0*  PROT 6.2*  --   --   --   --   --   --   --   ALBUMIN 3.3*  --   --   --   --   --   --   --   AST 22  --   --   --   --   --   --   --   ALT 21  --   --   --   --   --   --   --   ALKPHOS 38  --   --   --   --   --   --   --   BILITOT 0.9  --   --   --   --   --   --   --   GFRNONAA >60   < > >60  --   --  >60 >60 >60  ANIONGAP 9   < > 5  --   --  9  --  6   < > = values in this interval not displayed.     Hematology Recent Labs  Lab 03/10/21 0418 03/10/21 1049 03/11/21 0403  WBC 10.3 9.7 10.1  RBC 2.60* 2.67* 2.68*  HGB 7.9* 8.1* 7.9*  HCT 24.2* 24.8* 25.1*  MCV 93.1 92.9 93.7  MCH 30.4 30.3 29.5  MCHC 32.6 32.7 31.5  RDW 13.1 13.2 13.0  PLT 121* 122* 128*    BNPNo results for input(s): BNP, PROBNP in the last 168  hours.   DDimer No results for input(s): DDIMER in the last 168 hours.   Radiology    DG Chest Port 1 View  Result Date: 03/11/2021 CLINICAL DATA:  Pleural effusion EXAM: PORTABLE CHEST 1 VIEW COMPARISON:  Radiograph 03/10/2021, CT 03/01/2021 FINDINGS: A right IJ approach central venous catheter sheath terminates at the mid SVC Bilateral pleural and mediastinal drains are in place. Postoperative changes from sternotomy, bioprosthetic aortic valve replacement and occlusion of the left atrial appendage. Telemetry leads overlie the chest. Stable postoperative mediastinal widening. Some residual mid to lower lung opacities are similar to prior with pulmonary vascular congestion. Layering bilateral effusions, left greater than right, not significantly changed from prior. No pneumothorax. No acute osseous or soft tissue abnormality. IMPRESSION: Removal of the Swan-Ganz catheter with a right IJ sheath remaining in place. No other significant interval change from comparison exam with persistent mid to lower lung predominant opacities, layering effusions, left greater than right, and pulmonary vascular congestion. Stable mediastinal contours with cardiomegaly. Electronically Signed   By: Lovena Le M.D.   On: 03/11/2021 06:17   DG CHEST PORT 1 VIEW  Result Date: 03/10/2021 CLINICAL DATA:  Mitral valve replacement. EXAM: PORTABLE CHEST 1 VIEW COMPARISON:  03/09/2021 FINDINGS: 0624 hours. The cardio pericardial silhouette is enlarged. There is pulmonary vascular congestion without overt pulmonary edema. Bibasilar atelectasis is similar to prior with probable  left effusion. Endotracheal tube has been removed in the interval. Bilateral chest tubes remain in place with mediastinal/pericardial drain noted over the midline. Right IJ pulmonary artery catheter tip is in the pulmonary outflow tract, potentially just into the left main pulmonary artery IMPRESSION: Interval extubation. Otherwise no substantial change.  Electronically Signed   By: Misty Stanley M.D.   On: 03/10/2021 09:04    Cardiac Studies   TEE 03/05/21: IMPRESSIONS  1. Left ventricular ejection fraction, by estimation, is 60 to 65%. The  left ventricle has normal function.  2. Right ventricular systolic function is normal. The right ventricular  size is mildly enlarged.  3. Left atrial size was severely dilated. No left atrial/left atrial  appendage thrombus was detected.  4. Right atrial size was mildly dilated.  5. The aortic valve is tricuspid. Aortic valve regurgitation is not  visualized.  6. The mitral valve is abnormal. Flail P2. Severe mitral valve  regurgitation. MR severe by both 2D ERO and 3D VCA. Systolic flow reversal  in pulmonary veins, consistent with severe MR.  Cath 03/05/21:  The left ventricular systolic function is normal. The left ventricular ejection fraction is 55-65% by visual estimate.  LV end diastolic pressure is mildly elevated.  There is moderate (3+) mitral regurgitation.  Hemodynamic findings consistent with mild pulmonary hypertension.  Angiographically normal coronary arteries   SUMMARY  Angiographically Normal Coronary Arteries  Mild Pulmonary Hypertension with mean PAP 30 mmHg, PCWP 5 mmHg with LVEDP 15 mmHg.  Mild-moderately reduced Cardiac Output-Cardiac Index (Fick) 4.44-2.12.  RECOMMENDATIONS  Continue with evaluation for mitral valve surgery    Patient Profile     65 y.o. male with a hx of DM, HTN, HLD, tobacco use, chronic back pain, fibromyalgia, atrial flutter and newly diagnosed severe MR and acute diastolic heart failure who is now s/p MVR with Dr. Orvan Seen.  Assessment & Plan    #Severe MR s/p MVR: Patient with newly diagnosed severe MR and diastolic HF in that setting. Attempted mitral valve repair, but ultimately required mitral valve replacement. Pre-op cath with non-obstructive CAD. -Continue milirinone for inotropic support -Continue ASA 324mg  -CT in  place; management per CV surgery -Lasix 40mg  IV BID -Plan to start warfarin today  #Afib/Flutter: CHADs-vasc 4 s/p MAZE procedure as above. Previously on eliquis. Intermittently in slow flutter overnight.  -Starting warfarin per CV surgery -Started on amiodarone oral load  #HTN: -Holding in the immediate post-op period  #HLD: -Continue crestor 10mg  daily    For questions or updates, please contact [place] Please consult [email or web address] for contact info under        Signed, Freada Bergeron, MD  03/11/2021, 8:14 AM

## 2021-03-10 NOTE — Progress Notes (Signed)
[address]       [place] [address]             [phone]                 2 Days Post-Op Procedure(s) (LRB): MITRAL VALVE REPAIR USING  PHYSIO RING SIZE 28 (EXPLANTED) THEN MITRAL VALVE REPLACEMENT (MVR) WITH MAGNA EASE MITRAL VALVE SIZE 25MM  ON PUMP, LEFT ATRIAL APPENDAGE CLIPPING USING A SIZE 50 ATRIAL CLIP (N/A) MAZE (N/A) TRANSESOPHAGEAL ECHOCARDIOGRAM (TEE) (N/A)   Events: Extubated overnight _______________________________________________________________ Vitals: BP 120/79 (BP Location: Left Arm)   Pulse 80   Temp 99.3 F (37.4 C) (Core)   Resp 19   Ht 6' (1.829 m)   Wt 95.9 kg   SpO2 96%   BMI 28.67 kg/m   - Neuro: arousable, NAD  - Cardiovascular: sinus in 60s  Drips: milr 0.25   PAP: (27-59)/(11-25) 57/20 CO:  [4.5 L/min-5 L/min] 5 L/min CI:  [2.2 L/min/m2-2.4 L/min/m2] 2.4 L/min/m2  - Pulm:  Vent Mode: CPAP;PSV FiO2 (%):  [40 %-60 %] 40 % Set Rate:  [4 bmp-16 bmp] 4 bmp Vt Set:  [867 mL] 620 mL PEEP:  [5 cmH20-8 cmH20] 5 cmH20 Pressure Support:  [10 cmH20] 10 cmH20 Plateau Pressure:  [17 cmH20-20 cmH20] 17 cmH20  ABG    Component Value Date/Time   PHART 7.370 03/10/2021 0244   PCO2ART 36.4 03/10/2021 0244   PO2ART 171 (H) 03/10/2021 0244   HCO3 20.8 03/10/2021 0244   TCO2 22 03/10/2021 0244   ACIDBASEDEF 4.0 (H) 03/10/2021 0244   O2SAT 66.9 03/10/2021 0410    - Abd: soft - Extremity: warm  .Intake/Output      04/30 0701 05/01 0700 05/01 0701 05/02 0700   P.O.     I.V. (mL/kg) 3483.6 (36.3)    Blood     Other     IV Piggyback 200.1    Total Intake(mL/kg) 3683.6 (38.4)    Urine (mL/kg/hr) 1200 (0.5)    Blood     Chest Tube 700 80   Total Output 1900 80   Net +1783.6 -80           _______________________________________________________________ Labs: CBC Latest Ref Rng & Units 03/10/2021 03/10/2021 03/10/2021  WBC 4.0 - 10.5 K/uL 10.3 - -  Hemoglobin 13.0 - 17.0 g/dL 7.9(L) 7.8(L) 7.1(L)  Hematocrit 39.0 -  52.0 % 24.2(L) 23.0(L) 21.0(L)  Platelets 150 - 400 K/uL 121(L) - -   CMP Latest Ref Rng & Units 03/10/2021 03/10/2021 03/10/2021  Glucose 70 - 99 mg/dL 150(H) - -  BUN 8 - 23 mg/dL 27(H) - -  Creatinine 0.61 - 1.24 mg/dL 1.11 - -  Sodium 135 - 145 mmol/L 139 141 141  Potassium 3.5 - 5.1 mmol/L 4.2 4.0 4.2  Chloride 98 - 111 mmol/L 108 - -  CO2 22 - 32 mmol/L 22 - -  Calcium 8.9 - 10.3 mg/dL 7.7(L) - -  Total Protein 6.5 - 8.1 g/dL - - -  Total Bilirubin 0.3 - 1.2 mg/dL - - -  Alkaline Phos 38 - 126 U/L - - -  AST 15 - 41 U/L - - -  ALT 0 - 44 U/L - - -    CXR: PV congestion  _______________________________________________________________  Assessment and Plan: POD 2 s/p MVRpl, atriclip  Neuro: pain controlled CV: milr to 0.125 today  Pacemaker set to back up rate Pulm: pulm toilet.  Will keep CT for now  Renal: diuresis today GI: advancing diet Heme: stable ID: [email or web address] Endo: SSI Dispo: continue ICU care   Lajuana Matte 03/10/2021 10:31 AM

## 2021-03-11 ENCOUNTER — Inpatient Hospital Stay (HOSPITAL_COMMUNITY): Payer: Medicare Other

## 2021-03-11 ENCOUNTER — Encounter (HOSPITAL_COMMUNITY): Payer: Self-pay | Admitting: Cardiothoracic Surgery

## 2021-03-11 DIAGNOSIS — I5033 Acute on chronic diastolic (congestive) heart failure: Secondary | ICD-10-CM | POA: Diagnosis not present

## 2021-03-11 LAB — GLUCOSE, CAPILLARY
Glucose-Capillary: 119 mg/dL — ABNORMAL HIGH (ref 70–99)
Glucose-Capillary: 125 mg/dL — ABNORMAL HIGH (ref 70–99)
Glucose-Capillary: 148 mg/dL — ABNORMAL HIGH (ref 70–99)
Glucose-Capillary: 152 mg/dL — ABNORMAL HIGH (ref 70–99)
Glucose-Capillary: 153 mg/dL — ABNORMAL HIGH (ref 70–99)
Glucose-Capillary: 168 mg/dL — ABNORMAL HIGH (ref 70–99)
Glucose-Capillary: 180 mg/dL — ABNORMAL HIGH (ref 70–99)
Glucose-Capillary: 183 mg/dL — ABNORMAL HIGH (ref 70–99)

## 2021-03-11 LAB — BASIC METABOLIC PANEL
Anion gap: 6 (ref 5–15)
BUN: 26 mg/dL — ABNORMAL HIGH (ref 8–23)
CO2: 28 mmol/L (ref 22–32)
Calcium: 8 mg/dL — ABNORMAL LOW (ref 8.9–10.3)
Chloride: 102 mmol/L (ref 98–111)
Creatinine, Ser: 0.75 mg/dL (ref 0.61–1.24)
GFR, Estimated: >60 mL/min (ref >60)
Glucose, Bld: 152 mg/dL — ABNORMAL HIGH (ref 70–99)
Potassium: 4.3 mmol/L (ref 3.5–5.1)
Sodium: 136 mmol/L (ref 135–145)

## 2021-03-11 LAB — CBC
HCT: 25.1 % — ABNORMAL LOW (ref 39.0–52.0)
Hemoglobin: 7.9 g/dL — ABNORMAL LOW (ref 13.0–17.0)
MCH: 29.5 pg (ref 26.0–34.0)
MCHC: 31.5 g/dL (ref 30.0–36.0)
MCV: 93.7 fL (ref 80.0–100.0)
Platelets: 128 10*3/uL — ABNORMAL LOW (ref 150–400)
RBC: 2.68 MIL/uL — ABNORMAL LOW (ref 4.22–5.81)
RDW: 13 % (ref 11.5–15.5)
WBC: 10.1 10*3/uL (ref 4.0–10.5)
nRBC: 0.2 % (ref 0.0–0.2)

## 2021-03-11 LAB — COOXEMETRY PANEL
Carboxyhemoglobin: 1.1 % (ref 0.5–1.5)
Methemoglobin: 1 % (ref 0.0–1.5)
O2 Saturation: 69.4 %
Total hemoglobin: 8.1 g/dL — ABNORMAL LOW (ref 12.0–16.0)

## 2021-03-11 LAB — ECHO INTRAOPERATIVE TEE
Height: 72 in
Weight: 2974.4 oz

## 2021-03-11 LAB — PROTIME-INR
INR: 1.4 — ABNORMAL HIGH (ref 0.8–1.2)
Prothrombin Time: 16.9 seconds — ABNORMAL HIGH (ref 11.4–15.2)

## 2021-03-11 LAB — SURGICAL PATHOLOGY

## 2021-03-11 MED ORDER — KETOROLAC TROMETHAMINE 15 MG/ML IJ SOLN
7.5000 mg | Freq: Four times a day (QID) | INTRAMUSCULAR | Status: DC
  Administered 2021-03-11: 7.5 mg via INTRAVENOUS
  Filled 2021-03-11: qty 1

## 2021-03-11 MED ORDER — ASPIRIN EC 81 MG PO TBEC
81.0000 mg | DELAYED_RELEASE_TABLET | Freq: Every day | ORAL | Status: DC
  Administered 2021-03-11 – 2021-03-15 (×5): 81 mg via ORAL
  Filled 2021-03-11 (×5): qty 1

## 2021-03-11 MED ORDER — FUROSEMIDE 10 MG/ML IJ SOLN
40.0000 mg | Freq: Two times a day (BID) | INTRAMUSCULAR | Status: DC
  Administered 2021-03-11 – 2021-03-12 (×4): 40 mg via INTRAVENOUS
  Filled 2021-03-11 (×4): qty 4

## 2021-03-11 MED ORDER — KETOROLAC TROMETHAMINE 15 MG/ML IJ SOLN
7.5000 mg | Freq: Four times a day (QID) | INTRAMUSCULAR | Status: AC
  Administered 2021-03-11 – 2021-03-12 (×4): 7.5 mg via INTRAVENOUS
  Filled 2021-03-11 (×4): qty 1

## 2021-03-11 MED ORDER — WARFARIN - PHARMACIST DOSING INPATIENT: Freq: Every day | Status: DC

## 2021-03-11 MED ORDER — FUROSEMIDE 10 MG/ML IJ SOLN
40.0000 mg | Freq: Once | INTRAMUSCULAR | Status: DC
  Filled 2021-03-11: qty 4

## 2021-03-11 MED ORDER — AMIODARONE HCL 200 MG PO TABS
400.0000 mg | ORAL_TABLET | Freq: Two times a day (BID) | ORAL | Status: DC
  Administered 2021-03-11 (×2): 400 mg via ORAL
  Filled 2021-03-11 (×2): qty 2

## 2021-03-11 MED ORDER — COLCHICINE 0.3 MG HALF TABLET
0.3000 mg | ORAL_TABLET | Freq: Two times a day (BID) | ORAL | Status: DC
  Administered 2021-03-11 – 2021-03-15 (×9): 0.3 mg via ORAL
  Filled 2021-03-11 (×10): qty 1

## 2021-03-11 MED ORDER — WARFARIN SODIUM 2.5 MG PO TABS
2.5000 mg | ORAL_TABLET | Freq: Once | ORAL | Status: AC
  Administered 2021-03-11: 2.5 mg via ORAL
  Filled 2021-03-11: qty 1

## 2021-03-11 NOTE — Progress Notes (Signed)
ANTICOAGULATION CONSULT NOTE - Follow Up Consult  Pharmacy Consult for warfarin  Indication: MVR  No Known Allergies  Patient Measurements: Height: 6' (182.9 cm) Weight: 95.6 kg (210 lb 12.2 oz) IBW/kg (Calculated) : 77.6   Vital Signs: Temp: 97.7 F (36.5 C) (05/02 0700) Temp Source: Oral (05/02 0700) BP: 139/86 (05/02 0600)  Labs: Recent Labs    03/08/21 1509 03/08/21 1510 03/10/21 0418 03/10/21 1049 03/11/21 0403  HGB 11.4*   < > 7.9* 8.1* 7.9*  HCT 34.0*   < > 24.2* 24.8* 25.1*  PLT 210   < > 121* 122* 128*  APTT 40*  --   --   --   --   LABPROT 18.8*  --   --   --  16.9*  INR 1.6*  --   --   --  1.4*  CREATININE  --    < > 1.11 0.97 0.75   < > = values in this interval not displayed.    Estimated Creatinine Clearance: 110.4 mL/min (by C-G formula based on SCr of 0.75 mg/dL).   Assessment: 65 yo M with PMH significant for severe mitral regurg + CHF now s/p mitral valve replacement on 4/29. Pharmacy consulted to dose warfarin for anticoagulation. No PTA anticoagulation. Started on lovenox 40mg  QD for prophylaxis - received one dose on 5/1 @2229 .   CBC-- Hgb 7.9 (low stable), Plt 128 (uptrended) INR 1.4 (subtherapeutic)    Goal of Therapy:  INR 2-3 Monitor platelets by anticoagulation protocol: Yes   Plan:  Initiate warfarin 2.5 mg PO at 1600 today x1 Discontinue enoxaparin for DVT prophylaxis Daily INR Monitor CBC, INR, s/sx bleeding   Wilson Singer, PharmD PGY1 Pharmacy Resident 03/11/2021 8:14 AM

## 2021-03-11 NOTE — Progress Notes (Signed)
1454 Pt fast asleep. Has walked twice today. Will let him sleep and staff can walk later. Will follow up tomorrow. Graylon Good RN BSN 03/11/2021 2:55 PM

## 2021-03-11 NOTE — Anesthesia Postprocedure Evaluation (Signed)
Anesthesia Post Note  Patient: Michael Valenzuela  Procedure(s) Performed: MITRAL VALVE REPAIR USING  PHYSIO RING SIZE 28 (EXPLANTED) THEN MITRAL VALVE REPLACEMENT (MVR) WITH MAGNA EASE MITRAL VALVE SIZE 25MM  ON PUMP, LEFT ATRIAL APPENDAGE CLIPPING USING A SIZE 50 ATRIAL CLIP (N/A ) MAZE (N/A ) TRANSESOPHAGEAL ECHOCARDIOGRAM (TEE) (N/A )     Patient location during evaluation: ICU Anesthesia Type: General Level of consciousness: sedated and patient remains intubated per anesthesia plan Pain management: pain level controlled Vital Signs Assessment: post-procedure vital signs reviewed and stable Respiratory status: patient remains intubated per anesthesia plan Cardiovascular status: stable Postop Assessment: no apparent nausea or vomiting Anesthetic complications: no   No complications documented.  Last Vitals:  Vitals:   03/11/21 0600 03/11/21 0700  BP: 139/86   Pulse:    Resp: 12   Temp:  36.5 C  SpO2: 98%     Last Pain:  Vitals:   03/11/21 0805  TempSrc:   PainSc: [place]

## 2021-03-11 NOTE — Progress Notes (Signed)
Progress Note  Patient Name: Michael Valenzuela Date of Encounter: 03/12/2021  [place] Cardiologist: None   Subjective   Feels tired and weak today.   HR in 50-60s overnight Remains on milrinone 0.125 Cr stable at 0.76; net negative 662mL Wt 210-->209lbs  Inpatient Medications    Scheduled Meds: . acetaminophen  1,000 mg Oral Q6H   Or  . acetaminophen (TYLENOL) oral liquid 160 mg/5 mL  1,000 mg Per Tube Q6H  . amiodarone  200 mg Oral TID  . aspirin EC  81 mg Oral Daily  . bisacodyl  10 mg Oral Daily   Or  . bisacodyl  10 mg Rectal Daily  . Chlorhexidine Gluconate Cloth  6 each Topical Daily  . colchicine  0.3 mg Oral BID  . docusate sodium  200 mg Oral Daily  . ferrous [id number] C-folic acid  1 capsule Oral TID PC  . furosemide  40 mg Intravenous BID  . guaiFENesin  1,200 mg Oral BID  . insulin aspart  0-24 Units Subcutaneous Q4H  . ketorolac  7.5 mg Intravenous Q6H  . mouth rinse  15 mL Mouth Rinse BID  . pantoprazole  40 mg Oral Daily  . potassium chloride  20 mEq Oral BID  . rosuvastatin  10 mg Oral Daily  . Warfarin - Pharmacist Dosing Inpatient   Does not apply q1600   Continuous Infusions: . lactated ringers    . milrinone 0.125 mcg/kg/min (03/12/21 0400)   PRN Meds: dextrose, lactated ringers, levalbuterol, menthol-cetylpyridinium, metoprolol tartrate, morphine injection, ondansetron (ZOFRAN) IV, oxyCODONE, sodium chloride flush, traMADol   Vital Signs    Vitals:   03/11/21 1900 03/11/21 2320 03/12/21 0300 03/12/21 0500  BP: 122/75 123/61 109/61   Pulse: 60 (!) 56 (!) 52   Resp: 13 13 13    Temp: 98.2 F (36.8 C) 98.6 F (37 C) 98.7 F (37.1 C)   TempSrc: Oral Oral Oral   SpO2: 99% 100% 94%   Weight:    95.2 kg  Height:        Intake/Output Summary (Last 24 hours) at 03/12/2021 0817 Last data filed at 03/12/2021 0400 Gross per 24 hour  Intake 142.75 ml  Output 985 ml  Net -842.25 ml   Last 3 Weights 03/12/2021 03/11/2021 03/10/2021   Weight (lbs) 209 lb 14.1 oz 210 lb 12.2 oz 211 lb 6.7 oz  Weight (kg) 95.2 kg 95.6 kg 95.9 kg      Telemetry    Sinus bradycardia/NSR; occasional PVCs - Personally Reviewed  ECG    No new tracing - Personally Reviewed  Physical Exam   GEN: No acute distress. Sitting up in bed Neck: No JVD Cardiac: Bradycardic, regular, no murmurs. Sternotomy site c/d/i Respiratory: Diminished at the bases. Otherwise clear GI: Soft, nontender, non-distended  MS: Warm, trace edema Neuro:  Nonfocal  Psych: Normal affect   Labs    High Sensitivity Troponin:   Recent Labs  Lab 03/01/21 1343 03/01/21 1602  TROPONINIHS 11 11      Chemistry Recent Labs  Lab 03/08/21 0436 03/08/21 0758 03/10/21 0418 03/10/21 1049 03/11/21 0403 03/12/21 0259  NA 134*   < > 139  --  136 134*  K 4.3   < > 4.2  --  4.3 3.6  CL 101   < > 108  --  102 100  CO2 24   < > 22  --  28 30  GLUCOSE 152*   < > 150*  --  152* 148*  BUN 23   < > 27*  --  26* 25*  CREATININE 0.97   < > 1.11 0.97 0.75 0.76  CALCIUM 9.1   < > 7.7*  --  8.0* 8.0*  PROT 6.2*  --   --   --   --   --   ALBUMIN 3.3*  --   --   --   --   --   AST 22  --   --   --   --   --   ALT 21  --   --   --   --   --   ALKPHOS 38  --   --   --   --   --   BILITOT 0.9  --   --   --   --   --   GFRNONAA >60   < > >60 >60 >60 >60  ANIONGAP 9   < > 9  --  6 4*   < > = values in this interval not displayed.     Hematology Recent Labs  Lab 03/10/21 1049 03/11/21 0403 03/12/21 0259  WBC 9.7 10.1 9.0  RBC 2.67* 2.68* 2.50*  HGB 8.1* 7.9* 7.4*  HCT 24.8* 25.1* 23.0*  MCV 92.9 93.7 92.0  MCH 30.3 29.5 29.6  MCHC 32.7 31.5 32.2  RDW 13.2 13.0 13.0  PLT 122* 128* 130*    BNPNo results for input(s): BNP, PROBNP in the last 168 hours.   DDimer No results for input(s): DDIMER in the last 168 hours.   Radiology    DG Chest Port 1 View  Result Date: 03/11/2021 CLINICAL DATA:  Pleural effusion EXAM: PORTABLE CHEST 1 VIEW COMPARISON:   Radiograph 03/10/2021, CT 03/01/2021 FINDINGS: A right IJ approach central venous catheter sheath terminates at the mid SVC Bilateral pleural and mediastinal drains are in place. Postoperative changes from sternotomy, bioprosthetic aortic valve replacement and occlusion of the left atrial appendage. Telemetry leads overlie the chest. Stable postoperative mediastinal widening. Some residual mid to lower lung opacities are similar to prior with pulmonary vascular congestion. Layering bilateral effusions, left greater than right, not significantly changed from prior. No pneumothorax. No acute osseous or soft tissue abnormality. IMPRESSION: Removal of the Swan-Ganz catheter with a right IJ sheath remaining in place. No other significant interval change from comparison exam with persistent mid to lower lung predominant opacities, layering effusions, left greater than right, and pulmonary vascular congestion. Stable mediastinal contours with cardiomegaly. Electronically Signed   By: Lovena Le M.D.   On: 03/11/2021 06:17    Cardiac Studies   TEE 03/05/21: IMPRESSIONS  1. Left ventricular ejection fraction, by estimation, is 60 to 65%. The  left ventricle has normal function.  2. Right ventricular systolic function is normal. The right ventricular  size is mildly enlarged.  3. Left atrial size was severely dilated. No left atrial/left atrial  appendage thrombus was detected.  4. Right atrial size was mildly dilated.  5. The aortic valve is tricuspid. Aortic valve regurgitation is not  visualized.  6. The mitral valve is abnormal. Flail P2. Severe mitral valve  regurgitation. MR severe by both 2D ERO and 3D VCA. Systolic flow reversal  in pulmonary veins, consistent with severe MR.  Cath 03/05/21:  The left ventricular systolic function is normal. The left ventricular ejection fraction is 55-65% by visual estimate.  LV end diastolic pressure is mildly elevated.  There is moderate (3+) mitral  regurgitation.  Hemodynamic findings consistent with mild  pulmonary hypertension.  Angiographically normal coronary arteries  SUMMARY  Angiographically Normal Coronary Arteries  Mild Pulmonary Hypertension with mean PAP 30 mmHg, PCWP 5 mmHg with LVEDP 15 mmHg.  Mild-moderately reduced Cardiac Output-Cardiac Index (Fick) 4.44-2.12.  RECOMMENDATIONS  Continue with evaluation for mitral valve surgery   Patient Profile     65 y.o. male with a hx of DM, HTN, HLD, tobacco use, chronic back pain, fibromyalgia, atrial flutter and newly diagnosed severe MR and acute diastolic heart failure who is now s/p MVR with Dr. Orvan Seen.  Assessment & Plan    #Severe MR s/p MVR: Patient with newly diagnosed severe MR and diastolic HF in that setting. Attempted mitral valve repair, but ultimately required mitral valve replacement. Pre-op cath with non-obstructive CAD. -Continue milirinone for inotropic support per CV surgery -Continue ASA 81mg  daily -CT in place; management per CV surgery -Lasix 40mg  IV BID -Warfarin started 5/2  #Afib/Flutter: CHADs-vasc 4 s/p MAZE procedure as above. Previously on eliquis.  -Warfarin per CV surgery -On amiodarone 200mg  TID  #HTN: -Holding in the immediate post-op period  #HLD: -Continue crestor 10mg  daily   For questions or updates, please contact [facility] Please consult [email or web address] for contact info under        Signed, Freada Bergeron, MD  03/12/2021, 8:17 AM

## 2021-03-11 NOTE — Progress Notes (Addendum)
[facility] DAILY ICU PROGRESS NOTE                   [address]            [place] [address]          [phone]   3 Days Post-Op Procedure(s) (LRB): MITRAL VALVE REPAIR USING  PHYSIO RING SIZE 28 (EXPLANTED) THEN MITRAL VALVE REPLACEMENT (MVR) WITH MAGNA EASE MITRAL VALVE SIZE 25MM  ON PUMP, LEFT ATRIAL APPENDAGE CLIPPING USING A SIZE 50 ATRIAL CLIP (N/A) MAZE (N/A) TRANSESOPHAGEAL ECHOCARDIOGRAM (TEE) (N/A)  Total Length of Stay:  LOS: 10 days   Subjective: Feels weak but better, less SOB  Objective: Vital signs in last 24 hours: Temp:  [98.1 F (36.7 C)-99.9 F (37.7 C)] 98.6 F (37 C) (05/02 0300) Pulse Rate:  [59-80] 59 (05/01 1600) Cardiac Rhythm: A-V Sequential paced;Sinus bradycardia (05/01 2133) Resp:  [10-23] 12 (05/02 0600) BP: (120-165)/(66-90) 139/86 (05/02 0600) SpO2:  [96 %-100 %] 98 % (05/02 0600) Arterial Line BP: (148-150)/(62-73) 150/62 (05/01 1100) Weight:  [95.6 kg] 95.6 kg (05/02 0500)  Filed Weights   03/09/21 0500 03/10/21 0500 03/11/21 0500  Weight: 92.5 kg 95.9 kg 95.6 kg    Weight change: -0.3 kg   Hemodynamic parameters for last 24 hours: PAP: (54-57)/(16-20) 56/16  Intake/Output from previous day: 05/01 0701 - 05/02 0700 In: 2846.7 [P.O.:630; I.V.:2216.7] Out: 2580 [Urine:1850; Chest Tube:730]  Intake/Output this shift: No intake/output data recorded.  Current Meds: Scheduled Meds: . acetaminophen  1,000 mg Oral Q6H   Or  . acetaminophen (TYLENOL) oral liquid 160 mg/5 mL  1,000 mg Per Tube Q6H  . acetaminophen  975 mg Rectal Q6H  . aspirin EC  325 mg Oral Daily   Or  . aspirin  324 mg Per Tube Daily  . bisacodyl  10 mg Oral Daily   Or  . bisacodyl  10 mg Rectal Daily  . Chlorhexidine Gluconate Cloth  6 each Topical Daily  . docusate sodium  200 mg Oral Daily  . enoxaparin (LOVENOX) injection  40 mg Subcutaneous QHS  . insulin aspart  0-24 Units Subcutaneous Q4H  . mouth rinse  15 mL Mouth Rinse BID  .  pantoprazole  40 mg Oral Daily  . rosuvastatin  10 mg Oral Daily  . sodium chloride flush  10-40 mL Intracatheter Q12H  . sodium chloride flush  3 mL Intravenous Q12H   Continuous Infusions: . sodium chloride Stopped (03/10/21 1110)  . sodium chloride    . sodium chloride 10 mL/hr at 03/09/21 0532  . dexmedetomidine (PRECEDEX) IV infusion Stopped (03/10/21 0232)  . electrolyte-A 75 mL/hr at 03/11/21 0600  . insulin Stopped (03/10/21 1048)  . lactated ringers    . lactated ringers    . lactated ringers 20 mL/hr at 03/11/21 0600  . milrinone 0.125 mcg/kg/min (03/11/21 0600)   PRN Meds:.sodium chloride, dextrose, lactated ringers, levalbuterol, metoprolol tartrate, midazolam, morphine injection, ondansetron (ZOFRAN) IV, oxyCODONE, sodium chloride flush, sodium chloride flush, traMADol  General appearance: alert, cooperative, fatigued and no distress Heart: regular rate and rhythm, no rub and no murmur Lungs: dim Left>right base Abdomen: soft, non tender Extremities: + edema Wound: incis healing well  Lab Results: CBC: Recent Labs    03/10/21 1049 03/11/21 0403  WBC 9.7 10.1  HGB 8.1* 7.9*  HCT 24.8* 25.1*  PLT 122* 128*   BMET:  Recent Labs    03/10/21 0418 03/10/21 1049 03/11/21 0403  NA 139  --  136  K 4.2  --  4.3  CL 108  --  102  CO2 22  --  28  GLUCOSE 150*  --  152*  BUN 27*  --  26*  CREATININE 1.11 0.97 0.75  CALCIUM 7.7*  --  8.0*    CMET: Lab Results  Component Value Date   WBC 10.1 03/11/2021   HGB 7.9 (L) 03/11/2021   HCT 25.1 (L) 03/11/2021   PLT 128 (L) 03/11/2021   GLUCOSE 152 (H) 03/11/2021   CHOL 153 12/28/2020   TRIG 259 (H) 12/28/2020   HDL 41 12/28/2020   LDLCALC 70 12/28/2020   ALT 21 03/08/2021   AST 22 03/08/2021   NA 136 03/11/2021   K 4.3 03/11/2021   CL 102 03/11/2021   CREATININE 0.75 03/11/2021   BUN 26 (H) 03/11/2021   CO2 28 03/11/2021   TSH 4.498 03/02/2021   PSA 1.8 08/31/2013   INR 1.4 (H) 03/11/2021   HGBA1C  6.7 (H) 03/06/2021   MICROALBUR negative 06/21/2015      PT/INR:  Recent Labs    03/11/21 0403  LABPROT 16.9*  INR 1.4*   Radiology: DG Chest Port 1 View  Result Date: 03/11/2021 CLINICAL DATA:  Pleural effusion EXAM: PORTABLE CHEST 1 VIEW COMPARISON:  Radiograph 03/10/2021, CT 03/01/2021 FINDINGS: A right IJ approach central venous catheter sheath terminates at the mid SVC Bilateral pleural and mediastinal drains are in place. Postoperative changes from sternotomy, bioprosthetic aortic valve replacement and occlusion of the left atrial appendage. Telemetry leads overlie the chest. Stable postoperative mediastinal widening. Some residual mid to lower lung opacities are similar to prior with pulmonary vascular congestion. Layering bilateral effusions, left greater than right, not significantly changed from prior. No pneumothorax. No acute osseous or soft tissue abnormality. IMPRESSION: Removal of the Swan-Ganz catheter with a right IJ sheath remaining in place. No other significant interval change from comparison exam with persistent mid to lower lung predominant opacities, layering effusions, left greater than right, and pulmonary vascular congestion. Stable mediastinal contours with cardiomegaly. Electronically Signed   By: Lovena Le M.D.   On: 03/11/2021 06:17     Assessment/Plan: S/P Procedure(s) (LRB): MITRAL VALVE REPAIR USING  PHYSIO RING SIZE 28 (EXPLANTED) THEN MITRAL VALVE REPLACEMENT (MVR) WITH MAGNA EASE MITRAL VALVE SIZE 25MM  ON PUMP, LEFT ATRIAL APPENDAGE CLIPPING USING A SIZE 50 ATRIAL CLIP (N/A) MAZE (N/A) TRANSESOPHAGEAL ECHOCARDIOGRAM (TEE) (N/A)  1 Tmax 99.9, SBP 120's to 160's,  paced- backup(40) , sinus brady 50's, Co-Ox 69 on .125 milrinone- continue, normal EF preop 2 sats good on 6 liters, some  3 CT 730 cc 24/h- keep in place, sero sang 4 reasonable UOP, wt up about 10 Kg, normal renal fxn- needs further diuretics- will re-order lasix 40 IV 5 no leukocytosis 6  expected ABLA stable, improving with fluid equalibration 7 thrombocytopenia trend improved 8 BS adeq control- preop A1C 6.7 (home meds glucophage and januvia) transition over time to them- cont SSI 9 INR 1.4-coumadin being started today 10 lovenox for DVT PPx 11 CXR   No other significant interval change from comparison exam with persistent mid to lower lung predominant opacities, layering effusions, left greater than right, and pulmonary vascular congestion. Stable mediastinal contours with cardiomegaly. 12 push rehab and pulm toilet as able    Michael Giovanni PA-C Pager [phone] 03/11/2021 7:12 AM   Pt seen and examined; agree with documentation. Chest tubes out; start warfarin Consider amiodarone oral load Txfer to tele  Percy Winterrowd Z. Orvan Seen, [place]

## 2021-03-12 ENCOUNTER — Inpatient Hospital Stay (HOSPITAL_COMMUNITY): Payer: Medicare Other

## 2021-03-12 DIAGNOSIS — I5033 Acute on chronic diastolic (congestive) heart failure: Secondary | ICD-10-CM | POA: Diagnosis not present

## 2021-03-12 LAB — CBC
HCT: 23 % — ABNORMAL LOW (ref 39.0–52.0)
Hemoglobin: 7.4 g/dL — ABNORMAL LOW (ref 13.0–17.0)
MCH: 29.6 pg (ref 26.0–34.0)
MCHC: 32.2 g/dL (ref 30.0–36.0)
MCV: 92 fL (ref 80.0–100.0)
Platelets: 130 10*3/uL — ABNORMAL LOW (ref 150–400)
RBC: 2.5 MIL/uL — ABNORMAL LOW (ref 4.22–5.81)
RDW: 13 % (ref 11.5–15.5)
WBC: 9 10*3/uL (ref 4.0–10.5)
nRBC: 0.3 % — ABNORMAL HIGH (ref 0.0–0.2)

## 2021-03-12 LAB — BASIC METABOLIC PANEL
Anion gap: 4 — ABNORMAL LOW (ref 5–15)
BUN: 25 mg/dL — ABNORMAL HIGH (ref 8–23)
CO2: 30 mmol/L (ref 22–32)
Calcium: 8 mg/dL — ABNORMAL LOW (ref 8.9–10.3)
Chloride: 100 mmol/L (ref 98–111)
Creatinine, Ser: 0.76 mg/dL (ref 0.61–1.24)
GFR, Estimated: >60 mL/min (ref >60)
Glucose, Bld: 148 mg/dL — ABNORMAL HIGH (ref 70–99)
Potassium: 3.6 mmol/L (ref 3.5–5.1)
Sodium: 134 mmol/L — ABNORMAL LOW (ref 135–145)

## 2021-03-12 LAB — GLUCOSE, CAPILLARY
Glucose-Capillary: 150 mg/dL — ABNORMAL HIGH (ref 70–99)
Glucose-Capillary: 146 mg/dL — ABNORMAL HIGH (ref 70–99)
Glucose-Capillary: 143 mg/dL — ABNORMAL HIGH (ref 70–99)
Glucose-Capillary: 141 mg/dL — ABNORMAL HIGH (ref 70–99)

## 2021-03-12 LAB — PROTIME-INR
INR: 1.3 — ABNORMAL HIGH (ref 0.8–1.2)
Prothrombin Time: 16 s — ABNORMAL HIGH (ref 11.4–15.2)

## 2021-03-12 MED ORDER — GUAIFENESIN 100 MG/5ML PO SOLN
5.0000 mL | ORAL | Status: DC | PRN
Start: 2021-03-12 — End: 2021-03-15
  Administered 2021-03-13 – 2021-03-15 (×9): 100 mg via ORAL
  Filled 2021-03-12 (×10): qty 5

## 2021-03-12 MED ORDER — INSULIN ASPART 100 UNIT/ML IJ SOLN
0.0000 [IU] | Freq: Three times a day (TID) | INTRAMUSCULAR | Status: DC
  Administered 2021-03-12 – 2021-03-13 (×3): 2 [IU] via SUBCUTANEOUS
  Administered 2021-03-13: 8 [IU] via SUBCUTANEOUS
  Administered 2021-03-14 – 2021-03-15 (×4): 2 [IU] via SUBCUTANEOUS

## 2021-03-12 MED ORDER — AMIODARONE HCL 200 MG PO TABS
200.0000 mg | ORAL_TABLET | Freq: Three times a day (TID) | ORAL | Status: DC
  Administered 2021-03-12 – 2021-03-15 (×10): 200 mg via ORAL
  Filled 2021-03-12 (×10): qty 1

## 2021-03-12 MED ORDER — WARFARIN SODIUM 2.5 MG PO TABS
2.5000 mg | ORAL_TABLET | Freq: Once | ORAL | Status: DC
  Filled 2021-03-12: qty 1

## 2021-03-12 MED ORDER — MENTHOL 3 MG MT LOZG
1.0000 | LOZENGE | OROMUCOSAL | Status: DC | PRN
  Administered 2021-03-12 – 2021-03-15 (×3): 3 mg via ORAL
  Filled 2021-03-12 (×3): qty 9

## 2021-03-12 MED ORDER — GUAIFENESIN ER 600 MG PO TB12
1200.0000 mg | ORAL_TABLET | Freq: Two times a day (BID) | ORAL | Status: DC
  Administered 2021-03-12 – 2021-03-15 (×7): 1200 mg via ORAL
  Filled 2021-03-12 (×7): qty 2

## 2021-03-12 MED ORDER — AMIODARONE HCL 200 MG PO TABS: 400.0000 mg | ORAL_TABLET | Freq: Three times a day (TID) | ORAL | Status: DC

## 2021-03-12 MED ORDER — POTASSIUM CHLORIDE CRYS ER 20 MEQ PO TBCR
20.0000 meq | EXTENDED_RELEASE_TABLET | Freq: Two times a day (BID) | ORAL | Status: AC
  Administered 2021-03-12 (×2): 20 meq via ORAL
  Filled 2021-03-12 (×2): qty 1

## 2021-03-12 MED ORDER — FE FUMARATE-B12-VIT C-FA-IFC PO CAPS
1.0000 | ORAL_CAPSULE | Freq: Three times a day (TID) | ORAL | Status: DC
  Administered 2021-03-12 – 2021-03-15 (×9): 1 via ORAL
  Filled 2021-03-12 (×13): qty 1

## 2021-03-12 MED ORDER — INSULIN ASPART 100 UNIT/ML IJ SOLN
0.0000 [IU] | INTRAMUSCULAR | Status: DC
Start: 2021-03-12 — End: 2021-03-12

## 2021-03-12 MED FILL — Heparin Sodium (Porcine) Inj 1000 Unit/ML: INTRAMUSCULAR | Qty: 30 | Status: AC

## 2021-03-12 MED FILL — Sodium Chloride IV Soln 0.9%: INTRAVENOUS | Qty: 4000 | Status: AC

## 2021-03-12 MED FILL — Calcium Chloride Inj 10%: INTRAVENOUS | Qty: 10 | Status: AC

## 2021-03-12 MED FILL — Electrolyte-R (PH 7.4) Solution: INTRAVENOUS | Qty: 4000 | Status: AC

## 2021-03-12 MED FILL — Sodium Bicarbonate IV Soln 8.4%: INTRAVENOUS | Qty: 50 | Status: AC

## 2021-03-12 NOTE — Plan of Care (Signed)

## 2021-03-12 NOTE — Progress Notes (Signed)
ANTICOAGULATION CONSULT NOTE - Follow Up Consult  Pharmacy Consult for warfarin  Indication: MVR  No Known Allergies  Patient Measurements: Height: 6' (182.9 cm) Weight: 95.2 kg (209 lb 14.1 oz) IBW/kg (Calculated) : 77.6   Vital Signs: Temp: 98.7 F (37.1 C) (05/03 0300) Temp Source: Oral (05/03 0300) BP: 109/61 (05/03 0300) Pulse Rate: 52 (05/03 0300)  Labs: Recent Labs    03/10/21 1049 03/11/21 0403 03/12/21 0259  HGB 8.1* 7.9* 7.4*  HCT 24.8* 25.1* 23.0*  PLT 122* 128* 130*  LABPROT  --  16.9* 16.0*  INR  --  1.4* 1.3*  CREATININE 0.97 0.75 0.76    Estimated Creatinine Clearance: 110.2 mL/min (by C-G formula based on SCr of 0.76 mg/dL).   Assessment: 65 yo M with PMH significant for severe mitral regurg + CHF now s/p mitral valve replacement on 4/29. Pharmacy consulted to dose warfarin for anticoagulation. No PTA anticoagulation. Marland Kitchen   CBC-- Hgb 7.4 (low stable), Plt 134 INR 1.3 (subtherapeutic)    Goal of Therapy:  INR 2-3 Monitor platelets by anticoagulation protocol: Yes   Plan:  Warfarin 2.5 mg PO at 1600 today x1 Daily INR  Hildred Laser, PharmD Clinical Pharmacist **Pharmacist phone directory can now be found on [email or web address] ([email or web address]).  Listed under [place].

## 2021-03-12 NOTE — Progress Notes (Signed)
CARDIAC REHAB PHASE I   PRE:  Rate/Rhythm: 60 SR  BP:  Supine:   Sitting: 132/81  Standing:    SaO2: 96% 3L  MODE:  Ambulation: 340 ft   POST:  Rate/Rhythm: 62 SR  BP:  Supine:   Sitting: 118/97  Standing:    SaO2: 2L 87% took 6L to keep sats up walking. Tried 3L then 4L and then 6L [id number] Pt encouraged to walk and get OOB. Pt walked 340 ft on oxygen with gait belt use, rolling walker and asst x 1. Started out on 2L but took bumping up to 6L to keep sats up. Encouraged pursed lip breathing. Stopped once to rest. C/o feeling tired. Encouraged to sit in recliner and I  unpacked IS and gave to pt. Encouraged him to walk with staff later. Call bell in reach . Back to 3L for rest.   Graylon Good, RN BSN  03/12/2021 11:12 AM

## 2021-03-12 NOTE — Progress Notes (Signed)
      [address]       [place] [address]             [phone]      4 Days Post-Op Procedure(s) (LRB): MITRAL VALVE REPAIR USING  PHYSIO RING SIZE 28 (EXPLANTED) THEN MITRAL VALVE REPLACEMENT (MVR) WITH MAGNA EASE MITRAL VALVE SIZE 25MM  ON PUMP, LEFT ATRIAL APPENDAGE CLIPPING USING A SIZE 50 ATRIAL CLIP (N/A) MAZE (N/A) TRANSESOPHAGEAL ECHOCARDIOGRAM (TEE) (N/A) Subjective: Feels okay this morning, not hungry but trying to eat.   Objective: Vital signs in last 24 hours: Temp:  [98.2 F (36.8 C)-98.7 F (37.1 C)] 98.7 F (37.1 C) (05/03 0300) Pulse Rate:  [52-60] 52 (05/03 0300) Cardiac Rhythm: Sinus bradycardia (05/03 0302) Resp:  [13-20] 13 (05/03 0300) BP: (109-150)/(61-84) 109/61 (05/03 0300) SpO2:  [94 %-100 %] 94 % (05/03 0300) Weight:  [95.2 kg] 95.2 kg (05/03 0500)     Intake/Output from previous day: 05/02 0701 - 05/03 0700 In: 339.1 [I.V.:339.1] Out: 985 [Urine:975; Chest Tube:10] Intake/Output this shift: No intake/output data recorded.  General appearance: alert, cooperative and no distress Heart: regular rate and rhythm, S1, S2 normal, no murmur, click, rub or gallop Lungs: clear to auscultation bilaterally Abdomen: soft, non-tender; bowel sounds normal; no masses,  no organomegaly Extremities: extremities normal, atraumatic, no cyanosis or edema Wound: clean and dry  Lab Results: Recent Labs    03/11/21 0403 03/12/21 0259  WBC 10.1 9.0  HGB 7.9* 7.4*  HCT 25.1* 23.0*  PLT 128* 130*   BMET:  Recent Labs    03/11/21 0403 03/12/21 0259  NA 136 134*  K 4.3 3.6  CL 102 100  CO2 28 30  GLUCOSE 152* 148*  BUN 26* 25*  CREATININE 0.75 0.76  CALCIUM 8.0* 8.0*    PT/INR:  Recent Labs    03/12/21 0259  LABPROT 16.0*  INR 1.3*   ABG    Component Value Date/Time   PHART 7.370 03/10/2021 0244   HCO3 20.8 03/10/2021 0244   TCO2 22 03/10/2021 0244   ACIDBASEDEF 4.0 (H) 03/10/2021 0244   O2SAT 69.4 03/11/2021 0403    CBG (last 3)  Recent Labs    03/11/21 2044 03/11/21 2323 03/12/21 0301  GLUCAP 183* 180* 150*    Assessment/Plan: S/P Procedure(s) (LRB): MITRAL VALVE REPAIR USING  PHYSIO RING SIZE 28 (EXPLANTED) THEN MITRAL VALVE REPLACEMENT (MVR) WITH MAGNA EASE MITRAL VALVE SIZE 25MM  ON PUMP, LEFT ATRIAL APPENDAGE CLIPPING USING A SIZE 50 ATRIAL CLIP (N/A) MAZE (N/A) TRANSESOPHAGEAL ECHOCARDIOGRAM (TEE) (N/A)  1. CV- NSR in the 60s, bradycardia over night. On Amio 400mg  TID, asa, statin,  remains on milrinone 0.125 2. Pulm-On 3L [place] with good oxygen saturation. CXR with bilateral pleural effusions. Remains 8kg over baseline, continue IV lasix with potassium replacement. 3. Renal-creatinine 0.76, potassium supplementation ordered 4. H and H 7.4/23.0, slowly dropping over the last few days. Will order Trinsicon 5. Coumadin 2.5mg  started, INR 1.3 6. Pain control-well controlled on current regimen  Plan: Will leave EPW one more day with bradycardia overnight. Decrease Amio, continue lasix. Encouraged ambulation around the unit. Encouraged use of incentive spirometer.    LOS: 11 days    Elgie Collard 03/12/2021

## 2021-03-12 NOTE — Progress Notes (Signed)
Progress Note  Patient Name: Argelio Granier Date of Encounter: 03/13/2021  [facility] Cardiologist: None   Subjective   Ambulated the hallway this morning and felt weak. Breathing improved. EPW being removed currently at bedside.  Inpatient Medications    Scheduled Meds: . acetaminophen  1,000 mg Oral Q6H   Or  . acetaminophen (TYLENOL) oral liquid 160 mg/5 mL  1,000 mg Per Tube Q6H  . amiodarone  200 mg Oral TID  . aspirin EC  81 mg Oral Daily  . bisacodyl  10 mg Oral Daily   Or  . bisacodyl  10 mg Rectal Daily  . Chlorhexidine Gluconate Cloth  6 each Topical Daily  . colchicine  0.3 mg Oral BID  . docusate sodium  200 mg Oral Daily  . ferrous [id number] C-folic acid  1 capsule Oral TID PC  . furosemide  40 mg Oral Daily  . guaiFENesin  1,200 mg Oral BID  . insulin aspart  0-24 Units Subcutaneous TID WC  . linagliptin  5 mg Oral Daily  . mouth rinse  15 mL Mouth Rinse BID  . metFORMIN  1,000 mg Oral BID WC  . pantoprazole  40 mg Oral Daily  . potassium chloride  20 mEq Oral TID  . rosuvastatin  10 mg Oral Daily  . warfarin  2.5 mg Oral ONCE-1600  . Warfarin - Pharmacist Dosing Inpatient   Does not apply q1600   Continuous Infusions: . lactated ringers     PRN Meds: dextrose, guaiFENesin, lactated ringers, levalbuterol, menthol-cetylpyridinium, metoprolol tartrate, ondansetron (ZOFRAN) IV, oxyCODONE, sodium chloride flush, traMADol   Vital Signs    Vitals:   03/13/21 0400 03/13/21 0500 03/13/21 0741 03/13/21 0920  BP: (!) 144/89  (!) 148/87   Pulse: (!) 58  66 61  Resp: 13  19 18   Temp: 98.7 F (37.1 C)  98.3 F (36.8 C)   TempSrc: Oral  Oral   SpO2: 96%  97% 92%  Weight:  96 kg    Height:        Intake/Output Summary (Last 24 hours) at 03/13/2021 0935 Last data filed at 03/13/2021 0600 Gross per 24 hour  Intake --  Output 1200 ml  Net -1200 ml   Last 3 Weights 03/13/2021 03/12/2021 03/11/2021  Weight (lbs) 211 lb 10.3 oz 209 lb 14.1 oz 210 lb  12.2 oz  Weight (kg) 96 kg 95.2 kg 95.6 kg      Telemetry    NSR with some AFib - Personally Reviewed  ECG    No new tracing - Personally Reviewed  Physical Exam   GEN: No acute distress. Laying in bed   Neck: No JVD Cardiac: RRR, no murmurs, rubs, or gallops. Sternotomy site c/d/i Respiratory: Clear to auscultation bilaterally. GI: Soft, nontender, non-distended  MS: Trace edema; No deformity. Neuro:  Nonfocal  Psych: Normal affect   Labs    High Sensitivity Troponin:   Recent Labs  Lab 03/01/21 1343 03/01/21 1602  TROPONINIHS 11 11      Chemistry Recent Labs  Lab 03/08/21 0436 03/08/21 0758 03/11/21 0403 03/12/21 0259 03/13/21 0027  NA 134*   < > 136 134* 134*  K 4.3   < > 4.3 3.6 3.6  CL 101   < > 102 100 102  CO2 24   < > 28 30 27   GLUCOSE 152*   < > 152* 148* 133*  BUN 23   < > 26* 25* 20  CREATININE 0.97   < >  0.75 0.76 0.70  CALCIUM 9.1   < > 8.0* 8.0* 8.1*  PROT 6.2*  --   --   --   --   ALBUMIN 3.3*  --   --   --   --   AST 22  --   --   --   --   ALT 21  --   --   --   --   ALKPHOS 38  --   --   --   --   BILITOT 0.9  --   --   --   --   GFRNONAA >60   < > >60 >60 >60  ANIONGAP 9   < > 6 4* 5   < > = values in this interval not displayed.     Hematology Recent Labs  Lab 03/10/21 1049 03/11/21 0403 03/12/21 0259  WBC 9.7 10.1 9.0  RBC 2.67* 2.68* 2.50*  HGB 8.1* 7.9* 7.4*  HCT 24.8* 25.1* 23.0*  MCV 92.9 93.7 92.0  MCH 30.3 29.5 29.6  MCHC 32.7 31.5 32.2  RDW 13.2 13.0 13.0  PLT 122* 128* 130*    BNPNo results for input(s): BNP, PROBNP in the last 168 hours.   DDimer No results for input(s): DDIMER in the last 168 hours.   Radiology    DG Chest 2 View  Result Date: 03/12/2021 CLINICAL DATA:  Recent cardiac surgery EXAM: CHEST - 2 VIEW COMPARISON:  03/11/2021 FINDINGS: Cardiac shadow remains enlarged. Postsurgical changes are again seen. Aortic calcifications are noted. Small effusions and bibasilar airspace opacities are  seen. Interval chest tube removal has been performed. No recurrent pneumothorax is seen. Right jugular sheath remains although kinked at the skin surface. IMPRESSION: Bibasilar atelectatic changes with associated effusions. No pneumothorax following chest tube removal. Electronically Signed   By: Inez Catalina M.D.   On: 03/12/2021 08:27    Cardiac Studies   TEE 03/05/21: IMPRESSIONS  1. Left ventricular ejection fraction, by estimation, is 60 to 65%. The  left ventricle has normal function.  2. Right ventricular systolic function is normal. The right ventricular  size is mildly enlarged.  3. Left atrial size was severely dilated. No left atrial/left atrial  appendage thrombus was detected.  4. Right atrial size was mildly dilated.  5. The aortic valve is tricuspid. Aortic valve regurgitation is not  visualized.  6. The mitral valve is abnormal. Flail P2. Severe mitral valve  regurgitation. MR severe by both 2D ERO and 3D VCA. Systolic flow reversal  in pulmonary veins, consistent with severe MR.  Cath 03/05/21:  The left ventricular systolic function is normal. The left ventricular ejection fraction is 55-65% by visual estimate.  LV end diastolic pressure is mildly elevated.  There is moderate (3+) mitral regurgitation.  Hemodynamic findings consistent with mild pulmonary hypertension.  Angiographically normal coronary arteries  SUMMARY  Angiographically Normal Coronary Arteries  Mild Pulmonary Hypertension with mean PAP 30 mmHg, PCWP 5 mmHg with LVEDP 15 mmHg.  Mild-moderately reduced Cardiac Output-Cardiac Index (Fick) 4.44-2.12.  RECOMMENDATIONS  Continue with evaluation for mitral valve surgery   Patient Profile     65 y.o. male  with a hx of DM, HTN, HLD, tobacco use, chronic back pain, fibromyalgia, atrial flutterand newly diagnosed severe MR and acute diastolic heart failure who is now s/p MVR with Dr. Orvan Seen.  Assessment & Plan    #Severe MR s/p  MVR: Patient with newly diagnosed severe MR and diastolic HF in that setting. Attempted mitral valve  repair, but ultimately required mitral valve replacement. Pre-op cath with non-obstructive CAD. -Off milrinone -Continue ASA 81mg  daily -Lasix changed to PO 40mg  daily -Warfarin started 5/2  #Afib/Flutter: CHADs-vasc 4 s/p MAZE procedure as above. Previously on eliquis.  -Warfarin per CV surgery -On amiodarone 200mg  TID  #HTN: -Holding in the immediate post-op period; given improvement in BP, can likely add back home meds slowly  #HLD: -Continue crestor 10mg  daily   For questions or updates, please contact [facility] Please consult [email or web address] for contact info under        Signed, Freada Bergeron, MD  03/13/2021, 9:35 AM

## 2021-03-13 ENCOUNTER — Other Ambulatory Visit: Payer: Self-pay | Admitting: Cardiology

## 2021-03-13 DIAGNOSIS — I5033 Acute on chronic diastolic (congestive) heart failure: Secondary | ICD-10-CM | POA: Diagnosis not present

## 2021-03-13 DIAGNOSIS — I059 Rheumatic mitral valve disease, unspecified: Secondary | ICD-10-CM

## 2021-03-13 LAB — GLUCOSE, CAPILLARY
Glucose-Capillary: 134 mg/dL — ABNORMAL HIGH (ref 70–99)
Glucose-Capillary: 211 mg/dL — ABNORMAL HIGH (ref 70–99)
Glucose-Capillary: 109 mg/dL — ABNORMAL HIGH (ref 70–99)
Glucose-Capillary: 125 mg/dL — ABNORMAL HIGH (ref 70–99)

## 2021-03-13 LAB — PROTIME-INR
INR: 1.3 — ABNORMAL HIGH (ref 0.8–1.2)
Prothrombin Time: 16.5 seconds — ABNORMAL HIGH (ref 11.4–15.2)

## 2021-03-13 LAB — BASIC METABOLIC PANEL
Anion gap: 5 (ref 5–15)
BUN: 20 mg/dL (ref 8–23)
CO2: 27 mmol/L (ref 22–32)
Calcium: 8.1 mg/dL — ABNORMAL LOW (ref 8.9–10.3)
Chloride: 102 mmol/L (ref 98–111)
Creatinine, Ser: 0.7 mg/dL (ref 0.61–1.24)
GFR, Estimated: >60 mL/min (ref >60)
Glucose, Bld: 133 mg/dL — ABNORMAL HIGH (ref 70–99)
Potassium: 3.6 mmol/L (ref 3.5–5.1)
Sodium: 134 mmol/L — ABNORMAL LOW (ref 135–145)

## 2021-03-13 MED ORDER — WARFARIN SODIUM 5 MG PO TABS: 5.0000 mg | ORAL_TABLET | Freq: Once | ORAL | Status: DC

## 2021-03-13 MED ORDER — FUROSEMIDE 40 MG PO TABS
40.0000 mg | ORAL_TABLET | Freq: Every day | ORAL | Status: DC
  Administered 2021-03-13 – 2021-03-15 (×3): 40 mg via ORAL
  Filled 2021-03-13 (×3): qty 1

## 2021-03-13 MED ORDER — LINAGLIPTIN 5 MG PO TABS
5.0000 mg | ORAL_TABLET | Freq: Every day | ORAL | Status: DC
  Administered 2021-03-13 – 2021-03-15 (×3): 5 mg via ORAL
  Filled 2021-03-13 (×3): qty 1

## 2021-03-13 MED ORDER — WARFARIN SODIUM 2.5 MG PO TABS: 2.5000 mg | ORAL_TABLET | Freq: Once | ORAL | Status: DC

## 2021-03-13 MED ORDER — POTASSIUM CHLORIDE CRYS ER 20 MEQ PO TBCR
20.0000 meq | EXTENDED_RELEASE_TABLET | Freq: Three times a day (TID) | ORAL | Status: AC
  Administered 2021-03-13 – 2021-03-14 (×6): 20 meq via ORAL
  Filled 2021-03-13 (×6): qty 1

## 2021-03-13 MED ORDER — APIXABAN 5 MG PO TABS
5.0000 mg | ORAL_TABLET | Freq: Two times a day (BID) | ORAL | Status: DC
  Administered 2021-03-13 – 2021-03-15 (×4): 5 mg via ORAL
  Filled 2021-03-13 (×4): qty 1

## 2021-03-13 MED ORDER — METFORMIN HCL 500 MG PO TABS
1000.0000 mg | ORAL_TABLET | Freq: Two times a day (BID) | ORAL | Status: DC
  Administered 2021-03-13 – 2021-03-15 (×4): 1000 mg via ORAL
  Filled 2021-03-13 (×4): qty 2

## 2021-03-13 NOTE — Plan of Care (Signed)

## 2021-03-13 NOTE — Progress Notes (Addendum)
ANTICOAGULATION CONSULT NOTE - Follow Up Consult  Pharmacy Consult for warfarin  Indication: MVR  No Known Allergies  Patient Measurements: Height: 6' (182.9 cm) Weight: 96 kg (211 lb 10.3 oz) IBW/kg (Calculated) : 77.6   Vital Signs: Temp: 98.3 F (36.8 C) (05/04 0741) Temp Source: Oral (05/04 0741) BP: 148/87 (05/04 0741) Pulse Rate: 61 (05/04 0920)  Labs: Recent Labs    03/10/21 1049 03/11/21 0403 03/12/21 0259 03/13/21 0027  HGB 8.1* 7.9* 7.4*  --   HCT 24.8* 25.1* 23.0*  --   PLT 122* 128* 130*  --   LABPROT  --  16.9* 16.0*  --   INR  --  1.4* 1.3*  --   CREATININE 0.97 0.75 0.76 0.70    Estimated Creatinine Clearance: 110.7 mL/min (by C-G formula based on SCr of 0.7 mg/dL).   Assessment: 65 yo M with PMH significant for severe mitral regurg + CHF now s/p mitral valve replacement on 4/29. Pharmacy consulted to dose warfarin for anticoagulation (first dose on 5/2) . No PTA anticoagulation. Marland Kitchen   CBC-- Hgb 7.4 (low stable), Plt 134 INR 1.3 (subtherapeutic)    Goal of Therapy:  INR 2-3 Monitor platelets by anticoagulation protocol: Yes   Plan:  Warfarin 2.5 mg PO at 1600 today x1.  Daily INR  Hildred Laser, PharmD Clinical Pharmacist **Pharmacist phone directory can now be found on [email or web address] ([email or web address]).  Listed under [place].   addendum -Changing to apixaban -SCr= 0.7, Wt= 96kg  Plan -Apixban 5mg  po bid  Hildred Laser, PharmD Clinical Pharmacist **Pharmacist phone directory can now be found on [email or web address] ([email or web address]).  Listed under [place].

## 2021-03-13 NOTE — Op Note (Signed)
Procedure(s): MITRAL VALVE REPAIR USING  PHYSIO RING SIZE 28 (EXPLANTED) THEN MITRAL VALVE REPLACEMENT (MVR) WITH MAGNA EASE MITRAL VALVE SIZE 25MM  ON PUMP, LEFT ATRIAL APPENDAGE CLIPPING USING A SIZE 50 ATRIAL CLIP MAZE TRANSESOPHAGEAL ECHOCARDIOGRAM (TEE) Procedure Note  Michael Valenzuela male 65 y.o. 03/08/21 Procedure(s) and Anesthesia Type:    * MITRAL VALVE REPAIR USING  PHYSIO RING SIZE 28 (EXPLANTED) THEN MITRAL VALVE REPLACEMENT (MVR) WITH MAGNA EASE MITRAL VALVE SIZE 25MM  ON PUMP, LEFT ATRIAL APPENDAGE CLIPPING USING A SIZE 50 ATRIAL CLIP - General    * MAZE - General    * TRANSESOPHAGEAL ECHOCARDIOGRAM (TEE) - General  Surgeon(s) and Role:    * Wonda Olds, MD - Primary   Indications: The patient was admitted to the hospital with a brief history of orthopnea and dyspnea on exertion.  His evaluation demonstrated new onset atrial fibrillation and severe mitral regurgitation.  He underwent left heart catheterization which was notable for clean coronaries.  He had good right heart cath numbers.  The patient is consented for mitral valve repair or replacement and by atrial ablation     Surgeon: Wonda Olds   Assistants: T.  Harriet Pho, PA-C  Anesthesia: General endotracheal anesthesia  ASA Class: 4    Procedure Detail  MITRAL VALVE REPAIR USING  PHYSIO RING SIZE 28 (EXPLANTED) THEN MITRAL VALVE REPLACEMENT (MVR) WITH MAGNA EASE MITRAL VALVE SIZE 25MM  ON PUMP, LEFT ATRIAL APPENDAGE CLIPPING USING A SIZE 50 ATRIAL CLIP, MAZE, TRANSESOPHAGEAL ECHOCARDIOGRAM (TEE)  After informed consent was obtained, the patient was taken the operating room and above listed date.  He is placed in the supine position on the operating table.  Anesthesia was induced by the general tracheal technique smoothly.  After confirming adequate anesthesia deep invasive monitoring lines were placed.  The anterior chest abdomen groins were cleansed and draped sterile field using ChloraPrep solution.  At  preoperative pause was performed.  A midline sternotomy incision was made and carried down to level sternum with electrocautery.  The sternum was divided in midline a sternal saw in standard fashion.  A sternal retractor was placed between the halves.  Full dose heparin was given intravenously.  The pericardium was opened and pericardial well was created.  The aorta and superior vena cava were then cannulated in standard fashion and once the activating clotting time was found to be greater than 400 seconds placed on cardiopulmonary bypass.  An additional venous cannula was inserted through the IVC.  The right-sided pulmonary veins were encircled and treated with RFA clamp.  A retrograde catheter was inserted to the right atrium to coronary sinus.  The aorta is crossclamped and 1500 cc of antegrade/retrograde cardioplegia was given.  The patient sustained a good arrest.  Intermittent dosing of cardioplegia given through the remainder of the procedure as needed.  The ligament of Ruthann Cancer was then divided electrocautery.  The left-sided pulmonary veins were encircled and treated with RFA.  A left atrial appendage clip was placed at the base of the left atrial appendage.  Caval snares were then brought down on the IVC and the SVC.  The right atrial free wall is open and RFA was used to create the lesion from a on the free wall the right atrium and cryo probe was used to create the caval tricuspid lesion.  Interatrial septum was opened.  The left atrial roof and the floor lesions were then created with cryo.  The mitral isthmus lesion was also done with cryo.  Next a self-retaining retractor to expose the mitral valve.  There was a ruptured chordae P2.  There was calcium extending from the posterior annulus and the P2 scallop onto the leaflet.  The anterior leaflet at first inspection did not appear grossly pathologic.  Circumferential sutures of 2-0 Ethibond were placed in a horizontal mattress fashion.  The anterior  leaf was used to size a complete ring at 28 mm physio two.  The ring was then brought onto the field and the previously placed annular sutures were brought through the sewing portion of the ring.  This was then tied to position.  The portion of P2 subtended by the ruptured cord was excised and reapproximated with a running suture of 4-0 Gore-Tex.  The valve was then tested.  There was a relatively redundant portion at this point of the anterior leaflet.  Attempts were made to place artificial cord but this was not successful in reducing the redundant and prolapsing A2.  The decision was made then to replace the valve.  The ring was excised.  Circumferential sutures of 2-0 Ethibond with pledgets on the atrial side were position.  These were then brought through the sewing cuff of a 25 mm Magna Ease mitral valve prosthesis.  The valve was lowered in position of the placing the sutures through the sewing cuff.  The valve seated well.  The sutures were tied with a core knot crimping device.  The the interatrial septum was closed with a running suture of 3-0 Prolene.  The right atrial free wall was then closed with a running suture of 3-0 Prolene.  A hotshot dose of cardioplegia was given.  De-airing procedures performed the aortic cross-clamp was removed.  Patient is then weaned from cardiopulmonary bypass without difficulty.  Heart was decannulated and protamine was given to reverse the heparin.  Once were happy with the degree of hemostasis chest tubes placed in the mediastinum and right pleural space.  The sternum was reapproximated using stainless her wires in a standard fashion the tissue overlying the sternum was copes irrigated repaired in layers.  The subcuticular suture reapproximated the skin and sterile dressings were applied.  All sponge instrument and needle counts were correct. Findings: Mixed pathology mitral valve including severely calcified posterior annulus extending onto the leaflet leaflet and  myxomatous anterior leaflet.          Drains: As above          Blood Given: none          Specimens: Portion of the P2 scallop and portion of the A2 scallop         Implants: 25 mm bioprosthetic valve        Complications:  * No complications entered in OR log *         Disposition: To ICU stable         Condition: stable

## 2021-03-13 NOTE — Progress Notes (Signed)
Physician Discharge Summary  Patient ID: Michael Valenzuela MRN: [id number] DOB/AGE: 1956/01/22 65 y.o.  Admit date: 03/01/2021 Discharge date: 03/18/2021  Admission Diagnoses: Congestive heart failure, acute on chronic with preserved ejection fraction in the setting of severe mitral prolapse  Discharge Diagnoses:  Principal Problem:   Acute on chronic heart failure with preserved ejection fraction (HFpEF)/in the setting of severe mitral valve prolapse Active Problems:   Fibromyalgia   Essential hypertension, benign   Hyperlipidemia with target LDL less than 100   Type 2 diabetes mellitus with other specified complication (HCC)   Pleural effusion   Atrial fibrillation (HCC)   Mitral valve posterior leaflet prolapse/-severe holosystolic prolapse of posterior leaflet of the mitral valve   Dyspnea and respiratory abnormalities   Mitral valve insufficiency   S/P MVR (mitral valve replacement)   Encounter for preoperative dental examination   Severe mitral regurgitation   Dental caries   Chronic apical periodontitis   Chronic periodontitis   Retained dental root   Accretions on teeth   Phobia of dental procedure   Defective dental restoration   Patient Active Problem List   Diagnosis Date Noted  . Encounter for preoperative dental examination   . Severe mitral regurgitation   . Dental caries   . Chronic apical periodontitis   . Chronic periodontitis   . Retained dental root   . Accretions on teeth   . Phobia of dental procedure   . Defective dental restoration   . S/P MVR (mitral valve replacement) 03/08/2021  . Mitral valve insufficiency   . Dyspnea and respiratory abnormalities   . 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 Acute on chronic heart failure with preserved ejection fraction (HFpEF)/in the setting of severe mitral valve prolapse 03/03/2021  . Pleural effusion 03/01/2021  . Atrial  fibrillation ([place]) 03/01/2021  . Hx of tobacco use, presenting hazards to health 12/15/2019  . BMI 30.0-30.9,adult 06/21/2015  . Type 2 diabetes mellitus with other specified complication ([place]) 28/31/5176  . Essential hypertension, benign 01/13/2013  . Hyperlipidemia with target LDL less than 100 01/13/2013  . Fibromyalgia   . Chronic low back pain   . Vitamin D deficiency      Referring: No ref. provider found Primary Care: Chevis Pretty, FNP Primary Cardiologist:No primary care provider on file- cathed by Glenetta Hew MD    HPI:   65 year old man with a history of fibromyalgia but otherwise in good health presented last week with relatively acute shortness of breath.  He presented to his primary care with the symptoms where he was diagnosed with new onset A. fib and treated with Lasix after discovery of hypoxia.  Objective evaluation demonstrated new systolic murmur and an echocardiogram demonstrating eccentric mitral regurgitation.  Transferred to [facility] and underwent left heart catheterization demonstrating clean coronaries.  Consultation is obtained for consideration of mitral valve surgery and atrial ablation.  Patient states that last night he experienced worse shortness of breath and required more diuretic.  This morning he is comfortable.  Hospital Course:   On 4/29, Mr. Polyak underwent a mitral valve replacement, MAZE and atrial appendage clip with Dr. Orvan Seen. He tolerated the procedure well and was transferred to the surgical ICU. POD 1 we continue to wean his vasoactive drips. We set his pacemaker to a back-up rate. We planned to extubated later that day. He was extubated that night and POD 2 he was on milrinone 0.125. We kept his chest tubes in  place due to output. We started a diuretic regimen for fluid overload. His pain was well controlled. We planned to slowly start coumadin for valve thrombus prophylaxis.  He has required an aggressive diuresis for volume overload.  He  has responded well to this.  Renal function has remained within normal limits clinical volume overload but has shown steady improvement.  It was determined that he should be loaded with amiodarone  as he had preoperative atrial fibrillation, and this was started with oral dosing.  I have monitored daily with his Coumadin adjusted over time.  Epicardial pacing wires were removed on postop day #4.  He continues with aggressive pulmonary toilet.  Blood sugars have been under good control and he will be resumed on his preoperative diabetic medication regimen.  He continues to have some intermittent episodes of rate controlled atrial fibrillation but is mostly in sinus rhythm.  He continues his amiodarone and has been changed to Eliquis from Coumadin.  His incisions continue to look good without evidence of infection.  He is tolerating routine cardiac rehab modalities using standard protocols.  At the time of discharge his oxygen was weaned.  He is maintaining good saturations on room air.  At the time of discharge the patient is felt to be quite stable.   Discharged Condition: good  Consults: cardiology  Significant Diagnostic Studies: Routine postoperative serial labs and chest x-ray  Treatments: surgery:  Procedure(s): MITRAL VALVE REPAIR USING  PHYSIO RING SIZE 28 (EXPLANTED) THEN MITRAL VALVE REPLACEMENT (MVR) WITH MAGNA EASE MITRAL VALVE SIZE 25MM  ON PUMP, LEFT ATRIAL APPENDAGE CLIPPING USING A SIZE 50 ATRIAL CLIP MAZE TRANSESOPHAGEAL ECHOCARDIOGRAM (TEE) Procedure Note  Stuart Mirabile male 65 y.o. 03/08/21 Procedure(s) and Anesthesia Type:    * MITRAL VALVE REPAIR USING  PHYSIO RING SIZE 28 (EXPLANTED) THEN MITRAL VALVE REPLACEMENT (MVR) WITH MAGNA EASE MITRAL VALVE SIZE 25MM  ON PUMP, LEFT ATRIAL APPENDAGE CLIPPING USING A SIZE 50 ATRIAL CLIP - General    * MAZE - General    * TRANSESOPHAGEAL ECHOCARDIOGRAM (TEE) - General  Surgeon(s) and Role:    Wonda Olds, MD -  Primary    Discharge Exam: Blood pressure 136/89, pulse 72, temperature 99.3 F (37.4 C), temperature source Oral, resp. rate 16, height 6' (1.829 m), weight 91.3 kg, SpO2 96 %.  General appearance: alert, cooperative and no distress Heart: regular rate and rhythm Lungs: min dim in bases Abdomen: benign Extremities: + LE edema Wound: incis healing well Disposition: Discharge disposition: 01-Home or Self Care       Discharge Instructions    Amb Referral to Cardiac Rehabilitation   Complete by: As directed    Referring to [facility] CRP 2   Diagnosis: Valve Replacement   Valve: Mitral   After initial evaluation and assessments completed: Virtual Based Care may be provided alone or in conjunction with Phase 2 Cardiac Rehab based on patient barriers.: Yes     Allergies as of 03/15/2021   No Known Allergies     Medication List    STOP taking these medications   amLODipine 10 MG tablet Commonly known as: NORVASC   felodipine 10 MG 24 hr tablet Commonly known as: PLENDIL   fenofibrate 160 MG tablet   hydrochlorothiazide 25 MG tablet Commonly known as: HYDRODIURIL     TAKE these medications   amiodarone 200 MG tablet Commonly known as: PACERONE Take 1 tablet (200 mg total) by mouth 3 (three) times daily.   aspirin EC 81 MG  tablet Take 1 tablet (81 mg total) by mouth daily.   colchicine 0.6 MG tablet Take 1/2 tablet (0.3 mg total) by mouth 2 (two) times daily.   Eliquis 5 MG Tabs tablet Generic drug: apixaban Take 1 tablet (5 mg total) by mouth 2 (two) times daily.   Ferocon capsule Generic drug: ferrous [id number] C-folic acid Take 1 capsule by mouth 2 (two) times daily after a meal.   furosemide 40 MG tablet Commonly known as: LASIX Take 1 tablet (40 mg total) by mouth daily.   losartan 100 MG tablet Commonly known as: COZAAR TAKE 1 TABLET(100 MG) BY MOUTH DAILY   metFORMIN 1000 MG tablet Commonly known as: GLUCOPHAGE Take 1 tablet  (1,000 mg total) by mouth 2 (two) times daily with a meal. TAKE 1 TABLET(1000 MG) BY MOUTH TWICE DAILY WITH A MEAL   oxyCODONE 15 MG immediate release tablet Commonly known as: ROXICODONE Take 15 mg by mouth every 6 (six) hours as needed for pain.   potassium chloride SA 20 MEQ tablet Commonly known as: KLOR-CON Take 1 tablet (20 mEq total) by mouth daily.   rosuvastatin 10 MG tablet Commonly known as: CRESTOR Take 1 tablet (10 mg total) by mouth daily.   sitaGLIPtin 100 MG tablet Commonly known as: Januvia TAKE 1 TABLET(100 MG) BY MOUTH DAILY What changed:   how much to take  how to take this  when to take this       Follow-up Information    Atkins, Glenice Bow, MD Follow up.   Specialty: Cardiothoracic Surgery Why: Please see discharge paperwork for follow-up appointment with surgeon. Contact information: [address] [place] [address] [phone]        Leonie Man, MD Follow up.   Specialty: Cardiology Why: Please see discharge paperwork for follow-up appointment with cardiology. Contact information: [address] [place] [address] [place], [place] [facility] Follow up.   Why: [employer] will be providing a rolling walker to the hospital room. Contact information: [address] [place] [address] [phone]               Signed: John Giovanni PA-C 03/18/2021, 11:02 AM

## 2021-03-13 NOTE — Progress Notes (Signed)
CARDIAC REHAB PHASE I   PRE:  Rate/Rhythm: 62 SR  BP:  Supine:   Sitting: 152/90  Standing:    SaO2: 94%RA  MODE:  Ambulation: 200 ft   POST:  Rate/Rhythm: 68 SR  BP:  Supine:   Sitting: 158/78  Standing:    SaO2: 93-94%RA [id number] Pt walked 200 ft on RA with rolling walker and asst x 1. Gait steady. Stopped twice to catch his breath. Sats at that time 93-94%. To recliner but then back to bed for pacing wire removal. Pt would like rolling walker for home use. RN aware.  Encouraged IS and more walks today.   Graylon Good, RN BSN  03/13/2021 9:20 AM

## 2021-03-13 NOTE — Progress Notes (Signed)
Progress Note  Patient Name: Michael Valenzuela Date of Encounter: 03/14/2021  [employer] Cardiologist: None   Subjective   Complains of coughing a lot last night, but improved this morning. Has back pain from sleeping in the bed. States he is ready to go home tomorrow.  Inpatient Medications    Scheduled Meds: . amiodarone  200 mg Oral TID  . apixaban  5 mg Oral BID  . aspirin EC  81 mg Oral Daily  . bisacodyl  10 mg Oral Daily   Or  . bisacodyl  10 mg Rectal Daily  . Chlorhexidine Gluconate Cloth  6 each Topical Daily  . colchicine  0.3 mg Oral BID  . docusate sodium  200 mg Oral Daily  . ferrous [id number] C-folic acid  1 capsule Oral TID PC  . furosemide  40 mg Oral Daily  . guaiFENesin  1,200 mg Oral BID  . insulin aspart  0-24 Units Subcutaneous TID WC  . linagliptin  5 mg Oral Daily  . mouth rinse  15 mL Mouth Rinse BID  . metFORMIN  1,000 mg Oral BID WC  . pantoprazole  40 mg Oral Daily  . potassium chloride  20 mEq Oral TID  . rosuvastatin  10 mg Oral Daily   Continuous Infusions: . lactated ringers     PRN Meds: dextrose, guaiFENesin, lactated ringers, levalbuterol, menthol-cetylpyridinium, metoprolol tartrate, ondansetron (ZOFRAN) IV, oxyCODONE, sodium chloride flush, traMADol   Vital Signs    Vitals:   03/13/21 1600 03/13/21 1935 03/13/21 2300 03/14/21 0300  BP: 129/69 (!) 147/87 (!) 147/99 140/76  Pulse: 70 71 68 71  Resp: 14 13 18 18   Temp: 98.8 F (37.1 C) 99.1 F (37.3 C) 98.9 F (37.2 C) 99.1 F (37.3 C)  TempSrc: Oral Oral Oral Oral  SpO2: 90% 91% 99% 95%  Weight:    97.4 kg  Height:        Intake/Output Summary (Last 24 hours) at 03/14/2021 0727 Last data filed at 03/14/2021 0500 Gross per 24 hour  Intake 560 ml  Output 2250 ml  Net -1690 ml   Last 3 Weights 03/14/2021 03/13/2021 03/12/2021  Weight (lbs) 214 lb 11.7 oz 211 lb 10.3 oz 209 lb 14.1 oz  Weight (kg) 97.4 kg 96 kg 95.2 kg      Telemetry    Sinus and rate controlled  Afib/flutter - Personally Reviewed  ECG    No new tracing - Personally Reviewed  Physical Exam   GEN: No acute distress. Sitting up in bed   Neck: No JVD Cardiac: Irregularly irregular, no murmurs, rubs, or gallops.  Respiratory: Faint crackles at right lung base. Otherwise clear GI: Soft, nontender, non-distended  MS: Trace edema, warm Neuro:  Nonfocal  Psych: Normal affect   Labs    High Sensitivity Troponin:   Recent Labs  Lab 03/01/21 1343 03/01/21 1602  TROPONINIHS 11 11      Chemistry Recent Labs  Lab 03/08/21 0436 03/08/21 0758 03/12/21 0259 03/13/21 0027 03/14/21 0051  NA 134*   < > 134* 134* 136  K 4.3   < > 3.6 3.6 4.1  CL 101   < > 100 102 103  CO2 24   < > 30 27 26   GLUCOSE 152*   < > 148* 133* 118*  BUN 23   < > 25* 20 12  CREATININE 0.97   < > 0.76 0.70 0.63  CALCIUM 9.1   < > 8.0* 8.1* 8.3*  PROT 6.2*  --   --   --   --  ALBUMIN 3.3*  --   --   --   --   AST 22  --   --   --   --   ALT 21  --   --   --   --   ALKPHOS 38  --   --   --   --   BILITOT 0.9  --   --   --   --   GFRNONAA >60   < > >60 >60 >60  ANIONGAP 9   < > 4* 5 7   < > = values in this interval not displayed.     Hematology Recent Labs  Lab 03/10/21 1049 03/11/21 0403 03/12/21 0259  WBC 9.7 10.1 9.0  RBC 2.67* 2.68* 2.50*  HGB 8.1* 7.9* 7.4*  HCT 24.8* 25.1* 23.0*  MCV 92.9 93.7 92.0  MCH 30.3 29.5 29.6  MCHC 32.7 31.5 32.2  RDW 13.2 13.0 13.0  PLT 122* 128* 130*    BNPNo results for input(s): BNP, PROBNP in the last 168 hours.   DDimer No results for input(s): DDIMER in the last 168 hours.   Radiology    DG Chest 2 View  Result Date: 03/12/2021 CLINICAL DATA:  Recent cardiac surgery EXAM: CHEST - 2 VIEW COMPARISON:  03/11/2021 FINDINGS: Cardiac shadow remains enlarged. Postsurgical changes are again seen. Aortic calcifications are noted. Small effusions and bibasilar airspace opacities are seen. Interval chest tube removal has been performed. No recurrent  pneumothorax is seen. Right jugular sheath remains although kinked at the skin surface. IMPRESSION: Bibasilar atelectatic changes with associated effusions. No pneumothorax following chest tube removal. Electronically Signed   By: Inez Catalina M.D.   On: 03/12/2021 08:27    Cardiac Studies   TEE 03/05/21: IMPRESSIONS  1. Left ventricular ejection fraction, by estimation, is 60 to 65%. The  left ventricle has normal function.  2. Right ventricular systolic function is normal. The right ventricular  size is mildly enlarged.  3. Left atrial size was severely dilated. No left atrial/left atrial  appendage thrombus was detected.  4. Right atrial size was mildly dilated.  5. The aortic valve is tricuspid. Aortic valve regurgitation is not  visualized.  6. The mitral valve is abnormal. Flail P2. Severe mitral valve  regurgitation. MR severe by both 2D ERO and 3D VCA. Systolic flow reversal  in pulmonary veins, consistent with severe MR.  Cath 03/05/21:  The left ventricular systolic function is normal. The left ventricular ejection fraction is 55-65% by visual estimate.  LV end diastolic pressure is mildly elevated.  There is moderate (3+) mitral regurgitation.  Hemodynamic findings consistent with mild pulmonary hypertension.  Angiographically normal coronary arteries  SUMMARY  Angiographically Normal Coronary Arteries  Mild Pulmonary Hypertension with mean PAP 30 mmHg, PCWP 5 mmHg with LVEDP 15 mmHg.  Mild-moderately reduced Cardiac Output-Cardiac Index (Fick) 4.44-2.12.  RECOMMENDATIONS  Continue with evaluation for mitral valve surgery   Patient Profile     65 y.o. male with a hx of DM, HTN, HLD, tobacco use, chronic back pain, fibromyalgia, atrial flutterand newly diagnosed severe MR and acute diastolic heart failure who is now s/p MVR with Dr. Orvan Seen.  Assessment & Plan    #Severe MR s/p MVR: Patient with newly diagnosed severe MR and diastolic HF in that  setting. Attempted mitral valve repair, but ultimately required mitral valve replacement. Pre-op cath with non-obstructive CAD. -Off milrinone -Continue ASA 81mg  daily -Lasix changed to PO 40mg  daily -Warfarin changed to apixaban  #Afib/Flutter:  CHADs-vasc 4 s/p MAZE procedure as above. Intermittently in Afib/flutter but rates controlled.  -Changed from warfarin to apixaban -On amiodarone 200mg  TID; will taper as out-patient  #HTN: Elevated today. -Resume home losartan at reduced dose of 50mg  -Will slowly add back home medications as tolerated  #HLD: -Continue crestor 10mg  daily      For questions or updates, please contact [facility] Please consult [email or web address] for contact info under        Signed, Freada Bergeron, MD  03/14/2021, 7:27 AM

## 2021-03-13 NOTE — Plan of Care (Signed)
  Problem: Education: Goal: Knowledge of General Education information will improve Description: Including pain rating scale, medication(s)/side effects and non-pharmacologic comfort measures Outcome: Progressing   Problem: Clinical Measurements: Goal: Ability to maintain clinical measurements within normal limits will improve Outcome: Progressing Goal: Will remain free from infection Outcome: Progressing Goal: Respiratory complications will improve Outcome: Progressing Goal: Cardiovascular complication will be avoided Outcome: Progressing   Problem: Activity: Goal: Risk for activity intolerance will decrease Outcome: Progressing   Problem: Nutrition: Goal: Adequate nutrition will be maintained Outcome: Progressing   Problem: Pain Managment: Goal: General experience of comfort will improve Outcome: Progressing

## 2021-03-13 NOTE — Progress Notes (Addendum)
[address]       [place] [address]             [phone]      5 Days Post-Op Procedure(s) (LRB): MITRAL VALVE REPAIR USING  PHYSIO RING SIZE 28 (EXPLANTED) THEN MITRAL VALVE REPLACEMENT (MVR) WITH MAGNA EASE MITRAL VALVE SIZE 25MM  ON PUMP, LEFT ATRIAL APPENDAGE CLIPPING USING A SIZE 50 ATRIAL CLIP (N/A) MAZE (N/A) TRANSESOPHAGEAL ECHOCARDIOGRAM (TEE) (N/A) Subjective: Pain is biggest complaint  Objective: Vital signs in last 24 hours: Temp:  [98.2 F (36.8 C)-98.7 F (37.1 C)] 98.7 F (37.1 C) (05/04 0400) Pulse Rate:  [55-62] 58 (05/04 0400) Cardiac Rhythm: Atrial fibrillation (05/04 0450) Resp:  [13-19] 13 (05/04 0400) BP: (118-149)/(73-97) 144/89 (05/04 0400) SpO2:  [86 %-97 %] 96 % (05/04 0400) Weight:  [96 kg] 96 kg (05/04 0500)  Hemodynamic parameters for last 24 hours:    Intake/Output from previous day: 05/03 0701 - 05/04 0700 In: -  Out: 1525 [Urine:1525] Intake/Output this shift: No intake/output data recorded.  General appearance: alert, cooperative and no distress Heart: regular rate and rhythm Lungs: clear to auscultation bilaterally Abdomen: benign Extremities: trace edema Wound: incis healing well  Lab Results: Recent Labs    03/11/21 0403 03/12/21 0259  WBC 10.1 9.0  HGB 7.9* 7.4*  HCT 25.1* 23.0*  PLT 128* 130*   BMET:  Recent Labs    03/12/21 0259 03/13/21 0027  NA 134* 134*  K 3.6 3.6  CL 100 102  CO2 30 27  GLUCOSE 148* 133*  BUN 25* 20  CREATININE 0.76 0.70  CALCIUM 8.0* 8.1*    PT/INR:  Recent Labs    03/12/21 0259  LABPROT 16.0*  INR 1.3*   ABG    Component Value Date/Time   PHART 7.370 03/10/2021 0244   HCO3 20.8 03/10/2021 0244   TCO2 22 03/10/2021 0244   ACIDBASEDEF 4.0 (H) 03/10/2021 0244   O2SAT 69.4 03/11/2021 0403   CBG (last 3)  Recent Labs    03/12/21 1607 03/12/21 2121 03/13/21 0614  GLUCAP 143* 141* 134*    Meds Scheduled Meds: . acetaminophen  1,000 mg Oral  Q6H   Or  . acetaminophen (TYLENOL) oral liquid 160 mg/5 mL  1,000 mg Per Tube Q6H  . amiodarone  200 mg Oral TID  . aspirin EC  81 mg Oral Daily  . bisacodyl  10 mg Oral Daily   Or  . bisacodyl  10 mg Rectal Daily  . Chlorhexidine Gluconate Cloth  6 each Topical Daily  . colchicine  0.3 mg Oral BID  . docusate sodium  200 mg Oral Daily  . ferrous [id number] C-folic acid  1 capsule Oral TID PC  . furosemide  40 mg Intravenous BID  . guaiFENesin  1,200 mg Oral BID  . insulin aspart  0-24 Units Subcutaneous TID WC  . mouth rinse  15 mL Mouth Rinse BID  . pantoprazole  40 mg Oral Daily  . rosuvastatin  10 mg Oral Daily  . warfarin  2.5 mg Oral ONCE-1600  . Warfarin - Pharmacist Dosing Inpatient   Does not apply q1600   Continuous Infusions: . lactated ringers     PRN Meds:.dextrose, guaiFENesin, lactated ringers, levalbuterol, menthol-cetylpyridinium, metoprolol tartrate, ondansetron (ZOFRAN) IV, oxyCODONE, sodium chloride flush, traMADol  Xrays DG Chest 2 View  Result Date: 03/12/2021 CLINICAL DATA:  Recent cardiac surgery EXAM: CHEST - 2 VIEW COMPARISON:  03/11/2021 FINDINGS: Cardiac shadow remains enlarged.  Postsurgical changes are again seen. Aortic calcifications are noted. Small effusions and bibasilar airspace opacities are seen. Interval chest tube removal has been performed. No recurrent pneumothorax is seen. Right jugular sheath remains although kinked at the skin surface. IMPRESSION: Bibasilar atelectatic changes with associated effusions. No pneumothorax following chest tube removal. Electronically Signed   By: Inez Catalina M.D.   On: 03/12/2021 08:27    Assessment/Plan: S/P Procedure(s) (LRB): MITRAL VALVE REPAIR USING  PHYSIO RING SIZE 28 (EXPLANTED) THEN MITRAL VALVE REPLACEMENT (MVR) WITH MAGNA EASE MITRAL VALVE SIZE 25MM  ON PUMP, LEFT ATRIAL APPENDAGE CLIPPING USING A SIZE 50 ATRIAL CLIP (N/A) MAZE (N/A) TRANSESOPHAGEAL ECHOCARDIOGRAM (TEE) (N/A)  1 afeb,  SBP 110's-140's rare atrial fibrillation, brady at times, on amio 200 TID, no beta blocker or other neg chronotropes 2 sats good on 0-2 RA 3 normal renal fxn 4 BS well controlled, will resume home meds, glucophage and januvia 5 fair UOP- may not be accurate- weight still about 10 Kg over preop - clinically does not appear significantly volume overloaded- will reduce to 40 po q day for now 6 will order INR- daily 7 will replace K+ to get >4 8 d/c epw's if INR not too high 9 poss home 1-2 days   LOS: 12 days    John Giovanni PA-C Pager [phone] 03/13/2021 Pt seen and examined; agree with PA documentation. Should be able to go home in day or so. Does not need to be fully anticoagulated prior to d/c. Dakoda Bassette Z. Orvan Seen, [place]

## 2021-03-14 ENCOUNTER — Other Ambulatory Visit (HOSPITAL_COMMUNITY): Payer: Self-pay

## 2021-03-14 DIAGNOSIS — I5033 Acute on chronic diastolic (congestive) heart failure: Secondary | ICD-10-CM | POA: Diagnosis not present

## 2021-03-14 LAB — BASIC METABOLIC PANEL
Anion gap: 7 (ref 5–15)
BUN: 12 mg/dL (ref 8–23)
CO2: 26 mmol/L (ref 22–32)
Calcium: 8.3 mg/dL — ABNORMAL LOW (ref 8.9–10.3)
Chloride: 103 mmol/L (ref 98–111)
Creatinine, Ser: 0.63 mg/dL (ref 0.61–1.24)
GFR, Estimated: >60 mL/min (ref >60)
Glucose, Bld: 118 mg/dL — ABNORMAL HIGH (ref 70–99)
Potassium: 4.1 mmol/L (ref 3.5–5.1)
Sodium: 136 mmol/L (ref 135–145)

## 2021-03-14 LAB — GLUCOSE, CAPILLARY
Glucose-Capillary: 134 mg/dL — ABNORMAL HIGH (ref 70–99)
Glucose-Capillary: 140 mg/dL — ABNORMAL HIGH (ref 70–99)
Glucose-Capillary: 135 mg/dL — ABNORMAL HIGH (ref 70–99)
Glucose-Capillary: 118 mg/dL — ABNORMAL HIGH (ref 70–99)

## 2021-03-14 MED ORDER — LOSARTAN POTASSIUM 50 MG PO TABS
50.0000 mg | ORAL_TABLET | Freq: Every day | ORAL | Status: DC
  Administered 2021-03-14: 50 mg via ORAL
  Filled 2021-03-14: qty 1

## 2021-03-14 NOTE — Progress Notes (Addendum)
[address]       [place] [address]             [phone]      6 Days Post-Op Procedure(s) (LRB): MITRAL VALVE REPAIR USING  PHYSIO RING SIZE 28 (EXPLANTED) THEN MITRAL VALVE REPLACEMENT (MVR) WITH MAGNA EASE MITRAL VALVE SIZE 25MM  ON PUMP, LEFT ATRIAL APPENDAGE CLIPPING USING A SIZE 50 ATRIAL CLIP (N/A) MAZE (N/A) TRANSESOPHAGEAL ECHOCARDIOGRAM (TEE) (N/A) Subjective:  had a "rough night " with a lot of coughing  Objective: Vital signs in last 24 hours: Temp:  [98.3 F (36.8 C)-99.2 F (37.3 C)] 99.1 F (37.3 C) (05/05 0300) Pulse Rate:  [59-71] 71 (05/05 0300) Cardiac Rhythm: Normal sinus rhythm (05/05 0408) Resp:  [12-19] 18 (05/05 0300) BP: (121-148)/(69-99) 140/76 (05/05 0300) SpO2:  [90 %-99 %] 95 % (05/05 0300) Weight:  [97.4 kg] 97.4 kg (05/05 0300)  Hemodynamic parameters for last 24 hours:    Intake/Output from previous day: 05/04 0701 - 05/05 0700 In: 560 [P.O.:560] Out: 2250 [Urine:2250] Intake/Output this shift: No intake/output data recorded.  General appearance: alert, cooperative and no distress Heart: regular rate and rhythm Lungs: mildly dim in bases Abdomen: benign Extremities: minor edema Wound: incis healing well  Lab Results: Recent Labs    03/12/21 0259  WBC 9.0  HGB 7.4*  HCT 23.0*  PLT 130*   BMET:  Recent Labs    03/13/21 0027 03/14/21 0051  NA 134* 136  K 3.6 4.1  CL 102 103  CO2 27 26  GLUCOSE 133* 118*  BUN 20 12  CREATININE 0.70 0.63  CALCIUM 8.1* 8.3*    PT/INR:  Recent Labs    03/13/21 1025  LABPROT 16.5*  INR 1.3*   ABG    Component Value Date/Time   PHART 7.370 03/10/2021 0244   HCO3 20.8 03/10/2021 0244   TCO2 22 03/10/2021 0244   ACIDBASEDEF 4.0 (H) 03/10/2021 0244   O2SAT 69.4 03/11/2021 0403   CBG (last 3)  Recent Labs    03/13/21 1559 03/13/21 2103 03/14/21 0534  GLUCAP 109* 125* 134*    Meds Scheduled Meds: . amiodarone  200 mg Oral TID  . apixaban  5 mg  Oral BID  . aspirin EC  81 mg Oral Daily  . bisacodyl  10 mg Oral Daily   Or  . bisacodyl  10 mg Rectal Daily  . Chlorhexidine Gluconate Cloth  6 each Topical Daily  . colchicine  0.3 mg Oral BID  . docusate sodium  200 mg Oral Daily  . ferrous [id number] C-folic acid  1 capsule Oral TID PC  . furosemide  40 mg Oral Daily  . guaiFENesin  1,200 mg Oral BID  . insulin aspart  0-24 Units Subcutaneous TID WC  . linagliptin  5 mg Oral Daily  . mouth rinse  15 mL Mouth Rinse BID  . metFORMIN  1,000 mg Oral BID WC  . pantoprazole  40 mg Oral Daily  . potassium chloride  20 mEq Oral TID  . rosuvastatin  10 mg Oral Daily   Continuous Infusions: . lactated ringers     PRN Meds:.dextrose, guaiFENesin, lactated ringers, levalbuterol, menthol-cetylpyridinium, metoprolol tartrate, ondansetron (ZOFRAN) IV, oxyCODONE, sodium chloride flush, traMADol  Xrays DG Chest 2 View  Result Date: 03/12/2021 CLINICAL DATA:  Recent cardiac surgery EXAM: CHEST - 2 VIEW COMPARISON:  03/11/2021 FINDINGS: Cardiac shadow remains enlarged. Postsurgical changes are again seen. Aortic calcifications are noted. Small effusions  and bibasilar airspace opacities are seen. Interval chest tube removal has been performed. No recurrent pneumothorax is seen. Right jugular sheath remains although kinked at the skin surface. IMPRESSION: Bibasilar atelectatic changes with associated effusions. No pneumothorax following chest tube removal. Electronically Signed   By: Inez Catalina M.D.   On: 03/12/2021 08:27    Assessment/Plan: S/P Procedure(s) (LRB): MITRAL VALVE REPAIR USING  PHYSIO RING SIZE 28 (EXPLANTED) THEN MITRAL VALVE REPLACEMENT (MVR) WITH MAGNA EASE MITRAL VALVE SIZE 25MM  ON PUMP, LEFT ATRIAL APPENDAGE CLIPPING USING A SIZE 50 ATRIAL CLIP (N/A) MAZE (N/A) TRANSESOPHAGEAL ECHOCARDIOGRAM (TEE) (N/A)  1 afeb SBP 120's to 140's, HR 50's to 70's , sinus rhythm, with some rate controlled afib, cont po amio 2 sats  ok on RA 3 CBG adeq control- home meds at d/c 4 normal renal fxn 5 changed back to apixaban from coumadin 6 cont current diuretics 7 family can't pick him up till tomorrow, but not unreasonable to monitor one more day   LOS: 13 days    John Giovanni PA-C Pager [phone] 03/14/2021   Agree with above [place]

## 2021-03-14 NOTE — TOC Benefit Eligibility Note (Signed)
Patient Advocate Encounter  Insurance verification completed.    The patient is currently admitted and upon discharge could be taking Eliquis 5 mg.  The current 30 day co-pay is, $9.85.   The patient is insured through [employer] Medicare Part D    [name], CPhT Pharmacy Patient Advocate Specialist [place] [facility]wardship Team Direct Number: [phone]  Fax: [phone]        

## 2021-03-14 NOTE — Progress Notes (Signed)
CARDIAC REHAB PHASE I   PRE:  Rate/Rhythm: 73 SR  BP:  Supine: 156/83  Sitting:   Standing:    SaO2: 96%RA  MODE:  Ambulation: 340 ft   POST:  Rate/Rhythm: 74 SR  BP:  Supine:   Sitting: 151/83  Standing:    SaO2: 94%RA 0937-1010 Pt c/o back pain from fibromyalgia. Pt walked 340 ft on RA with rolling walker and minimal asst. Would recommend rolling walker for home use. Using pillow when he needs to cough. Changed both gowns as wet from sweating. Stopped several times to catch his breath. Sats monitored whole walk and at 94-95%RA. To recliner with call bell. Will educate tomorrow. Adheres to sternal precautions.   Graylon Good, RN BSN  03/14/2021 10:06 AM

## 2021-03-14 NOTE — Plan of Care (Signed)

## 2021-03-14 NOTE — Progress Notes (Addendum)
Progress Note  Patient Name: Michael Valenzuela Date of Encounter: 03/14/2021  [facility] Cardiologist: None   Subjective   Feels well this morning. Excited to go home today.  Inpatient Medications    Scheduled Meds: . amiodarone  200 mg Oral TID  . apixaban  5 mg Oral BID  . aspirin EC  81 mg Oral Daily  . bisacodyl  10 mg Oral Daily   Or  . bisacodyl  10 mg Rectal Daily  . Chlorhexidine Gluconate Cloth  6 each Topical Daily  . colchicine  0.3 mg Oral BID  . docusate sodium  200 mg Oral Daily  . ferrous [id number] C-folic acid  1 capsule Oral TID PC  . furosemide  40 mg Oral Daily  . guaiFENesin  1,200 mg Oral BID  . insulin aspart  0-24 Units Subcutaneous TID WC  . linagliptin  5 mg Oral Daily  . losartan  50 mg Oral Daily  . mouth rinse  15 mL Mouth Rinse BID  . metFORMIN  1,000 mg Oral BID WC  . pantoprazole  40 mg Oral Daily  . potassium chloride  20 mEq Oral TID  . rosuvastatin  10 mg Oral Daily   Continuous Infusions: . lactated ringers     PRN Meds: dextrose, guaiFENesin, lactated ringers, levalbuterol, menthol-cetylpyridinium, metoprolol tartrate, ondansetron (ZOFRAN) IV, oxyCODONE, sodium chloride flush, traMADol   Vital Signs    Vitals:   03/13/21 2300 03/14/21 0300 03/14/21 0733 03/14/21 1109  BP: (!) 147/99 140/76 (!) 149/90 139/81  Pulse: 68 71 73 72  Resp: 18 18 17    Temp:  99.1 F (37.3 C) 98.2 F (36.8 C) 99.5 F (37.5 C)  TempSrc:  Oral Axillary Oral  SpO2: 99% 95% 95% 93%  Weight:  97.4 kg    Height:        Intake/Output Summary (Last 24 hours) at 03/14/2021 1517 Last data filed at 03/14/2021 1430 Gross per 24 hour  Intake 580 ml  Output 2375 ml  Net -1795 ml   Last 3 Weights 03/14/2021 03/13/2021 03/12/2021  Weight (lbs) 214 lb 11.7 oz 211 lb 10.3 oz 209 lb 14.1 oz  Weight (kg) 97.4 kg 96 kg 95.2 kg      Telemetry    Aflutter/NSR  - Personally Reviewed  ECG    No new tracing - Personally Reviewed  Physical Exam   GEN:  No acute distress. Sitting up in a chair Neck: No JVD Cardiac: RRR, no murmurs, rubs, or gallops.  Respiratory: Clear to auscultation bilaterally. GI: Soft, nontender, non-distended  MS: No edema; No deformity. Warm Neuro:  Nonfocal  Psych: Normal affect   Labs    High Sensitivity Troponin:   Recent Labs  Lab 03/01/21 1343 03/01/21 1602  TROPONINIHS 11 11      Chemistry Recent Labs  Lab 03/08/21 0436 03/08/21 0758 03/12/21 0259 03/13/21 0027 03/14/21 0051  NA 134*   < > 134* 134* 136  K 4.3   < > 3.6 3.6 4.1  CL 101   < > 100 102 103  CO2 24   < > 30 27 26   GLUCOSE 152*   < > 148* 133* 118*  BUN 23   < > 25* 20 12  CREATININE 0.97   < > 0.76 0.70 0.63  CALCIUM 9.1   < > 8.0* 8.1* 8.3*  PROT 6.2*  --   --   --   --   ALBUMIN 3.3*  --   --   --   --  AST 22  --   --   --   --   ALT 21  --   --   --   --   ALKPHOS 38  --   --   --   --   BILITOT 0.9  --   --   --   --   GFRNONAA >60   < > >60 >60 >60  ANIONGAP 9   < > 4* 5 7   < > = values in this interval not displayed.     Hematology Recent Labs  Lab 03/10/21 1049 03/11/21 0403 03/12/21 0259  WBC 9.7 10.1 9.0  RBC 2.67* 2.68* 2.50*  HGB 8.1* 7.9* 7.4*  HCT 24.8* 25.1* 23.0*  MCV 92.9 93.7 92.0  MCH 30.3 29.5 29.6  MCHC 32.7 31.5 32.2  RDW 13.2 13.0 13.0  PLT 122* 128* 130*    BNPNo results for input(s): BNP, PROBNP in the last 168 hours.   DDimer No results for input(s): DDIMER in the last 168 hours.   Radiology    No results found.  Cardiac Studies   TEE 03/05/21: IMPRESSIONS  1. Left ventricular ejection fraction, by estimation, is 60 to 65%. The  left ventricle has normal function.  2. Right ventricular systolic function is normal. The right ventricular  size is mildly enlarged.  3. Left atrial size was severely dilated. No left atrial/left atrial  appendage thrombus was detected.  4. Right atrial size was mildly dilated.  5. The aortic valve is tricuspid. Aortic valve  regurgitation is not  visualized.  6. The mitral valve is abnormal. Flail P2. Severe mitral valve  regurgitation. MR severe by both 2D ERO and 3D VCA. Systolic flow reversal  in pulmonary veins, consistent with severe MR.  Cath 03/05/21:  The left ventricular systolic function is normal. The left ventricular ejection fraction is 55-65% by visual estimate.  LV end diastolic pressure is mildly elevated.  There is moderate (3+) mitral regurgitation.  Hemodynamic findings consistent with mild pulmonary hypertension.  Angiographically normal coronary arteries  SUMMARY  Angiographically Normal Coronary Arteries  Mild Pulmonary Hypertension with mean PAP 30 mmHg, PCWP 5 mmHg with LVEDP 15 mmHg.  Mild-moderately reduced Cardiac Output-Cardiac Index (Fick) 4.44-2.12.  RECOMMENDATIONS  Continue with evaluation for mitral valve surgery   Patient Profile     65 y.o. male with a hx of DM, HTN, HLD, tobacco use, chronic back pain, fibromyalgia, atrial flutterand newly diagnosed severe MR and acute diastolic heart failure who is now s/p MVR with Dr. Orvan Seen  Assessment & Plan    #Severe MR s/p MVR: Patient with newly diagnosed severe MR and diastolic HF in that setting. Attempted mitral valve repair, but ultimately required mitral valve replacement. Pre-op cath with non-obstructive CAD. -Continue ASA 81mg  daily -Continue lasix 40mg  PO daily -AC management per CV surgery -Plan to discharge home today; will arrange for Cardiology follow-up  #Afib/Flutter: CHADs-vasc 4 s/p MAZE procedure as above. Intermittently in Afib/flutter but rates controlled.  [facility] management per CV surgery -On amiodarone 200mg  TID; will taper as out-patient  #HTN: Improved but remains elevated -Increase losartan back to home dose of 100mg  daily -Will add back antihypertensives as needed as out-patient  #HLD: -Continue crestor 10mg  daily   Will arrange for Cardiology follow-up post-discharge.       For questions or updates, please contact [place] Please consult [email or web address] for contact info under        Signed, Freada Bergeron, MD  03/14/2021, 3:17  PM

## 2021-03-15 ENCOUNTER — Other Ambulatory Visit (HOSPITAL_COMMUNITY): Payer: Self-pay

## 2021-03-15 DIAGNOSIS — I5033 Acute on chronic diastolic (congestive) heart failure: Secondary | ICD-10-CM | POA: Diagnosis not present

## 2021-03-15 LAB — GLUCOSE, CAPILLARY: Glucose-Capillary: 142 mg/dL — ABNORMAL HIGH (ref 70–99)

## 2021-03-15 MED ORDER — AMIODARONE HCL 200 MG PO TABS
200.0000 mg | ORAL_TABLET | Freq: Three times a day (TID) | ORAL | 1 refills | Status: DC
Start: 2021-03-15 — End: 2021-04-15
  Filled 2021-03-15: qty 90, 30d supply, fill #0

## 2021-03-15 MED ORDER — FE FUMARATE-B12-VIT C-FA-IFC PO CAPS
1.0000 | ORAL_CAPSULE | Freq: Two times a day (BID) | ORAL | 1 refills | Status: DC
  Filled 2021-03-15: qty 60, 30d supply, fill #0

## 2021-03-15 MED ORDER — POTASSIUM CHLORIDE CRYS ER 20 MEQ PO TBCR
20.0000 meq | EXTENDED_RELEASE_TABLET | Freq: Every day | ORAL | 0 refills | Status: DC
  Filled 2021-03-15: qty 30, 30d supply, fill #0

## 2021-03-15 MED ORDER — COLCHICINE 0.6 MG PO TABS
0.3000 mg | ORAL_TABLET | Freq: Two times a day (BID) | ORAL | 0 refills | Status: DC
  Filled 2021-03-15: qty 30, 30d supply, fill #0

## 2021-03-15 MED ORDER — LOSARTAN POTASSIUM 50 MG PO TABS
50.0000 mg | ORAL_TABLET | Freq: Every day | ORAL | 1 refills | Status: DC
  Filled 2021-03-15: qty 30, 30d supply, fill #0

## 2021-03-15 MED ORDER — METFORMIN HCL 1000 MG PO TABS: 1000.0000 mg | ORAL_TABLET | Freq: Two times a day (BID) | ORAL | Status: DC

## 2021-03-15 MED ORDER — LOSARTAN POTASSIUM 50 MG PO TABS
100.0000 mg | ORAL_TABLET | Freq: Every day | ORAL | Status: DC
  Administered 2021-03-15: 100 mg via ORAL
  Filled 2021-03-15: qty 2

## 2021-03-15 MED ORDER — FUROSEMIDE 40 MG PO TABS
40.0000 mg | ORAL_TABLET | Freq: Every day | ORAL | 0 refills | Status: DC
  Filled 2021-03-15: qty 30, 30d supply, fill #0

## 2021-03-15 MED ORDER — APIXABAN 5 MG PO TABS
5.0000 mg | ORAL_TABLET | Freq: Two times a day (BID) | ORAL | 1 refills | Status: DC
  Filled 2021-03-15: qty 60, 30d supply, fill #0

## 2021-03-15 NOTE — TOC Initial Note (Signed)
Transition of Care [facility]) - Initial/Assessment Note    Patient Details  Name: Michael Valenzuela MRN: [id number] Date of Birth: 09/24/1956  Transition of Care [facility]) CM/SW Contact:    Curlene Labrum, RN Phone Number: 03/15/2021, 9:51 AM  Clinical Narrative:                 Case management met with the patient at the bedside and the patient plans to discharge home with his wife by car.  [employer] was called to provide a rollator to the room as ordered.  Follow up appointments are noted.  Medications will be delivered to the patient's room by [facility] pharmacy.  CM to follow the patient for discharged needs to home today.  Expected Discharge Plan: Home/Self Care Barriers to Discharge: No Barriers Identified   Patient Goals and CMS Choice Patient states their goals for this hospitalization and ongoing recovery are:: Home with wife today CMS Medicare.gov Compare Post Acute Care list provided to:: Patient Choice offered to / list presented to : Patient  Expected Discharge Plan and Services Expected Discharge Plan: Home/Self Care   Discharge Planning Services: CM Consult Post Acute Care Choice: Durable Medical Equipment   Expected Discharge Date: 03/15/21               DME Arranged: Gilford Rile rolling with seat DME Agency: [employer] Date DME Agency Contacted: 03/15/21 Time DME Agency Contacted: [phone] Representative spoke with at DME Agency: Sue Lush, Barrett with [employer]            Prior Living Arrangements/Services   Lives with:: Spouse Patient language and need for interpreter reviewed:: Yes        Need for Family Participation in Patient Care: Yes (Comment) Care giver support system in place?: Yes (comment)   Criminal Activity/Legal Involvement Pertinent to Current Situation/Hospitalization: No - Comment as needed  Activities of Daily Living Home Assistive Devices/Equipment: None ADL Screening (condition at time of admission) Patient's cognitive ability adequate to safely complete daily  activities?: Yes Is the patient deaf or have difficulty hearing?: No Does the patient have difficulty seeing, even when wearing glasses/contacts?: No Does the patient have difficulty concentrating, remembering, or making decisions?: No Patient able to express need for assistance with ADLs?: No Does the patient have difficulty dressing or bathing?: No Independently performs ADLs?: Yes (appropriate for developmental age) Does the patient have difficulty walking or climbing stairs?: No Weakness of Legs: Both Weakness of Arms/Hands: None  Permission Sought/Granted Permission sought to share information with : Case Manager Permission granted to share information with : Yes, Verbal Permission Granted     Permission granted to share info w AGENCY: [employer] for rollator  Permission granted to share info w Relationship: wife     Emotional Assessment Appearance:: Appears stated age   Affect (typically observed): Accepting Orientation: : Oriented to Self,Oriented to Place,Oriented to  Time,Oriented to Situation Alcohol / Substance Use: Not Applicable    Admission diagnosis:  Atrial fibrillation ([place]) [I48.91] Atrial fibrillation, unspecified type ([place]) [I48.91] S/P MVR (mitral valve replacement) [Z95.2] Patient Active Problem List   Diagnosis Date Noted  . Encounter for preoperative dental examination   . Severe mitral regurgitation   . Dental caries   . Chronic apical periodontitis   . Chronic periodontitis   . Retained dental root   . Accretions on teeth   . Phobia of dental procedure   . Defective dental restoration   . S/P MVR (mitral valve replacement) 03/08/2021  . Mitral valve insufficiency   .  Dyspnea and respiratory abnormalities   . 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 Acute on chronic heart failure with preserved ejection fraction (HFpEF)/in the setting of severe mitral valve prolapse  03/03/2021  . Pleural effusion 03/01/2021  . Atrial fibrillation ([place]) 03/01/2021  . Hx of tobacco use, presenting hazards to health 12/15/2019  . BMI 30.0-30.9,adult 06/21/2015  . Type 2 diabetes mellitus with other specified complication ([place]) 19/47/1252  . Essential hypertension, benign 01/13/2013  . Hyperlipidemia with target LDL less than 100 01/13/2013  . Fibromyalgia   . Chronic low back pain   . Vitamin D deficiency    PCP:  Chevis Pretty, FNP Pharmacy:   [facility], [place] [address] Phone: [phone] Fax: [phone]  [id number] [address] [place] [address] Phone: [phone] Fax: [phone]  [facility] [address] [place] [address] Phone: [phone] Fax: [phone]     Social Determinants of Health (SDOH) Interventions    Readmission Risk Interventions No flowsheet data found.

## 2021-03-15 NOTE — Plan of Care (Signed)
  Problem: Education: Goal: Knowledge of General Education information will improve Description: Including pain rating scale, medication(s)/side effects and non-pharmacologic comfort measures Outcome: Progressing   Problem: Health Behavior/Discharge Planning: Goal: Ability to manage health-related needs will improve Outcome: Progressing   Problem: Clinical Measurements: Goal: Ability to maintain clinical measurements within normal limits will improve Outcome: Progressing Goal: Will remain free from infection Outcome: Progressing Goal: Diagnostic test results will improve Outcome: Progressing Goal: Respiratory complications will improve Outcome: Progressing Goal: Cardiovascular complication will be avoided Outcome: Progressing   Problem: Activity: Goal: Risk for activity intolerance will decrease Outcome: Progressing   Problem: Nutrition: Goal: Adequate nutrition will be maintained Outcome: Progressing   Problem: Coping: Goal: Level of anxiety will decrease Outcome: Progressing   Problem: Elimination: Goal: Will not experience complications related to bowel motility Outcome: Progressing Goal: Will not experience complications related to urinary retention Outcome: Progressing   Problem: Pain Managment: Goal: General experience of comfort will improve Outcome: Progressing   Problem: Safety: Goal: Ability to remain free from injury will improve Outcome: Progressing   Problem: Skin Integrity: Goal: Risk for impaired skin integrity will decrease Outcome: Progressing   Problem: Education: Goal: Ability to demonstrate management of disease process will improve Outcome: Progressing Goal: Ability to verbalize understanding of medication therapies will improve Outcome: Progressing Goal: Individualized Educational Video(s) Outcome: Progressing   Problem: Activity: Goal: Capacity to carry out activities will improve Outcome: Progressing   Problem: Cardiac: Goal:  Ability to achieve and maintain adequate cardiopulmonary perfusion will improve Outcome: Progressing   Problem: Education: Goal: Knowledge of disease or condition will improve Outcome: Progressing Goal: Understanding of medication regimen will improve Outcome: Progressing Goal: Individualized Educational Video(s) Outcome: Progressing   Problem: Activity: Goal: Ability to tolerate increased activity will improve Outcome: Progressing   Problem: Cardiac: Goal: Ability to achieve and maintain adequate cardiopulmonary perfusion will improve Outcome: Progressing   Problem: Health Behavior/Discharge Planning: Goal: Ability to safely manage health-related needs after discharge will improve Outcome: Progressing   Problem: Education: Goal: Will demonstrate proper wound care and an understanding of methods to prevent future damage Outcome: Progressing Goal: Knowledge of disease or condition will improve Outcome: Progressing Goal: Knowledge of the prescribed therapeutic regimen will improve Outcome: Progressing Goal: Individualized Educational Video(s) Outcome: Progressing   Problem: Activity: Goal: Risk for activity intolerance will decrease Outcome: Progressing   Problem: Cardiac: Goal: Will achieve and/or maintain hemodynamic stability Outcome: Progressing   Problem: Clinical Measurements: Goal: Postoperative complications will be avoided or minimized Outcome: Progressing   Problem: Respiratory: Goal: Respiratory status will improve Outcome: Progressing   Problem: Skin Integrity: Goal: Wound healing without signs and symptoms of infection Outcome: Progressing Goal: Risk for impaired skin integrity will decrease Outcome: Progressing   Problem: Urinary Elimination: Goal: Ability to achieve and maintain adequate renal perfusion and functioning will improve Outcome: Progressing

## 2021-03-15 NOTE — Care Management Important Message (Signed)
Important Message  Patient Details  Name: Michael Valenzuela MRN: [id number] Date of Birth: February 19, 1956   Medicare Important Message Given:  Yes Patient left prior to IM delivery will mail to the patient home address.     Florina Glas 03/15/2021, 2:24 PM

## 2021-03-15 NOTE — Progress Notes (Signed)
[address]       [place] [address]             [phone]      7 Days Post-Op Procedure(s) (LRB): MITRAL VALVE REPAIR USING  PHYSIO RING SIZE 28 (EXPLANTED) THEN MITRAL VALVE REPLACEMENT (MVR) WITH MAGNA EASE MITRAL VALVE SIZE 25MM  ON PUMP, LEFT ATRIAL APPENDAGE CLIPPING USING A SIZE 50 ATRIAL CLIP (N/A) MAZE (N/A) TRANSESOPHAGEAL ECHOCARDIOGRAM (TEE) (N/A) Subjective: Feels good  Objective: Vital signs in last 24 hours: Temp:  [98.2 F (36.8 C)-99.5 F (37.5 C)] 98.8 F (37.1 C) (05/06 0342) Pulse Rate:  [60-74] 60 (05/06 0342) Cardiac Rhythm: Atrial flutter (05/05 1900) Resp:  [16-19] 17 (05/06 0342) BP: (139-151)/(72-93) 143/72 (05/06 0342) SpO2:  [92 %-97 %] 96 % (05/06 0342) Weight:  [91.3 kg] 91.3 kg (05/06 0626)  Hemodynamic parameters for last 24 hours:    Intake/Output from previous day: 05/05 0701 - 05/06 0700 In: 720 [P.O.:720] Out: 4575 [Urine:4575] Intake/Output this shift: No intake/output data recorded.  General appearance: alert, cooperative and no distress Heart: regular rate and rhythm Lungs: min dim in bases Abdomen: benign Extremities: + LE edema Wound: incis healing well  Lab Results: No results for input(s): WBC, HGB, HCT, PLT in the last 72 hours. BMET: Recent Labs    03/13/21 0027 03/14/21 0051  NA 134* 136  K 3.6 4.1  CL 102 103  CO2 27 26  GLUCOSE 133* 118*  BUN 20 12  CREATININE 0.70 0.63  CALCIUM 8.1* 8.3*    PT/INR:  Recent Labs    03/13/21 1025  LABPROT 16.5*  INR 1.3*   ABG    Component Value Date/Time   PHART 7.370 03/10/2021 0244   HCO3 20.8 03/10/2021 0244   TCO2 22 03/10/2021 0244   ACIDBASEDEF 4.0 (H) 03/10/2021 0244   O2SAT 69.4 03/11/2021 0403   CBG (last 3)  Recent Labs    03/14/21 1619 03/14/21 2106 03/15/21 0625  GLUCAP 135* 118* 142*    Meds Scheduled Meds: . amiodarone  200 mg Oral TID  . apixaban  5 mg Oral BID  . aspirin EC  81 mg Oral Daily  . bisacodyl   10 mg Oral Daily   Or  . bisacodyl  10 mg Rectal Daily  . Chlorhexidine Gluconate Cloth  6 each Topical Daily  . colchicine  0.3 mg Oral BID  . docusate sodium  200 mg Oral Daily  . ferrous [id number] C-folic acid  1 capsule Oral TID PC  . furosemide  40 mg Oral Daily  . guaiFENesin  1,200 mg Oral BID  . insulin aspart  0-24 Units Subcutaneous TID WC  . linagliptin  5 mg Oral Daily  . losartan  50 mg Oral Daily  . mouth rinse  15 mL Mouth Rinse BID  . metFORMIN  1,000 mg Oral BID WC  . pantoprazole  40 mg Oral Daily  . rosuvastatin  10 mg Oral Daily   Continuous Infusions: . lactated ringers     PRN Meds:.dextrose, guaiFENesin, lactated ringers, levalbuterol, menthol-cetylpyridinium, metoprolol tartrate, ondansetron (ZOFRAN) IV, oxyCODONE, sodium chloride flush, traMADol  Xrays No results found.  Assessment/Plan: S/P Procedure(s) (LRB): MITRAL VALVE REPAIR USING  PHYSIO RING SIZE 28 (EXPLANTED) THEN MITRAL VALVE REPLACEMENT (MVR) WITH MAGNA EASE MITRAL VALVE SIZE 25MM  ON PUMP, LEFT ATRIAL APPENDAGE CLIPPING USING A SIZE 50 ATRIAL CLIP (N/A) MAZE (N/A) TRANSESOPHAGEAL ECHOCARDIOGRAM (TEE) (N/A)  1 afeb, VSS sBP 130's-150's- losartan  added- meds to be titrated as outpatient 2 tele some aflutter with CVR- conts amio and apixaban- hopefully maze will assist with conversion over time 3 BS controlled on current RX 4 no new labs Stable for D/C    LOS: 14 days    John Giovanni PA-C Pager [phone] 03/15/2021

## 2021-03-15 NOTE — Progress Notes (Signed)
[id number] Pt ready to go home. Education completed with pt and wife who voiced understanding. Has rollator in room for home use. Gave walking instructions for ex, encouraged IS. Reviewed sternal precautions and staying in the tube, wound care and smoking cessation.  Tobacco cessation handout given. Discussed CRP 2 and will send referral letter to [facility] for follow up. Graylon Good RN BSN 03/15/2021 11:34 AM

## 2021-03-18 ENCOUNTER — Telehealth (HOSPITAL_BASED_OUTPATIENT_CLINIC_OR_DEPARTMENT_OTHER): Payer: Self-pay

## 2021-03-18 ENCOUNTER — Other Ambulatory Visit (HOSPITAL_BASED_OUTPATIENT_CLINIC_OR_DEPARTMENT_OTHER): Payer: Self-pay

## 2021-03-19 ENCOUNTER — Encounter: Payer: Medicare Other | Admitting: Cardiothoracic Surgery

## 2021-03-19 NOTE — Discharge Summary (Signed)
Physician Discharge Summary  Patient ID: Michael Valenzuela MRN: [id number] DOB/AGE: 1956-05-26 65 y.o.  Admit date: 03/01/2021 Discharge date: 03/18/2021  Admission Diagnoses: Congestive heart failure, acute on chronic with preserved ejection fraction in the setting of severe mitral prolapse  Discharge Diagnoses:  Principal Problem:   Acute on chronic heart failure with preserved ejection fraction (HFpEF)/in the setting of severe mitral valve prolapse Active Problems:   Fibromyalgia   Essential hypertension, benign   Hyperlipidemia with target LDL less than 100   Type 2 diabetes mellitus with other specified complication (HCC)   Pleural effusion   Atrial fibrillation (HCC)   Mitral valve posterior leaflet prolapse/-severe holosystolic prolapse of posterior leaflet of the mitral valve   Dyspnea and respiratory abnormalities   Mitral valve insufficiency   S/P MVR (mitral valve replacement)   Encounter for preoperative dental examination   Severe mitral regurgitation   Dental caries   Chronic apical periodontitis   Chronic periodontitis   Retained dental root   Accretions on teeth   Phobia of dental procedure   Defective dental restoration       Patient Active Problem List   Diagnosis Date Noted  . Encounter for preoperative dental examination   . Severe mitral regurgitation   . Dental caries   . Chronic apical periodontitis   . Chronic periodontitis   . Retained dental root   . Accretions on teeth   . Phobia of dental procedure   . Defective dental restoration   . S/P MVR (mitral valve replacement) 03/08/2021  . Mitral valve insufficiency   . Dyspnea and respiratory abnormalities   . 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 Acute on chronic heart failure with preserved ejection fraction (HFpEF)/in the setting of severe mitral valve prolapse 03/03/2021  . Pleural  effusion 03/01/2021  . Atrial fibrillation ([place]) 03/01/2021  . Hx of tobacco use, presenting hazards to health 12/15/2019  . BMI 30.0-30.9,adult 06/21/2015  . Type 2 diabetes mellitus with other specified complication ([place]) 72/53/6644  . Essential hypertension, benign 01/13/2013  . Hyperlipidemia with target LDL less than 100 01/13/2013  . Fibromyalgia   . Chronic low back pain   . Vitamin D deficiency      Referring:No ref. provider found Primary Care:Martin, Mary-Margaret, FNP Primary Cardiologist:No primary care provider on file- cathed by Glenetta Hew MD    HPI:   65 year old man with a history of fibromyalgia but otherwise in good health presented last week with relatively acute shortness of breath. He presented to his primary care with the symptoms where he was diagnosed with new onset A. fib and treated with Lasix after discovery of hypoxia.Objective evaluation demonstrated new systolic murmur and an echocardiogram demonstrating eccentric mitral regurgitation. Transferred to [facility] and underwent left heart catheterization demonstrating clean coronaries. Consultation is obtained for consideration of mitral valve surgery and atrial ablation. Patient states that last night he experienced worse shortness of breath and required more diuretic. This morning he is comfortable.  Hospital Course:   On 4/29, [name] underwent a mitral valve replacement, MAZE and atrial appendage clip with Dr. Orvan Seen. He tolerated the procedure well and was transferred to the surgical ICU. POD 1 we continue to wean his vasoactive drips. We set his pacemaker to a back-up rate. We planned to extubated later that day. He was extubated that night and POD 2 he was on milrinone 0.125. We kept his chest tubes in  place due to output. We started a diuretic regimen for fluid overload. His pain was well controlled. We planned to slowly start coumadin for valve thrombus prophylaxis.  He has required an  aggressive diuresis for volume overload.  He has responded well to this.  Renal function has remained within normal limits clinical volume overload but has shown steady improvement.  It was determined that he should be loaded with amiodarone  as he had preoperative atrial fibrillation, and this was started with oral dosing.  I have monitored daily with his Coumadin adjusted over time.  Epicardial pacing wires were removed on postop day #4.  He continues with aggressive pulmonary toilet.  Blood sugars have been under good control and he will be resumed on his preoperative diabetic medication regimen.  He continues to have some intermittent episodes of rate controlled atrial fibrillation but is mostly in sinus rhythm.  He continues his amiodarone and has been changed to Eliquis from Coumadin.  His incisions continue to look good without evidence of infection.  He is tolerating routine cardiac rehab modalities using standard protocols.  At the time of discharge his oxygen was weaned.  He is maintaining good saturations on room air.  At the time of discharge the patient is felt to be quite stable.   Discharged Condition: good  Consults: cardiology  Significant Diagnostic Studies: Routine postoperative serial labs and chest x-ray  Treatments: surgery:  Procedure(s): MITRAL VALVE REPAIR USING PHYSIO RING SIZE 28 (EXPLANTED) THEN MITRAL VALVE REPLACEMENT (MVR) WITH MAGNA EASE MITRAL VALVE SIZE 25MM ON PUMP, LEFT ATRIAL APPENDAGE CLIPPING USING A SIZE 50 ATRIAL CLIP MAZE TRANSESOPHAGEAL ECHOCARDIOGRAM (TEE) Procedure Note  Ac Colan male 65 y.o. 03/08/21 Procedure(s) and Anesthesia Type: * MITRAL VALVE REPAIR USING PHYSIO RING SIZE 28 (EXPLANTED) THEN MITRAL VALVE REPLACEMENT (MVR) WITH MAGNA EASE MITRAL VALVE SIZE 25MM ON PUMP, LEFT ATRIAL APPENDAGE CLIPPING USING A SIZE 50 ATRIAL CLIP - General * MAZE - General * TRANSESOPHAGEAL ECHOCARDIOGRAM (TEE) - General  Surgeon(s) and  Role: Wonda Olds, MD - Primary    Discharge Exam: Blood pressure 136/89, pulse 72, temperature 99.3 F (37.4 C), temperature source Oral, resp. rate 16, height 6' (1.829 m), weight 91.3 kg, SpO2 96 %.  General appearance:alert, cooperative and no distress Heart:regular rate and rhythm Lungs:min dim in bases Abdomen:benign Extremities:+ LE edema Wound:incis healing well Disposition: Discharge disposition: 01-Home or Self Care           Discharge Instructions    Amb Referral to Cardiac Rehabilitation   Complete by: As directed    Referring to [facility] CRP 2   Diagnosis: Valve Replacement   Valve: Mitral   After initial evaluation and assessments completed: Virtual Based Care may be provided alone or in conjunction with Phase 2 Cardiac Rehab based on patient barriers.: Yes     Allergies as of 03/15/2021   No Known Allergies        Medication List    STOP taking these medications   amLODipine 10 MG tablet Commonly known as: NORVASC   felodipine 10 MG 24 hr tablet Commonly known as: PLENDIL   fenofibrate 160 MG tablet   hydrochlorothiazide 25 MG tablet Commonly known as: HYDRODIURIL     TAKE these medications   amiodarone 200 MG tablet Commonly known as: PACERONE Take 1 tablet (200 mg total) by mouth 3 (three) times daily.   aspirin EC 81 MG tablet Take 1 tablet (81 mg total) by mouth daily.   colchicine 0.6 MG  tablet Take 1/2 tablet (0.3 mg total) by mouth 2 (two) times daily.   Eliquis 5 MG Tabs tablet Generic drug: apixaban Take 1 tablet (5 mg total) by mouth 2 (two) times daily.   Ferocon capsule Generic drug: ferrous [id number] C-folic acid Take 1 capsule by mouth 2 (two) times daily after a meal.   furosemide 40 MG tablet Commonly known as: LASIX Take 1 tablet (40 mg total) by mouth daily.   losartan 100 MG tablet Commonly known as: COZAAR TAKE 1 TABLET(100 MG) BY MOUTH DAILY    metFORMIN 1000 MG tablet Commonly known as: GLUCOPHAGE Take 1 tablet (1,000 mg total) by mouth 2 (two) times daily with a meal. TAKE 1 TABLET(1000 MG) BY MOUTH TWICE DAILY WITH A MEAL   oxyCODONE 15 MG immediate release tablet Commonly known as: ROXICODONE Take 15 mg by mouth every 6 (six) hours as needed for pain.   potassium chloride SA 20 MEQ tablet Commonly known as: KLOR-CON Take 1 tablet (20 mEq total) by mouth daily.   rosuvastatin 10 MG tablet Commonly known as: CRESTOR Take 1 tablet (10 mg total) by mouth daily.   sitaGLIPtin 100 MG tablet Commonly known as: Januvia TAKE 1 TABLET(100 MG) BY MOUTH DAILY What changed:   how much to take  how to take this  when to take this          Follow-up Information    Atkins, Glenice Bow, MD Follow up.   Specialty: Cardiothoracic Surgery Why: Please see discharge paperwork for follow-up appointment with surgeon. Contact information: [address] [place] [address] [phone]        Leonie Man, MD Follow up.   Specialty: Cardiology Why: Please see discharge paperwork for follow-up appointment with cardiology. Contact information: [address] [place] [address] [place], [place] [facility] Follow up.   Why: [employer] will be providing a rolling walker to the hospital room. Contact information: [address] [place] [address] [phone]               Signed: John Giovanni PA-C 03/18/2021, 11:02 AM                                       Note Details  Ludwig Lean, [place] File Time 03/18/2021 11:07 AM  Author Type Physician Assistant Status Signed  Last Editor Odis Luster [facility] [place] # [phone] Admit Date 03/01/2021

## 2021-03-20 ENCOUNTER — Encounter: Payer: Medicare Other | Admitting: Cardiothoracic Surgery

## 2021-03-22 ENCOUNTER — Telehealth (HOSPITAL_COMMUNITY): Payer: Self-pay

## 2021-03-22 NOTE — Telephone Encounter (Signed)
Per phase I cardiac rehab, fax pt cardiac referral to [facility] cardiac rehab.

## 2021-03-24 ENCOUNTER — Other Ambulatory Visit: Payer: Self-pay | Admitting: Nurse Practitioner

## 2021-03-25 ENCOUNTER — Other Ambulatory Visit (HOSPITAL_BASED_OUTPATIENT_CLINIC_OR_DEPARTMENT_OTHER): Payer: Self-pay

## 2021-03-25 NOTE — Progress Notes (Deleted)
Cardiology Office Note:    Date:  03/25/2021   ID:  [name], DOB 11-17-1955, MRN [id number]  PCP:  Chevis Pretty, FNP  Cardiologist:  Candee Furbish, MD  Electrophysiologist:  None   Referring MD: Chevis Pretty, *   Chief Complaint: hospital follow-up after MVR  History of Present Illness:    Jaydn Moscato is a 65 y.o. male with a history of normal coronaries on cardiac catheterization in 02/2021, severe mitral valve prolapse with severe mitral regurgitation s/p bioprosthetic mitral valve replacement/MAZE/atrial appendage clip on 03/08/2021, paroxysmal atrial fibrillation/flutter s/p MAZE procedure and ahypertension, hyperlipidemia, type 2 diabetes mellitus,  Fibromyalgia, and chronic back pain who is followed by Dr. Marlou Porch and presents today for hospital follow-up after recent mitral valve replacement.  Patient was recently admitted from 03/01/2021 to 03/18/2021 with symptoms consistent with acute diastolic CHF and was found to be in new onset atrial fibrillation and found to have severe mitral valve prolapse with severe MR.Patient was diuresed with IV Lasix. Right/left cardiac catheterization showed normal coronaries and hemodynamic findings consistent with mild pulmonary hypertension. Patient ultimately underwent mitral valve replacement (mitral valve repair was attempted but was unsuccessful) on 03/08/2021. Also underwent MAZE procedure and left atrial appendage clip at the same time. He required Milrinone post-op and further diuresis.  He continued to have intermittent atrial fibrillation/flutter and was started on Amiodarone. He was initially started on Coumadin after surgery but this was changed to Eliquis prior to discharge. He was discharged on Aspirin 81mg  daily, Eliquis 5mg  twice daily, Amiodarone 200mg  three times daily, Lasix 40mg  daily, Losartan 181m daily, and Crestor 10mg  daily. Home Amlodipine, Felodipine, and HCTZ were stopped at discharge.  Patient was seen by Dr. Orvan Seen  for follow-up on 03/28/2021 at which time ***.  Patient presents today for follow-up. ***  Severe Mitral Valve Prolapse with Severe Mitral Regurgitation S/p Bioprosthetic Mitral Valve Replacement - Patient recently admitted with acute diastolic secondary to severe mitral valve prolapse with severe mitral regurgitation. He underwent bioprosthetic mitral valve replacement with Dr. Orvan Seen on 03/08/2021.  - Recovering well. - Post-op Echo scheduled for 04/12/2021.  Chronic Diastolic CHF - Echo during recent admission showed LVEF of 70-75% with normal wall motion.  - Appears euvolemic on exam.  - Continue Lasix 40mg  daily.  - Discussed importance of daily weights and sodium/fluid restrictions. ***  Paroxysmal Atrial Fibrillation/Flutter - *** - Continue Amiodarone *** - Continue chronic anticoagulation with Eliquis 5mg  twice daily.   Hypertension - *** - Continue Losartan 100mg  daily.   Hyperlipidemia - Lipid panel from 12/2020: Total Cholesterol 153, Triglycerides 259, HDL 442, LDL 70.  - Continue Crestor 10mg  daily. - Followed by PCP.  Type 2 Diabetes Mellitus - On Metformin and Januvia.  - Management per PCP.  Past Medical History:  Diagnosis Date  . Chronic low back pain   . Diabetes mellitus without complication ([place])   . Fibromyalgia   . Hyperlipidemia   . Hypertension   . Vitamin D deficiency     Past Surgical History:  Procedure Laterality Date  . HERNIA REPAIR    . MAZE N/A 03/08/2021   Procedure: MAZE;  Surgeon: Wonda Olds, MD;  Location: [facility];  Service: Open Heart Surgery;  Laterality: N/A;  . MITRAL VALVE REPAIR N/A 03/08/2021   Procedure: MITRAL VALVE REPAIR USING  PHYSIO RING SIZE 28 (EXPLANTED) THEN MITRAL VALVE REPLACEMENT (MVR) WITH MAGNA EASE MITRAL VALVE SIZE 25MM  ON PUMP, LEFT ATRIAL APPENDAGE CLIPPING USING A SIZE 50 ATRIAL CLIP;  Surgeon: Wonda Olds, MD;  Location: [facility];  Service: Open Heart Surgery;  Laterality: N/A;  . RIGHT/LEFT HEART  CATH AND CORONARY ANGIOGRAPHY N/A 03/05/2021   Procedure: RIGHT/LEFT HEART CATH AND CORONARY ANGIOGRAPHY;  Surgeon: Leonie Man, MD;  Location: [facility];  Service: Cardiovascular;  Laterality: N/A;  . TEE WITHOUT CARDIOVERSION N/A 03/05/2021   Procedure: TRANSESOPHAGEAL ECHOCARDIOGRAM (TEE);  Surgeon: Donato Heinz, MD;  Location: [facility];  Service: Cardiovascular;  Laterality: N/A;  . TEE WITHOUT CARDIOVERSION N/A 03/08/2021   Procedure: TRANSESOPHAGEAL ECHOCARDIOGRAM (TEE);  Surgeon: Wonda Olds, MD;  Location: [facility];  Service: Open Heart Surgery;  Laterality: N/A;    Current Medications: No outpatient medications have been marked as taking for the 04/02/21 encounter (Appointment) with Darreld Mclean, PA-C.     Allergies:   Patient has no known allergies.   Social History   Socioeconomic History  . Marital status: Married    Spouse name: [name]  . Number of children: 2  . Years of education: 24  . Highest education level: Some college, no degree  Occupational History  . Occupation: Retired    Fish farm manager: [employer]  . Occupation: disabled  Tobacco Use  . Smoking status: Former Smoker    Packs/day: 0.50    Years: 20.00    Pack years: 10.00    Types: Cigarettes    Quit date: 02/09/2019    Years since quitting: 2.1  . Smokeless tobacco: Never Used  Vaping Use  . Vaping Use: Never used  Substance and Sexual Activity  . Alcohol use: Yes    Comment: occasional  . Drug use: No  . Sexual activity: Not Currently  Other Topics Concern  . Not on file  Social History Narrative   Patient has fibromyalgia and has significant pain with that. He has two adult children and is married. He has one grandchild.    Social Determinants of Health   Financial Resource Strain: Low Risk   . Difficulty of Paying Living Expenses: Not hard at all  Food Insecurity: No Food Insecurity  . Worried About Charity fundraiser in the Last Year: Never true  . Ran  Out of Food in the Last Year: Never true  Transportation Needs: No Transportation Needs  . Lack of Transportation (Medical): No  . Lack of Transportation (Non-Medical): No  Physical Activity: Sufficiently Active  . Days of Exercise per Week: 7 days  . Minutes of Exercise per Session: 30 min  Stress: No Stress Concern Present  . Feeling of Stress : Not at all  Social Connections: Moderately Isolated  . Frequency of Communication with Friends and Family: Once a week  . Frequency of Social Gatherings with Friends and Family: More than three times a week  . Attends Religious Services: Never  . Active Member of Clubs or Organizations: No  . Attends Archivist Meetings: Never  . Marital Status: Married     Family History: The patient's family history includes Cancer in his mother; Diabetes in his mother and paternal uncle; Diabetes (age of onset: 54) in his son; Heart attack in his brother; Heart disease in his father and mother; Hypertension in his son.  ROS:   Please see the history of present illness.     EKGs/Labs/Other Studies Reviewed:    The following studies were reviewed today:  TTE 03/02/2021: Impressions: 1. Probable flail posterior MV leaflet; MR is eccentric and difficult to  quantitate; at least moderate and  likely severe; suggest TEE to further  assess.  2. Left ventricular ejection fraction, by estimation, is 70 to 75%. The  left ventricle has hyperdynamic function. The left ventricle has no  regional wall motion abnormalities. The left ventricular internal cavity  size was mildly dilated. Left  ventricular diastolic function could not be evaluated.  3. Right ventricular systolic function is normal. The right ventricular  size is normal. Tricuspid regurgitation signal is inadequate for assessing  PA pressure.  4. Left atrial size was severely dilated.  5. The mitral valve is normal in structure. No evidence of mitral valve  regurgitation. No evidence  of mitral stenosis. There is severe  holosystolic prolapse of posterior leaflet of the mitral valve.  6. The aortic valve is tricuspid. Aortic valve regurgitation is not  visualized. Mild aortic valve sclerosis is present, with no evidence of  aortic valve stenosis.  7. Aortic dilatation noted. There is borderline dilatation of the  ascending aorta, measuring 38 mm.  8. The inferior vena cava is dilated in size with >50% respiratory  variability, suggesting right atrial pressure of 8 mmHg.  _______________  TEE 03/05/2021: Impressions: 1. Left ventricular ejection fraction, by estimation, is 60 to 65%. The  left ventricle has normal function.  2. Right ventricular systolic function is normal. The right ventricular  size is mildly enlarged.  3. Left atrial size was severely dilated. No left atrial/left atrial  appendage thrombus was detected.  4. Right atrial size was mildly dilated.  5. The aortic valve is tricuspid. Aortic valve regurgitation is not  visualized.  6. The mitral valve is abnormal. Flail P2. Severe mitral valve  regurgitation. MR severe by both 2D ERO and 3D VCA. Systolic flow reversal  in pulmonary veins, consistent with severe MR. _______________  Right/Left Cardiac Catheterization 03/05/2021:  The left ventricular systolic function is normal. The left ventricular ejection fraction is 55-65% by visual estimate.  LV end diastolic pressure is mildly elevated.  There is moderate (3+) mitral regurgitation.  Hemodynamic findings consistent with mild pulmonary hypertension.  Angiographically normal coronary arteries   Summary:  Angiographically Normal Coronary Arteries  Mild Pulmonary Hypertension with mean PAP 30 mmHg, PCWP 5 mmHg with LVEDP 15 mmHg.  Mild-moderately reduced Cardiac Output-Cardiac Index (Fick) 4.44-2.12.  Recommendations:  Continue with evaluation for mitral valve surgery  EKG:  EKG ordered today. EKG personally reviewed and  demonstrates ***  Recent Labs: 03/01/2021: B Natriuretic Peptide 301.0 03/02/2021: TSH 4.498 03/08/2021: ALT 21 03/09/2021: Magnesium 2.6 03/12/2021: Hemoglobin 7.4; Platelets 130 03/14/2021: BUN 12; Creatinine, Ser 0.63; Potassium 4.1; Sodium 136  Recent Lipid Panel    Component Value Date/Time   CHOL 153 12/28/2020 1030   CHOL 142 01/28/2013 1216   TRIG 259 (H) 12/28/2020 1030   TRIG 242 (H) 03/19/2015 1020   TRIG 299 (H) 01/28/2013 1216   HDL 41 12/28/2020 1030   HDL 34 (L) 03/19/2015 1020   HDL 35 (L) 01/28/2013 1216   CHOLHDL 3.7 12/28/2020 1030   LDLCALC 70 12/28/2020 1030   LDLCALC 63 06/09/2014 0959   LDLCALC 47 01/28/2013 1216    Physical Exam:    Vital Signs: There were no vitals taken for this visit.    Wt Readings from Last 3 Encounters:  03/15/21 201 lb 4.5 oz (91.3 kg)  12/28/20 196 lb 6 oz (89.1 kg)  09/27/20 198 lb (89.8 kg)     General: 65 y.o. male in no acute distress. HEENT: Normocephalic and atraumatic. Sclera clear. EOMs intact. Neck:  Supple. No carotid bruits. No JVD. Heart: *** RRR. Distinct S1 and S2. No murmurs, gallops, or rubs. Radial and distal pedal pulses 2+ and equal bilaterally. Lungs: No increased work of breathing. Clear to ausculation bilaterally. No wheezes, rhonchi, or rales.  Abdomen: Soft, non-distended, and non-tender to palpation. Bowel sounds present in all 4 quadrants.  MSK: Normal strength and tone for age. *** Extremities: No lower extremity edema.    Skin: Warm and dry. Neuro: Alert and oriented x3. No focal deficits. Psych: Normal affect. Responds appropriately.   Assessment:    No diagnosis found.  Plan:     Disposition: Follow up in ***   Medication Adjustments/Labs and Tests Ordered: Current medicines are reviewed at length with the patient today.  Concerns regarding medicines are outlined above.  No orders of the defined types were placed in this encounter.  No orders of the defined types were placed in this  encounter.   There are no Patient Instructions on file for this visit.   Signed, Darreld Mclean, PA-C  03/25/2021 8:52 PM    [facility]

## 2021-03-27 ENCOUNTER — Telehealth (HOSPITAL_COMMUNITY): Payer: Self-pay

## 2021-03-27 ENCOUNTER — Other Ambulatory Visit: Payer: Self-pay | Admitting: Cardiothoracic Surgery

## 2021-03-27 ENCOUNTER — Ambulatory Visit: Payer: Self-pay | Admitting: Nurse Practitioner

## 2021-03-27 ENCOUNTER — Other Ambulatory Visit (HOSPITAL_COMMUNITY): Payer: Self-pay

## 2021-03-27 DIAGNOSIS — Z952 Presence of prosthetic heart valve: Secondary | ICD-10-CM

## 2021-03-27 NOTE — Telephone Encounter (Signed)
Pharmacy Transitions of Care Follow-up Telephone Call  Date of discharge: 03/15/21 Discharge Diagnosis: Afib  How have you been since you were released from the hospital? Patient has been doing well. Is back down to his previous weight and back into his normal routine. Hasn't been taking lasix for the last 2 days because he could no longer urinate but his swelling has gone down. Has visit with cardiologist tomorrow.  Medication changes made at discharge: START taking:  amiodarone (PACERONE)   colchicine   Eliquis   Ferocon   furosemide (LASIX) - hasn't been taking regularly. Emptied him to the point he couldn't urinate. Hasn't taken for 2 days.  potassium chloride SA (KLOR-CON)    STOP taking:  amLODipine 10 MG tablet (NORVASC)   felodipine 10 MG 24 hr tablet (PLENDIL)   fenofibrate 160 MG tablet   hydrochlorothiazide 25 MG tablet (HYDRODIURIL)   Medication changes verified by the patient? Yes    Medication Accessibility:  Home Pharmacy: [facility] [place]  Was the patient provided with refills on discharged medications? yes  Have all prescriptions been transferred from [facility] to home pharmacy? yes  Is the patient able to afford medications? yes   Medication Review:   APIXABAN (ELIQUIS)  Apixaban 5mg  BID on 03/15/21 - Discussed importance of taking medication around the same time everyday  - Reviewed potential DDIs with patient  - Advised patient of medications to avoid (NSAIDs, ASA)  - Educated that Tylenol (acetaminophen) will be the preferred analgesic to prevent risk of bleeding  - Emphasized importance of monitoring for signs and symptoms of bleeding (abnormal bruising, prolonged bleeding, nose bleeds, bleeding from gums, discolored urine, black tarry stools)  - Advised patient to alert all providers of anticoagulation therapy prior to starting a new medication or having a procedure    Follow-up Appointments:  PCP Hospital f/u appt confirmed? Yes on  04/18/21  Specialist Hospital f/u appt confirmed? Follow up with Dr. Orvan Seen in Cardiology 03/27/21  If their condition worsens, is the pt aware to call PCP or go to the Emergency Dept.? yes  Final Patient Assessment: Patient doing well. Has refills transferred and follow ups scheduled.

## 2021-03-28 ENCOUNTER — Encounter: Payer: Self-pay | Admitting: Cardiothoracic Surgery

## 2021-03-28 ENCOUNTER — Other Ambulatory Visit: Payer: Self-pay

## 2021-03-28 ENCOUNTER — Ambulatory Visit
Admission: RE | Admit: 2021-03-28 | Discharge: 2021-03-28 | Disposition: A | Discharge status: Home / Self Care | Payer: Medicare Other | Source: Ambulatory Visit | Attending: Cardiothoracic Surgery | Admitting: Cardiothoracic Surgery

## 2021-03-28 ENCOUNTER — Ambulatory Visit (INDEPENDENT_AMBULATORY_CARE_PROVIDER_SITE_OTHER): Payer: Self-pay | Admitting: Cardiothoracic Surgery

## 2021-03-28 VITALS — BP 148/84 | HR 79 | Resp 20 | Ht 72.0 in | Wt 183.6 lb

## 2021-03-28 DIAGNOSIS — Z952 Presence of prosthetic heart valve: Secondary | ICD-10-CM

## 2021-03-28 NOTE — Progress Notes (Signed)
[address]       [place] [address]             [phone]     CARDIOTHORACIC SURGERY OFFICE NOTE  Referring Provider is Chevis Pretty, * Primary Cardiologist is Candee Furbish, MD PCP is Chevis Pretty, FNP   HPI:  65 year old man underwent mitral valve replacement and Maze procedure a little over 3 weeks ago.  He has done well since surgery.  He presents for his first postoperative visit.  He states that most of the pain he experiences now relates to his underlying fibromyalgia.  Denies any arrhythmia symptoms.  He stopped taking Lasix recently   Current Outpatient Medications  Medication Sig Dispense Refill  . amiodarone (PACERONE) 200 MG tablet Take 1 tablet (200 mg total) by mouth 3 (three) times daily. 90 tablet 1  . apixaban (ELIQUIS) 5 MG TABS tablet Take 1 tablet (5 mg total) by mouth 2 (two) times daily. 60 tablet 1  . aspirin EC 81 MG tablet Take 1 tablet (81 mg total) by mouth daily.    Marland Kitchen losartan (COZAAR) 100 MG tablet TAKE 1 TABLET(100 MG) BY MOUTH DAILY 90 tablet 1  . metFORMIN (GLUCOPHAGE) 1000 MG tablet Take 1 tablet (1,000 mg total) by mouth 2 (two) times daily with a meal. TAKE 1 TABLET(1000 MG) BY MOUTH TWICE DAILY WITH A MEAL    . oxyCODONE (ROXICODONE) 15 MG immediate release tablet Take 15 mg by mouth every 6 (six) hours as needed for pain.    . rosuvastatin (CRESTOR) 10 MG tablet Take 1 tablet (10 mg total) by mouth daily. 90 tablet 1  . sitaGLIPtin (JANUVIA) 100 MG tablet TAKE 1 TABLET(100 MG) BY MOUTH DAILY (Patient taking differently: Take 100 mg by mouth daily. TAKE 1 TABLET(100 MG) BY MOUTH DAILY) 90 tablet 1   No current facility-administered medications for this visit.      Physical Exam:   BP (!) 148/84 (BP Location: Left Arm, Patient Position: Sitting, Cuff Size: Normal)   Pulse 79   Resp 20   Ht 6' (1.829 m)   Wt 83.3 kg   SpO2 94% Comment: RA  BMI 24.90 kg/m   General:  Well-appearing no acute  distress  Chest:   Clear to auscultation except at right base  CV:   Regular rate and rhythm  Incisions:  Well-healed  Abdomen:  Soft nontender  Extremities:  No edema  Diagnostic Tests:  Chest x-ray demonstrates a small right pleural effusion that is likely loculated   Impression:  Doing well after mitral valve replacement and Maze procedure  Plan:  Follow-up as needed Refer to [facility] cardiology in [place] to drive Discontinue Lasix and potassium, colchicine and iron supplement Wound to stop amiodarone and Eliquis in 3 months assuming normal sinus rhythm  I spent in excess of 20 minutes during the conduct of this office consultation and >50% of this time involved direct face-to-face encounter with the patient for counseling and/or coordination of their care.  Level 2                 10 minutes Level 3                 15 minutes Level 4                 25 minutes Level 5                 40  minutes  B.  Murvin Natal, MD 03/28/2021 9:47 AM

## 2021-04-02 ENCOUNTER — Ambulatory Visit: Payer: Medicare Other | Admitting: Student

## 2021-04-12 ENCOUNTER — Other Ambulatory Visit: Payer: Self-pay

## 2021-04-12 ENCOUNTER — Ambulatory Visit (HOSPITAL_COMMUNITY): Payer: Medicare Other | Attending: Cardiology

## 2021-04-12 DIAGNOSIS — I059 Rheumatic mitral valve disease, unspecified: Secondary | ICD-10-CM

## 2021-04-12 LAB — ECHOCARDIOGRAM COMPLETE
Area-P 1/2: 1.51 cm2
MV VTI: 1.14 cm2
P 1/2 time: 168.6 msec
S' Lateral: 4 cm

## 2021-04-15 ENCOUNTER — Other Ambulatory Visit: Payer: Self-pay

## 2021-04-15 ENCOUNTER — Ambulatory Visit (INDEPENDENT_AMBULATORY_CARE_PROVIDER_SITE_OTHER): Payer: Medicare Other | Admitting: Nurse Practitioner

## 2021-04-15 ENCOUNTER — Encounter: Payer: Self-pay | Admitting: Nurse Practitioner

## 2021-04-15 VITALS — BP 177/92 | HR 74 | Temp 96.9°F | Resp 20 | Ht 72.0 in | Wt 179.0 lb

## 2021-04-15 DIAGNOSIS — E559 Vitamin D deficiency, unspecified: Secondary | ICD-10-CM

## 2021-04-15 DIAGNOSIS — Z6830 Body mass index (BMI) 30.0-30.9, adult: Secondary | ICD-10-CM

## 2021-04-15 DIAGNOSIS — E1169 Type 2 diabetes mellitus with other specified complication: Secondary | ICD-10-CM

## 2021-04-15 DIAGNOSIS — Z952 Presence of prosthetic heart valve: Secondary | ICD-10-CM

## 2021-04-15 DIAGNOSIS — E785 Hyperlipidemia, unspecified: Secondary | ICD-10-CM

## 2021-04-15 DIAGNOSIS — I051 Rheumatic mitral insufficiency: Secondary | ICD-10-CM | POA: Diagnosis not present

## 2021-04-15 DIAGNOSIS — I10 Essential (primary) hypertension: Secondary | ICD-10-CM | POA: Diagnosis not present

## 2021-04-15 DIAGNOSIS — G8929 Other chronic pain: Secondary | ICD-10-CM

## 2021-04-15 DIAGNOSIS — I480 Paroxysmal atrial fibrillation: Secondary | ICD-10-CM

## 2021-04-15 DIAGNOSIS — M5440 Lumbago with sciatica, unspecified side: Secondary | ICD-10-CM

## 2021-04-15 DIAGNOSIS — M797 Fibromyalgia: Secondary | ICD-10-CM

## 2021-04-15 LAB — BAYER DCA HB A1C WAIVED: HB A1C (BAYER DCA - WAIVED): 6 % (ref <7.0)

## 2021-04-15 MED ORDER — APIXABAN 5 MG PO TABS: 5.0000 mg | ORAL_TABLET | Freq: Two times a day (BID) | ORAL | 1 refills | Status: DC

## 2021-04-15 MED ORDER — AMIODARONE HCL 200 MG PO TABS: 200.0000 mg | ORAL_TABLET | Freq: Three times a day (TID) | ORAL | 1 refills | Status: DC

## 2021-04-15 MED ORDER — LOSARTAN POTASSIUM 100 MG PO TABS: ORAL_TABLET | ORAL | 1 refills | Status: DC

## 2021-04-15 MED ORDER — METFORMIN HCL 1000 MG PO TABS: 1000.0000 mg | ORAL_TABLET | Freq: Two times a day (BID) | ORAL | 1 refills | Status: DC

## 2021-04-15 MED ORDER — SITAGLIPTIN PHOSPHATE 100 MG PO TABS: ORAL_TABLET | ORAL | 1 refills | Status: DC

## 2021-04-15 MED ORDER — ROSUVASTATIN CALCIUM 10 MG PO TABS: 10.0000 mg | ORAL_TABLET | Freq: Every day | ORAL | 1 refills | Status: DC

## 2021-04-15 NOTE — Patient Instructions (Signed)

## 2021-04-15 NOTE — Progress Notes (Signed)
Subjective:    Patient ID: Michael Valenzuela, male    DOB: [birth year], 65 y.o.   MRN: [id number]   Chief Complaint: medical management of chronic issues     HPI:  1. Essential hypertension, benign No c/o chest pain, sob or headache. Does not check blood pressure at home. BP Readings from Last 3 Encounters:  03/28/21 (!) 148/84  03/15/21 136/89  12/28/20 122/75     2. Paroxysmal atrial fibrillation (HCC) Is currently on eliquis without side effects. He denies palpitations or heart racing.  3. Rheumatic mitral regurgitation Had chest pain back in April and had to go to the hospital. Had to have valve replacement. He is doing well. Strength is gradually returning.  4. Type 2 diabetes mellitus with other specified complication, without long-term current use of insulin (HCC) Fasting blood sugars 130-150. No lows. Lab Results  Component Value Date   HGBA1C 6.7 (H) 03/06/2021     5. Chronic midline low back pain with sciatica, sciatica laterality unspecified Sees pain management  6. Fibromyalgia Sees pain management  7. Hyperlipidemia with target LDL less than 100 Has been trying to watch diet since his valve replacement. Not exercising very much. Lab Results  Component Value Date   CHOL 153 12/28/2020   HDL 41 12/28/2020   LDLCALC 70 12/28/2020   TRIG 259 (H) 12/28/2020   CHOLHDL 3.7 12/28/2020    8. S/P MVR (mitral valve replacement) Discharge from hospital 03/15/21, after mitral valve replacement. He was in severe heart failure from mitral valve prolapse.  9. Vitamin D deficiency Is on daily vitamin d supplement  10. BMI 30.0-30.9,adult Weight is down 4lbs since last visit. Wt Readings from Last 3 Encounters:  04/15/21 179 lb (81.2 kg)  03/28/21 183 lb 9.6 oz (83.3 kg)  03/15/21 201 lb 4.5 oz (91.3 kg)   BMI Readings from Last 3 Encounters:  04/15/21 24.28 kg/m  03/28/21 24.90 kg/m  03/15/21 27.30 kg/m       Outpatient Encounter Medications as of  04/15/2021  Medication Sig  . amiodarone (PACERONE) 200 MG tablet Take 1 tablet (200 mg total) by mouth 3 (three) times daily.  Marland Kitchen apixaban (ELIQUIS) 5 MG TABS tablet Take 1 tablet (5 mg total) by mouth 2 (two) times daily.  Marland Kitchen aspirin EC 81 MG tablet Take 1 tablet (81 mg total) by mouth daily.  Marland Kitchen losartan (COZAAR) 100 MG tablet TAKE 1 TABLET(100 MG) BY MOUTH DAILY  . metFORMIN (GLUCOPHAGE) 1000 MG tablet Take 1 tablet (1,000 mg total) by mouth 2 (two) times daily with a meal. TAKE 1 TABLET(1000 MG) BY MOUTH TWICE DAILY WITH A MEAL  . oxyCODONE (ROXICODONE) 15 MG immediate release tablet Take 15 mg by mouth every 6 (six) hours as needed for pain.  . rosuvastatin (CRESTOR) 10 MG tablet Take 1 tablet (10 mg total) by mouth daily.  . sitaGLIPtin (JANUVIA) 100 MG tablet TAKE 1 TABLET(100 MG) BY MOUTH DAILY (Patient taking differently: Take 100 mg by mouth daily. TAKE 1 TABLET(100 MG) BY MOUTH DAILY)   No facility-administered encounter medications on file as of 04/15/2021.    Past Surgical History:  Procedure Laterality Date  . HERNIA REPAIR    . MAZE N/A 03/08/2021   Procedure: MAZE;  Surgeon: Wonda Olds, MD;  Location: [place];  Service: Open Heart Surgery;  Laterality: N/A;  . MITRAL VALVE REPAIR N/A 03/08/2021   Procedure: MITRAL VALVE REPAIR USING  PHYSIO RING SIZE 28 (EXPLANTED) THEN MITRAL VALVE REPLACEMENT (MVR) WITH  MAGNA EASE MITRAL VALVE SIZE 25MM  ON PUMP, LEFT ATRIAL APPENDAGE CLIPPING USING A SIZE 50 ATRIAL CLIP;  Surgeon: Wonda Olds, MD;  Location: [place];  Service: Open Heart Surgery;  Laterality: N/A;  . RIGHT/LEFT HEART CATH AND CORONARY ANGIOGRAPHY N/A 03/05/2021   Procedure: RIGHT/LEFT HEART CATH AND CORONARY ANGIOGRAPHY;  Surgeon: Leonie Man, MD;  Location: [facility];  Service: Cardiovascular;  Laterality: N/A;  . TEE WITHOUT CARDIOVERSION N/A 03/05/2021   Procedure: TRANSESOPHAGEAL ECHOCARDIOGRAM (TEE);  Surgeon: Donato Heinz, MD;  Location: [place];  Service: Cardiovascular;  Laterality: N/A;  . TEE WITHOUT CARDIOVERSION N/A 03/08/2021   Procedure: TRANSESOPHAGEAL ECHOCARDIOGRAM (TEE);  Surgeon: Wonda Olds, MD;  Location: [facility];  Service: Open Heart Surgery;  Laterality: N/A;    Family History  Problem Relation Age of Onset  . Heart disease Mother   . Diabetes Mother   . Cancer Mother   . Heart disease Father   . Diabetes Son 15       type 1  . Diabetes Paternal Uncle   . Heart attack Brother   . Hypertension Son     New complaints: None today  Social history: Lives with wife  Controlled substance contract: n/a    Review of Systems  Constitutional: Negative for diaphoresis.  Eyes: Negative for pain.  Respiratory: Negative for shortness of breath.   Cardiovascular: Negative for chest pain, palpitations and leg swelling.  Gastrointestinal: Negative for abdominal pain.  Endocrine: Negative for polydipsia.  Skin: Negative for rash.  Neurological: Negative for dizziness, weakness and headaches.  Hematological: Does not bruise/bleed easily.  All other systems reviewed and are negative.      Objective:   Physical Exam Vitals and nursing note reviewed.  Constitutional:      Appearance: Normal appearance. He is well-developed.  HENT:     Head: Normocephalic.     Nose: Nose normal.  Eyes:     Pupils: Pupils are equal, round, and reactive to light.  Neck:     Thyroid: No thyroid mass or thyromegaly.     Vascular: No carotid bruit or JVD.     Trachea: Phonation normal.  Cardiovascular:     Rate and Rhythm: Normal rate and regular rhythm.  Pulmonary:     Effort: Pulmonary effort is normal. No respiratory distress.     Breath sounds: Normal breath sounds.  Abdominal:     General: Bowel sounds are normal.     Palpations: Abdomen is soft.     Tenderness: There is no abdominal tenderness.  Musculoskeletal:        General: Normal range of motion.     Cervical back: Normal range of motion and neck  supple.  Lymphadenopathy:     Cervical: No cervical adenopathy.  Skin:    General: Skin is warm and dry.  Neurological:     Mental Status: He is alert and oriented to person, place, and time.  Psychiatric:        Behavior: Behavior normal.        Thought Content: Thought content normal.        Judgment: Judgment normal.     BP (!) 177/92   Pulse 74   Temp (!) 96.9 F (36.1 C) (Temporal)   Resp 20   Ht 6' (1.829 m)   Wt 179 lb (81.2 kg)   SpO2 97%   BMI 24.28 kg/m   HGBA1c 6.3%       Assessment &  Plan:  Barnell Shieh comes in today with chief complaint of Medical Management of Chronic Issues   Diagnosis and orders addressed:  1. Essential hypertension, benign Low sodium diet Keep diary of blood pressure at home - CBC with Differential/Platelet - CMP14+EGFR - losartan (COZAAR) 100 MG tablet; TAKE 1 TABLET(100 MG) BY MOUTH DAILY  Dispense: 90 tablet; Refill: 1  2. Paroxysmal atrial fibrillation (HCC) Avoid caffeine - apixaban (ELIQUIS) 5 MG TABS tablet; Take 1 tablet (5 mg total) by mouth 2 (two) times daily.  Dispense: 60 tablet; Refill: 1 - amiodarone (PACERONE) 200 MG tablet; Take 1 tablet (200 mg total) by mouth 3 (three) times daily.  Dispense: 90 tablet; Refill: 1  3. Rheumatic mitral regurgitation  4. Type 2 diabetes mellitus with other specified complication, without long-term current use of insulin (HCC) Continue low carb diet - Bayer DCA Hb A1c Waived - metFORMIN (GLUCOPHAGE) 1000 MG tablet; Take 1 tablet (1,000 mg total) by mouth 2 (two) times daily with a meal. TAKE 1 TABLET(1000 MG) BY MOUTH TWICE DAILY WITH A MEAL  Dispense: 180 tablet; Refill: 1 - sitaGLIPtin (JANUVIA) 100 MG tablet; TAKE 1 TABLET(100 MG) BY MOUTH DAILY  Dispense: 90 tablet; Refill: 1  5. Chronic midline low back pain with sciatica, sciatica laterality unspecified Keep follow up with cardiology  6. Fibromyalgia  7. Hyperlipidemia with target LDL less than 100 Low fat diet -  Lipid panel - rosuvastatin (CRESTOR) 10 MG tablet; Take 1 tablet (10 mg total) by mouth daily.  Dispense: 90 tablet; Refill: 1  8. S/P MVR (mitral valve replacement) Keep follow up with cardiology Hospital records reviewed  9. Vitamin D deficiency Continue daily vitamin d supplement  10. BMI 30.0-30.9,adult Discussed diet and exercise for person with BMI >25 Will recheck weight in 3-6 months   Labs pending Health Maintenance reviewed Diet and exercise encouraged  Follow up plan: 1 month   Roy, FNP

## 2021-04-16 LAB — CMP14+EGFR
ALT: 25 IU/L (ref 0–44)
AST: 29 IU/L (ref 0–40)
Albumin/Globulin Ratio: 1.5 (ref 1.2–2.2)
Albumin: 4.3 g/dL (ref 3.8–4.8)
Alkaline Phosphatase: 87 IU/L (ref 44–121)
BUN/Creatinine Ratio: 20 (ref 10–24)
BUN: 15 mg/dL (ref 8–27)
Bilirubin Total: 0.3 mg/dL (ref 0.0–1.2)
CO2: 21 mmol/L (ref 20–29)
Calcium: 10 mg/dL (ref 8.6–10.2)
Chloride: 100 mmol/L (ref 96–106)
Creatinine, Ser: 0.76 mg/dL (ref 0.76–1.27)
Globulin, Total: 2.9 g/dL (ref 1.5–4.5)
Glucose: 81 mg/dL (ref 65–99)
Potassium: 4.7 mmol/L (ref 3.5–5.2)
Sodium: 137 mmol/L (ref 134–144)
Total Protein: 7.2 g/dL (ref 6.0–8.5)
eGFR: 100 mL/min/{1.73_m2} (ref >59)

## 2021-04-16 LAB — LIPID PANEL
Chol/HDL Ratio: 2.3 ratio (ref 0.0–5.0)
Cholesterol, Total: 101 mg/dL (ref 100–199)
HDL: 43 mg/dL (ref >39)
LDL Chol Calc (NIH): 35 mg/dL (ref 0–99)
Triglycerides: 135 mg/dL (ref 0–149)
VLDL Cholesterol Cal: 23 mg/dL (ref 5–40)

## 2021-04-16 LAB — CBC WITH DIFFERENTIAL/PLATELET
Basophils Absolute: 0.1 10*3/uL (ref 0.0–0.2)
Basos: 1 %
EOS (ABSOLUTE): 0.3 10*3/uL (ref 0.0–0.4)
Eos: 4 %
Hematocrit: 35.2 % — ABNORMAL LOW (ref 37.5–51.0)
Hemoglobin: 11 g/dL — ABNORMAL LOW (ref 13.0–17.7)
Immature Grans (Abs): 0 10*3/uL (ref 0.0–0.1)
Immature Granulocytes: 0 %
Lymphocytes Absolute: 1.2 10*3/uL (ref 0.7–3.1)
Lymphs: 16 %
MCH: 25.7 pg — ABNORMAL LOW (ref 26.6–33.0)
MCHC: 31.3 g/dL — ABNORMAL LOW (ref 31.5–35.7)
MCV: 82 fL (ref 79–97)
Monocytes Absolute: 0.7 10*3/uL (ref 0.1–0.9)
Monocytes: 8 %
Neutrophils Absolute: 5.6 10*3/uL (ref 1.4–7.0)
Neutrophils: 71 %
Platelets: 393 10*3/uL (ref 150–450)
RBC: 4.28 x10E6/uL (ref 4.14–5.80)
RDW: 14.6 % (ref 11.6–15.4)
WBC: 7.9 10*3/uL (ref 3.4–10.8)

## 2021-04-18 ENCOUNTER — Other Ambulatory Visit: Payer: Self-pay | Admitting: Nurse Practitioner

## 2021-04-18 DIAGNOSIS — I480 Paroxysmal atrial fibrillation: Secondary | ICD-10-CM

## 2021-04-19 ENCOUNTER — Telehealth: Payer: Self-pay | Admitting: Nurse Practitioner

## 2021-04-19 MED ORDER — GLUCOSE BLOOD VI STRP: ORAL_STRIP | 3 refills | Status: DC

## 2021-04-19 NOTE — Telephone Encounter (Signed)
  Prescription Request  04/19/2021  What is the name of the medication or equipment? Glucose test strips  Have you contacted your pharmacy to request a refill? (if applicable) yes  Which pharmacy would you like this sent to? [facility] in [place]   Patient notified that their request is being sent to the clinical staff for review and that they should receive a response within 2 business days.

## 2021-04-19 NOTE — Telephone Encounter (Signed)
Pt aware refill sent to pharmacy 

## 2021-05-06 ENCOUNTER — Ambulatory Visit (INDEPENDENT_AMBULATORY_CARE_PROVIDER_SITE_OTHER): Payer: Medicare Other | Admitting: Cardiology

## 2021-05-06 ENCOUNTER — Other Ambulatory Visit: Payer: Self-pay

## 2021-05-06 ENCOUNTER — Encounter: Payer: Self-pay | Admitting: Cardiology

## 2021-05-06 VITALS — BP 152/88 | HR 67 | Ht 71.0 in | Wt 183.0 lb

## 2021-05-06 DIAGNOSIS — I059 Rheumatic mitral valve disease, unspecified: Secondary | ICD-10-CM | POA: Diagnosis not present

## 2021-05-06 DIAGNOSIS — I4891 Unspecified atrial fibrillation: Secondary | ICD-10-CM

## 2021-05-06 DIAGNOSIS — I480 Paroxysmal atrial fibrillation: Secondary | ICD-10-CM | POA: Diagnosis not present

## 2021-05-06 DIAGNOSIS — I10 Essential (primary) hypertension: Secondary | ICD-10-CM

## 2021-05-06 MED ORDER — AMLODIPINE BESYLATE 5 MG PO TABS: 5.0000 mg | ORAL_TABLET | Freq: Every day | ORAL | 6 refills | Status: DC

## 2021-05-06 MED ORDER — AMIODARONE HCL 200 MG PO TABS: 200.0000 mg | ORAL_TABLET | Freq: Every day | ORAL | 3 refills | Status: DC

## 2021-05-06 NOTE — Addendum Note (Signed)
Addended by: Levonne Hubert on: 05/06/2021 01:13 PM   Modules accepted: Orders

## 2021-05-06 NOTE — Progress Notes (Signed)
Clinical Summary Mr. Lovelady is a 65 y.o.male seen today for follow up of the following medical problems.     Mitral regurgitation - recent admission with diagnosis of severe MR 02/2021 - 03/08/2021 MVR with magna ease 51mm tissue valve along with LAA clipping and MAZE procedure - 04/2021 echo MVR mean gradient 10 mmHg.   - no recent SOB/DOE. Some left sided soreness at incision site  2. Afib/aflutter - newly diagnosed during 02/2021 admission - s/p LAA clipping and MAZE procedure during his MVR surgery - has been on amio. LAVI is 44  - taking amiodarone 200mg  tid since discharge - no recent palpitations   3. HTN - home bp's 140s-170s  4. Hyperlipidemia     Past Medical History:  Diagnosis Date   Chronic low back pain    Diabetes mellitus without complication (HCC)    Fibromyalgia    Hyperlipidemia    Hypertension    Vitamin D deficiency      No Known Allergies   Current Outpatient Medications  Medication Sig Dispense Refill   amiodarone (PACERONE) 200 MG tablet Take 1 tablet (200 mg total) by mouth 3 (three) times daily. 90 tablet 1   apixaban (ELIQUIS) 5 MG TABS tablet Take 1 tablet (5 mg total) by mouth 2 (two) times daily. 60 tablet 1   aspirin EC 81 MG tablet Take 1 tablet (81 mg total) by mouth daily.     glucose blood test strip TrueTrack strips. Test BS qd. Dx E11.9 100 each 3   losartan (COZAAR) 100 MG tablet TAKE 1 TABLET(100 MG) BY MOUTH DAILY 90 tablet 1   metFORMIN (GLUCOPHAGE) 1000 MG tablet Take 1 tablet (1,000 mg total) by mouth 2 (two) times daily with a meal. TAKE 1 TABLET(1000 MG) BY MOUTH TWICE DAILY WITH A MEAL 180 tablet 1   oxyCODONE (ROXICODONE) 15 MG immediate release tablet Take 15 mg by mouth every 6 (six) hours as needed for pain.     rosuvastatin (CRESTOR) 10 MG tablet Take 1 tablet (10 mg total) by mouth daily. 90 tablet 1   sitaGLIPtin (JANUVIA) 100 MG tablet TAKE 1 TABLET(100 MG) BY MOUTH DAILY 90 tablet 1   No current  facility-administered medications for this visit.     Past Surgical History:  Procedure Laterality Date   HERNIA REPAIR     MAZE N/A 03/08/2021   Procedure: MAZE;  Surgeon: Wonda Olds, MD;  Location: [place];  Service: Open Heart Surgery;  Laterality: N/A;   MITRAL VALVE REPAIR N/A 03/08/2021   Procedure: MITRAL VALVE REPAIR USING  PHYSIO RING SIZE 28 (EXPLANTED) THEN MITRAL VALVE REPLACEMENT (MVR) WITH MAGNA EASE MITRAL VALVE SIZE 25MM  ON PUMP, LEFT ATRIAL APPENDAGE CLIPPING USING A SIZE 50 ATRIAL CLIP;  Surgeon: Wonda Olds, MD;  Location: [place];  Service: Open Heart Surgery;  Laterality: N/A;   RIGHT/LEFT HEART CATH AND CORONARY ANGIOGRAPHY N/A 03/05/2021   Procedure: RIGHT/LEFT HEART CATH AND CORONARY ANGIOGRAPHY;  Surgeon: Leonie Man, MD;  Location: [facility];  Service: Cardiovascular;  Laterality: N/A;   TEE WITHOUT CARDIOVERSION N/A 03/05/2021   Procedure: TRANSESOPHAGEAL ECHOCARDIOGRAM (TEE);  Surgeon: Donato Heinz, MD;  Location: [place] [facility];  Service: Cardiovascular;  Laterality: N/A;   TEE WITHOUT CARDIOVERSION N/A 03/08/2021   Procedure: TRANSESOPHAGEAL ECHOCARDIOGRAM (TEE);  Surgeon: Wonda Olds, MD;  Location: [place];  Service: Open Heart Surgery;  Laterality: N/A;     No Known Allergies  Family History  Problem Relation Age of Onset   Heart disease Mother    Diabetes Mother    Cancer Mother    Heart disease Father    Diabetes Son 63       type 1   Diabetes Paternal Uncle    Heart attack Brother    Hypertension Son      Social History Mr. Laker reports that he quit smoking about 2 years ago. His smoking use included cigarettes. He has a 10.00 pack-year smoking history. He has never used smokeless tobacco. Mr. Wadlow reports current alcohol use.   Review of Systems CONSTITUTIONAL: No weight loss, fever, chills, weakness or fatigue.  HEENT: Eyes: No visual loss, blurred vision, double vision or yellow sclerae.No hearing  loss, sneezing, congestion, runny nose or sore throat.  SKIN: No rash or itching.  CARDIOVASCULAR: per hpi RESPIRATORY: No shortness of breath, cough or sputum.  GASTROINTESTINAL: No anorexia, nausea, vomiting or diarrhea. No abdominal pain or blood.  GENITOURINARY: No burning on urination, no polyuria NEUROLOGICAL: No headache, dizziness, syncope, paralysis, ataxia, numbness or tingling in the extremities. No change in bowel or bladder control.  MUSCULOSKELETAL: No muscle, back pain, joint pain or stiffness.  LYMPHATICS: No enlarged nodes. No history of splenectomy.  PSYCHIATRIC: No history of depression or anxiety.  ENDOCRINOLOGIC: No reports of sweating, cold or heat intolerance. No polyuria or polydipsia.  Marland Kitchen   Physical Examination Today's Vitals   05/06/21 1029  BP: (!) 152/88  Pulse: 67  SpO2: 98%  Weight: 183 lb (83 kg)  Height: 5\' 11"  (1.803 m)   Body mass index is 25.52 kg/m.  Gen: resting comfortably, no acute distress HEENT: no scleral icterus, pupils equal round and reactive, no palptable cervical adenopathy,  CV: irreg, no m/r/g, no jvd Resp: Clear to auscultation bilaterally GI: abdomen is soft, non-tender, non-distended, normal bowel sounds, no hepatosplenomegaly MSK: extremities are warm, no edema.  Skin: warm, no rash Neuro:  no focal deficits Psych: appropriate affect   Diagnostic Studies TEE 03/05/21: IMPRESSIONS   1. Left ventricular ejection fraction, by estimation, is 60 to 65%. The  left ventricle has normal function.   2. Right ventricular systolic function is normal. The right ventricular  size is mildly enlarged.   3. Left atrial size was severely dilated. No left atrial/left atrial  appendage thrombus was detected.   4. Right atrial size was mildly dilated.   5. The aortic valve is tricuspid. Aortic valve regurgitation is not  visualized.   6. The mitral valve is abnormal. Flail P2. Severe mitral valve  regurgitation. MR severe by both 2D ERO  and 3D VCA. Systolic flow reversal  in pulmonary veins, consistent with severe MR.   Cath 03/05/21: The left ventricular systolic function is normal. The left ventricular ejection fraction is 55-65% by visual estimate. LV end diastolic pressure is mildly elevated. There is moderate (3+) mitral regurgitation. Hemodynamic findings consistent with mild pulmonary hypertension. Angiographically normal coronary arteries   SUMMARY Angiographically Normal Coronary Arteries Mild Pulmonary Hypertension with mean PAP 30 mmHg, PCWP 5 mmHg with LVEDP 15 mmHg. Mild-moderately reduced Cardiac Output-Cardiac Index (Fick) 4.44-2.12.     Assessment and Plan  Severe MR s/p recent MVR - elevated mean gradient across MVR by recent TTE, will plan for TEE for closer evaluation for any valvular dysfunction. - no recent symptoms  2. Afib/aflutter - remains in afib today by ekg  - has been on amio since hospital discharge. Of note new diagnosis of afib  prior to his MVR surgery during the admission - lower amio to 200mg  daily, will try DCCV after TEE. If fails amio would then just focus on rate control. Had MAZE during recent surgery   3. HTN - elevated, had come off some of his prior bp meds after MVR surgery - restart norvasc 5mg  daily, he will call with bp log in 1 week  Plan for TEE to evaluate MVR and elevated gradients, DCCV to follow for persistent afib    Shared Decision Making/Informed Consent The risks [stroke, cardiac arrhythmias rarely resulting in the need for a temporary or permanent pacemaker, skin irritation or burns, esophageal damage, perforation (1:10,000 risk), bleeding, pharyngeal hematoma as well as other potential complications associated with conscious sedation including aspiration, arrhythmia, respiratory failure and death], benefits (treatment guidance, restoration of normal sinus rhythm, diagnostic support) and alternatives of a transesophageal echocardiogram guided cardioversion  were discussed in detail with Mr. Melroy and he is willing to proceed.     Arnoldo Lenis, M.D.

## 2021-05-06 NOTE — Patient Instructions (Addendum)
Medication Instructions:  Decrease Amiodarone to 200mg  daily.  Begin Norvasc 5mg  daily.  Continue all other medications.    Labwork: none  Testing/Procedures: Your physician has requested that you have a TEE/Cardioversion. During a TEE, sound waves are used to create images of your heart. It provides your doctor with information about the size and shape of your heart and how well your heart's chambers and valves are working. In this test, a transducer is attached to the end of a flexible tube that is guided down you throat and into your esophagus (the tube leading from your mouth to your stomach) to get a more detailed image of your heart. Once the TEE has determined that a blood clot is not present, the cardioversion begins. Electrical Cardioversion uses a jolt of electricity to your heart either through paddles or wired patches attached to your chest. This is a controlled, usually prescheduled, procedure. This procedure is done at the hospital and you are not awake during the procedure. You usually go home the day of the procedure. Please see the instruction sheet given to you today for more information.  Office will call with date & time.   Follow-Up: 2 months   Any Other Special Instructions Will Be Listed Below (If Applicable). Please call the office in 1 week with update on blood pressure readings.   If you need a refill on your cardiac medications before your next appointment, please call your pharmacy.

## 2021-05-09 ENCOUNTER — Encounter: Payer: Self-pay | Admitting: *Deleted

## 2021-05-09 ENCOUNTER — Other Ambulatory Visit: Payer: Self-pay | Admitting: Cardiology

## 2021-05-09 ENCOUNTER — Telehealth: Payer: Self-pay | Admitting: *Deleted

## 2021-05-09 DIAGNOSIS — I4891 Unspecified atrial fibrillation: Secondary | ICD-10-CM

## 2021-05-09 NOTE — Telephone Encounter (Signed)
TEE / DCCV scheduled at [facility] on Friday, May 17, 2021 at 12:30 - he will need to arrive at the main entrance at 11:00 am on day of procedure.    Pre-op day scheduled at [facility] on Wednesday, May 15, 2021 at 2:15 - main entrance as well.   Instructions given to be NPO after midnight except sips of water with medications.    He will hold his Metformin & Januvia morning of test only.  Patient verbalized understanding.

## 2021-05-14 ENCOUNTER — Telehealth: Payer: Self-pay | Admitting: Cardiology

## 2021-05-14 NOTE — Telephone Encounter (Signed)
BP readings    161/92  152/88 149/81 142/90 136/86 136/89 127/75 130/81 135/85

## 2021-05-14 NOTE — Telephone Encounter (Signed)
Patient has restarted amlodipine 5 mg and is sending Dr.Branch his BP recordings

## 2021-05-14 NOTE — Patient Instructions (Signed)
Michael Valenzuela  05/14/2021     @PREFPERIOPPHARMACY @   Your procedure is scheduled on  05/17/2021.   Report to [facility] at 1100  A.M.   Call this number if you have problems the morning of surgery:  [phone]   Remember:  Do not eat or drink after midnight.     Take these medicines the morning of surgery with A SIP OF WATER   amiodarone, amlodipine, oxycodone (if needed).      Do not wear jewelry, make-up or nail polish.  Do not wear lotions, powders, or perfumes, or deodorant.  Do not shave 48 hours prior to surgery.  Men may shave face and neck.  Do not bring valuables to the hospital.  [facility] is not responsible for any belongings or valuables.  Contacts, dentures or bridgework may not be worn into surgery.  Leave your suitcase in the car.  After surgery it may be brought to your room.  For patients admitted to the hospital, discharge time will be determined by your treatment team.  Patients discharged the day of surgery will not be allowed to drive home and must have someone with them for 24 hours.    Special instructions:   DO NOT smoke tobacco or vape for 24 hours before your procedure.  Please read over the following fact sheets that you were given. Anesthesia Post-op Instructions and Care and Recovery After Surgery      Electrical Cardioversion Electrical cardioversion is the delivery of a jolt of electricity to restore a normal rhythm to the heart. A rhythm that is too fast or is not regular keeps the heart from pumping well. In this procedure, sticky patches or metal paddlesare placed on the chest to deliver electricity to the heart from a device. This procedure may be done in an emergency if: There is low or no blood pressure as a result of the heart rhythm. Normal rhythm must be restored as fast as possible to protect the brain and heart from further damage. It may save a life. This may also be a scheduled procedure for irregular or fast  heart rhythms thatare not immediately life-threatening. Tell a health care provider about: Any allergies you have. All medicines you are taking, including vitamins, herbs, eye drops, creams, and over-the-counter medicines. Any problems you or family members have had with anesthetic medicines. Any blood disorders you have. Any surgeries you have had. Any medical conditions you have. Whether you are pregnant or may be pregnant. What are the risks? Generally, this is a safe procedure. However, problems may occur, including: Allergic reactions to medicines. A blood clot that breaks free and travels to other parts of your body. The possible return of an abnormal heart rhythm within hours or days after the procedure. Your heart stopping (cardiac arrest). This is rare. What happens before the procedure? Medicines Your health care provider may have you start taking: Blood-thinning medicines (anticoagulants) so your blood does not clot as easily. Medicines to help stabilize your heart rate and rhythm. Ask your health care provider about: Changing or stopping your regular medicines. This is especially important if you are taking diabetes medicines or blood thinners. Taking medicines such as aspirin and ibuprofen. These medicines can thin your blood. Do not take these medicines unless your health care provider tells you to take them. Taking over-the-counter medicines, vitamins, herbs, and supplements. General instructions Follow instructions from your health care provider about eating or drinking restrictions. Plan  to have someone take you home from the hospital or clinic. If you will be going home right after the procedure, plan to have someone with you for 24 hours. Ask your health care provider what steps will be taken to help prevent infection. These may include washing your skin with a germ-killing soap. What happens during the procedure?  An IV will be inserted into one of your  veins. Sticky patches (electrodes) or metal paddles may be placed on your chest. You will be given a medicine to help you relax (sedative). An electrical shock will be delivered. The procedure may vary among health care providers and hospitals. What can I expect after the procedure? Your blood pressure, heart rate, breathing rate, and blood oxygen level will be monitored until you leave the hospital or clinic. Your heart rhythm will be watched to make sure it does not change. You may have some redness on the skin where the shocks were given. Follow these instructions at home: Do not drive for 24 hours if you were given a sedative during your procedure. Take over-the-counter and prescription medicines only as told by your health care provider. Ask your health care provider how to check your pulse. Check it often. Rest for 48 hours after the procedure or as told by your health care provider. Avoid or limit your caffeine use as told by your health care provider. Keep all follow-up visits as told by your health care provider. This is important. Contact a health care provider if: You feel like your heart is beating too quickly or your pulse is not regular. You have a serious muscle cramp that does not go away. Get help right away if: You have discomfort in your chest. You are dizzy or you feel faint. You have trouble breathing or you are short of breath. Your speech is slurred. You have trouble moving an arm or leg on one side of your body. Your fingers or toes turn cold or blue. Summary Electrical cardioversion is the delivery of a jolt of electricity to restore a normal rhythm to the heart. This procedure may be done right away in an emergency or may be a scheduled procedure if the condition is not an emergency. Generally, this is a safe procedure. After the procedure, check your pulse often as told by your health care provider. This information is not intended to replace advice given to  you by your health care provider. Make sure you discuss any questions you have with your healthcare provider. Document Revised: 05/30/2019 Document Reviewed: 05/30/2019 Elsevier Patient Education  [place]. Transesophageal Echocardiogram Transesophageal echocardiogram (TEE) is a test that uses sound waves to take pictures of your heart. TEE is done by passing a small probe attached to a flexible tube down the part of the body that moves food from your mouth to your stomach (esophagus). The pictures give clear images of your heart. This can help your doctor seeif there are problems with your heart. Tell a doctor about: Any allergies you have. All medicines you are taking. This includes vitamins, herbs, eye drops, creams, and over-the-counter medicines. Any problems you or family members have had with anesthetic medicines. Any blood disorders you have. Any surgeries you have had. Any medical conditions you have. Any swallowing problems. Whether you have or have had a blockage in the part of the body that moves food from your mouth to your stomach. Whether you are pregnant or may be pregnant. What are the risks? In general, this is  a safe procedure. But, problems may occur, such as: Damage to nearby structures or organs. A tear in the part of the body that moves food from your mouth to your stomach. Irregular heartbeat. Hoarse voice or trouble swallowing. Bleeding. What happens before the procedure? Medicines Ask your doctor about changing or stopping: Your normal medicines. Vitamins, herbs, and supplements. Over-the-counter medicines. Do not take aspirin or ibuprofen unless you are told to. General instructions Follow instructions from your doctor about what you cannot eat or drink. You will take out any dentures or dental retainers. Plan to have a responsible adult take you home from the hospital or clinic. Plan to have a responsible adult care for you for the time you are  told after you leave the hospital or clinic. This is important. What happens during the procedure?  An IV will be put into one of your veins. You may be given: A sedative. This medicine helps you relax. A medicine to numb the back of your throat. This may be sprayed or gargled. Your blood pressure, heart rate, and breathing will be watched. You may be asked to lie on your left side. A bite block will be placed in your mouth. This keeps you from biting the tube. The tip of the probe will be placed into the back of your mouth. You will be asked to swallow. Your doctor will take pictures of your heart. The probe and bite block will be taken out after the test is done. The procedure may vary among doctors and hospitals. What can I expect after the procedure? You will be monitored until you leave the hospital or clinic. This includes checking your blood pressure, heart rate, breathing rate, and blood oxygen level. Your throat may feel sore and numb. This will get better over time. You will not be allowed to eat or drink until the numbness has gone away. It is common to have a sore throat for a day or two. It is up to you to get the results of your procedure. Ask how to get your results when they are ready. Follow these instructions at home: If you were given a sedative during your procedure, do not drive or use machines until your doctor says that it is safe. Return to your normal activities when your doctor says that it is safe. Keep all follow-up visits. Summary TEE is a test that uses sound waves to take pictures of your heart. You will be given a medicine to help you relax. Do not drive or use machines until your doctor says that it is safe. This information is not intended to replace advice given to you by your health care provider. Make sure you discuss any questions you have with your healthcare provider. Document Revised: 06/19/2020 Document Reviewed: 06/19/2020 Elsevier Patient  Education  2022 [place] After This sheet gives you information about how to care for yourself after your procedure. Your health care provider may also give you more specific instructions. If you have problems or questions, contact your health careprovider. What can I expect after the procedure? After the procedure, it is common to have: Tiredness. Forgetfulness about what happened after the procedure. Impaired judgment for important decisions. Nausea or vomiting. Some difficulty with balance. Follow these instructions at home: For the time period you were told by your health care provider:     Rest as needed. Do not participate in activities where you could fall or become injured. Do not drive or  use machinery. Do not drink alcohol. Do not take sleeping pills or medicines that cause drowsiness. Do not make important decisions or sign legal documents. Do not take care of children on your own. Eating and drinking Follow the diet that is recommended by your health care provider. Drink enough fluid to keep your urine pale yellow. If you vomit: Drink water, juice, or soup when you can drink without vomiting. Make sure you have little or no nausea before eating solid foods. General instructions Have a responsible adult stay with you for the time you are told. It is important to have someone help care for you until you are awake and alert. Take over-the-counter and prescription medicines only as told by your health care provider. If you have sleep apnea, surgery and certain medicines can increase your risk for breathing problems. Follow instructions from your health care provider about wearing your sleep device: Anytime you are sleeping, including during daytime naps. While taking prescription pain medicines, sleeping medicines, or medicines that make you drowsy. Avoid smoking. Keep all follow-up visits as told by your health care provider. This is  important. Contact a health care provider if: You keep feeling nauseous or you keep vomiting. You feel light-headed. You are still sleepy or having trouble with balance after 24 hours. You develop a rash. You have a fever. You have redness or swelling around the IV site. Get help right away if: You have trouble breathing. You have new-onset confusion at home. Summary For several hours after your procedure, you may feel tired. You may also be forgetful and have poor judgment. Have a responsible adult stay with you for the time you are told. It is important to have someone help care for you until you are awake and alert. Rest as told. Do not drive or operate machinery. Do not drink alcohol or take sleeping pills. Get help right away if you have trouble breathing, or if you suddenly become confused. This information is not intended to replace advice given to you by your health care provider. Make sure you discuss any questions you have with your healthcare provider. Document Revised: 07/12/2020 Document Reviewed: 09/29/2019 Elsevier Patient Education  2022 [employer].

## 2021-05-15 ENCOUNTER — Encounter (HOSPITAL_COMMUNITY)
Admission: RE | Admit: 2021-05-15 | Discharge: 2021-05-15 | Disposition: A | Discharge status: Home / Self Care | Payer: Medicare Other | Source: Ambulatory Visit | Attending: Internal Medicine | Admitting: Internal Medicine

## 2021-05-15 ENCOUNTER — Encounter (HOSPITAL_COMMUNITY): Payer: Self-pay

## 2021-05-15 ENCOUNTER — Other Ambulatory Visit: Payer: Self-pay

## 2021-05-15 HISTORY — DX: Unspecified osteoarthritis, unspecified site: M19.90

## 2021-05-15 NOTE — Telephone Encounter (Signed)
BP's improving but still mildly elevated, please increase norvasc to 10mg  daily  Zandra Abts MD

## 2021-05-16 ENCOUNTER — Encounter: Payer: Self-pay | Admitting: Nurse Practitioner

## 2021-05-16 ENCOUNTER — Ambulatory Visit (INDEPENDENT_AMBULATORY_CARE_PROVIDER_SITE_OTHER): Payer: Medicare Other | Admitting: Nurse Practitioner

## 2021-05-16 VITALS — BP 146/78 | HR 67 | Temp 97.2°F | Resp 20 | Ht 71.0 in | Wt 182.0 lb

## 2021-05-16 DIAGNOSIS — I10 Essential (primary) hypertension: Secondary | ICD-10-CM | POA: Diagnosis not present

## 2021-05-16 MED ORDER — AMLODIPINE BESYLATE 10 MG PO TABS: 10.0000 mg | ORAL_TABLET | Freq: Every day | ORAL | 3 refills | Status: DC

## 2021-05-16 NOTE — Patient Instructions (Signed)
https://www.nhlbi.nih.gov/files/docs/public/heart/dash_brief.pdf">  DASH Eating Plan DASH stands for Dietary Approaches to Stop Hypertension. The DASH eating plan is a healthy eating plan that has been shown to: Reduce high blood pressure (hypertension). Reduce your risk for type 2 diabetes, heart disease, and stroke. Help with weight loss. What are tips for following this plan? Reading food labels Check food labels for the amount of salt (sodium) per serving. Choose foods with less than 5 percent of the Daily Value of sodium. Generally, foods with less than 300 milligrams (mg) of sodium per serving fit into this eating plan. To find whole grains, look for the word "whole" as the first word in the ingredient list. Shopping Buy products labeled as "low-sodium" or "no salt added." Buy fresh foods. Avoid canned foods and pre-made or frozen meals. Cooking Avoid adding salt when cooking. Use salt-free seasonings or herbs instead of table salt or sea salt. Check with your health care provider or pharmacist before using salt substitutes. Do not fry foods. Cook foods using healthy methods such as baking, boiling, grilling, roasting, and broiling instead. Cook with heart-healthy oils, such as olive, canola, avocado, soybean, or sunflower oil. Meal planning  Eat a balanced diet that includes: 4 or more servings of fruits and 4 or more servings of vegetables each day. Try to fill one-half of your plate with fruits and vegetables. 6-8 servings of whole grains each day. Less than 6 oz (170 g) of lean meat, poultry, or fish each day. A 3-oz (85-g) serving of meat is about the same size as a deck of cards. One egg equals 1 oz (28 g). 2-3 servings of low-fat dairy each day. One serving is 1 cup (237 mL). 1 serving of nuts, seeds, or beans 5 times each week. 2-3 servings of heart-healthy fats. Healthy fats called omega-3 fatty acids are found in foods such as walnuts, flaxseeds, fortified milks, and eggs.  These fats are also found in cold-water fish, such as sardines, salmon, and mackerel. Limit how much you eat of: Canned or prepackaged foods. Food that is high in trans fat, such as some fried foods. Food that is high in saturated fat, such as fatty meat. Desserts and other sweets, sugary drinks, and other foods with added sugar. Full-fat dairy products. Do not salt foods before eating. Do not eat more than 4 egg yolks a week. Try to eat at least 2 vegetarian meals a week. Eat more home-cooked food and less restaurant, buffet, and fast food.  Lifestyle When eating at a restaurant, ask that your food be prepared with less salt or no salt, if possible. If you drink alcohol: Limit how much you use to: 0-1 drink a day for women who are not pregnant. 0-2 drinks a day for men. Be aware of how much alcohol is in your drink. In the [place] one drink equals one 12 oz bottle of beer (355 mL), one 5 oz glass of wine (148 mL), or one 1 oz glass of hard liquor (44 mL). General information Avoid eating more than 2,300 mg of salt a day. If you have hypertension, you may need to reduce your sodium intake to 1,500 mg a day. Work with your health care provider to maintain a healthy body weight or to lose weight. Ask what an ideal weight is for you. Get at least 30 minutes of exercise that causes your heart to beat faster (aerobic exercise) most days of the week. Activities may include walking, swimming, or biking. Work with your health care provider   or dietitian to adjust your eating plan to your individual calorie needs. What foods should I eat? Fruits All fresh, dried, or frozen fruit. Canned fruit in natural juice (without addedsugar). Vegetables Fresh or frozen vegetables (raw, steamed, roasted, or grilled). Low-sodium or reduced-sodium tomato and vegetable juice. Low-sodium or reduced-sodium tomatosauce and tomato paste. Low-sodium or reduced-sodium canned vegetables. Grains Whole-grain or  whole-wheat bread. Whole-grain or whole-wheat pasta. Brown rice. Oatmeal. Quinoa. Bulgur. Whole-grain and low-sodium cereals. Pita bread.Low-fat, low-sodium crackers. Whole-wheat flour tortillas. Meats and other proteins Skinless chicken or turkey. Ground chicken or turkey. Pork with fat trimmed off. Fish and seafood. Egg whites. Dried beans, peas, or lentils. Unsalted nuts, nut butters, and seeds. Unsalted canned beans. Lean cuts of beef with fat trimmed off. Low-sodium, lean precooked or cured meat, such as sausages or meatloaves. Dairy Low-fat (1%) or fat-free (skim) milk. Reduced-fat, low-fat, or fat-free cheeses. Nonfat, low-sodium ricotta or cottage cheese. Low-fat or nonfatyogurt. Low-fat, low-sodium cheese. Fats and oils Soft margarine without trans fats. Vegetable oil. Reduced-fat, low-fat, or light mayonnaise and salad dressings (reduced-sodium). Canola, safflower, olive, avocado, soybean, andsunflower oils. Avocado. Seasonings and condiments Herbs. Spices. Seasoning mixes without salt. Other foods Unsalted popcorn and pretzels. Fat-free sweets. The items listed above may not be a complete list of foods and beverages you can eat. Contact a dietitian for more information. What foods should I avoid? Fruits Canned fruit in a light or heavy syrup. Fried fruit. Fruit in cream or buttersauce. Vegetables Creamed or fried vegetables. Vegetables in a cheese sauce. Regular canned vegetables (not low-sodium or reduced-sodium). Regular canned tomato sauce and paste (not low-sodium or reduced-sodium). Regular tomato and vegetable juice(not low-sodium or reduced-sodium). Pickles. Olives. Grains Baked goods made with fat, such as croissants, muffins, or some breads. Drypasta or rice meal packs. Meats and other proteins Fatty cuts of meat. Ribs. Fried meat. Bacon. Bologna, salami, and other precooked or cured meats, such as sausages or meat loaves. Fat from the back of a pig (fatback). Bratwurst.  Salted nuts and seeds. Canned beans with added salt. Canned orsmoked fish. Whole eggs or egg yolks. Chicken or turkey with skin. Dairy Whole or 2% milk, cream, and half-and-half. Whole or full-fat cream cheese. Whole-fat or sweetened yogurt. Full-fat cheese. Nondairy creamers. Whippedtoppings. Processed cheese and cheese spreads. Fats and oils Butter. Stick margarine. Lard. Shortening. Ghee. Bacon fat. Tropical oils, suchas coconut, palm kernel, or palm oil. Seasonings and condiments Onion salt, garlic salt, seasoned salt, table salt, and sea salt. Worcestershire sauce. Tartar sauce. Barbecue sauce. Teriyaki sauce. Soy sauce, including reduced-sodium. Steak sauce. Canned and packaged gravies. Fish sauce. Oyster sauce. Cocktail sauce. Store-bought horseradish. Ketchup. Mustard. Meat flavorings and tenderizers. Bouillon cubes. Hot sauces. Pre-made or packaged marinades. Pre-made or packaged taco seasonings. Relishes. Regular saladdressings. Other foods Salted popcorn and pretzels. The items listed above may not be a complete list of foods and beverages you should avoid. Contact a dietitian for more information. Where to find more information National Heart, Lung, and Blood Institute: www.nhlbi.nih.gov American Heart Association: www.heart.org Academy of Nutrition and Dietetics: www.eatright.org National Kidney Foundation: www.kidney.org Summary The DASH eating plan is a healthy eating plan that has been shown to reduce high blood pressure (hypertension). It may also reduce your risk for type 2 diabetes, heart disease, and stroke. When on the DASH eating plan, aim to eat more fresh fruits and vegetables, whole grains, lean proteins, low-fat dairy, and heart-healthy fats. With the DASH eating plan, you should limit salt (sodium) intake to 2,300   mg a day. If you have hypertension, you may need to reduce your sodium intake to 1,500 mg a day. Work with your health care provider or dietitian to adjust  your eating plan to your individual calorie needs. This information is not intended to replace advice given to you by your health care provider. Make sure you discuss any questions you have with your healthcare provider. Document Revised: 09/30/2019 Document Reviewed: 09/30/2019 Elsevier Patient Education  2022 Elsevier Inc.  

## 2021-05-16 NOTE — Telephone Encounter (Signed)
Patient agrees to increase amlodipine to 10 mg daily. I e-scribed to [facility]

## 2021-05-16 NOTE — Progress Notes (Signed)
   Subjective:    Patient ID: Michael Valenzuela, male    DOB: 1956/05/29, 65 y.o.   MRN: [id number]  Chief Complaint: Recheck BP   HPI Patient comes in to day to discuss his blood pressure. He is currently on losartan 100mg  daily and amlodipine 10mg  daily. He saw cardiology on 05/06/21 and they added amlodipine 5 mg. Patient says they called him today and changed his amlodipine to 10mg . His blood pressure ahs been runnng 170-017 systolic. He is scheduled for TEE tomorrow.     Review of Systems  Constitutional:  Positive for fatigue. Negative for diaphoresis.  Eyes:  Negative for pain.  Respiratory:  Negative for shortness of breath.   Cardiovascular:  Negative for chest pain, palpitations and leg swelling.  Gastrointestinal:  Negative for abdominal pain.  Endocrine: Negative for polydipsia.  Skin:  Negative for rash.  Neurological:  Positive for weakness. Negative for dizziness and headaches.  Hematological:  Does not bruise/bleed easily.  All other systems reviewed and are negative.     Objective:   Physical Exam Vitals and nursing note reviewed.  Constitutional:      Appearance: Normal appearance.  Cardiovascular:     Rate and Rhythm: Normal rate. Rhythm irregular.  Pulmonary:     Effort: Pulmonary effort is normal.     Breath sounds: Normal breath sounds.  Skin:    General: Skin is warm.  Neurological:     General: No focal deficit present.     Mental Status: He is alert and oriented to person, place, and time.  Psychiatric:        Mood and Affect: Mood normal.        Behavior: Behavior normal.   BP (!) 146/78   Pulse 67   Temp (!) 97.2 F (36.2 C) (Temporal)   Resp 20   Ht 5\' 11"  (1.803 m)   Wt 182 lb (82.6 kg)   SpO2 97%   BMI 25.38 kg/m          Assessment & Plan:  Michael Valenzuela in today with chief complaint of Recheck BP   1. Essential hypertension, benign Increase amlodipine as instructed by cardiology Keep appointment for TEE Continue to monitor blood  pressure at home and keep diary     The above assessment and management plan was discussed with the patient. The patient verbalized understanding of and has agreed to the management plan. Patient is aware to call the clinic if symptoms persist or worsen. Patient is aware when to return to the clinic for a follow-up visit. Patient educated on when it is appropriate to go to the emergency department.   [name] Done, FNP

## 2021-05-17 ENCOUNTER — Ambulatory Visit (HOSPITAL_BASED_OUTPATIENT_CLINIC_OR_DEPARTMENT_OTHER): Payer: Medicare Other

## 2021-05-17 ENCOUNTER — Ambulatory Visit (HOSPITAL_COMMUNITY)
Admission: RE | Admit: 2021-05-17 | Discharge: 2021-05-17 | Disposition: A | Discharge status: Home / Self Care | Payer: Medicare Other | Attending: Internal Medicine | Admitting: Internal Medicine

## 2021-05-17 ENCOUNTER — Ambulatory Visit (HOSPITAL_COMMUNITY): Payer: Medicare Other | Admitting: Anesthesiology

## 2021-05-17 ENCOUNTER — Other Ambulatory Visit: Payer: Self-pay

## 2021-05-17 ENCOUNTER — Encounter (HOSPITAL_COMMUNITY): Payer: Self-pay | Admitting: Internal Medicine

## 2021-05-17 ENCOUNTER — Encounter (HOSPITAL_COMMUNITY)
Admission: RE | Disposition: A | Discharge status: Home / Self Care | Payer: Self-pay | Source: Home / Self Care | Attending: Internal Medicine

## 2021-05-17 DIAGNOSIS — Z952 Presence of prosthetic heart valve: Secondary | ICD-10-CM | POA: Diagnosis not present

## 2021-05-17 DIAGNOSIS — E119 Type 2 diabetes mellitus without complications: Secondary | ICD-10-CM | POA: Diagnosis not present

## 2021-05-17 DIAGNOSIS — Z7901 Long term (current) use of anticoagulants: Secondary | ICD-10-CM | POA: Insufficient documentation

## 2021-05-17 DIAGNOSIS — Z8249 Family history of ischemic heart disease and other diseases of the circulatory system: Secondary | ICD-10-CM | POA: Diagnosis not present

## 2021-05-17 DIAGNOSIS — Z79899 Other long term (current) drug therapy: Secondary | ICD-10-CM | POA: Insufficient documentation

## 2021-05-17 DIAGNOSIS — Z87891 Personal history of nicotine dependence: Secondary | ICD-10-CM | POA: Insufficient documentation

## 2021-05-17 DIAGNOSIS — I10 Essential (primary) hypertension: Secondary | ICD-10-CM | POA: Insufficient documentation

## 2021-05-17 DIAGNOSIS — Z7982 Long term (current) use of aspirin: Secondary | ICD-10-CM | POA: Insufficient documentation

## 2021-05-17 DIAGNOSIS — Z7984 Long term (current) use of oral hypoglycemic drugs: Secondary | ICD-10-CM | POA: Insufficient documentation

## 2021-05-17 DIAGNOSIS — Z833 Family history of diabetes mellitus: Secondary | ICD-10-CM | POA: Diagnosis not present

## 2021-05-17 DIAGNOSIS — I4891 Unspecified atrial fibrillation: Secondary | ICD-10-CM

## 2021-05-17 DIAGNOSIS — I340 Nonrheumatic mitral (valve) insufficiency: Secondary | ICD-10-CM

## 2021-05-17 HISTORY — PX: TEE WITHOUT CARDIOVERSION: SHX5443

## 2021-05-17 HISTORY — PX: CARDIOVERSION: SHX1299

## 2021-05-17 LAB — GLUCOSE, CAPILLARY: Glucose-Capillary: 106 mg/dL — ABNORMAL HIGH (ref 70–99)

## 2021-05-17 LAB — ECHO TEE: Area-P 1/2: 1.44 cm2

## 2021-05-17 SURGERY — ECHOCARDIOGRAM, TRANSESOPHAGEAL
Anesthesia: General

## 2021-05-17 MED ORDER — LIDOCAINE HCL (PF) 2 % IJ SOLN
INTRAMUSCULAR | Status: AC
  Filled 2021-05-17: qty 5

## 2021-05-17 MED ORDER — ORAL CARE MOUTH RINSE: 15.0000 mL | Freq: Once | OROMUCOSAL | Status: AC

## 2021-05-17 MED ORDER — CHLORHEXIDINE GLUCONATE 0.12 % MT SOLN
15.0000 mL | Freq: Once | OROMUCOSAL | Status: AC
  Administered 2021-05-17: 15 mL via OROMUCOSAL

## 2021-05-17 MED ORDER — PHENYLEPHRINE 40 MCG/ML (10ML) SYRINGE FOR IV PUSH (FOR BLOOD PRESSURE SUPPORT)
PREFILLED_SYRINGE | INTRAVENOUS | Status: DC | PRN
  Administered 2021-05-17: 80 ug via INTRAVENOUS

## 2021-05-17 MED ORDER — SODIUM CHLORIDE 0.9 % IV SOLN: INTRAVENOUS | Status: DC

## 2021-05-17 MED ORDER — LACTATED RINGERS IV SOLN: INTRAVENOUS | Status: DC

## 2021-05-17 MED ORDER — PHENYLEPHRINE 40 MCG/ML (10ML) SYRINGE FOR IV PUSH (FOR BLOOD PRESSURE SUPPORT)
PREFILLED_SYRINGE | INTRAVENOUS | Status: AC
  Filled 2021-05-17: qty 20

## 2021-05-17 MED ORDER — LIDOCAINE 2% (20 MG/ML) 5 ML SYRINGE
INTRAMUSCULAR | Status: DC | PRN
  Administered 2021-05-17: 100 mg via INTRAVENOUS

## 2021-05-17 MED ORDER — PROPOFOL 10 MG/ML IV BOLUS
INTRAVENOUS | Status: AC
  Filled 2021-05-17: qty 40

## 2021-05-17 MED ORDER — PROPOFOL 10 MG/ML IV BOLUS
INTRAVENOUS | Status: DC | PRN
  Administered 2021-05-17: 50 mg via INTRAVENOUS
  Administered 2021-05-17 (×2): 30 mg via INTRAVENOUS
  Administered 2021-05-17: 40 mg via INTRAVENOUS
  Administered 2021-05-17: 100 mg via INTRAVENOUS

## 2021-05-17 MED ORDER — EPHEDRINE SULFATE-NACL 50-0.9 MG/10ML-% IV SOSY
PREFILLED_SYRINGE | INTRAVENOUS | Status: DC | PRN
  Administered 2021-05-17: 5 mg via INTRAVENOUS

## 2021-05-17 NOTE — Progress Notes (Signed)
  Echocardiogram Echocardiogram Transesophageal has been performed.  Michael Valenzuela 05/17/2021, 1:37 PM

## 2021-05-17 NOTE — Progress Notes (Signed)
Electrical Cardioversion Procedure Note Michael Valenzuela [id number] 1956/11/10  Procedure: Electrical Cardioversion Indications:  Atrial Fibrillation and mitral valve regurgitation  Procedure Details Consent: Risks of procedure as well as the alternatives and risks of each were explained to the (patient/caregiver).  Consent for procedure obtained. Time Out: Verified patient identification, verified procedure, site/side was marked, verified correct patient position, special equipment/implants available, medications/allergies/relevent history reviewed, required imaging and test results available.  Performed  Patient placed on cardiac monitor, pulse oximetry, supplemental oxygen as necessary.  Sedation given: Muscle relaxants Muscles relaxants not used, ONLY propofol  Pacer pads placed anterior and posterior chest.  Cardioverted 1 time(s).  Cardioverted at [place].  Evaluation Findings: Post procedure EKG shows: NSR with some junctional Complications: None Patient did tolerate procedure well.   Michael Valenzuela 05/17/2021, 1:11 PM

## 2021-05-17 NOTE — Anesthesia Postprocedure Evaluation (Signed)
Anesthesia Post Note  Patient: Michael Valenzuela  Procedure(s) Performed: TRANSESOPHAGEAL ECHOCARDIOGRAM (TEE) CARDIOVERSION  Patient location during evaluation: PACU Anesthesia Type: General Level of consciousness: awake and alert and oriented Pain management: pain level controlled Vital Signs Assessment: post-procedure vital signs reviewed and stable Respiratory status: spontaneous breathing and respiratory function stable Cardiovascular status: blood pressure returned to baseline and stable Postop Assessment: no apparent nausea or vomiting Anesthetic complications: no   No notable events documented.   Last Vitals:  Vitals:   05/17/21 1345 05/17/21 1400  BP: (!) 148/95 (!) 155/92  Pulse: 76 79  Resp: (!) 9 12  Temp:  36.7 C  SpO2: 95% 95%    Last Pain:  Vitals:   05/17/21 1400  TempSrc: Oral  PainSc: 3                  Tuvia Woodrick C Samy Ryner

## 2021-05-17 NOTE — Anesthesia Procedure Notes (Signed)
Date/Time: 05/17/2021 12:37 PM Performed by: Orlie Dakin, CRNA Pre-anesthesia Checklist: Patient identified, Emergency Drugs available, Suction available and Patient being monitored Patient Re-evaluated:Patient Re-evaluated prior to induction Oxygen Delivery Method: Nasal cannula Induction Type: IV induction Placement Confirmation: positive ETCO2

## 2021-05-17 NOTE — Transfer of Care (Signed)
Immediate Anesthesia Transfer of Care Note  Patient: Michael Valenzuela  Procedure(s) Performed: TRANSESOPHAGEAL ECHOCARDIOGRAM (TEE) CARDIOVERSION  Patient Location: PACU  Anesthesia Type:General  Level of Consciousness: awake  Airway & Oxygen Therapy: Patient Spontanous Breathing and Patient connected to nasal cannula oxygen  Post-op Assessment: Report given to RN and Post -op Vital signs reviewed and stable  Post vital signs: Reviewed and stable  Last Vitals:  Vitals Value Taken Time  BP    Temp    Pulse    Resp    SpO2      Last Pain:  Vitals:   05/17/21 1131  TempSrc: Oral  PainSc: 5       Patients Stated Pain Goal: 7 (80/16/55 3748)  Complications: No notable events documented.

## 2021-05-17 NOTE — CV Procedure (Signed)
Transesophageal echo  Patient sedated by anesthesia with Propofol intravenously  Throat numbed with viscous lidocaine Mouth guard placed TEE probe advanced to mid esophagus without difficulty  Full report to follow in CV section of chart after images reviewed  No thrombus noted in LA   The L atrial appendage had been clipped   No flow seen  Dorris Carnes MD

## 2021-05-17 NOTE — Anesthesia Preprocedure Evaluation (Signed)
Anesthesia Evaluation  Patient identified by MRN, date of birth, ID band Patient awake    Reviewed: Allergy & Precautions, NPO status , Patient's Chart, lab work & pertinent test results  History of Anesthesia Complications Negative for: history of anesthetic complications  Airway Mallampati: III  TM Distance: >3 FB Neck ROM: Full   Comment:  Note -Hoarseness after MVR  Dental  (+) Dental Advisory Given, Chipped   Pulmonary former smoker,    Pulmonary exam normal breath sounds clear to auscultation       Cardiovascular hypertension, Pt. on medications +CHF  + dysrhythmias Atrial Fibrillation + Valvular Problems/Murmurs (Mitral valve Replacement)  Rhythm:Irregular Rate:Normal     Neuro/Psych Anxiety  Neuromuscular disease    GI/Hepatic negative GI ROS, Neg liver ROS,   Endo/Other  diabetes, Well Controlled, Type 2, Oral Hypoglycemic Agents  Renal/GU negative Renal ROS     Musculoskeletal  (+) Arthritis , Fibromyalgia -  Abdominal   Peds  Hematology   Anesthesia Other Findings Echocardiogram results --> Post Mitral Valve Replacement for Severe MitralRegurgitation.  Normal Left Ventricle function - Ejection Fraction 60-65% (normal range).Cannot assess wall motion b/c Atrial Fibrillation. Normal Right Ventricle.  Aortic Valve calcification - Sclerosis without narrowing (stenosis).  Mitral Valve Bioprosthesis in place - some of the data suggests obstructive process vs. Patient prosthesis mismatch. MV mean gradient is 10.1 mmHg (peak 22.2 mmHg). No Mitral Regurgitation. ==> Recommendation is TransesophagealEchocardiogram to better evaluate.   He is scheduled to see Dr. Harl Bowie on 6/27 as post-hospital f/u. (I was only involved b/c I did the Pre-Op Cath).  Note -Hoarseness after MVR   Reproductive/Obstetrics                          Anesthesia Physical Anesthesia Plan  ASA: 3  Anesthesia  Plan: General   Post-op Pain Management:    Induction: Intravenous  PONV Risk Score and Plan: Propofol infusion  Airway Management Planned: Nasal Cannula and Natural Airway  Additional Equipment:   Intra-op Plan:   Post-operative Plan:   Informed Consent: I have reviewed the patients History and Physical, chart, labs and discussed the procedure including the risks, benefits and alternatives for the proposed anesthesia with the patient or authorized representative who has indicated his/her understanding and acceptance.     Dental advisory given  Plan Discussed with: CRNA and Surgeon  Anesthesia Plan Comments:        Anesthesia Quick Evaluation

## 2021-05-17 NOTE — CV Procedure (Signed)
Cardioversion  Pt sedated by anesthesia with Propofol  With pads in AP position patient cardioverted to SR with 200 J synchronized biphasic energy     Procedure was without complications  12 lead EKG pending  Dorris Carnes MD

## 2021-05-19 NOTE — H&P (Signed)
Cardiology Admission History and Physical:   Patient ID: Michael Valenzuela MRN: [id number]; DOB: 1956/08/23   Admission date: 05/17/2021  PCP:  Chevis Pretty, FNP     Chief Complaint:  Pt presents for TEE to evaluate MV and to r/o thrombus  Patient Profile:   Michael Valenzuela is a 65 y.o. male with  who is being seen 05/19/2021 for the evaluation of MV dz   Presents for TE.  History of Present Illness:   Michael Valenzuela is a 65 yo who recently underent MV replacement.   Seen in clnic in late June    Echo showed elevated mean gradient through the valve    Set up for TEE to evaluate.  IN clinic pt was in atrial fibrillation  If TEE neg for thrombus would cardiovert     Past Medical History:  Diagnosis Date   Arthritis    Chronic low back pain    Diabetes mellitus without complication (HCC)    Fibromyalgia    Hyperlipidemia    Hypertension    Vitamin D deficiency     Past Surgical History:  Procedure Laterality Date   HERNIA REPAIR     MAZE N/A 03/08/2021   Procedure: MAZE;  Surgeon: Wonda Olds, MD;  Location: [place] [facility];  Service: Open Heart Surgery;  Laterality: N/A;   MITRAL VALVE REPAIR N/A 03/08/2021   Procedure: MITRAL VALVE REPAIR USING  PHYSIO RING SIZE 28 (EXPLANTED) THEN MITRAL VALVE REPLACEMENT (MVR) WITH MAGNA EASE MITRAL VALVE SIZE 25MM  ON PUMP, LEFT ATRIAL APPENDAGE CLIPPING USING A SIZE 50 ATRIAL CLIP;  Surgeon: Wonda Olds, MD;  Location: [facility];  Service: Open Heart Surgery;  Laterality: N/A;   RIGHT/LEFT HEART CATH AND CORONARY ANGIOGRAPHY N/A 03/05/2021   Procedure: RIGHT/LEFT HEART CATH AND CORONARY ANGIOGRAPHY;  Surgeon: Leonie Man, MD;  Location: [facility];  Service: Cardiovascular;  Laterality: N/A;   TEE WITHOUT CARDIOVERSION N/A 03/05/2021   Procedure: TRANSESOPHAGEAL ECHOCARDIOGRAM (TEE);  Surgeon: Donato Heinz, MD;  Location: [place];  Service: Cardiovascular;  Laterality: N/A;   TEE WITHOUT CARDIOVERSION N/A 03/08/2021    Procedure: TRANSESOPHAGEAL ECHOCARDIOGRAM (TEE);  Surgeon: Wonda Olds, MD;  Location: [place];  Service: Open Heart Surgery;  Laterality: N/A;     Medications Prior to Admission: Prior to Admission medications   Medication Sig Start Date End Date Taking? Authorizing Provider  amiodarone (PACERONE) 200 MG tablet Take 1 tablet (200 mg total) by mouth daily. 05/06/21  Yes Branch, Alphonse Guild, MD  amLODipine (NORVASC) 10 MG tablet Take 1 tablet (10 mg total) by mouth daily. 05/16/21 08/14/21 Yes Branch, Alphonse Guild, MD  apixaban (ELIQUIS) 5 MG TABS tablet Take 1 tablet (5 mg total) by mouth 2 (two) times daily. 04/15/21  Yes Hassell Done, Mary-Margaret, FNP  aspirin EC 81 MG tablet Take 1 tablet (81 mg total) by mouth daily. 09/18/14  Yes Cherre Robins, PharmD  cholecalciferol (VITAMIN D3) 25 MCG (1000 UNIT) tablet Take 1,000 Units by mouth daily.   Yes [provider]  losartan (COZAAR) 100 MG tablet TAKE 1 TABLET(100 MG) BY MOUTH DAILY 04/15/21  Yes Hassell Done, Mary-Margaret, FNP  Menthol-Camphor (ICY HOT ADVANCED PAIN RELIEF) 16-11 % CREA Apply 1 application topically daily as needed (pain).   Yes [provider]  metFORMIN (GLUCOPHAGE) 1000 MG tablet Take 1 tablet (1,000 mg total) by mouth 2 (two) times daily with a meal. TAKE 1 TABLET(1000 MG) BY MOUTH TWICE DAILY WITH A MEAL 04/15/21  Yes Hassell Done,  Mary-Margaret, FNP  Multiple Vitamins-Minerals (MULTIVITAMIN WITH MINERALS) tablet Take 1 tablet by mouth daily.   Yes [provider]  oxyCODONE (ROXICODONE) 15 MG immediate release tablet Take 15 mg by mouth every 6 (six) hours as needed for pain. 01/12/19  Yes [provider]  rosuvastatin (CRESTOR) 10 MG tablet Take 1 tablet (10 mg total) by mouth daily. 04/15/21  Yes Martin, Mary-Margaret, FNP  sitaGLIPtin (JANUVIA) 100 MG tablet TAKE 1 TABLET(100 MG) BY MOUTH DAILY 04/15/21  Yes Hassell Done, Mary-Margaret, FNP  Turmeric Curcumin 500 MG CAPS Take 500 mg by mouth daily.   Yes [provider]  glucose blood test strip TrueTrack strips. Test BS qd. Dx E11.9 04/19/21   Chevis Pretty, FNP     Allergies:   No Known Allergies  Social History:   Social History   Socioeconomic History   Marital status: Married    Spouse name: [name]   Number of children: 2   Years of education: 13   Highest education level: Some college, no degree  Occupational History   Occupation: Retired    Fish farm manager: [employer]   Occupation: disabled  Tobacco Use   Smoking status: Former    Packs/day: 0.50    Years: 20.00    Pack years: 10.00    Types: Cigarettes    Quit date: 02/09/2019    Years since quitting: 2.2   Smokeless tobacco: Never  Vaping Use   Vaping Use: Never used  Substance and Sexual Activity   Alcohol use: Yes    Comment: occasional   Drug use: No   Sexual activity: Not Currently  Other Topics Concern   Not on file  Social History Narrative   Patient has fibromyalgia and has significant pain with that. He has two adult children and is married. He has one grandchild.    Social Determinants of Health   Financial Resource Strain: Low Risk    Difficulty of Paying Living Expenses: Not hard at all  Food Insecurity: No Food Insecurity   Worried About Charity fundraiser in the Last Year: Never true   [place] in the Last Year: Never true  Transportation Needs: No Transportation Needs   Lack of Transportation (Medical): No   Lack of Transportation (Non-Medical): No  Physical Activity: Sufficiently Active   Days of Exercise per Week: 7 days   Minutes of Exercise per Session: 30 min  Stress: No Stress Concern Present   Feeling of Stress : Not at all  Social Connections: Moderately Isolated   Frequency of Communication with Friends and Family: Once a week   Frequency of Social Gatherings with Friends and Family: More than three times a week   Attends Religious Services: Never   Marine scientist or Organizations: No   Attends Programme researcher, broadcasting/film/video: Never   Marital Status: Married  Human resources officer Violence: Not At Risk   Fear of Current or Ex-Partner: No   Emotionally Abused: No   Physically Abused: No   Sexually Abused: No    Family History:   The patient's family history includes Cancer in his mother; Diabetes in his mother and paternal uncle; Diabetes (age of onset: 60) in his son; Heart attack in his brother; Heart disease in his father and mother; Hypertension in his son.    ROS:  Please see the history of present illness.  All other ROS reviewed and negative.     Physical Exam/Data:   Vitals:   05/17/21 1315  05/17/21 1330 05/17/21 1345 05/17/21 1400  BP: (!) 157/98 (!) 157/96 (!) 148/95 (!) 155/92  Pulse: 74 76 76 79  Resp:  18 (!) 9 12  Temp:    98 F (36.7 C)  TempSrc:    Oral  SpO2: 98% 97% 95% 95%  Weight:      Height:       No intake or output data in the 24 hours ending 05/19/21 2306 Last 3 Weights 05/17/2021 05/16/2021 05/15/2021  Weight (lbs) 182 lb 1.6 oz 182 lb 185 lb  Weight (kg) 82.6 kg 82.555 kg 83.915 kg     Body mass index is 25.4 kg/m.  General:  Well nourished, well developed, in no acute distress HEENT: normal Lymph: no adenopathy Neck: no JVD Endocrine:  No thryomegaly Vascular: No carotid bruits; FA pulses 2+ bilaterally without bruits  Cardiac:  normal S1, S2; Irreg irreg   NoS3  Lungs:  clear to auscultation bilaterally, no wheezing, rhonchi or rales  Abd: soft, nontender, no hepatomegaly  Ext: no LE  edema Musculoskeletal:  No deformities, BUE and BLE strength normal and equal Skin: warm and dry  Neuro:  CNs 2-12 intact, no focal abnormalities noted Psych:  Normal affect    EKG:  The ECG that was done not done   Laboratory Data:  High Sensitivity Troponin:  No results for input(s): TROPONINIHS in the last 720 hours.    ChemistryNo results for input(s): NA, K, CL, CO2, GLUCOSE, BUN, CREATININE, CALCIUM, GFRNONAA, GFRAA, ANIONGAP in the last 168 hours.  No  results for input(s): PROT, ALBUMIN, AST, ALT, ALKPHOS, BILITOT in the last 168 hours. HematologyNo results for input(s): WBC, RBC, HGB, HCT, MCV, MCH, MCHC, RDW, PLT in the last 168 hours. BNPNo results for input(s): BNP, PROBNP in the last 168 hours.  DDimer No results for input(s): DDIMER in the last 168 hours.   Radiology/Studies:  No results found.   Assessment and Plan:   Pt with hx of MR, s/p VP replacement    Plan for TEE to evaluate MV and with atrial fibrillatoin to r/o thrombus   If neg plan for cardioversion  Risks/benefits described   Pt understands and agrees to proceed.   Signed, Dorris Carnes, MD  05/19/2021 11:06 PM

## 2021-05-27 ENCOUNTER — Encounter (HOSPITAL_COMMUNITY): Payer: Self-pay | Admitting: Internal Medicine

## 2021-07-07 NOTE — Progress Notes (Signed)
Cardiology Office Note  Date: 07/08/2021   ID: Michael Valenzuela, DOB [birth year], MRN [id number]  PCP:  Michael Pretty, FNP  Cardiologist:  Michael Dolly, MD Electrophysiologist:  None   Chief Complaint: 55-monthfollow-up  History of Present Illness: Michael Valenzuela a 65y.o. male with a history of numbness DM2, HLD, ED HTN, vitamin D deficiency, chronic low back pain fibromyalgia, status post mitral valve replacement, LAA appendage clipping, Maze procedure   He was last seen by Michael Valenzuela 05/06/2021.  Had recent admission with diagnosis of severe MR on 02/27/2021.  On 03/08/2021 he had mitral valve replacement with along with left atrial appendage clipping and maze procedure.  June 2022 echocardiogram showed mitral valve replacement with mean gradient of 10 mmHg.  Had no recent shortness of breath or DOE.  He had left-sided soreness at the incision site.  Had newly diagnosed atrial fibrillation/flutter during April 2022 admission.  He had been on amiodarone 100 mg 3 times daily since discharge with no recent palpitations.  Home blood pressures were 10000000to [id number].  Due to elevated mean gradient of 10 mmHg.  Plan was for TEE for closer evaluation for any valvular dysfunction.  He had no recent symptoms.  Plan was to lower amiodarone to 200 mg daily.  Plan was to try DCCV after TEE.  If failing amiodarone would continue with sinus rhythm with focus on rate control.  Had Maze procedure during mitral valve replacement surgery.  Blood pressure was elevated.  He had come off some of his prior BP meds after mitral valve replacement surgery.  Norvasc 5 mg was restarted.  Plan was to call BP log in 1 week.  Plan for TEE to evaluate for MVR and elevated gradients.  DCCV to follow for persistent atrial fibrillation.  He underwent DCCV by Dr. RHarrington Challengeron 05/17/2021.  He converted to sinus rhythm after 1 200 J shock.  He is here status post DC cardioversion on 05/17/2022.  EKG today shows normal sinus  rhythm rate of 68, moderate voltage criteria for LVH, may be normal variant, nonspecific ST and T wave abnormality.  He states he still sore after cardioversion.  He denies any anginal or exertional symptoms, palpitations or arrhythmias, orthostatic symptoms, CVA or TIA-like symptoms, PND, orthopnea, claudication-like symptoms, DVT or PE-like symptoms, or lower extremity edema.  He states yesterday and today he has had some blood in his stool and his urine.  This is new for him.  He is on both Coumadin and aspirin.  Past Medical History:  Diagnosis Date   Arthritis    Chronic low back pain    Diabetes mellitus without complication ([place]    Fibromyalgia    Hyperlipidemia    Hypertension    Vitamin D deficiency     Past Surgical History:  Procedure Laterality Date   CARDIOVERSION N/A 05/17/2021   Procedure: CARDIOVERSION;  Surgeon: RFay Records MD;  Location: [facility];  Service: Cardiovascular;  Laterality: N/A;   HERNIA REPAIR     MAZE N/A 03/08/2021   Procedure: MAZE;  Surgeon: AWonda Olds MD;  [facility]: [facility]  Service: Open Heart Surgery;  Laterality: N/A;   MITRAL VALVE REPAIR N/A 03/08/2021   Procedure: MITRAL VALVE REPAIR USING  PHYSIO RING SIZE 28 (EXPLANTED) THEN MITRAL VALVE REPLACEMENT (MVR) WITH MAGNA EASE MITRAL VALVE SIZE 25MM  ON PUMP, LEFT ATRIAL APPENDAGE CLIPPING USING A SIZE 50 ATRIAL CLIP;  Surgeon: AWonda Olds MD;  Location[place] [facility]  Service: Open Heart Surgery;  Laterality: N/A;   RIGHT/LEFT HEART CATH AND CORONARY ANGIOGRAPHY N/A 03/05/2021   Procedure: RIGHT/LEFT HEART CATH AND CORONARY ANGIOGRAPHY;  Surgeon: Leonie Man, MD;  Location: [facility];  Service: Cardiovascular;  Laterality: N/A;   TEE WITHOUT CARDIOVERSION N/A 03/05/2021   Procedure: TRANSESOPHAGEAL ECHOCARDIOGRAM (TEE);  Surgeon: Donato Heinz, MD;  Location: [facility];  Service: Cardiovascular;  Laterality: N/A;   TEE WITHOUT CARDIOVERSION N/A 03/08/2021   Procedure:  TRANSESOPHAGEAL ECHOCARDIOGRAM (TEE);  Surgeon: Wonda Olds, MD;  Location: [facility];  Service: Open Heart Surgery;  Laterality: N/A;   TEE WITHOUT CARDIOVERSION N/A 05/17/2021   Procedure: TRANSESOPHAGEAL ECHOCARDIOGRAM (TEE);  Surgeon: Fay Records, MD;  [facility]: [facility];  Service: Cardiovascular;  Laterality: N/A;    Current Outpatient Medications  Medication Sig Dispense Refill   amiodarone (PACERONE) 200 MG tablet Take 1 tablet (200 mg total) by mouth daily. 90 tablet 3   amLODipine (NORVASC) 10 MG tablet Take 1 tablet (10 mg total) by mouth daily. 90 tablet 3   cholecalciferol (VITAMIN D3) 25 MCG (1000 UNIT) tablet Take 1,000 Units by mouth daily.     glucose blood test strip TrueTrack strips. Test BS qd. Dx E11.9 100 each 3   losartan (COZAAR) 100 MG tablet TAKE 1 TABLET(100 MG) BY MOUTH DAILY 90 tablet 1   Menthol-Camphor (ICY HOT ADVANCED PAIN RELIEF) 16-11 % CREA Apply 1 application topically daily as needed (pain).     metFORMIN (GLUCOPHAGE) 1000 MG tablet Take 1 tablet (1,000 mg total) by mouth 2 (two) times daily with a meal. TAKE 1 TABLET(1000 MG) BY MOUTH TWICE DAILY WITH A MEAL 180 tablet 1   Multiple Vitamins-Minerals (MULTIVITAMIN WITH MINERALS) tablet Take 1 tablet by mouth daily.     oxyCODONE (ROXICODONE) 15 MG immediate release tablet Take 15 mg by mouth every 6 (six) hours as needed for pain.     rosuvastatin (CRESTOR) 10 MG tablet Take 1 tablet (10 mg total) by mouth daily. 90 tablet 1   sitaGLIPtin (JANUVIA) 100 MG tablet TAKE 1 TABLET(100 MG) BY MOUTH DAILY 90 tablet 1   Turmeric Curcumin 500 MG CAPS Take 500 mg by mouth daily.     apixaban (ELIQUIS) 5 MG TABS tablet Take 1 tablet (5 mg total) by mouth 2 (two) times daily. 60 tablet 6   No current facility-administered medications for this visit.   Allergies:  Patient has no known allergies.   Social History: The patient  reports that he quit smoking about 2 years ago. His smoking use included cigarettes. He has a  10.00 pack-year smoking history. He has never used smokeless tobacco. He reports current alcohol use. He reports that he does not use drugs.   Family History: The patient's family history includes Cancer in his mother; Diabetes in his mother and paternal uncle; Diabetes (age of onset: 11) in his son; Heart attack in his brother; Heart disease in his father and mother; Hypertension in his son.   ROS:  Please see the history of present illness. Otherwise, complete review of systems is positive for none.  All other systems are reviewed and negative.   Physical Exam: VS:  BP (!) 142/80   Pulse 70   Ht '5\' 11"'$  (1.803 m)   Wt 197 lb 9.6 oz (89.6 kg)   SpO2 97%   BMI 27.56 kg/m , BMI Body mass index is 27.56 kg/m.  Wt Readings from Last 3 Encounters:  07/08/21 197 lb  9.6 oz (89.6 kg)  05/17/21 182 lb 1.6 oz (82.6 kg)  05/16/21 182 lb (82.6 kg)    General: Patient appears comfortable at rest. Neck: Supple, no elevated JVP or carotid bruits, no thyromegaly. Lungs: Clear to auscultation, nonlabored breathing at rest. Cardiac: Regular rate and rhythm, no S3 or significant systolic murmur, no pericardial rub. Extremities: No pitting edema, distal pulses 2+. Skin: Warm and dry. Musculoskeletal: No kyphosis. Neuropsychiatric: Alert and oriented x3, affect grossly appropriate.  ECG: EKG today normal sinus rhythm rate of 68, moderate voltage criteria for LVH may be normal variant, nonspecific ST and T wave abnormality  Recent Labwork: 03/01/2021: B Natriuretic Peptide 301.0 03/02/2021: TSH 4.498 03/09/2021: Magnesium 2.6 04/15/2021: ALT 25; AST 29; BUN 15; Creatinine, Ser 0.76; Hemoglobin 11.0; Platelets 393; Potassium 4.7; Sodium 137     Component Value Date/Time   CHOL 101 04/15/2021 1251   CHOL 142 01/28/2013 1216   TRIG 135 04/15/2021 1251   TRIG 242 (H) 03/19/2015 1020   TRIG 299 (H) 01/28/2013 1216   HDL 43 04/15/2021 1251   HDL 34 (L) 03/19/2015 1020   HDL 35 (L) 01/28/2013 1216    CHOLHDL 2.3 04/15/2021 1251   LDLCALC 35 04/15/2021 1251   LDLCALC 63 06/09/2014 0959   LDLCALC 47 01/28/2013 1216    Other Studies Reviewed Today  DCCV 05/17/2021. Cardioversion   Pt sedated by anesthesia with Propofol   With pads in AP position patient cardioverted to SR with 200 J synchronized biphasic energy      Procedure was without complications   12 lead EKG pending   Dorris Carnes MD     TEE 05/17/2021  1. Left ventricular ejection fraction, by estimation, is 55 to 60%. The left ventricle has normal function. 2. Right ventricular systolic function is normal. The right ventricular size is normal. 3. LA appendage has been clipped surgically No flow seen. 4. 25 mm Magna ease Mitral valve present (03/08/21) Valve is well seated. Leaflets thin without significant restricted motion. No obstruction seen. Peak and mean gradients through the valve are 13 and 5 mm Hg respectively MVA by PT1/2 is 1.44 cm2. By planimetry from 3 D images were 1.45 cm2. (HR 65bpm). Mild mitral valve regurgitation.   Diagnostic Studies TEE 03/05/21: IMPRESSIONS   1. Left ventricular ejection fraction, by estimation, is 60 to 65%. The  left ventricle has normal function.   2. Right ventricular systolic function is normal. The right ventricular  size is mildly enlarged.   3. Left atrial size was severely dilated. No left atrial/left atrial  appendage thrombus was detected.   4. Right atrial size was mildly dilated.   5. The aortic valve is tricuspid. Aortic valve regurgitation is not  visualized.   6. The mitral valve is abnormal. Flail P2. Severe mitral valve  regurgitation. MR severe by both 2D ERO and 3D VCA. Systolic flow reversal  in pulmonary veins, consistent with severe MR.   Cath 03/05/21: The left ventricular systolic function is normal. The left ventricular ejection fraction is 55-65% by visual estimate. LV end diastolic pressure is mildly elevated. There is moderate (3+) mitral  regurgitation. Hemodynamic findings consistent with mild pulmonary hypertension. Angiographically normal coronary arteries   SUMMARY Angiographically Normal Coronary Arteries Mild Pulmonary Hypertension with mean PAP 30 mmHg, PCWP 5 mmHg with LVEDP 15 mmHg. Mild-moderately reduced Cardiac Output-Cardiac Index (Fick) 4.44-2.12.   Assessment and Plan:  1. Mitral valve disease   2. Atrial fibrillation, unspecified type ([place])   3. Essential  hypertension, benign   4. Paroxysmal atrial fibrillation (HCC)    1. Mitral valve disease Status post mitral valve replacement.  Recent TEE showed 25 mm Magna ease Mitral valve present (03/08/21) Valve is well seated. Leaflets thin without significant restricted motion. No obstruction seen. Peak and mean gradients through the valve are 13 and 5 mm Hg respectively MVA by PT1/2 is 1.44 cm2. By planimetry from 3 D images were 1.45 cm2. (HR 65bpm). Mild mitral valve regurgitation. He is doing well.  Denies any issues.   2. Atrial fibrillation, unspecified type ([place]) Recent DC cardioversion on 06/06/2021 by Dr. Harrington Valenzuela with one 200 J shock converting back to normal sinus rhythm.  EKG today shows continued normal sinus rhythm with a rate of 68, moderate voltage criteria for LVH.  May be normal variant.  Patient states he had some recent blood in his stool and his urine.  He is on both aspirin and Eliquis.  Stop aspirin for now and call us back in a week and let us know if continued bleeding.  Continue amiodarone 200 mg p.o. daily.  Continue Eliquis 5 mg p.o. twice daily.   3. Essential hypertension, benign Blood pressure is elevated today at 142/80.  Patient states his systolic blood pressures at home are usually in the 117 in the 120s range.  Continue amlodipine 10 mg daily, losartan 100 mg daily.  4.  Hyperlipidemia Continue Crestor 10 mg daily.   Medication Adjustments/Labs and Tests Ordered: Current medicines are reviewed at length with the patient today.   Concerns regarding medicines are outlined above.   Disposition: Follow-up with follow-up in 1 month Dr. Harl Bowie or APP 1 month  Signed, Levell July, NP 07/08/2021 2:56 PM    Michael Valenzuela, [place], [place] [address] Phone: [phone]; Fax: [phone]

## 2021-07-08 ENCOUNTER — Ambulatory Visit (INDEPENDENT_AMBULATORY_CARE_PROVIDER_SITE_OTHER): Payer: Medicare Other | Admitting: Family Medicine

## 2021-07-08 ENCOUNTER — Other Ambulatory Visit: Payer: Self-pay

## 2021-07-08 ENCOUNTER — Encounter: Payer: Self-pay | Admitting: Family Medicine

## 2021-07-08 VITALS — BP 142/80 | HR 70 | Ht 71.0 in | Wt 197.6 lb

## 2021-07-08 DIAGNOSIS — I10 Essential (primary) hypertension: Secondary | ICD-10-CM | POA: Diagnosis not present

## 2021-07-08 DIAGNOSIS — I4891 Unspecified atrial fibrillation: Secondary | ICD-10-CM | POA: Diagnosis not present

## 2021-07-08 DIAGNOSIS — I059 Rheumatic mitral valve disease, unspecified: Secondary | ICD-10-CM | POA: Diagnosis not present

## 2021-07-08 DIAGNOSIS — I480 Paroxysmal atrial fibrillation: Secondary | ICD-10-CM | POA: Diagnosis not present

## 2021-07-08 MED ORDER — APIXABAN 5 MG PO TABS: 5.0000 mg | ORAL_TABLET | Freq: Two times a day (BID) | ORAL | 1 refills | Status: DC

## 2021-07-08 MED ORDER — APIXABAN 5 MG PO TABS: 5.0000 mg | ORAL_TABLET | Freq: Two times a day (BID) | ORAL | 6 refills | Status: DC

## 2021-07-08 NOTE — Patient Instructions (Addendum)
Medication Instructions:  Your physician has recommended you make the following change in your medication:  Stop aspirin Continue other medications the same  Labwork: none  Testing/Procedures: none  Follow-Up: Your physician recommends that you schedule a follow-up appointment in: 1 month  Any Other Special Instructions Will Be Listed Below (If Applicable).  If you need a refill on your cardiac medications before your next appointment, please call your pharmacy.

## 2021-07-10 NOTE — Progress Notes (Signed)
Agree with stopping aspirin and remaing on eliquis  Zandra Abts MD

## 2021-07-14 ENCOUNTER — Other Ambulatory Visit: Payer: Self-pay | Admitting: Nurse Practitioner

## 2021-07-14 DIAGNOSIS — I480 Paroxysmal atrial fibrillation: Secondary | ICD-10-CM

## 2021-07-22 ENCOUNTER — Ambulatory Visit (INDEPENDENT_AMBULATORY_CARE_PROVIDER_SITE_OTHER): Payer: Medicare Other | Admitting: Nurse Practitioner

## 2021-07-22 ENCOUNTER — Encounter: Payer: Self-pay | Admitting: Nurse Practitioner

## 2021-07-22 ENCOUNTER — Other Ambulatory Visit: Payer: Self-pay

## 2021-07-22 VITALS — BP 132/83 | HR 64 | Temp 98.0°F | Resp 20 | Ht 71.0 in | Wt 199.0 lb

## 2021-07-22 DIAGNOSIS — I10 Essential (primary) hypertension: Secondary | ICD-10-CM

## 2021-07-22 DIAGNOSIS — E559 Vitamin D deficiency, unspecified: Secondary | ICD-10-CM

## 2021-07-22 DIAGNOSIS — I480 Paroxysmal atrial fibrillation: Secondary | ICD-10-CM

## 2021-07-22 DIAGNOSIS — Z6830 Body mass index (BMI) 30.0-30.9, adult: Secondary | ICD-10-CM

## 2021-07-22 DIAGNOSIS — Z23 Encounter for immunization: Secondary | ICD-10-CM

## 2021-07-22 DIAGNOSIS — E785 Hyperlipidemia, unspecified: Secondary | ICD-10-CM

## 2021-07-22 DIAGNOSIS — E1169 Type 2 diabetes mellitus with other specified complication: Secondary | ICD-10-CM

## 2021-07-22 DIAGNOSIS — I340 Nonrheumatic mitral (valve) insufficiency: Secondary | ICD-10-CM

## 2021-07-22 LAB — BAYER DCA HB A1C WAIVED: HB A1C (BAYER DCA - WAIVED): 6 % — ABNORMAL HIGH (ref 4.8–5.6)

## 2021-07-22 NOTE — Progress Notes (Signed)
Subjective:    Patient ID: Michael Valenzuela, male    DOB: [birth year], 65 y.o.   MRN: [id number]   Chief Complaint: Medical Management of Chronic Issues    HPI:  1. Type 2 diabetes mellitus with other specified complication, without long-term current use of insulin (HCC) Fasting bood sugars are running aroun 120-150. He denies any low blood sugars. Lab Results  Component Value Date   HGBA1C 6.0 04/15/2021     2. Hyperlipidemia with target LDL less than 100 Does try to watch idet but does no dedicated exercise. Lab Results  Component Value Date   CHOL 101 04/15/2021   HDL 43 04/15/2021   LDLCALC 35 04/15/2021   TRIG 135 04/15/2021   CHOLHDL 2.3 04/15/2021     3. Essential hypertension, benign No c/o chest pain, sob or headache. Does check blood pressure aat hme. Running 829'F systolic. BP Readings from Last 3 Encounters:  07/22/21 132/83  07/08/21 (!) 142/80  05/17/21 (!) 155/92     4. Paroxysmal atrial fibrillation (HCC) Denies palpitation or heart racing. He was cardioverted in august.  5. Severe mitral regurgitation See cardilogy every 6 months. Last visit was August 28,2022. According to office note, no change made to plan of care.  6. Vitamin D deficiency Takes daily vitamin d supplement  7. BMI 27.0-327.9,adult No recent weight changes Wt Readings from Last 3 Encounters:  07/22/21 199 lb (90.3 kg)  07/08/21 197 lb 9.6 oz (89.6 kg)  05/17/21 182 lb 1.6 oz (82.6 kg)   BMI Readings from Last 3 Encounters:  07/22/21 27.75 kg/m  07/08/21 27.56 kg/m  05/17/21 25.40 kg/m       Outpatient Encounter Medications as of 07/22/2021  Medication Sig   amiodarone (PACERONE) 200 MG tablet Take 1 tablet (200 mg total) by mouth daily.   amLODipine (NORVASC) 10 MG tablet Take 1 tablet (10 mg total) by mouth daily.   cholecalciferol (VITAMIN D3) 25 MCG (1000 UNIT) tablet Take 1,000 Units by mouth daily.   ELIQUIS 5 MG TABS tablet TAKE 1 TABLET(5 MG) BY MOUTH TWICE  DAILY   glucose blood test strip TrueTrack strips. Test BS qd. Dx E11.9   losartan (COZAAR) 100 MG tablet TAKE 1 TABLET(100 MG) BY MOUTH DAILY   Menthol-Camphor (ICY HOT ADVANCED PAIN RELIEF) 16-11 % CREA Apply 1 application topically daily as needed (pain).   metFORMIN (GLUCOPHAGE) 1000 MG tablet Take 1 tablet (1,000 mg total) by mouth 2 (two) times daily with a meal. TAKE 1 TABLET(1000 MG) BY MOUTH TWICE DAILY WITH A MEAL   Multiple Vitamins-Minerals (MULTIVITAMIN WITH MINERALS) tablet Take 1 tablet by mouth daily.   oxyCODONE (ROXICODONE) 15 MG immediate release tablet Take 15 mg by mouth every 6 (six) hours as needed for pain.   rosuvastatin (CRESTOR) 10 MG tablet Take 1 tablet (10 mg total) by mouth daily.   sitaGLIPtin (JANUVIA) 100 MG tablet TAKE 1 TABLET(100 MG) BY MOUTH DAILY   Turmeric Curcumin 500 MG CAPS Take 500 mg by mouth daily.   No facility-administered encounter medications on file as of 07/22/2021.    Past Surgical History:  Procedure Laterality Date   CARDIOVERSION N/A 05/17/2021   Procedure: CARDIOVERSION;  Surgeon: Fay Records, MD;  Location: [facility];  Service: Cardiovascular;  Laterality: N/A;   HERNIA REPAIR     MAZE N/A 03/08/2021   Procedure: MAZE;  Surgeon: Wonda Olds, MD;  Location: [facility];  Service: Open Heart Surgery;  Laterality: N/A;   MITRAL VALVE  REPAIR N/A 03/08/2021   Procedure: MITRAL VALVE REPAIR USING  PHYSIO RING SIZE 28 (EXPLANTED) THEN MITRAL VALVE REPLACEMENT (MVR) WITH MAGNA EASE MITRAL VALVE SIZE 25MM  ON PUMP, LEFT ATRIAL APPENDAGE CLIPPING USING A SIZE 50 ATRIAL CLIP;  Surgeon: Wonda Olds, MD;  Location: [facility];  Service: Open Heart Surgery;  Laterality: N/A;   RIGHT/LEFT HEART CATH AND CORONARY ANGIOGRAPHY N/A 03/05/2021   Procedure: RIGHT/LEFT HEART CATH AND CORONARY ANGIOGRAPHY;  Surgeon: Leonie Man, MD;  Location: [facility];  Service: Cardiovascular;  Laterality: N/A;   TEE WITHOUT CARDIOVERSION N/A 03/05/2021    Procedure: TRANSESOPHAGEAL ECHOCARDIOGRAM (TEE);  Surgeon: Donato Heinz, MD;  Location: [facility];  Service: Cardiovascular;  Laterality: N/A;   TEE WITHOUT CARDIOVERSION N/A 03/08/2021   Procedure: TRANSESOPHAGEAL ECHOCARDIOGRAM (TEE);  Surgeon: Wonda Olds, MD;  Location: [facility];  Service: Open Heart Surgery;  Laterality: N/A;   TEE WITHOUT CARDIOVERSION N/A 05/17/2021   Procedure: TRANSESOPHAGEAL ECHOCARDIOGRAM (TEE);  Surgeon: Fay Records, MD;  Location[facility];  Service: Cardiovascular;  Laterality: N/A;    Family History  Problem Relation Age of Onset   Heart disease Mother    Diabetes Mother    Cancer Mother    Heart disease Father    Diabetes Son 79       type 1   Diabetes Paternal Uncle    Heart attack Brother    Hypertension Son     New complaints: None today  Social history: Lives with wife- sees pain management for chronic back pain  Controlled substance contract: n/a     Review of Systems  Constitutional:  Negative for diaphoresis.  Eyes:  Negative for pain.  Respiratory:  Negative for shortness of breath.   Cardiovascular:  Negative for chest pain, palpitations and leg swelling.  Gastrointestinal:  Negative for abdominal pain.  Endocrine: Negative for polydipsia.  Musculoskeletal:  Positive for back pain (chronic).  Skin:  Negative for rash.  Neurological:  Negative for dizziness, weakness and headaches.  Hematological:  Does not bruise/bleed easily.  All other systems reviewed and are negative.     Objective:   Physical Exam Vitals and nursing note reviewed.  Constitutional:      Appearance: Normal appearance. He is well-developed.  HENT:     Head: Normocephalic.     Nose: Nose normal.  Eyes:     Pupils: Pupils are equal, round, and reactive to light.  Neck:     Thyroid: No thyroid mass or thyromegaly.     Vascular: No carotid bruit or JVD.     Trachea: Phonation normal.  Cardiovascular:     Rate and Rhythm: Normal rate.  Rhythm irregular.  Pulmonary:     Effort: Pulmonary effort is normal. No respiratory distress.     Breath sounds: Normal breath sounds.  Abdominal:     General: Bowel sounds are normal.     Palpations: Abdomen is soft.     Tenderness: There is no abdominal tenderness.  Musculoskeletal:        General: Normal range of motion.     Cervical back: Normal range of motion and neck supple.     Comments: Rises slowly form sitting to standing [place] with lip on both sides  Lymphadenopathy:     Cervical: No cervical adenopathy.  Skin:    General: Skin is warm and dry.  Neurological:     Mental Status: He is alert and oriented to person, place, and time.  Psychiatric:  Behavior: Behavior normal.        Thought Content: Thought content normal.        Judgment: Judgment normal.   BP 132/83   Pulse 64   Temp 98 F (36.7 C) (Temporal)   Resp 20   Ht _0  (1.803 m)   Wt 199 lb (90.3 kg)   SpO2 98%   BMI 27.75 kg/m   HGBA1c 6.0%      Assessment & Plan:  Michael Valenzuela comes in today with chief complaint of Medical Management of Chronic Issues   Diagnosis and orders addressed:  1. Type 2 diabetes mellitus with other specified complication, without long-term current use of insulin (HCC) Contiue to watch carbsin diet - Bayer DCA Hb A1c Waived - Microalbumin / creatinine urine ratio  2. Hyperlipidemia with target LDL less than 100 Low fat diet - Lipid panel  3. Essential hypertension, benign Low sodium diet - CBC with Differential/Platelet - CMP14+EGFR  4. Paroxysmal atrial fibrillation (HCC) Avoid caffeine Keep follow up with cardiology  5. Severe mitral regurgitation  6. Vitamin D deficiency Daily vitamin d supplement  7. BMI 30.0-30.9,adult Discussed diet and exercise for person with BMI >25 Will recheck weight in 3-6 months    Labs pending Health Maintenance reviewed Diet and exercise encouraged  Follow up plan: 3 months   [name] Done,  FNP

## 2021-07-22 NOTE — Patient Instructions (Signed)

## 2021-07-22 NOTE — Addendum Note (Signed)
Addended by: Rolena Infante on: 07/22/2021 01:03 PM   Modules accepted: Orders

## 2021-07-23 LAB — CBC WITH DIFFERENTIAL/PLATELET
Basophils Absolute: 0.1 10*3/uL (ref 0.0–0.2)
Basos: 1 %
EOS (ABSOLUTE): 0.3 10*3/uL (ref 0.0–0.4)
Eos: 5 %
Hematocrit: 39.7 % (ref 37.5–51.0)
Hemoglobin: 12.2 g/dL — ABNORMAL LOW (ref 13.0–17.7)
Immature Grans (Abs): 0 10*3/uL (ref 0.0–0.1)
Immature Granulocytes: 0 %
Lymphocytes Absolute: 1.4 10*3/uL (ref 0.7–3.1)
Lymphs: 19 %
MCH: 24 pg — ABNORMAL LOW (ref 26.6–33.0)
MCHC: 30.7 g/dL — ABNORMAL LOW (ref 31.5–35.7)
MCV: 78 fL — ABNORMAL LOW (ref 79–97)
Monocytes Absolute: 0.7 10*3/uL (ref 0.1–0.9)
Monocytes: 10 %
Neutrophils Absolute: 4.8 10*3/uL (ref 1.4–7.0)
Neutrophils: 65 %
Platelets: 282 10*3/uL (ref 150–450)
RBC: 5.08 x10E6/uL (ref 4.14–5.80)
RDW: 18.8 % — ABNORMAL HIGH (ref 11.6–15.4)
WBC: 7.3 10*3/uL (ref 3.4–10.8)

## 2021-07-23 LAB — LIPID PANEL
Chol/HDL Ratio: 2.9 ratio (ref 0.0–5.0)
Cholesterol, Total: 134 mg/dL (ref 100–199)
HDL: 46 mg/dL (ref >39)
LDL Chol Calc (NIH): 53 mg/dL (ref 0–99)
Triglycerides: 221 mg/dL — ABNORMAL HIGH (ref 0–149)
VLDL Cholesterol Cal: 35 mg/dL (ref 5–40)

## 2021-07-23 LAB — CMP14+EGFR
ALT: 65 IU/L — ABNORMAL HIGH (ref 0–44)
AST: 76 IU/L — ABNORMAL HIGH (ref 0–40)
Albumin/Globulin Ratio: 1.9 (ref 1.2–2.2)
Albumin: 4.7 g/dL (ref 3.8–4.8)
Alkaline Phosphatase: 80 IU/L (ref 44–121)
BUN/Creatinine Ratio: 23 (ref 10–24)
BUN: 18 mg/dL (ref 8–27)
Bilirubin Total: <0.2 mg/dL (ref 0.0–1.2)
CO2: 22 mmol/L (ref 20–29)
Calcium: 9.9 mg/dL (ref 8.6–10.2)
Chloride: 100 mmol/L (ref 96–106)
Creatinine, Ser: 0.79 mg/dL (ref 0.76–1.27)
Globulin, Total: 2.5 g/dL (ref 1.5–4.5)
Glucose: 125 mg/dL — ABNORMAL HIGH (ref 65–99)
Potassium: 4.7 mmol/L (ref 3.5–5.2)
Sodium: 139 mmol/L (ref 134–144)
Total Protein: 7.2 g/dL (ref 6.0–8.5)
eGFR: 98 mL/min/{1.73_m2} (ref >59)

## 2021-07-23 LAB — MICROALBUMIN / CREATININE URINE RATIO
Creatinine, Urine: 138.3 mg/dL
Microalb/Creat Ratio: 17 mg/g creat (ref 0–29)
Microalbumin, Urine: 24.2 ug/mL

## 2021-08-04 NOTE — Progress Notes (Signed)
Cardiology Office Note  Date: 08/05/2021   ID: Michael Valenzuela, DOB 12/27/1955, MRN [id number]  PCP:  Chevis Pretty, FNP  Cardiologist:  Carlyle Dolly, MD Electrophysiologist:  None   Chief Complaint: 16-month follow-up  History of Present Illness: Michael Valenzuela is a 65 y.o. male with a history of numbness DM2, HLD, ED HTN, vitamin D deficiency, chronic low back pain fibromyalgia, status post mitral valve replacement, LAA appendage clipping, Maze procedure   He was last seen by Dr. Harl Valenzuela on 05/06/2021.  Had recent admission with diagnosis of severe MR on 02/27/2021.  On 03/08/2021 he had mitral valve replacement with along with left atrial appendage clipping and maze procedure.  June 2022 echocardiogram showed mitral valve replacement with mean gradient of 10 mmHg.  Had no recent shortness of breath or DOE.  He had left-sided soreness at the incision site.  Had newly diagnosed atrial fibrillation/flutter during April 2022 admission.  He had been on amiodarone 100 mg 3 times daily since discharge with no recent palpitations.  Home blood pressures were 409W to 119J systolic.  Due to elevated mean gradient of 10 mmHg.  Plan was for TEE for closer evaluation for any valvular dysfunction.  He had no recent symptoms.  Plan was to lower amiodarone to 200 mg daily.  Plan was to try DCCV after TEE.  If failing amiodarone would continue with sinus rhythm with focus on rate control.  Had Maze procedure during mitral valve replacement surgery.  Blood pressure was elevated.  He had come off some of his prior BP meds after mitral valve replacement surgery.  Norvasc 5 mg was restarted.  Plan was to call BP log in 1 week.  Plan for TEE to evaluate for MVR and elevated gradients.  DCCV to follow for persistent atrial fibrillation.  He underwent DCCV by Dr. Harrington Challenger on 05/17/2021.  He converted to sinus rhythm after 1 200 J shock.  He was last here status post DC cardioversion on 05/17/2022.  EKG demonstrated normal  sinus rhythm rate of 68, moderate voltage criteria for LVH, may be normal variant, nonspecific ST and T wave abnormality.  He was he still sore after cardioversion.  He denies any anginal or exertional symptoms, palpitations or arrhythmias, orthostatic symptoms, CVA or TIA-like symptoms, PND, orthopnea, claudication-like symptoms, DVT or PE-like symptoms, or lower extremity edema.  He states yesterday and today he has had some blood in his stool and his urine.  This is new for him.  He is on both Coumadin and aspirin.  Has the bleeding stopped after stopping aspirin.    Past Medical History:  Diagnosis Date   Arthritis    Chronic low back pain    Diabetes mellitus without complication ([place])    Fibromyalgia    Hyperlipidemia    Hypertension    Vitamin D deficiency     Past Surgical History:  Procedure Laterality Date   CARDIOVERSION N/A 05/17/2021   Procedure: CARDIOVERSION;  Surgeon: Fay Records, MD;  Location: [facility];  Service: Cardiovascular;  Laterality: N/A;   HERNIA REPAIR     MAZE N/A 03/08/2021   Procedure: MAZE;  Surgeon: Wonda Olds, MD;  Location: [facility];  Service: Open Heart Surgery;  Laterality: N/A;   MITRAL VALVE REPAIR N/A 03/08/2021   Procedure: MITRAL VALVE REPAIR USING  PHYSIO RING SIZE 28 (EXPLANTED) THEN MITRAL VALVE REPLACEMENT (MVR) WITH MAGNA EASE MITRAL VALVE SIZE 25MM  ON PUMP, LEFT ATRIAL APPENDAGE CLIPPING USING A SIZE 50 ATRIAL CLIP;  Surgeon: Wonda Olds, MD;  Location: [facility];  Service: Open Heart Surgery;  Laterality: N/A;   RIGHT/LEFT HEART CATH AND CORONARY ANGIOGRAPHY N/A 03/05/2021   Procedure: RIGHT/LEFT HEART CATH AND CORONARY ANGIOGRAPHY;  Surgeon: Leonie Man, MD;  Location: [facility];  Service: Cardiovascular;  Laterality: N/A;   TEE WITHOUT CARDIOVERSION N/A 03/05/2021   Procedure: TRANSESOPHAGEAL ECHOCARDIOGRAM (TEE);  Surgeon: Donato Heinz, MD;  Location: [facility];  Service: Cardiovascular;  Laterality: N/A;    TEE WITHOUT CARDIOVERSION N/A 03/08/2021   Procedure: TRANSESOPHAGEAL ECHOCARDIOGRAM (TEE);  Surgeon: Wonda Olds, MD;  Location: [facility];  Service: Open Heart Surgery;  Laterality: N/A;   TEE WITHOUT CARDIOVERSION N/A 05/17/2021   Procedure: TRANSESOPHAGEAL ECHOCARDIOGRAM (TEE);  Surgeon: Fay Records, MD;  Location: [facility];  Service: Cardiovascular;  Laterality: N/A;    Current Outpatient Medications  Medication Sig Dispense Refill   amiodarone (PACERONE) 200 MG tablet Take 1 tablet (200 mg total) by mouth daily. 90 tablet 3   amLODipine (NORVASC) 10 MG tablet Take 1 tablet (10 mg total) by mouth daily. 90 tablet 3   cholecalciferol (VITAMIN D3) 25 MCG (1000 UNIT) tablet Take 1,000 Units by mouth daily.     ciprofloxacin (CIPRO) 500 MG tablet Take 500 mg by mouth 2 (two) times daily.     ELIQUIS 5 MG TABS tablet TAKE 1 TABLET(5 MG) BY MOUTH TWICE DAILY 60 tablet 6   glucose blood test strip TrueTrack strips. Test BS qd. Dx E11.9 100 each 3   losartan (COZAAR) 100 MG tablet TAKE 1 TABLET(100 MG) BY MOUTH DAILY 90 tablet 1   Menthol-Camphor (ICY HOT ADVANCED PAIN RELIEF) 16-11 % CREA Apply 1 application topically daily as needed (pain).     metFORMIN (GLUCOPHAGE) 1000 MG tablet Take 1 tablet (1,000 mg total) by mouth 2 (two) times daily with a meal. TAKE 1 TABLET(1000 MG) BY MOUTH TWICE DAILY WITH A MEAL 180 tablet 1   Multiple Vitamins-Minerals (MULTIVITAMIN WITH MINERALS) tablet Take 1 tablet by mouth daily.     oxyCODONE (ROXICODONE) 15 MG immediate release tablet Take 15 mg by mouth every 6 (six) hours as needed for pain.     rosuvastatin (CRESTOR) 10 MG tablet Take 1 tablet (10 mg total) by mouth daily. 90 tablet 1   sitaGLIPtin (JANUVIA) 100 MG tablet TAKE 1 TABLET(100 MG) BY MOUTH DAILY 90 tablet 1   tamsulosin (FLOMAX) 0.4 MG CAPS capsule Take 0.4 mg by mouth daily.     Turmeric Curcumin 500 MG CAPS Take 500 mg by mouth daily.     No current facility-administered medications for  this visit.   Allergies:  Patient has no known allergies.   Social History: The patient  reports that he quit smoking about 2 years ago. His smoking use included cigarettes. He has a 10.00 pack-year smoking history. He has never used smokeless tobacco. He reports current alcohol use. He reports that he does not use drugs.   Family History: The patient's family history includes Cancer in his mother; Diabetes in his mother and paternal uncle; Diabetes (age of onset: 26) in his son; Heart attack in his brother; Heart disease in his father and mother; Hypertension in his son.   ROS:  Please see the history of present illness. Otherwise, complete review of systems is positive for none.  All other systems are reviewed and negative.   Physical Exam: VS:  BP 122/78   Pulse 74   Ht 5\' 11"  (1.803  m)   Wt 202 lb 12.8 oz (92 kg)   SpO2 96%   BMI 28.28 kg/m , BMI Body mass index is 28.28 kg/m.  Wt Readings from Last 3 Encounters:  08/05/21 202 lb 12.8 oz (92 kg)  07/22/21 199 lb (90.3 kg)  07/08/21 197 lb 9.6 oz (89.6 kg)    General: Patient appears comfortable at rest. Neck: Supple, no elevated JVP or carotid bruits, no thyromegaly. Lungs: Clear to auscultation, nonlabored breathing at rest. Cardiac: Regular rate and rhythm, no S3 or significant systolic murmur, no pericardial rub. Extremities: No pitting edema, distal pulses 2+. Skin: Warm and dry. Musculoskeletal: No kyphosis. Neuropsychiatric: Alert and oriented x3, affect grossly appropriate.  ECG: EKG today normal sinus rhythm rate of 68, moderate voltage criteria for LVH may be normal variant, nonspecific ST and T wave abnormality  Recent Labwork: 03/01/2021: B Natriuretic Peptide 301.0 03/02/2021: TSH 4.498 03/09/2021: Magnesium 2.6 07/22/2021: ALT 65; AST 76; BUN 18; Creatinine, Ser 0.79; Hemoglobin 12.2; Platelets 282; Potassium 4.7; Sodium 139     Component Value Date/Time   CHOL 134 07/22/2021 1114   CHOL 142 01/28/2013 1216    TRIG 221 (H) 07/22/2021 1114   TRIG 242 (H) 03/19/2015 1020   TRIG 299 (H) 01/28/2013 1216   HDL 46 07/22/2021 1114   HDL 34 (L) 03/19/2015 1020   HDL 35 (L) 01/28/2013 1216   CHOLHDL 2.9 07/22/2021 1114   LDLCALC 53 07/22/2021 1114   LDLCALC 63 06/09/2014 0959   LDLCALC 47 01/28/2013 1216    Other Studies Reviewed Today  DCCV 05/17/2021. Cardioversion   Pt sedated by anesthesia with Propofol   With pads in AP position patient cardioverted to SR with 200 J synchronized biphasic energy      Procedure was without complications   12 lead EKG pending   Dorris Carnes MD     TEE 05/17/2021  1. Left ventricular ejection fraction, by estimation, is 55 to 60%. The left ventricle has normal function. 2. Right ventricular systolic function is normal. The right ventricular size is normal. 3. LA appendage has been clipped surgically No flow seen. 4. 25 mm Magna ease Mitral valve present (03/08/21) Valve is well seated. Leaflets thin without significant restricted motion. No obstruction seen. Peak and mean gradients through the valve are 13 and 5 mm Hg respectively MVA by PT1/2 is 1.44 cm2. By planimetry from 3 D images were 1.45 cm2. (HR 65bpm). Mild mitral valve regurgitation.   Diagnostic Studies TEE 03/05/21: IMPRESSIONS   1. Left ventricular ejection fraction, by estimation, is 60 to 65%. The  left ventricle has normal function.   2. Right ventricular systolic function is normal. The right ventricular  size is mildly enlarged.   3. Left atrial size was severely dilated. No left atrial/left atrial  appendage thrombus was detected.   4. Right atrial size was mildly dilated.   5. The aortic valve is tricuspid. Aortic valve regurgitation is not  visualized.   6. The mitral valve is abnormal. Flail P2. Severe mitral valve  regurgitation. MR severe by both 2D ERO and 3D VCA. Systolic flow reversal  in pulmonary veins, consistent with severe MR.   Cath 03/05/21: The left ventricular  systolic function is normal. The left ventricular ejection fraction is 55-65% by visual estimate. LV end diastolic pressure is mildly elevated. There is moderate (3+) mitral regurgitation. Hemodynamic findings consistent with mild pulmonary hypertension. Angiographically normal coronary arteries   SUMMARY Angiographically Normal Coronary Arteries Mild Pulmonary Hypertension with mean  PAP 30 mmHg, PCWP 5 mmHg with LVEDP 15 mmHg. Mild-moderately reduced Cardiac Output-Cardiac Index ([place]) 4.44-2.12.   Assessment and Plan:  1. Mitral valve disease   2. Atrial fibrillation, unspecified type ([place])   3. Essential hypertension   4. Hyperlipidemia with target LDL less than 100     1. Mitral valve disease Status post mitral valve replacement 03/08/2021.  Recent TEE showed 25 mm Magna ease Mitral valve present (03/08/21) Valve is well seated. Leaflets thin without significant restricted motion. No obstruction seen. Peak and mean gradients through the valve are 13 and 5 mm Hg respectively MVA by PT1/2 is 1.44 cm2. by planimetry from 3 D images were 1.45 cm2. (HR 65bpm). Mild mitral valve regurgitation. He is doing well.  He denies any DOE, SOB, PND, orthopnea.  2. Atrial fibrillation, unspecified type ([place]) Recent DC cardioversion on 06/06/2021 by Dr. Harrington Challenger with one 200 J shock converting back to normal sinus rhythm.  EKG today shows continued normal sinus rhythm with a rate of 68, moderate voltage criteria for LVH.  May be normal variant.  Last visit had some recent blood in his stool and his urine.  His aspirin was stopped.  He states he has had no further bleeding since stopping the aspirin.  Continue Eliquis 5 mg p.o. twice daily.  Continue amiodarone 200 mg daily.  3. Essential hypertension, benign Blood pressure is well controlled today at 122 over 78 p.  Continue amlodipine 10 mg daily, losartan 100 mg daily.  4.  Hyperlipidemia Continue Crestor 10 mg daily.    Medication Adjustments/Labs and  Tests Ordered: Current medicines are reviewed at length with the patient today.  Concerns regarding medicines are outlined above.   Disposition: Follow-up with Dr. Harl Valenzuela or APP 6 months  Signed, Levell July, NP 08/05/2021 1:31 PM    [facility] [place], [place], [place] [address] Phone: [phone]; Fax: [phone]

## 2021-08-05 ENCOUNTER — Ambulatory Visit (INDEPENDENT_AMBULATORY_CARE_PROVIDER_SITE_OTHER): Payer: Medicare Other | Admitting: Family Medicine

## 2021-08-05 ENCOUNTER — Encounter: Payer: Self-pay | Admitting: Family Medicine

## 2021-08-05 VITALS — BP 122/78 | HR 74 | Ht 71.0 in | Wt 202.8 lb

## 2021-08-05 DIAGNOSIS — I10 Essential (primary) hypertension: Secondary | ICD-10-CM

## 2021-08-05 DIAGNOSIS — I4891 Unspecified atrial fibrillation: Secondary | ICD-10-CM

## 2021-08-05 DIAGNOSIS — E785 Hyperlipidemia, unspecified: Secondary | ICD-10-CM

## 2021-08-05 DIAGNOSIS — I059 Rheumatic mitral valve disease, unspecified: Secondary | ICD-10-CM | POA: Diagnosis not present

## 2021-08-05 NOTE — Patient Instructions (Signed)
Medication Instructions:  Continue all current medications.   Labwork: none  Testing/Procedures: none  Follow-Up: 6 months   Any Other Special Instructions Will Be Listed Below (If Applicable).   If you need a refill on your cardiac medications before your next appointment, please call your pharmacy.  

## 2021-10-01 ENCOUNTER — Other Ambulatory Visit: Payer: Self-pay | Admitting: Nurse Practitioner

## 2021-10-01 DIAGNOSIS — E1169 Type 2 diabetes mellitus with other specified complication: Secondary | ICD-10-CM

## 2021-10-21 ENCOUNTER — Ambulatory Visit (INDEPENDENT_AMBULATORY_CARE_PROVIDER_SITE_OTHER): Payer: Medicare Other | Admitting: Nurse Practitioner

## 2021-10-21 ENCOUNTER — Encounter: Payer: Self-pay | Admitting: Nurse Practitioner

## 2021-10-21 VITALS — BP 143/77 | HR 59 | Temp 98.2°F | Resp 20 | Ht 71.0 in | Wt 204.0 lb

## 2021-10-21 DIAGNOSIS — E1169 Type 2 diabetes mellitus with other specified complication: Secondary | ICD-10-CM

## 2021-10-21 DIAGNOSIS — I10 Essential (primary) hypertension: Secondary | ICD-10-CM

## 2021-10-21 DIAGNOSIS — Z6830 Body mass index (BMI) 30.0-30.9, adult: Secondary | ICD-10-CM

## 2021-10-21 DIAGNOSIS — I5033 Acute on chronic diastolic (congestive) heart failure: Secondary | ICD-10-CM

## 2021-10-21 DIAGNOSIS — I340 Nonrheumatic mitral (valve) insufficiency: Secondary | ICD-10-CM

## 2021-10-21 DIAGNOSIS — G8929 Other chronic pain: Secondary | ICD-10-CM

## 2021-10-21 DIAGNOSIS — I480 Paroxysmal atrial fibrillation: Secondary | ICD-10-CM | POA: Diagnosis not present

## 2021-10-21 DIAGNOSIS — E785 Hyperlipidemia, unspecified: Secondary | ICD-10-CM

## 2021-10-21 DIAGNOSIS — Z23 Encounter for immunization: Secondary | ICD-10-CM

## 2021-10-21 DIAGNOSIS — M5440 Lumbago with sciatica, unspecified side: Secondary | ICD-10-CM

## 2021-10-21 DIAGNOSIS — M797 Fibromyalgia: Secondary | ICD-10-CM

## 2021-10-21 DIAGNOSIS — E559 Vitamin D deficiency, unspecified: Secondary | ICD-10-CM

## 2021-10-21 LAB — BAYER DCA HB A1C WAIVED: HB A1C (BAYER DCA - WAIVED): 6.3 % — ABNORMAL HIGH (ref 4.8–5.6)

## 2021-10-21 MED ORDER — SITAGLIPTIN PHOSPHATE 100 MG PO TABS: ORAL_TABLET | ORAL | 1 refills | Status: DC

## 2021-10-21 MED ORDER — APIXABAN 5 MG PO TABS: ORAL_TABLET | ORAL | 6 refills | Status: DC

## 2021-10-21 MED ORDER — ROSUVASTATIN CALCIUM 10 MG PO TABS: 10.0000 mg | ORAL_TABLET | Freq: Every day | ORAL | 1 refills | Status: DC

## 2021-10-21 MED ORDER — LOSARTAN POTASSIUM 100 MG PO TABS: ORAL_TABLET | ORAL | 1 refills | Status: DC

## 2021-10-21 MED ORDER — AMIODARONE HCL 200 MG PO TABS: 200.0000 mg | ORAL_TABLET | Freq: Every day | ORAL | 3 refills | Status: DC

## 2021-10-21 MED ORDER — METFORMIN HCL 1000 MG PO TABS: 1000.0000 mg | ORAL_TABLET | Freq: Two times a day (BID) | ORAL | 1 refills | Status: DC

## 2021-10-21 NOTE — Patient Instructions (Signed)
Muscle Pain, Adult ?Muscle pain, also called myalgia, is a condition in which a person has pain in one or more muscles in the body. Muscle pain may be mild, moderate, or severe. It may feel sharp, achy, or burning. In most cases, the pain lasts only a short time and goes away without treatment. ?Muscle pain can result from using muscles in a new or different way or after a period of inactivity. It is normal to feel some muscle pain after starting an exercise program. Muscles that have not been used often will be sore at first. ?What are the causes? ?This condition is caused by using muscles in a new or different way after a period of inactivity. Other causes may include: ?Overuse or muscle strain, especially if you are not in shape. This is the most common cause of muscle pain. ?Injury or bruising. ?Infectious diseases, including diseases caused by viruses, such as the flu (influenza). ?Fibromyalgia.This is a long-term, or chronic, condition that causes muscle tenderness, tiredness (fatigue), and headache. ?Autoimmune or rheumatologic diseases. These are conditions, such as lupus, in which the body's defense system (immunesystem) attacks areas in the body. ?Certain medicines, including ACE inhibitors and statins. ?What are the signs or symptoms? ?The main symptom of this condition is sore or painful muscles, including during activity and when stretching. You may also have slight swelling. ?How is this diagnosed? ?This condition is diagnosed with a physical exam. Your health care provider will ask questions about your pain and when it began. If you have not had muscle pain for very long, your health care provider may want to wait before doing much testing. If your muscle pain has lasted a long time, tests may be done right away. In some cases, this may include tests to rule out certain conditions or illnesses. ?How is this treated? ?Treatment for this condition depends on the cause. Home care is often enough to  relieve muscle pain. Your health care provider may also prescribe NSAIDs, such as ibuprofen. ?Follow these instructions at home: ?Medicines ?Take over-the-counter and prescription medicines only as told by your health care provider. ?Ask your health care provider if the medicine prescribed to you requires you to avoid driving or using machinery. ?Managing pain, swelling, and discomfort ?  ?If directed, put ice on the painful area. To do this: ?Put ice in a plastic bag. ?Place a towel between your skin and the bag. ?Leave the ice on for 20 minutes, 2-3 times a day. ?For the first 2 days of muscle soreness, or if there is swelling: ?Do not soak in hot baths. ?Do not use a hot tub, steam room, sauna, heating pad, or other heat source. ?After 48-72 hours, you may alternate between applying ice and applying heat as told by your health care provider. If directed, apply heat to the affected area as often as told by your health care provider. Use the heat source that your health care provider recommends, such as a moist heat pack or a heating pad. ?Place a towel between your skin and the heat source. ?Leave the heat on for 20-30 minutes. ?Remove the heat if your skin turns bright red. This is especially important if you are unable to feel pain, heat, or cold. You may have a greater risk of getting burned. ?If you have an injury, raise (elevate) the injured area above the level of your heart while you are sitting or lying down. ?Activity ? ?If overuse is causing your muscle pain: ?Slow down your activities   until the pain goes away. ?Do regular, gentle exercises if you are not usually active. ?Warm up before exercising. Stretch before and after exercising. This can help lower the risk of muscle pain. ?Do not continue working out if the pain is severe. Severe pain could mean that you have injured a muscle. ?Do not lift anything that is heavier than 5-10 lb (2.3-4.5 kg), or the limit that you are told, until your health care  provider says that it is safe. ?Return to your normal activities as told by your health care provider. Ask your health care provider what activities are safe for you. ?General instructions ?Do not use any products that contain nicotine or tobacco, such as cigarettes, e-cigarettes, and chewing tobacco. These can delay healing. If you need help quitting, ask your health care provider. ?Keep all follow-up visits as told by your health care provider. This is important. ?Contact a health care provider if you have: ?Muscle pain that gets worse and medicines do not help. ?Muscle pain that lasts longer than 3 days. ?A rash or fever along with muscle pain. ?Muscle pain after a tick bite. ?Muscle pain while working out, even though you are in good physical condition. ?Redness, soreness, or swelling along with muscle pain. ?Muscle pain after starting a new medicine or changing the dose of a medicine. ?Get help right away if you have: ?Trouble breathing. ?Trouble swallowing. ?Muscle pain along with a stiff neck, fever, and vomiting. ?Severe muscle weakness or you cannot move part of your body. ?These symptoms may represent a serious problem that is an emergency. Do not wait to see if the symptoms will go away. Get medical help right away. Call your local emergency services (911 in the [place] Do not drive yourself to the hospital. ?Summary ?Muscle pain usually lasts only a short time and goes away without treatment. ?This condition is caused by using muscles in a new or different way after a period of inactivity. ?If your muscle pain lasts longer than 3 days, tell your health care provider. ?This information is not intended to replace advice given to you by your health care provider. Make sure you discuss any questions you have with your health care provider. ?Document Revised: 05/17/2021 Document Reviewed: 05/17/2021 ?Elsevier Patient Education ? 2022 [employer] ? ?

## 2021-10-21 NOTE — Progress Notes (Signed)
Subjective:    Patient ID: Michael Valenzuela, male    DOB: 18-Nov-1955, 65 y.o.   MRN: [id number]   Chief Complaint: medical management of chronic issues     HPI:  1. Essential hypertension, benign No c/o chest pain, sob or headache. Doe snot check blood pressure at home. BP Readings from Last 3 Encounters:  08/05/21 122/78  07/22/21 132/83  07/08/21 (!) 142/80     2. Acute on chronic heart failure with preserved ejection fraction (HFpEF)/in the setting of severe mitral valve prolapse 3. Paroxysmal atrial fibrillation (HCC) 4. Severe mitral regurgitation Last saw cardiology on 08/05/21. He had a recent cardioversion for atrial in July and is now in NSR at last check. No changes were made to plan of care. He will follow up in March.  5. Hyperlipidemia with target LDL less than 100 Does not watch diet and does little to no exercise. Lab Results  Component Value Date   CHOL 134 07/22/2021   HDL 46 07/22/2021   LDLCALC 53 07/22/2021   TRIG 221 (H) 07/22/2021   CHOLHDL 2.9 07/22/2021     6. Type 2 diabetes mellitus with other specified complication, without long-term current use of insulin (HCC) Fasting blood sugars are running around 116-150 most of time. Has occasional highs over 200.s any low blood sugars. Lab Results  Component Value Date   HGBA1C 6.0 (H) 07/22/2021     7. Vitamin D deficiency Is on daily vitamin d supplement  8. Chronic midline low back pain with sciatica, sciatica laterality unspecified 9. Fibromyalgia Pain is /10 currently. He sees pain management monthly. He says that nothing they do relieves his pain.  10. BMI 30.0-30.9,adult No recent weight changes Wt Readings from Last 3 Encounters:  10/21/21 204 lb (92.5 kg)  08/05/21 202 lb 12.8 oz (92 kg)  07/22/21 199 lb (90.3 kg)   BMI Readings from Last 3 Encounters:  10/21/21 28.45 kg/m  08/05/21 28.28 kg/m  07/22/21 27.75 kg/m      Outpatient Encounter Medications as of 10/21/2021   Medication Sig   metFORMIN (GLUCOPHAGE) 1000 MG tablet TAKE 1 TABLET(1000 MG) BY MOUTH TWICE DAILY WITH A MEAL   amiodarone (PACERONE) 200 MG tablet Take 1 tablet (200 mg total) by mouth daily.   amLODipine (NORVASC) 10 MG tablet Take 1 tablet (10 mg total) by mouth daily.   cholecalciferol (VITAMIN D3) 25 MCG (1000 UNIT) tablet Take 1,000 Units by mouth daily.   ciprofloxacin (CIPRO) 500 MG tablet Take 500 mg by mouth 2 (two) times daily.   ELIQUIS 5 MG TABS tablet TAKE 1 TABLET(5 MG) BY MOUTH TWICE DAILY   glucose blood test strip TrueTrack strips. Test BS qd. Dx E11.9   losartan (COZAAR) 100 MG tablet TAKE 1 TABLET(100 MG) BY MOUTH DAILY   Menthol-Camphor (ICY HOT ADVANCED PAIN RELIEF) 16-11 % CREA Apply 1 application topically daily as needed (pain).   Multiple Vitamins-Minerals (MULTIVITAMIN WITH MINERALS) tablet Take 1 tablet by mouth daily.   oxyCODONE (ROXICODONE) 15 MG immediate release tablet Take 15 mg by mouth every 6 (six) hours as needed for pain.   rosuvastatin (CRESTOR) 10 MG tablet Take 1 tablet (10 mg total) by mouth daily.   sitaGLIPtin (JANUVIA) 100 MG tablet TAKE 1 TABLET(100 MG) BY MOUTH DAILY   tamsulosin (FLOMAX) 0.4 MG CAPS capsule Take 0.4 mg by mouth daily.   Turmeric Curcumin 500 MG CAPS Take 500 mg by mouth daily.   No facility-administered encounter medications on file as of  10/21/2021.    Past Surgical History:  Procedure Laterality Date   CARDIOVERSION N/A 05/17/2021   Procedure: CARDIOVERSION;  Surgeon: Fay Records, MD;  Location: [facility];  Service: Cardiovascular;  Laterality: N/A;   HERNIA REPAIR     MAZE N/A 03/08/2021   Procedure: MAZE;  Surgeon: Wonda Olds, MD;  Location: [place];  Service: Open Heart Surgery;  Laterality: N/A;   MITRAL VALVE REPAIR N/A 03/08/2021   Procedure: MITRAL VALVE REPAIR USING  PHYSIO RING SIZE 28 (EXPLANTED) THEN MITRAL VALVE REPLACEMENT (MVR) WITH MAGNA EASE MITRAL VALVE SIZE 25MM  ON PUMP, LEFT ATRIAL APPENDAGE  CLIPPING USING A SIZE 50 ATRIAL CLIP;  Surgeon: Wonda Olds, MD;  Location: [place];  Service: Open Heart Surgery;  Laterality: N/A;   RIGHT/LEFT HEART CATH AND CORONARY ANGIOGRAPHY N/A 03/05/2021   Procedure: RIGHT/LEFT HEART CATH AND CORONARY ANGIOGRAPHY;  Surgeon: Leonie Man, MD;  Location: [place] [facility];  Service: Cardiovascular;  Laterality: N/A;   TEE WITHOUT CARDIOVERSION N/A 03/05/2021   Procedure: TRANSESOPHAGEAL ECHOCARDIOGRAM (TEE);  Surgeon: Donato Heinz, MD;  Location: [place];  Service: Cardiovascular;  Laterality: N/A;   TEE WITHOUT CARDIOVERSION N/A 03/08/2021   Procedure: TRANSESOPHAGEAL ECHOCARDIOGRAM (TEE);  Surgeon: Wonda Olds, MD;  Location: [place];  Service: Open Heart Surgery;  Laterality: N/A;   TEE WITHOUT CARDIOVERSION N/A 05/17/2021   Procedure: TRANSESOPHAGEAL ECHOCARDIOGRAM (TEE);  Surgeon: Fay Records, MD;  Location: [facility];  Service: Cardiovascular;  Laterality: N/A;    Family History  Problem Relation Age of Onset   Heart disease Mother    Diabetes Mother    Cancer Mother    Heart disease Father    Diabetes Son 70       type 1   Diabetes Paternal Uncle    Heart attack Brother    Hypertension Son     New complaints: None today  Social history: Lives with wife  Controlled substance contract: n/a     Review of Systems  Constitutional:  Negative for diaphoresis.  Eyes:  Negative for pain.  Respiratory:  Negative for shortness of breath.   Cardiovascular:  Negative for chest pain, palpitations and leg swelling.  Gastrointestinal:  Negative for abdominal pain.  Endocrine: Negative for polydipsia.  Musculoskeletal:  Positive for back pain (chronic - sees pain management).  Skin:  Negative for rash.  Neurological:  Negative for dizziness, weakness and headaches.  Hematological:  Does not bruise/bleed easily.  All other systems reviewed and are negative.     Objective:   Physical Exam Vitals and nursing note  reviewed.  Constitutional:      Appearance: Normal appearance. He is well-developed.  HENT:     Head: Normocephalic.     Nose: Nose normal.     Mouth/Throat:     Mouth: Mucous membranes are moist.     Pharynx: Oropharynx is clear.  Eyes:     Pupils: Pupils are equal, round, and reactive to light.  Neck:     Thyroid: No thyroid mass or thyromegaly.     Vascular: No carotid bruit or JVD.     Trachea: Phonation normal.  Cardiovascular:     Rate and Rhythm: Normal rate and regular rhythm.  Pulmonary:     Effort: Pulmonary effort is normal. No respiratory distress.     Breath sounds: Normal breath sounds.  Abdominal:     General: Bowel sounds are normal.     Palpations: Abdomen is soft.  Tenderness: There is no abdominal tenderness.  Musculoskeletal:        General: Normal range of motion.     Cervical back: Normal range of motion and neck supple.  Lymphadenopathy:     Cervical: No cervical adenopathy.  Skin:    General: Skin is warm and dry.  Neurological:     Mental Status: He is alert and oriented to person, place, and time.  Psychiatric:        Behavior: Behavior normal.        Thought Content: Thought content normal.        Judgment: Judgment normal.    BP (!) 143/77   Pulse (!) 59   Temp 98.2 F (36.8 C) (Temporal)   Resp 20   Ht _0  (1.803 m)   Wt 204 lb (92.5 kg)   SpO2 97%   BMI 28.45 kg/m   HGBA1C 6.3%     Assessment & Plan:   Jadyn Barge comes in today with chief complaint of Medical Management of Chronic Issues   Diagnosis and orders addressed:  1. Essential hypertension, benign Low sodium diet - CBC with Differential/Platelet - CMP14+EGFR - losartan (COZAAR) 100 MG tablet; TAKE 1 TABLET(100 MG) BY MOUTH DAILY  Dispense: 90 tablet; Refill: 1  2. Acute on chronic heart failure with preserved ejection fraction (HFpEF)/in the setting of severe mitral valve prolapse Keep follow up with cardiology  3. Paroxysmal atrial fibrillation (HCC) -  apixaban (ELIQUIS) 5 MG TABS tablet; TAKE 1 TABLET(5 MG) BY MOUTH TWICE DAILY  Dispense: 60 tablet; Refill: 6 - amiodarone (PACERONE) 200 MG tablet; Take 1 tablet (200 mg total) by mouth daily.  Dispense: 90 tablet; Refill: 3  4. Severe mitral regurgitation Again keep 6 month follow up with cardiology  5. Hyperlipidemia with target LDL less than 100 Low fat diet - Lipid panel - rosuvastatin (CRESTOR) 10 MG tablet; Take 1 tablet (10 mg total) by mouth daily.  Dispense: 90 tablet; Refill: 1  6. Type 2 diabetes mellitus with other specified complication, without long-term current use of insulin (HCC) Stricter carb counting - Bayer DCA Hb A1c Waived - metFORMIN (GLUCOPHAGE) 1000 MG tablet; Take 1 tablet (1,000 mg total) by mouth 2 (two) times daily with a meal.  Dispense: 180 tablet; Refill: 1 - sitaGLIPtin (JANUVIA) 100 MG tablet; TAKE 1 TABLET(100 MG) BY MOUTH DAILY  Dispense: 90 tablet; Refill: 1  7. Vitamin D deficiency Daily vitamin d supplement  8. Chronic midline low back pain with sciatica, sciatica laterality unspecified Talk to pian management about pain meds  9. Fibromyalgia  10. BMI 30.0-30.9,adult Discussed diet and exercise for person with BMI >25 Will recheck weight in 3-6 months    Labs pending Health Maintenance reviewed Diet and exercise encouraged  Follow up plan: 6 months   [name] Done, FNP

## 2021-10-22 LAB — CBC WITH DIFFERENTIAL/PLATELET
Basophils Absolute: 0.1 10*3/uL (ref 0.0–0.2)
Basos: 1 %
EOS (ABSOLUTE): 0.4 10*3/uL (ref 0.0–0.4)
Eos: 6 %
Hematocrit: 37.5 % (ref 37.5–51.0)
Hemoglobin: 12.1 g/dL — ABNORMAL LOW (ref 13.0–17.7)
Immature Grans (Abs): 0 10*3/uL (ref 0.0–0.1)
Immature Granulocytes: 0 %
Lymphocytes Absolute: 1.4 10*3/uL (ref 0.7–3.1)
Lymphs: 22 %
MCH: 26.1 pg — ABNORMAL LOW (ref 26.6–33.0)
MCHC: 32.3 g/dL (ref 31.5–35.7)
MCV: 81 fL (ref 79–97)
Monocytes Absolute: 0.6 10*3/uL (ref 0.1–0.9)
Monocytes: 9 %
Neutrophils Absolute: 3.8 10*3/uL (ref 1.4–7.0)
Neutrophils: 62 %
Platelets: 257 10*3/uL (ref 150–450)
RBC: 4.63 x10E6/uL (ref 4.14–5.80)
RDW: 14.4 % (ref 11.6–15.4)
WBC: 6.2 10*3/uL (ref 3.4–10.8)

## 2021-10-22 LAB — CMP14+EGFR
ALT: 59 IU/L — ABNORMAL HIGH (ref 0–44)
AST: 73 IU/L — ABNORMAL HIGH (ref 0–40)
Albumin/Globulin Ratio: 1.8 (ref 1.2–2.2)
Albumin: 4.5 g/dL (ref 3.8–4.8)
Alkaline Phosphatase: 79 IU/L (ref 44–121)
BUN/Creatinine Ratio: 22 (ref 10–24)
BUN: 15 mg/dL (ref 8–27)
Bilirubin Total: 0.2 mg/dL (ref 0.0–1.2)
CO2: 22 mmol/L (ref 20–29)
Calcium: 9.4 mg/dL (ref 8.6–10.2)
Chloride: 103 mmol/L (ref 96–106)
Creatinine, Ser: 0.67 mg/dL — ABNORMAL LOW (ref 0.76–1.27)
Globulin, Total: 2.5 g/dL (ref 1.5–4.5)
Glucose: 148 mg/dL — ABNORMAL HIGH (ref 70–99)
Potassium: 4.6 mmol/L (ref 3.5–5.2)
Sodium: 139 mmol/L (ref 134–144)
Total Protein: 7 g/dL (ref 6.0–8.5)
eGFR: 103 mL/min/{1.73_m2} (ref >59)

## 2021-10-22 LAB — LIPID PANEL
Chol/HDL Ratio: 3.4 ratio (ref 0.0–5.0)
Cholesterol, Total: 142 mg/dL (ref 100–199)
HDL: 42 mg/dL (ref >39)
LDL Chol Calc (NIH): 53 mg/dL (ref 0–99)
Triglycerides: 306 mg/dL — ABNORMAL HIGH (ref 0–149)
VLDL Cholesterol Cal: 47 mg/dL — ABNORMAL HIGH (ref 5–40)

## 2021-12-26 LAB — HM DIABETES EYE EXAM

## 2022-01-10 ENCOUNTER — Other Ambulatory Visit: Payer: Self-pay | Admitting: Nurse Practitioner

## 2022-02-03 ENCOUNTER — Encounter: Payer: Self-pay | Admitting: Cardiology

## 2022-02-03 ENCOUNTER — Other Ambulatory Visit: Payer: Self-pay

## 2022-02-03 ENCOUNTER — Ambulatory Visit (INDEPENDENT_AMBULATORY_CARE_PROVIDER_SITE_OTHER): Payer: Medicare Other | Admitting: Cardiology

## 2022-02-03 VITALS — BP 150/78 | HR 58 | Ht 71.0 in | Wt 207.2 lb

## 2022-02-03 DIAGNOSIS — I4891 Unspecified atrial fibrillation: Secondary | ICD-10-CM | POA: Diagnosis not present

## 2022-02-03 DIAGNOSIS — I059 Rheumatic mitral valve disease, unspecified: Secondary | ICD-10-CM

## 2022-02-03 DIAGNOSIS — H9203 Otalgia, bilateral: Secondary | ICD-10-CM

## 2022-02-03 DIAGNOSIS — I10 Essential (primary) hypertension: Secondary | ICD-10-CM | POA: Diagnosis not present

## 2022-02-03 DIAGNOSIS — Z9889 Other specified postprocedural states: Secondary | ICD-10-CM | POA: Diagnosis not present

## 2022-02-03 MED ORDER — SPIRONOLACTONE 25 MG PO TABS: 12.5000 mg | ORAL_TABLET | Freq: Every day | ORAL | 6 refills | Status: DC

## 2022-02-03 NOTE — Progress Notes (Signed)
? ? ? ?Clinical Summary ?Michael Valenzuela is a 66 y.o.male seen today for follow up of the following medical problems.  ?  ?  ?  ?Mitral regurgitation ?- recent admission with diagnosis of severe MR 02/2021 ?- 03/08/2021 MVR with magna ease 3m tissue valve along with LAA clipping and MAZE procedure ?- 04/2021 echo MVR mean gradient 10 mmHg.  ?  ? ?05/2021 TEE: LVEF 55-60%, MV leaflets thin without restricted motion and no obstruction. Mean grade 5 mmHg, mild MR ?- no recent SOb/DOE, no LE edema ? ?  ?2. Afib/aflutter ?- newly diagnosed during 02/2021 admission ?- s/p LAA clipping and MAZE procedure during his MVR surgery ?- has been on amio. LAVI is 44 ? ?05/17/2021 DCCV ?- no recent palpitations ?- compliant with meds, taking amio '200mg'$  daily.  ?- no bleeding on elqius. ?-LFTs slightly LFTs. TFTs normal last year, upcoming labs.  ? ?  ?  ?3. HTN ?- home bp's 130s-150s/70-80s ?- frequent urination, prefers not to be on strong diuretic.  ?  ?4. Hyperlipidemia ?10/2021 TC 142 TG 306 HDL 42 LDL 53 ?- upcoming labs with pcp ? ?5. Chronic pain ?- chronic fibromylagia ? ?6. Throat pain/Ear fullness ?- reports had seen Dr TBenjamine Molain the past ? ? ? ?Past Medical History:  ?Diagnosis Date  ? Arthritis   ? Chronic low back pain   ? [place]   ? Fibromyalgia   ? Hyperlipidemia   ? Hypertension   ? Vitamin D deficiency   ? ? ? ?No Known Allergies ? ? ?Current Outpatient Medications  ?Medication Sig Dispense Refill  ? amiodarone (PACERONE) 200 MG tablet Take 1 tablet (200 mg total) by mouth daily. 90 tablet 3  ? amLODipine (NORVASC) 10 MG tablet Take 1 tablet (10 mg total) by mouth daily. 90 tablet 3  ? apixaban (ELIQUIS) 5 MG TABS tablet TAKE 1 TABLET(5 MG) BY MOUTH TWICE DAILY 60 tablet 6  ? cholecalciferol (VITAMIN D3) 25 MCG (1000 UNIT) tablet Take 1,000 Units by mouth daily.    ? glucose blood test strip TrueTrack strips. Test BS qd. Dx E11.9 100 each 3  ? losartan (COZAAR) 100 MG tablet TAKE 1  TABLET(100 MG) BY MOUTH DAILY 90 tablet 1  ? Menthol-Camphor (ICY HOT ADVANCED PAIN RELIEF) 16-11 % CREA Apply 1 application topically daily as needed (pain).    ? metFORMIN (GLUCOPHAGE) 1000 MG tablet Take 1 tablet (1,000 mg total) by mouth 2 (two) times daily with a meal. 180 tablet 1  ? Multiple Vitamins-Minerals (MULTIVITAMIN WITH MINERALS) tablet Take 1 tablet by mouth daily.    ? oxyCODONE (ROXICODONE) 15 MG immediate release tablet Take 15 mg by mouth every 6 (six) hours as needed for pain.    ? rosuvastatin (CRESTOR) 10 MG tablet Take 1 tablet (10 mg total) by mouth daily. 90 tablet 1  ? sitaGLIPtin (JANUVIA) 100 MG tablet TAKE 1 TABLET(100 MG) BY MOUTH DAILY 90 tablet 1  ? Turmeric Curcumin 500 MG CAPS Take 500 mg by mouth daily.    ? ?No current facility-administered medications for this visit.  ? ? ? ?Past Surgical History:  ?Procedure Laterality Date  ? CARDIOVERSION N/A 05/17/2021  ? Procedure: CARDIOVERSION;  Surgeon: RFay Records MD;  Location: [facility];  Service: Cardiovascular;  Laterality: N/A;  ? HERNIA REPAIR    ? MAZE N/A 03/08/2021  ? Procedure: MAZE;  Surgeon: AWonda Olds MD;  [facility] [facility]  Service: Open Heart Surgery;  Laterality: N/A;  ? MITRAL VALVE REPAIR N/A 03/08/2021  ? Procedure: MITRAL VALVE REPAIR USING  PHYSIO RING SIZE 28 (EXPLANTED) THEN MITRAL VALVE REPLACEMENT (MVR) WITH MAGNA EASE MITRAL VALVE SIZE 25MM  ON PUMP, LEFT ATRIAL APPENDAGE CLIPPING USING A SIZE 50 ATRIAL CLIP;  Surgeon: Wonda Olds, MD;  Location: [place];  Service: Open Heart Surgery;  Laterality: N/A;  ? RIGHT/LEFT HEART CATH AND CORONARY ANGIOGRAPHY N/A 03/05/2021  ? Procedure: RIGHT/LEFT HEART CATH AND CORONARY ANGIOGRAPHY;  Surgeon: Leonie Man, MD;  Location: [place] [facility];  Service: Cardiovascular;  Laterality: N/A;  ? TEE WITHOUT CARDIOVERSION N/A 03/05/2021  ? Procedure: TRANSESOPHAGEAL ECHOCARDIOGRAM (TEE);  Surgeon: Donato Heinz, MD;  Location: [place];  Service:  Cardiovascular;  Laterality: N/A;  ? TEE WITHOUT CARDIOVERSION N/A 03/08/2021  ? Procedure: TRANSESOPHAGEAL ECHOCARDIOGRAM (TEE);  Surgeon: Wonda Olds, MD;  Location: [place];  Service: Open Heart Surgery;  Laterality: N/A;  ? TEE WITHOUT CARDIOVERSION N/A 05/17/2021  ? Procedure: TRANSESOPHAGEAL ECHOCARDIOGRAM (TEE);  Surgeon: Fay Records, MD;  Location: [facility];  Service: Cardiovascular;  Laterality: N/A;  ? ? ? ?No Known Allergies ? ? ? ?Family History  ?Problem Relation Age of Onset  ? Heart disease Mother   ? Diabetes Mother   ? Cancer Mother   ? Heart disease Father   ? Diabetes Son 15  ?     type 1  ? Diabetes Paternal Uncle   ? Heart attack Brother   ? Hypertension Son   ? ? ? ?Social History ?Michael Valenzuela reports that he quit smoking about 2 years ago. His smoking use included cigarettes. He has a 10.00 pack-year smoking history. He has never used smokeless tobacco. ?Michael Valenzuela reports current alcohol use. ? ? ?Review of Systems ?CONSTITUTIONAL: No weight loss, fever, chills, weakness or fatigue.  ?HEENT: Eyes: No visual loss, blurred vision, double vision or yellow sclerae.No hearing loss, sneezing, congestion, runny nose or sore throat.  ?SKIN: No rash or itching.  ?CARDIOVASCULAR: per hpi ?RESPIRATORY: No shortness of breath, cough or sputum.  ?GASTROINTESTINAL: No anorexia, nausea, vomiting or diarrhea. No abdominal pain or blood.  ?GENITOURINARY: No burning on urination, no polyuria ?NEUROLOGICAL: No headache, dizziness, syncope, paralysis, ataxia, numbness or tingling in the extremities. No change in bowel or bladder control.  ?MUSCULOSKELETAL: No muscle, back pain, joint pain or stiffness.  ?LYMPHATICS: No enlarged nodes. No history of splenectomy.  ?PSYCHIATRIC: No history of depression or anxiety.  ?ENDOCRINOLOGIC: No reports of sweating, cold or heat intolerance. No polyuria or polydipsia.  ?. ? ? ?Physical Examination ?Today's Vitals  ? 02/03/22 1247  ?BP: (!) 150/78  ?Pulse: (!) 58  ?SpO2: 96%   ?Weight: 207 lb 3.2 oz (94 kg)  ?Height: '5\' 11"'$  (1.803 m)  ? ?Body mass index is 28.9 kg/m?. ? ?Gen: resting comfortably, no acute distress ?HEENT: no scleral icterus, pupils equal round and reactive, no palptable cervical adenopathy,  ?CV: RRR, no m/r/g no jvd ?Resp: Clear to auscultation bilaterally ?GI: abdomen is soft, non-tender, non-distended, normal bowel sounds, no hepatosplenomegaly ?MSK: extremities are warm, no edema.  ?Skin: warm, no rash ?Neuro:  no focal deficits ?Psych: appropriate affect ? ? ?Diagnostic Studies ? ?TEE 03/05/21: ?IMPRESSIONS  ? 1. Left ventricular ejection fraction, by estimation, is 60 to 65%. The  ?left ventricle has normal function.  ? 2. Right ventricular systolic function is normal. The right ventricular  ?size is mildly enlarged.  ? 3. Left atrial size was severely  dilated. No left atrial/left atrial  ?appendage thrombus was detected.  ? 4. Right atrial size was mildly dilated.  ? 5. The aortic valve is tricuspid. Aortic valve regurgitation is not  ?visualized.  ? 6. The mitral valve is abnormal. Flail P2. Severe mitral valve  ?regurgitation. MR severe by both 2D ERO and 3D VCA. Systolic flow reversal  ?in pulmonary veins, consistent with severe MR. ?  ?Cath 03/05/21: ?The left ventricular systolic function is normal. The left ventricular ejection fraction is 55-65% by visual estimate. ?LV end diastolic pressure is mildly elevated. ?There is moderate (3+) mitral regurgitation. ?Hemodynamic findings consistent with mild pulmonary hypertension. ?Angiographically normal coronary arteries ?  ?SUMMARY ?Angiographically Normal Coronary Arteries ?Mild Pulmonary Hypertension with mean PAP 30 mmHg, PCWP 5 mmHg with LVEDP 15 mmHg. ?Mild-moderately reduced Cardiac Output-Cardiac Index (Fick) 4.44-2.12. ? ? ? ?05/2021 TEE ?IMPRESSIONS  ? ? ? 1. Left ventricular ejection fraction, by estimation, is 55 to 60%. The  ?left ventricle has normal function.  ? 2. Right ventricular systolic function  is normal. The right ventricular  ?size is normal.  ? 3. LA appendage has been clipped surgically No flow seen.  ? 4. 25 mm Magna ease Mitral valve present (03/08/21) Valve is well seated.  ?Leaflets thin without signific

## 2022-02-03 NOTE — Patient Instructions (Addendum)
Medication Instructions:  ?Begin Aldactone 12.'5mg'$  daily ?Continue all other medications.    ? ?Labwork: ?none ? ?Testing/Procedures: ?none ? ?Follow-Up: ?6 months  ? ?Any Other Special Instructions Will Be Listed Below (If Applicable). ?You have been referred to:  ENT  ? ?If you need a refill on your cardiac medications before your next appointment, please call your pharmacy. ? ?

## 2022-02-11 NOTE — Patient Instructions (Signed)
[name] , ?Thank you for taking time to come for your Medicare Wellness Visit. I appreciate your ongoing commitment to your health goals. Please review the following plan we discussed and let me know if I can assist you in the future.  ? ?Screening recommendations/referrals: ?Colonoscopy: Done 03/17/2012 - Repeat in 10 years *this year! ?Recommended yearly ophthalmology/optometry visit for glaucoma screening and checkup ?Recommended yearly dental visit for hygiene and checkup ? ?Vaccinations: ?Influenza vaccine: Done 10/11/2021 - Repeat annually ?Pneumococcal vaccine: Done  12/31/2015 & 01/06/2017  ?Tdap vaccine: Done 03/19/2015 - Repeat in 10 years ?Shingles vaccine: Zostavax done 2016; Shingrix #1 done 07/22/2021 - due for #2 at next visit**   ?Covid-19: Done  02/23/2020, 03/15/2020, & 11/09/2020 ? ?Advanced directives: Advance directive discussed with you today. Even though you declined this today, please call our office should you change your mind, and we can give you the proper paperwork for you to fill out.  ? ?Conditions/risks identified: Aim for 30 minutes of exercise or brisk walking, 6-8 glasses of water, and 5 servings of fruits and vegetables each day.  ? ?Next appointment: Follow up in one year for your annual wellness visit.  ? ?Preventive Care 70 Years and Older, Male ? ?Preventive care refers to lifestyle choices and visits with your health care provider that can promote health and wellness. ?What does preventive care include? ?A yearly physical exam. This is also called an annual well check. ?Dental exams once or twice a year. ?Routine eye exams. Ask your health care provider how often you should have your eyes checked. ?Personal lifestyle choices, including: ?Daily care of your teeth and gums. ?Regular physical activity. ?Eating a healthy diet. ?Avoiding tobacco and drug use. ?Limiting alcohol use. ?Practicing safe sex. ?Taking low doses of aspirin every day. ?Taking vitamin and mineral supplements as  recommended by your health care provider. ?What happens during an annual well check? ?The services and screenings done by your health care provider during your annual well check will depend on your age, overall health, lifestyle risk factors, and family history of disease. ?Counseling  ?Your health care provider may ask you questions about your: ?Alcohol use. ?Tobacco use. ?Drug use. ?Emotional well-being. ?Home and relationship well-being. ?Sexual activity. ?Eating habits. ?History of falls. ?Memory and ability to understand (cognition). ?Work and work Statistician. ?Screening  ?You may have the following tests or measurements: ?Height, weight, and BMI. ?Blood pressure. ?Lipid and cholesterol levels. These may be checked every 5 years, or more frequently if you are over 16 years old. ?Skin check. ?Lung cancer screening. You may have this screening every year starting at age 85 if you have a 30-pack-year history of smoking and currently smoke or have quit within the past 15 years. ?Fecal occult blood test (FOBT) of the stool. You may have this test every year starting at age 60. ?Flexible sigmoidoscopy or colonoscopy. You may have a sigmoidoscopy every 5 years or a colonoscopy every 10 years starting at age 51. ?Prostate cancer screening. Recommendations will vary depending on your family history and other risks. ?Hepatitis C blood test. ?Hepatitis B blood test. ?Sexually transmitted disease (STD) testing. ?Diabetes screening. This is done by checking your blood sugar (glucose) after you have not eaten for a while (fasting). You may have this done every 1-3 years. ?Abdominal aortic aneurysm (AAA) screening. You may need this if you are a current or former smoker. ?Osteoporosis. You may be screened starting at age 11 if you are at high risk. ?Talk with  your health care provider about your test results, treatment options, and if necessary, the need for more tests. ?Vaccines  ?Your health care provider may recommend  certain vaccines, such as: ?Influenza vaccine. This is recommended every year. ?Tetanus, diphtheria, and acellular pertussis (Tdap, Td) vaccine. You may need a Td booster every 10 years. ?Zoster vaccine. You may need this after age 66. ?Pneumococcal 13-valent conjugate (PCV13) vaccine. One dose is recommended after age 57. ?Pneumococcal polysaccharide (PPSV23) vaccine. One dose is recommended after age 20. ?Talk to your health care provider about which screenings and vaccines you need and how often you need them. ?This information is not intended to replace advice given to you by your health care provider. Make sure you discuss any questions you have with your health care provider. ?Document Released: 11/23/2015 Document Revised: 07/16/2016 Document Reviewed: 08/28/2015 ?Elsevier Interactive Patient Education ? 2017 [place]. ? ?Fall Prevention in the Home ?Falls can cause injuries. They can happen to people of all ages. There are many things you can do to make your home safe and to help prevent falls. ?What can I do on the outside of my home? ?Regularly fix the edges of walkways and driveways and fix any cracks. ?Remove anything that might make you trip as you walk through a door, such as a raised step or threshold. ?Trim any bushes or trees on the path to your home. ?Use bright outdoor lighting. ?Clear any walking paths of anything that might make someone trip, such as rocks or tools. ?Regularly check to see if handrails are loose or broken. Make sure that both sides of any steps have handrails. ?Any raised decks and porches should have guardrails on the edges. ?Have any leaves, snow, or ice cleared regularly. ?Use sand or salt on walking paths during winter. ?Clean up any spills in your garage right away. This includes oil or grease spills. ?What can I do in the bathroom? ?Use night lights. ?Install grab bars by the toilet and in the tub and shower. Do not use towel bars as grab bars. ?Use non-skid mats or  decals in the tub or shower. ?If you need to sit down in the shower, use a plastic, non-slip stool. ?Keep the floor dry. Clean up any water that spills on the floor as soon as it happens. ?Remove soap buildup in the tub or shower regularly. ?Attach bath mats securely with double-sided non-slip rug tape. ?Do not have throw rugs and other things on the floor that can make you trip. ?What can I do in the bedroom? ?Use night lights. ?Make sure that you have a light by your bed that is easy to reach. ?Do not use any sheets or blankets that are too big for your bed. They should not hang down onto the floor. ?Have a firm chair that has side arms. You can use this for support while you get dressed. ?Do not have throw rugs and other things on the floor that can make you trip. ?What can I do in the kitchen? ?Clean up any spills right away. ?Avoid walking on wet floors. ?Keep items that you use a lot in easy-to-reach places. ?If you need to reach something above you, use a strong step stool that has a grab bar. ?Keep electrical cords out of the way. ?Do not use floor polish or wax that makes floors slippery. If you must use wax, use non-skid floor wax. ?Do not have throw rugs and other things on the floor that can make you trip. ?  What can I do with my stairs? ?Do not leave any items on the stairs. ?Make sure that there are handrails on both sides of the stairs and use them. Fix handrails that are broken or loose. Make sure that handrails are as long as the stairways. ?Check any carpeting to make sure that it is firmly attached to the stairs. Fix any carpet that is loose or worn. ?Avoid having throw rugs at the top or bottom of the stairs. If you do have throw rugs, attach them to the floor with carpet tape. ?Make sure that you have a light switch at the top of the stairs and the bottom of the stairs. If you do not have them, ask someone to add them for you. ?What else can I do to help prevent falls? ?Wear shoes that: ?Do not  have high heels. ?Have rubber bottoms. ?Are comfortable and fit you well. ?Are closed at the toe. Do not wear sandals. ?If you use a stepladder: ?Make sure that it is fully opened. Do not climb a closed stepladde

## 2022-02-12 ENCOUNTER — Ambulatory Visit (INDEPENDENT_AMBULATORY_CARE_PROVIDER_SITE_OTHER): Payer: Medicare Other

## 2022-02-12 VITALS — BP 143/85 | Wt 205.0 lb

## 2022-02-12 DIAGNOSIS — Z0000 Encounter for general adult medical examination without abnormal findings: Secondary | ICD-10-CM | POA: Diagnosis not present

## 2022-02-12 NOTE — Progress Notes (Signed)
? ?Subjective:  ? Michael Valenzuela is a 66 y.o. male who presents for Medicare Annual/Subsequent preventive examination. ? ?Virtual Visit via Telephone Note ? ?I connected with  Michael Valenzuela on 02/12/22 at  9:00 AM EDT by telephone and verified that I am speaking with the correct person using two identifiers. ? ?Location: ?Patient: Home ?Provider: [facility] ?Persons participating in the virtual visit: patient/Nurse Health Advisor ?  ?I discussed the limitations, risks, security and privacy concerns of performing an evaluation and management service by telephone and the availability of in person appointments. The patient expressed understanding and agreed to proceed. ? ?Interactive audio and video telecommunications were attempted between this nurse and patient, however failed, due to patient having technical difficulties OR patient did not have access to video capability.  We continued and completed visit with audio only. ? ?Some vital signs may be absent or patient reported.  ? ?Michael Berg Dionne Ano, LPN  ? ?Review of Systems    ? ?Cardiac Risk Factors include: advanced age (>4mn, >>81women);diabetes mellitus;dyslipidemia;hypertension;male gender;sedentary lifestyle;smoking/ tobacco exposure;Other (see comment), Risk factor comments: CHF, A.Fib ? ?   ?Objective:  ?  ?Today's Vitals  ? 02/12/22 0933  ?BP: (!) 143/85  ?Weight: 205 lb (93 kg)  ?PainSc: 5   ? ?Body mass index is 28.59 kg/m?. ? ? ?  02/12/2022  ?  9:45 AM 05/17/2021  ? 10:59 AM 05/15/2021  ?  1:33 PM 03/15/2021  ?  7:41 AM 03/08/2021  ?  4:00 PM 03/01/2021  ?  6:44 PM 03/01/2021  ?  1:11 PM  ?Advanced Directives  ?Does Patient Have a Medical Advance Directive? No No No No No  No  ?Would patient like information on creating a medical advance directive? No - Patient declined No - Patient declined No - Patient declined  No - Patient declined No - Patient declined   ? ? ?Current Medications (verified) ?Outpatient Encounter Medications as of 02/12/2022  ?Medication Sig  ? amiodarone  (PACERONE) 200 MG tablet Take 1 tablet (200 mg total) by mouth daily.  ? amLODipine (NORVASC) 10 MG tablet Take 1 tablet (10 mg total) by mouth daily.  ? apixaban (ELIQUIS) 5 MG TABS tablet TAKE 1 TABLET(5 MG) BY MOUTH TWICE DAILY  ? cholecalciferol (VITAMIN D3) 25 MCG (1000 UNIT) tablet Take 1,000 Units by mouth daily.  ? glucose blood test strip TrueTrack strips. Test BS qd. Dx E11.9  ? hydrOXYzine (ATARAX) 10 MG tablet Take 10 mg by mouth at bedtime as needed.  ? losartan (COZAAR) 100 MG tablet TAKE 1 TABLET(100 MG) BY MOUTH DAILY  ? Menthol-Camphor (ICY HOT ADVANCED PAIN RELIEF) 16-11 % CREA Apply 1 application topically daily as needed (pain).  ? metFORMIN (GLUCOPHAGE) 1000 MG tablet Take 1 tablet (1,000 mg total) by mouth 2 (two) times daily with a meal.  ? Multiple Vitamins-Minerals (MULTIVITAMIN WITH MINERALS) tablet Take 1 tablet by mouth daily.  ? oxyCODONE (ROXICODONE) 15 MG immediate release tablet Take 15 mg by mouth every 6 (six) hours as needed for pain.  ? rosuvastatin (CRESTOR) 10 MG tablet Take 1 tablet (10 mg total) by mouth daily.  ? sitaGLIPtin (JANUVIA) 100 MG tablet TAKE 1 TABLET(100 MG) BY MOUTH DAILY  ? spironolactone (ALDACTONE) 25 MG tablet Take 0.5 tablets (12.5 mg total) by mouth daily.  ? Turmeric Curcumin 500 MG CAPS Take 500 mg by mouth daily.  ? ?No facility-administered encounter medications on file as of 02/12/2022.  ? ? ?Allergies (verified) ?Patient has no known allergies.  ? ?  History: ?Past Medical History:  ?Diagnosis Date  ? Arthritis   ? Chronic low back pain   ? Diabetes mellitus without complication ([place])   ? Fibromyalgia   ? Hyperlipidemia   ? Hypertension   ? Vitamin D deficiency   ? ?Past Surgical History:  ?Procedure Laterality Date  ? CARDIOVERSION N/A 05/17/2021  ? Procedure: CARDIOVERSION;  Surgeon: Fay Records, MD;  Location: [facility];  Service: Cardiovascular;  Laterality: N/A;  ? HERNIA REPAIR    ? MAZE N/A 03/08/2021  ? Procedure: MAZE;  Surgeon: Wonda Olds,  MD;  Location: [place];  Service: Open Heart Surgery;  Laterality: N/A;  ? MITRAL VALVE REPAIR N/A 03/08/2021  ? Procedure: MITRAL VALVE REPAIR USING  PHYSIO RING SIZE 28 (EXPLANTED) THEN MITRAL VALVE REPLACEMENT (MVR) WITH MAGNA EASE MITRAL VALVE SIZE 25MM  ON PUMP, LEFT ATRIAL APPENDAGE CLIPPING USING A SIZE 50 ATRIAL CLIP;  Surgeon: Wonda Olds, MD;  Location: [place];  Service: Open Heart Surgery;  Laterality: N/A;  ? RIGHT/LEFT HEART CATH AND CORONARY ANGIOGRAPHY N/A 03/05/2021  ? Procedure: RIGHT/LEFT HEART CATH AND CORONARY ANGIOGRAPHY;  Surgeon: Leonie Man, MD;  Location: [facility];  Service: Cardiovascular;  Laterality: N/A;  ? TEE WITHOUT CARDIOVERSION N/A 03/05/2021  ? Procedure: TRANSESOPHAGEAL ECHOCARDIOGRAM (TEE);  Surgeon: Donato Heinz, MD;  Location: [place];  Service: Cardiovascular;  Laterality: N/A;  ? TEE WITHOUT CARDIOVERSION N/A 03/08/2021  ? Procedure: TRANSESOPHAGEAL ECHOCARDIOGRAM (TEE);  Surgeon: Wonda Olds, MD;  Location: [place];  Service: Open Heart Surgery;  Laterality: N/A;  ? TEE WITHOUT CARDIOVERSION N/A 05/17/2021  ? Procedure: TRANSESOPHAGEAL ECHOCARDIOGRAM (TEE);  Surgeon: Fay Records, MD;  Location: [facility];  Service: Cardiovascular;  Laterality: N/A;  ? ?Family History  ?Problem Relation Age of Onset  ? Heart disease Mother   ? Diabetes Mother   ? Cancer Mother   ? Heart disease Father   ? Diabetes Son 15  ?     type 1  ? Diabetes Paternal Uncle   ? Heart attack Brother   ? Hypertension Son   ? ?Social History  ? ?Socioeconomic History  ? Marital status: Married  ?  Spouse name: [name]  ? Number of children: 2  ? Years of education: 59  ? Highest education level: Some college, no degree  ?Occupational History  ? Occupation: Retired  ?  Employer: [employer]  ? Occupation: disabled  ?Tobacco Use  ? Smoking status: Former  ?  Packs/day: 0.50  ?  Years: 20.00  ?  Pack years: 10.00  ?  Types: Cigarettes  ?  Quit date: 02/09/2019  ?  Years since  quitting: 3.0  ? Smokeless tobacco: Never  ?Vaping Use  ? Vaping Use: Never used  ?Substance and Sexual Activity  ? Alcohol use: Yes  ?  Comment: occasional  ? Drug use: No  ? Sexual activity: Not Currently  ?Other Topics Concern  ? Not on file  ?Social History Narrative  ? Patient has fibromyalgia and has significant pain with that. He has two adult children and is married. He has one grandchild.  ? Both sons live next door   ? ?Social Determinants of Health  ? ?Financial Resource Strain: Low Risk   ? Difficulty of Paying Living Expenses: Not hard at all  ?Food Insecurity: No Food Insecurity  ? Worried About Charity fundraiser in the Last Year: Never true  ? Ran Out of Food in the  Last Year: Never true  ?Transportation Needs: No Transportation Needs  ? Lack of Transportation (Medical): No  ? Lack of Transportation (Non-Medical): No  ?Physical Activity: Insufficiently Active  ? Days of Exercise per Week: 3 days  ? Minutes of Exercise per Session: 40 min  ?Stress: No Stress Concern Present  ? Feeling of Stress : Not at all  ?Social Connections: Moderately Isolated  ? Frequency of Communication with Friends and Family: More than three times a week  ? Frequency of Social Gatherings with Friends and Family: More than three times a week  ? Attends Religious Services: Never  ? Active Member of Clubs or Organizations: No  ? Attends Archivist Meetings: Never  ? Marital Status: Married  ? ? ?Tobacco Counseling ?Counseling given: Not Answered ? ? ?Clinical Intake: ? ?Pre-visit preparation completed: Yes ? ?Pain : 0-10 ?Pain Score: 5  ?Pain Type: Chronic pain ?Pain Location: Generalized ?Pain Descriptors / Indicators: Aching, Discomfort ?Pain Onset: More than a month ago ?Pain Frequency: Intermittent ? ?  ? ?BMI - recorded: 28.59 ?Nutritional Status: BMI 25 -29 Overweight ?Nutritional Risks: None ?Diabetes: Yes ?CBG done?: No ?Did pt. bring in CBG monitor from home?: No ? ?How often do you need to have someone help  you when you read instructions, pamphlets, or other written materials from your doctor or pharmacy?: 1 - Never ? ?Diabetic? Nutrition Risk Assessment: ? ?Has the patient had any N/V/D within the last 2 months?

## 2022-02-21 IMAGING — CT CT ANGIO CHEST
2 of 6 series · 18 of 46 positions shown · IV contrast (Omnipaque or Isovue)
Comparison: Radiograph earlier today.

CLINICAL DATA: PE suspected, high prob

Shortness of breath.
EXAM:
CT ANGIOGRAPHY CHEST WITH CONTRAST
TECHNIQUE: Multidetector CT imaging of the chest was performed using the
standard protocol during bolus administration of intravenous
contrast. Multiplanar CT image reconstructions and MIPs were
obtained to evaluate the vascular anatomy.
CONTRAST:  100mL OMNIPAQUE IOHEXOL 350 MG/ML SOLN

[Series 5: pe axial thins · axial · 0.82mm/px · z∈[+208,+489]mm · 15 of 383 slices shown]
[im 16/383  lung]
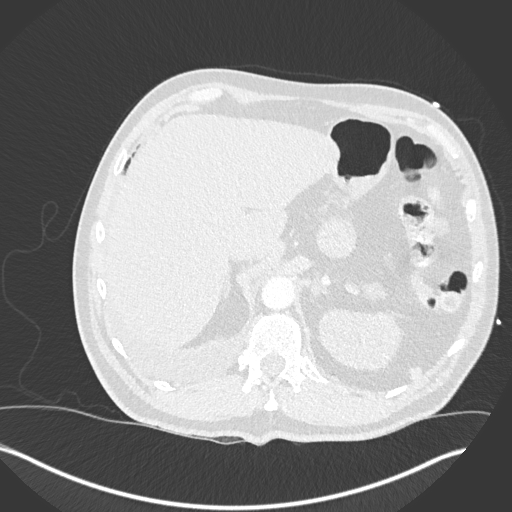
[im 48/383  soft-tissue]
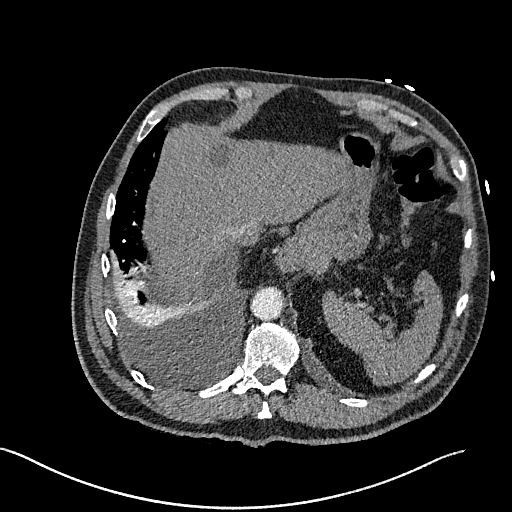
[im 64/383  lung]
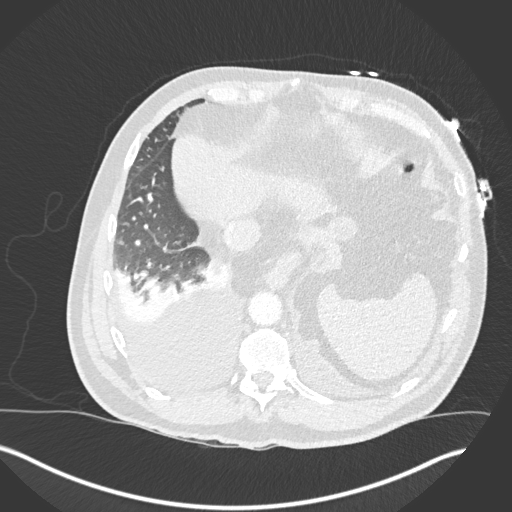
[im 96/383  soft-tissue]
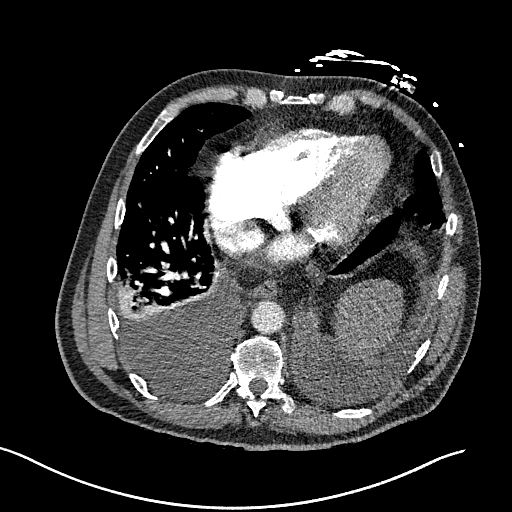
[im 112/383  lung]
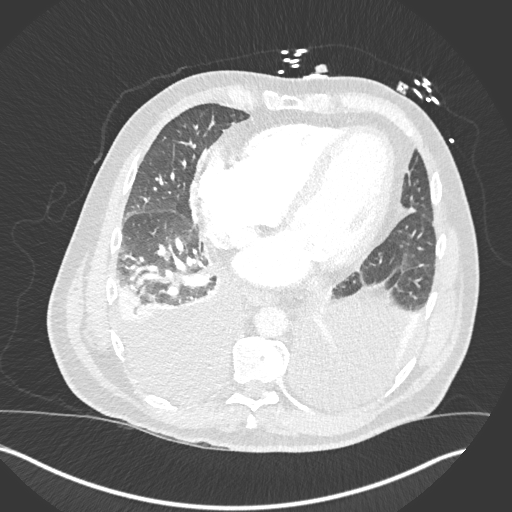
[im 144/383  soft-tissue]
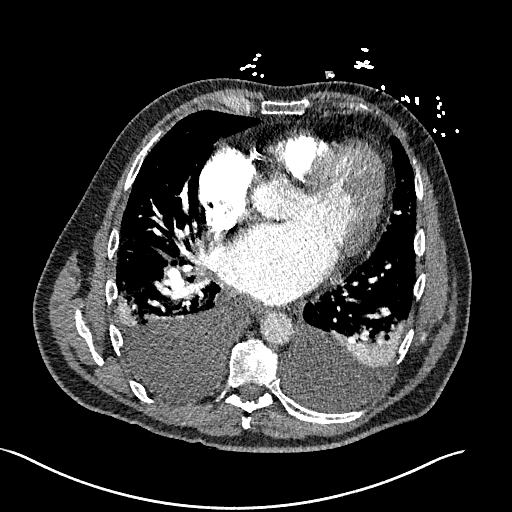
[im 160/383  lung]
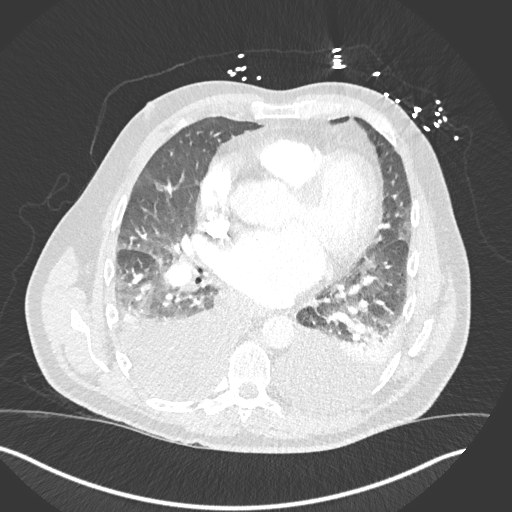
[im 192/383  soft-tissue]
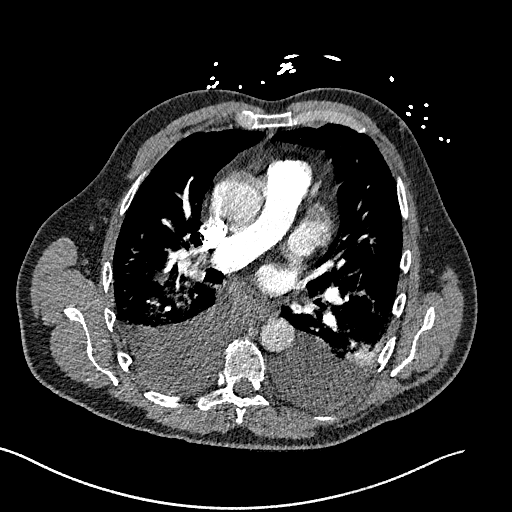
[im 223/383  lung]
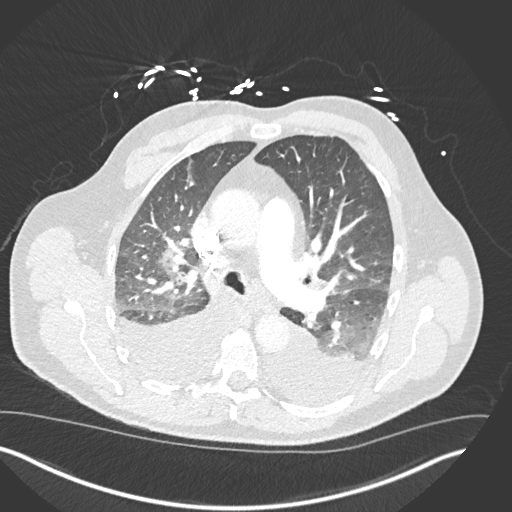
[im 239/383  soft-tissue]
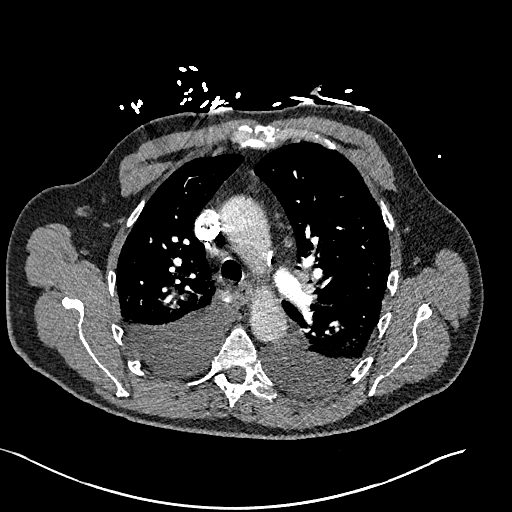
[im 271/383  lung]
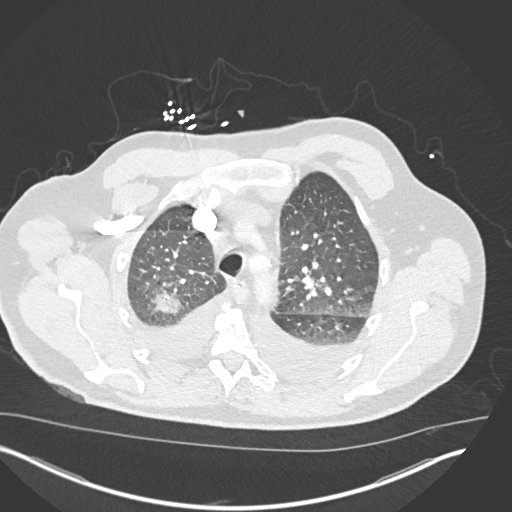
[im 287/383  soft-tissue]
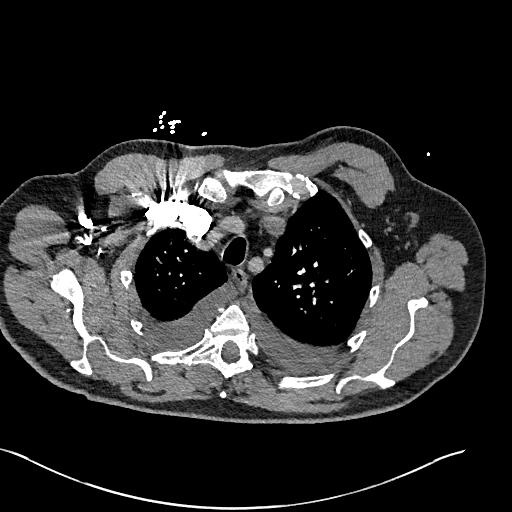
[im 319/383  lung]
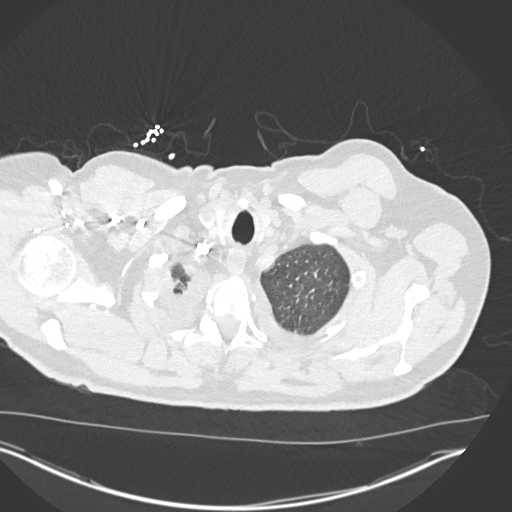
[im 335/383  soft-tissue]
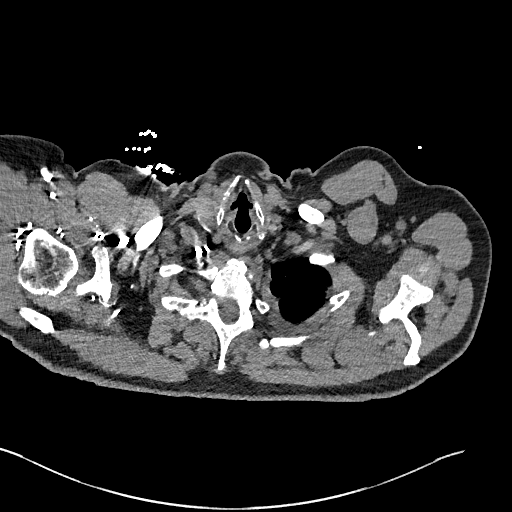
[im 367/383  lung]
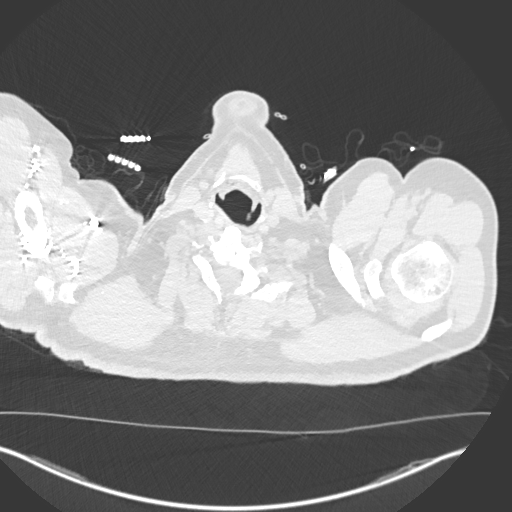

[Series 7: cor soft · coronal · 0.64mm/px · 3 of 151 slices shown]
[im 38/151  soft-tissue]
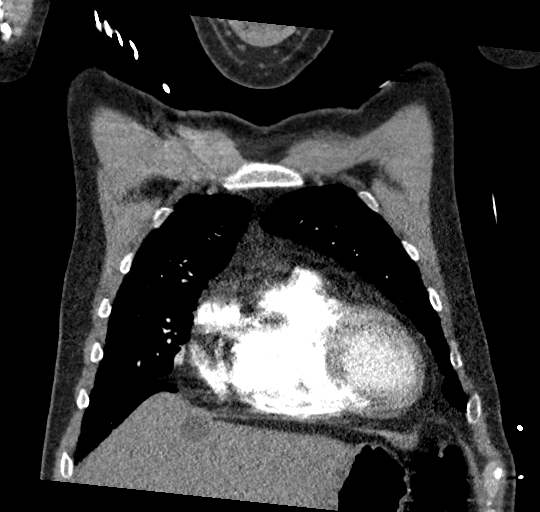
[im 76/151  soft-tissue]
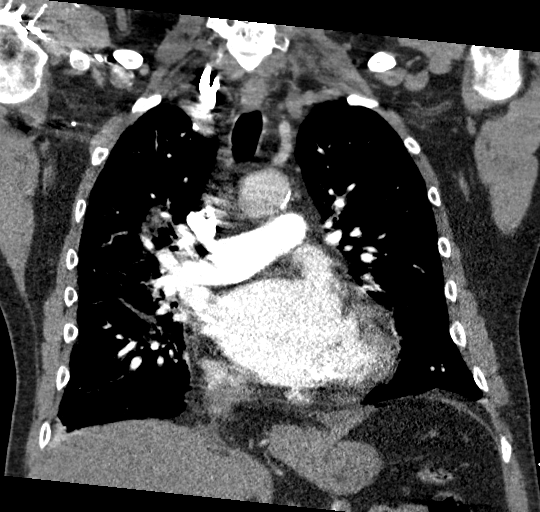
[im 113/151  soft-tissue]
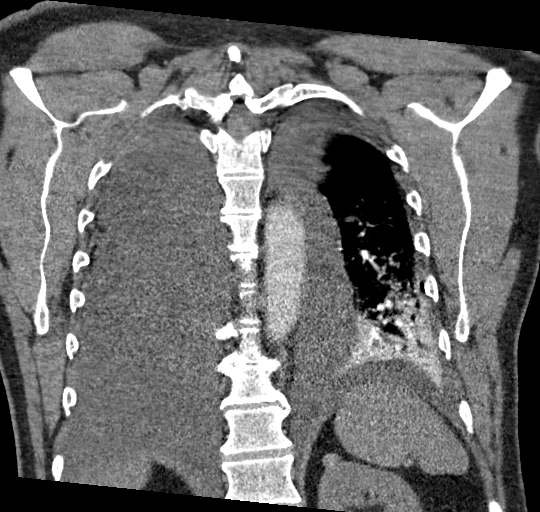

[18 of 46 positions shown; findings below may reference images not displayed]

FINDINGS: Cardiovascular: There are no filling defects within the pulmonary
arteries to suggest pulmonary embolus. Mild aortic atherosclerosis
without aneurysm or dissection. Mild cardiomegaly. Coronary artery
and mitral annulus calcifications. No pericardial effusion.

Mediastinum/Nodes: Mm anterior paratracheal node, series 4, image
45. Minimal soft tissue density at the right hilum likely small
lymph nodes. Small hiatal hernia. No visualized thyroid nodule.

Lungs/Pleura: Moderate bilateral pleural effusions with adjacent
compressive atelectasis. Scattered smooth septal thickening
consistent with pulmonary edema. There are peribronchovascular
ground-glass opacities in the right perihilar lung and right upper
lobe. Associated right greater than left bronchial wall thickening.

Upper Abdomen: Water density lesion in the left lobe of the liver is
typically cyst. No acute findings in the included upper abdomen.

Musculoskeletal: Mild thoracic spondylosis. There are no acute or
suspicious osseous abnormalities.

Review of the MIP images confirms the above findings.
IMPRESSION: 1. No pulmonary embolus.
2. Moderate bilateral pleural effusions with adjacent compressive
atelectasis. Smooth septal thickening consistent with pulmonary
edema.
3. Peribronchial ground-glass opacities in the right perihilar lung
and right upper lobe. Differential considerations include asymmetric
alveolar edema, infectious or inflammatory etiology. Consider
follow-up after resolution of symptoms to assess for resolution.
4. Bronchial wall thickening, can be seen with bronchitis or
congestive.
5. Mild cardiomegaly. Coronary artery calcifications.
6. Small hiatal hernia.

Aortic Atherosclerosis ([id number]).

## 2022-02-23 ENCOUNTER — Encounter: Payer: Self-pay | Admitting: Cardiology

## 2022-02-25 IMAGING — DX DG ORTHOPANTOGRAM /PANORAMIC
1 series · 1 of 1 positions shown · non-contrast
Comparison: None.

CLINICAL DATA: Severe mitral regurgitation.  No oral pain.

EXAM:
ORTHOPANTOGRAM/PANORAMIC

[view not recorded]
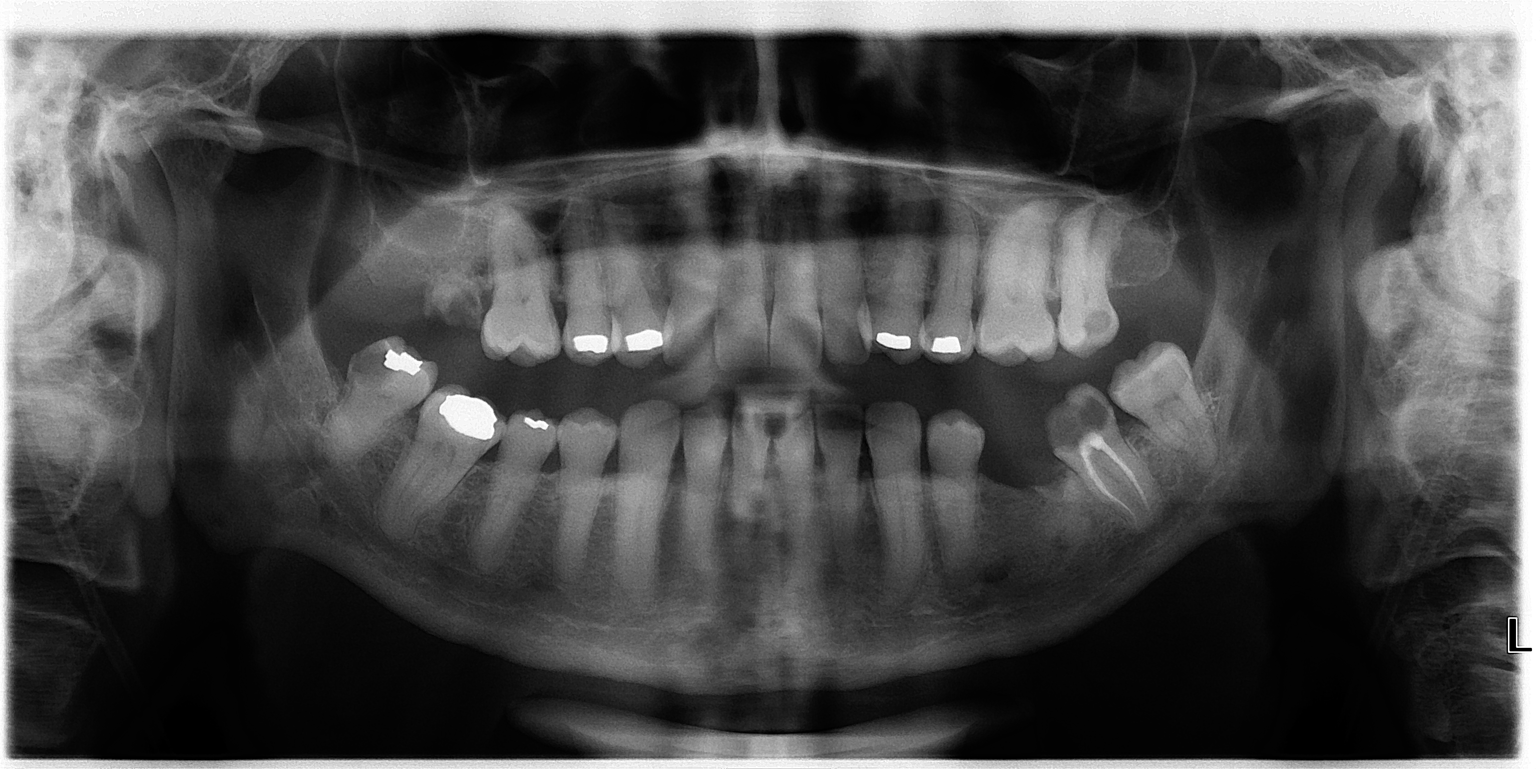

[1 of 1 positions shown; findings below may reference images not displayed]

FINDINGS: Previous extractions of the upper and lower third molars
bilaterally, partial resection of the right upper second molar, and
resection of the left lower second bicuspid.

Dental caries demonstrated in the left upper second molar, the left
lower first and second molars, and the right lower second molar.

Reconstructions demonstrated in the right upper cuspid and first
bicuspid, the left upper cuspid and first bicuspid, the right lower
second bicuspid and first and second molars, and changes consistent
with root canal in the left lower first molar.

Minimal bone resorption suggested around the right lower second
molar. No definite periapical lucencies.

No acute fracture or dislocation of the visualized mandible.
IMPRESSION: Multiple dental caries and prior tooth extractions. No definite
periapical lucencies. No acute fracture or dislocation of the
mandible.

## 2022-02-28 IMAGING — DX DG CHEST 1V PORT
1 series · 1 of 1 positions shown · non-contrast
Comparison: 03/03/2021

CLINICAL DATA: Cardiac surgery

EXAM:
PORTABLE CHEST 1 VIEW

[chest]
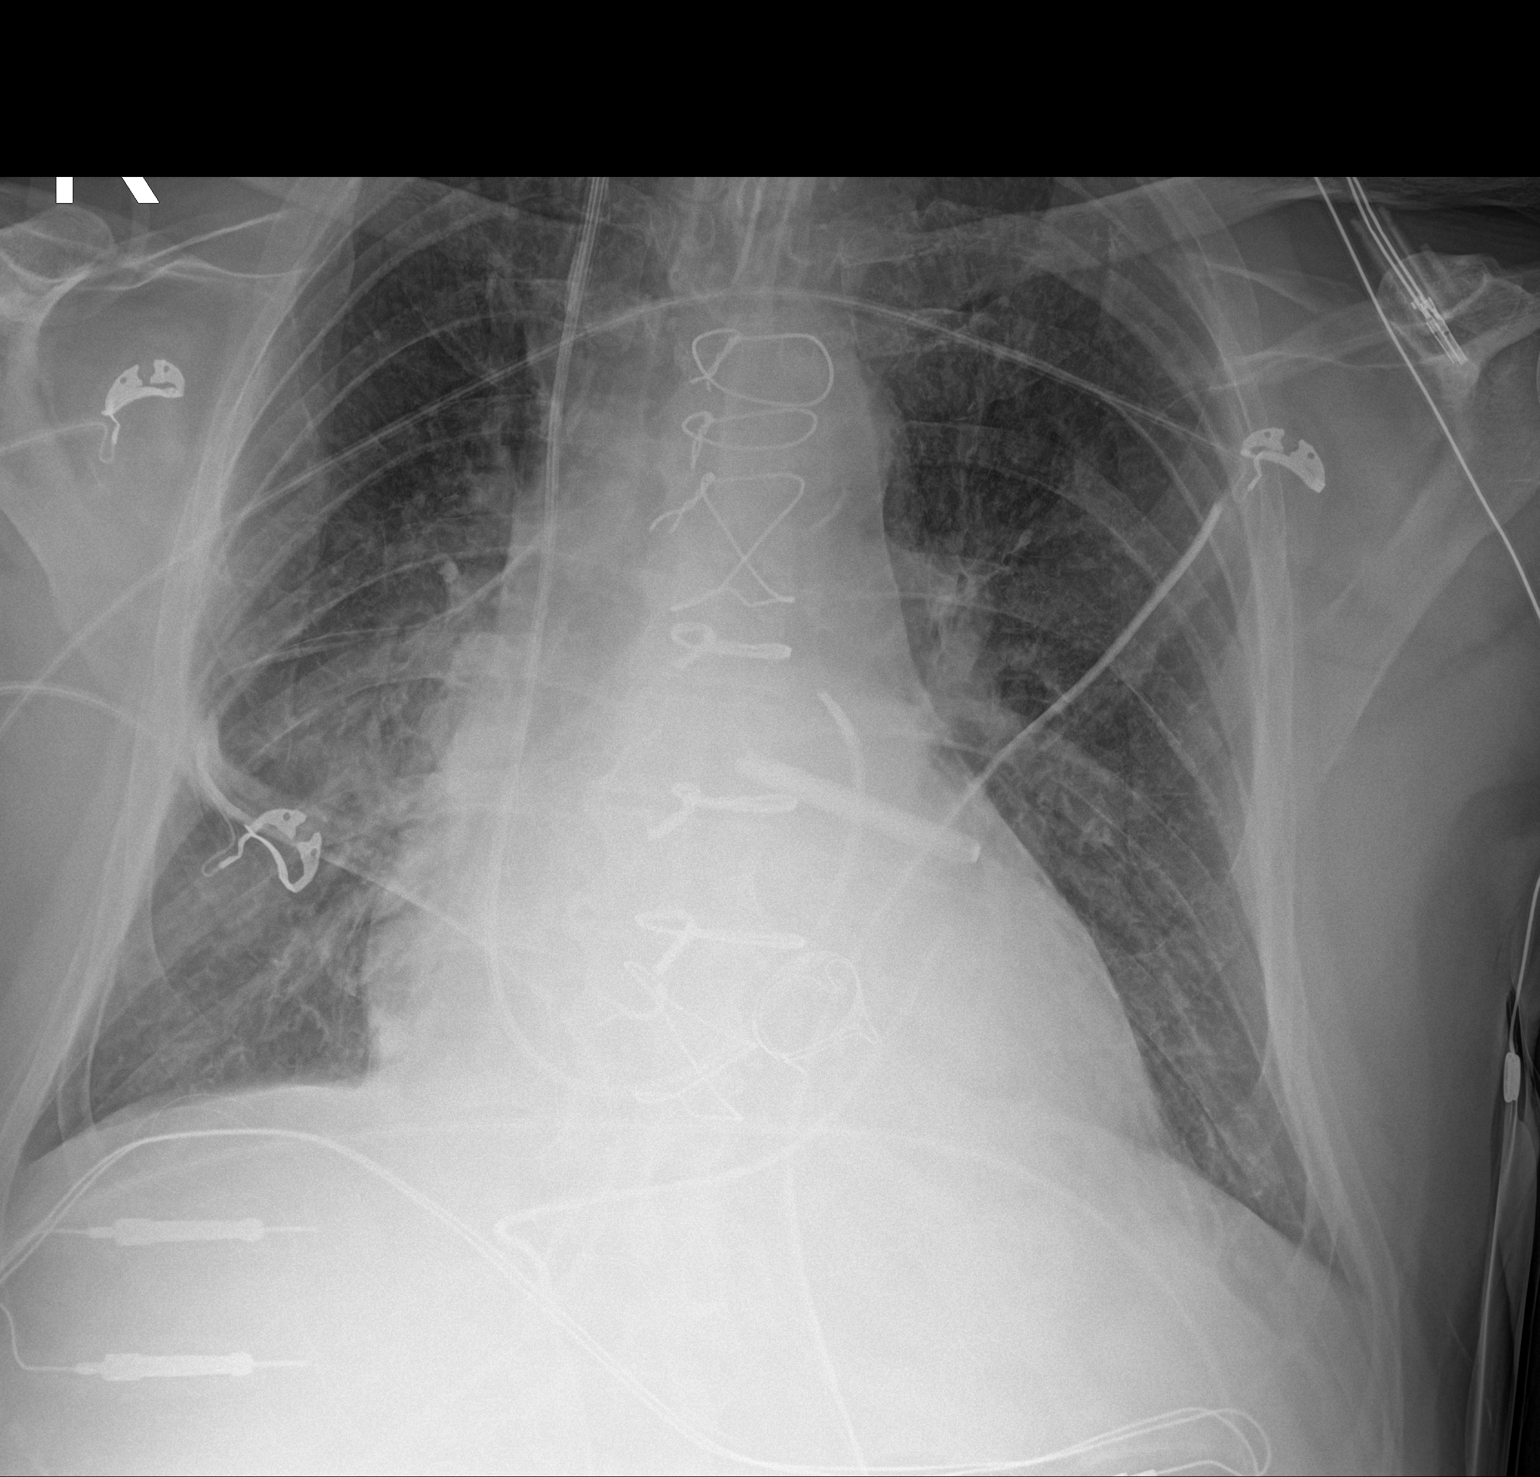

[1 of 1 positions shown; findings below may reference images not displayed]

FINDINGS: Interval postsurgical changes within the knee chest including
cardiac valve replacement, atrial appendage clip, and median
sternotomy. Right IJ approach Swan-Ganz catheter terminates at the
level of the proximal pulmonary outflow. Bilateral chest tubes and
mediastinal drain in place. Endotracheal tube terminates 5.5 cm
above the carina.

Stable cardiomegaly. Atherosclerotic calcification of the aortic
knob. Streaky right perihilar opacity. No appreciable pleural fluid
collection. No pneumothorax.
IMPRESSION: 1. Interval postsurgical changes within the chest with support
apparatus, as above. No acute findings.
2. Streaky right perihilar opacity, likely atelectasis.

## 2022-03-02 IMAGING — DX DG CHEST 1V PORT
1 series · 1 of 1 positions shown · non-contrast
Comparison: 03/09/2021

CLINICAL DATA: Mitral valve replacement.

EXAM:
PORTABLE CHEST 1 VIEW

[chest]
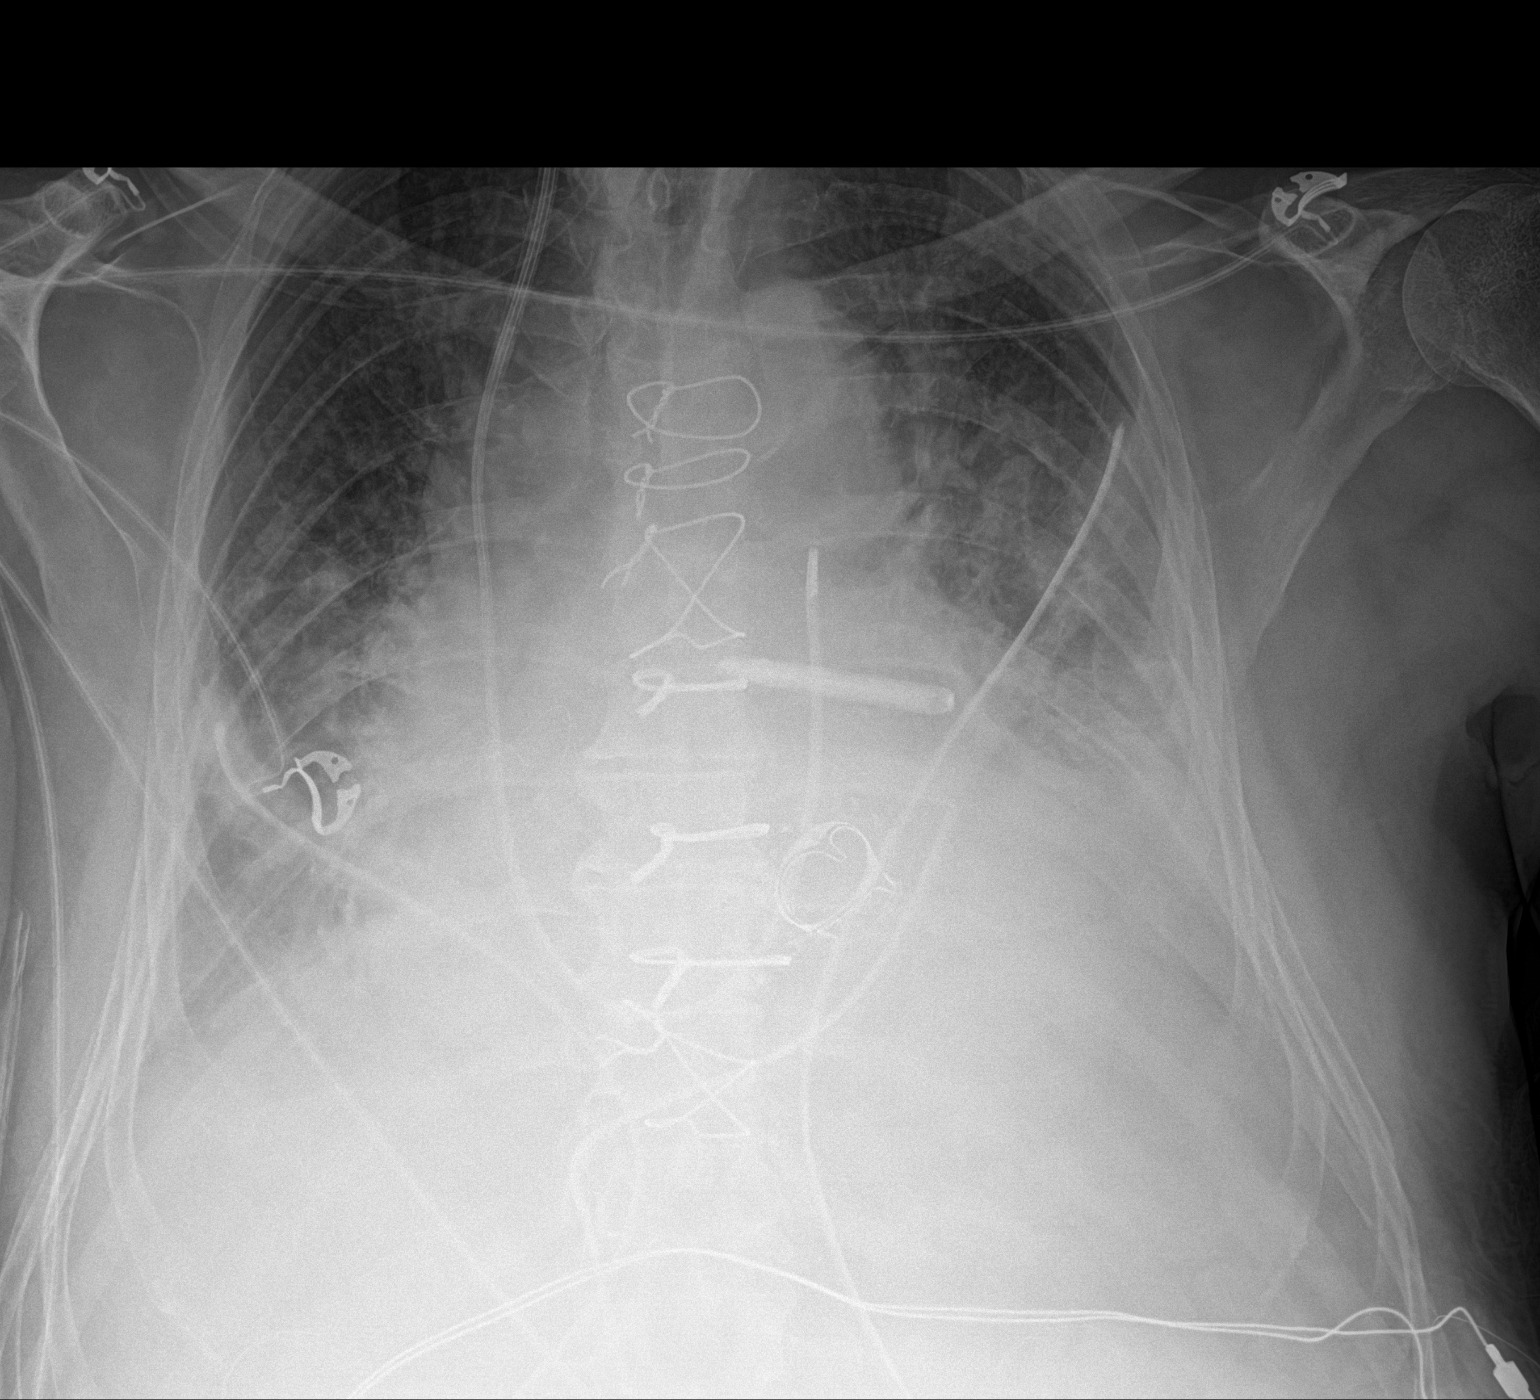

[1 of 1 positions shown; findings below may reference images not displayed]

FINDINGS: 7695 hours. The cardio pericardial silhouette is enlarged. There is
pulmonary vascular congestion without overt pulmonary edema.
Bibasilar atelectasis is similar to prior with probable left
effusion. Endotracheal tube has been removed in the interval.
Bilateral chest tubes remain in place with mediastinal/pericardial
drain noted over the midline. Right IJ pulmonary artery catheter tip
is in the pulmonary outflow tract, potentially just into the left
main pulmonary artery
IMPRESSION: Interval extubation. Otherwise no substantial change.

## 2022-04-14 ENCOUNTER — Ambulatory Visit: Payer: No Typology Code available for payment source | Admitting: Nurse Practitioner

## 2022-04-14 ENCOUNTER — Ambulatory Visit: Payer: Medicare Other

## 2022-04-15 ENCOUNTER — Ambulatory Visit (INDEPENDENT_AMBULATORY_CARE_PROVIDER_SITE_OTHER): Payer: Medicare Other | Admitting: Nurse Practitioner

## 2022-04-15 ENCOUNTER — Encounter: Payer: Self-pay | Admitting: Nurse Practitioner

## 2022-04-15 VITALS — BP 128/72 | HR 60 | Temp 98.3°F | Ht 71.0 in | Wt 207.6 lb

## 2022-04-15 DIAGNOSIS — I5033 Acute on chronic diastolic (congestive) heart failure: Secondary | ICD-10-CM

## 2022-04-15 DIAGNOSIS — N5089 Other specified disorders of the male genital organs: Secondary | ICD-10-CM

## 2022-04-15 DIAGNOSIS — I10 Essential (primary) hypertension: Secondary | ICD-10-CM | POA: Diagnosis not present

## 2022-04-15 DIAGNOSIS — M5440 Lumbago with sciatica, unspecified side: Secondary | ICD-10-CM

## 2022-04-15 DIAGNOSIS — M797 Fibromyalgia: Secondary | ICD-10-CM

## 2022-04-15 DIAGNOSIS — I480 Paroxysmal atrial fibrillation: Secondary | ICD-10-CM

## 2022-04-15 DIAGNOSIS — Z23 Encounter for immunization: Secondary | ICD-10-CM

## 2022-04-15 DIAGNOSIS — E559 Vitamin D deficiency, unspecified: Secondary | ICD-10-CM

## 2022-04-15 DIAGNOSIS — I341 Nonrheumatic mitral (valve) prolapse: Secondary | ICD-10-CM | POA: Diagnosis not present

## 2022-04-15 DIAGNOSIS — Z6830 Body mass index (BMI) 30.0-30.9, adult: Secondary | ICD-10-CM

## 2022-04-15 DIAGNOSIS — R3914 Feeling of incomplete bladder emptying: Secondary | ICD-10-CM

## 2022-04-15 DIAGNOSIS — E1169 Type 2 diabetes mellitus with other specified complication: Secondary | ICD-10-CM

## 2022-04-15 DIAGNOSIS — G8929 Other chronic pain: Secondary | ICD-10-CM

## 2022-04-15 DIAGNOSIS — E785 Hyperlipidemia, unspecified: Secondary | ICD-10-CM

## 2022-04-15 DIAGNOSIS — Z125 Encounter for screening for malignant neoplasm of prostate: Secondary | ICD-10-CM

## 2022-04-15 LAB — BAYER DCA HB A1C WAIVED: HB A1C (BAYER DCA - WAIVED): 6.9 % — ABNORMAL HIGH (ref 4.8–5.6)

## 2022-04-15 MED ORDER — CYCLOBENZAPRINE HCL 10 MG PO TABS: 10.0000 mg | ORAL_TABLET | Freq: Three times a day (TID) | ORAL | 0 refills | Status: DC | PRN

## 2022-04-15 MED ORDER — HYDROXYZINE HCL 10 MG PO TABS: 10.0000 mg | ORAL_TABLET | Freq: Every evening | ORAL | 5 refills | Status: DC | PRN

## 2022-04-15 MED ORDER — APIXABAN 5 MG PO TABS: ORAL_TABLET | ORAL | 6 refills | Status: DC

## 2022-04-15 MED ORDER — SPIRONOLACTONE 25 MG PO TABS: 12.5000 mg | ORAL_TABLET | Freq: Every day | ORAL | 6 refills | Status: DC

## 2022-04-15 MED ORDER — ROSUVASTATIN CALCIUM 10 MG PO TABS: 10.0000 mg | ORAL_TABLET | Freq: Every day | ORAL | 1 refills | Status: DC

## 2022-04-15 MED ORDER — AMLODIPINE BESYLATE 10 MG PO TABS: 10.0000 mg | ORAL_TABLET | Freq: Every day | ORAL | 1 refills | Status: DC

## 2022-04-15 MED ORDER — METFORMIN HCL 1000 MG PO TABS: 1000.0000 mg | ORAL_TABLET | Freq: Two times a day (BID) | ORAL | 1 refills | Status: DC

## 2022-04-15 MED ORDER — AMIODARONE HCL 200 MG PO TABS: 200.0000 mg | ORAL_TABLET | Freq: Every day | ORAL | 3 refills | Status: DC

## 2022-04-15 MED ORDER — LOSARTAN POTASSIUM 100 MG PO TABS: ORAL_TABLET | ORAL | 1 refills | Status: DC

## 2022-04-15 MED ORDER — SITAGLIPTIN PHOSPHATE 100 MG PO TABS: ORAL_TABLET | ORAL | 1 refills | Status: DC

## 2022-04-15 NOTE — Progress Notes (Signed)
Subjective:    Patient ID: Michael Valenzuela, male    DOB: 17-Nov-1955, 66 y.o.   MRN: [id number]   Chief Complaint: annual physical   HPI:  Michael Valenzuela is a 66 y.o. who identifies as a male who was assigned male at birth.   Social history: Lives with: wife Work history: disability   Comes in today for follow up of the following chronic medical issues:  1. Annual physical exam  2. Essential hypertension, benign No c/o chest pain SOB or headache. Does not check blood pressure at home. BP Readings from Last 3 Encounters:  02/12/22 (!) 143/85  02/03/22 (!) 150/78  10/21/21 (!) 143/77     3. Paroxysmal atrial fibrillation (HCC) Denies palpitations and heart racing  4. 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 Acute on chronic heart failure with preserved ejection fraction (HFpEF)/in the setting of severe mitral valve prolapse Last saw cardiology on 02/03/22. Review of office note revealed no changes to plan of care. Had mitral valve repaired and is doing well  6. Type 2 diabetes mellitus with other specified complication, without long-term current use of insulin (HCC) Fasting blood sugars are running around 120-130.Marland Kitchen No low blood sugars Lab Results  Component Value Date   HGBA1C 6.3 (H) 10/21/2021    7. Hyperlipidemia with target LDL less than 100 Does try to watch diet but does no dedicated exercise. Lab Results  Component Value Date   CHOL 142 10/21/2021   HDL 42 10/21/2021   LDLCALC 53 10/21/2021   TRIG 306 (H) 10/21/2021   CHOLHDL 3.4 10/21/2021  The 10-year ASCVD risk score (Arnett DK, et al., 2019) is: 25.3%    8. Fibromyalgia 9. Chronic midline low back pain with sciatica, sciatica laterality unspecified Sees pain management monthly. Still c/o pain 8/10, says pain meds do not help  10. Vitamin D deficiency Is on daily vitamin d supplement  11. BMI 30.0-30.9,adult No  recent weight changes Wt Readings from Last 3 Encounters:  04/15/22 207 lb 9.6 oz (94.2 kg)  02/12/22 205 lb (93 kg)  02/03/22 207 lb 3.2 oz (94 kg)   BMI Readings from Last 3 Encounters:  04/15/22 28.95 kg/m  02/12/22 28.59 kg/m  02/03/22 28.90 kg/m     New complaints: Testicular nodules- have had for awhile  No Known Allergies Outpatient Encounter Medications as of 04/15/2022  Medication Sig   amiodarone (PACERONE) 200 MG tablet Take 1 tablet (200 mg total) by mouth daily.   amLODipine (NORVASC) 10 MG tablet Take 1 tablet (10 mg total) by mouth daily.   apixaban (ELIQUIS) 5 MG TABS tablet TAKE 1 TABLET(5 MG) BY MOUTH TWICE DAILY   cholecalciferol (VITAMIN D3) 25 MCG (1000 UNIT) tablet Take 1,000 Units by mouth daily.   glucose blood test strip TrueTrack strips. Test BS qd. Dx E11.9   hydrOXYzine (ATARAX) 10 MG tablet Take 10 mg by mouth at bedtime as needed.   losartan (COZAAR) 100 MG tablet TAKE 1 TABLET(100 MG) BY MOUTH DAILY   Menthol-Camphor (ICY HOT ADVANCED PAIN RELIEF) 16-11 % CREA Apply 1 application topically daily as needed (pain).   metFORMIN (GLUCOPHAGE) 1000 MG tablet Take 1 tablet (1,000 mg total) by mouth 2 (two) times daily with a meal.   Multiple Vitamins-Minerals (MULTIVITAMIN WITH MINERALS) tablet Take 1 tablet by mouth daily.   oxyCODONE (ROXICODONE) 15 MG immediate release tablet Take 15 mg by mouth every 6 (six) hours as needed  for pain.   rosuvastatin (CRESTOR) 10 MG tablet Take 1 tablet (10 mg total) by mouth daily.   sitaGLIPtin (JANUVIA) 100 MG tablet TAKE 1 TABLET(100 MG) BY MOUTH DAILY   spironolactone (ALDACTONE) 25 MG tablet Take 0.5 tablets (12.5 mg total) by mouth daily.   Turmeric Curcumin 500 MG CAPS Take 500 mg by mouth daily.   No facility-administered encounter medications on file as of 04/15/2022.    Past Surgical History:  Procedure Laterality Date   CARDIOVERSION N/A 05/17/2021   Procedure: CARDIOVERSION;  Surgeon: Fay Records, MD;   Location: [facility];  Service: Cardiovascular;  Laterality: N/A;   HERNIA REPAIR     MAZE N/A 03/08/2021   Procedure: MAZE;  Surgeon: Wonda Olds, MD;  Location: [place];  Service: Open Heart Surgery;  Laterality: N/A;   MITRAL VALVE REPAIR N/A 03/08/2021   Procedure: MITRAL VALVE REPAIR USING  PHYSIO RING SIZE 28 (EXPLANTED) THEN MITRAL VALVE REPLACEMENT (MVR) WITH MAGNA EASE MITRAL VALVE SIZE 25MM  ON PUMP, LEFT ATRIAL APPENDAGE CLIPPING USING A SIZE 50 ATRIAL CLIP;  Surgeon: Wonda Olds, MD;  Location: [place];  Service: Open Heart Surgery;  Laterality: N/A;   RIGHT/LEFT HEART CATH AND CORONARY ANGIOGRAPHY N/A 03/05/2021   Procedure: RIGHT/LEFT HEART CATH AND CORONARY ANGIOGRAPHY;  Surgeon: Leonie Man, MD;  Location: [place] [facility];  Service: Cardiovascular;  Laterality: N/A;   TEE WITHOUT CARDIOVERSION N/A 03/05/2021   Procedure: TRANSESOPHAGEAL ECHOCARDIOGRAM (TEE);  Surgeon: Donato Heinz, MD;  Location: [place];  Service: Cardiovascular;  Laterality: N/A;   TEE WITHOUT CARDIOVERSION N/A 03/08/2021   Procedure: TRANSESOPHAGEAL ECHOCARDIOGRAM (TEE);  Surgeon: Wonda Olds, MD;  Location: [place];  Service: Open Heart Surgery;  Laterality: N/A;   TEE WITHOUT CARDIOVERSION N/A 05/17/2021   Procedure: TRANSESOPHAGEAL ECHOCARDIOGRAM (TEE);  Surgeon: Fay Records, MD;  Location: [facility];  Service: Cardiovascular;  Laterality: N/A;    Family History  Problem Relation Age of Onset   Heart disease Mother    Diabetes Mother    Cancer Mother    Heart disease Father    Diabetes Son 9       type 1   Diabetes Paternal Uncle    Heart attack Brother    Hypertension Son       Controlled substance contract: N/A     Review of Systems  Constitutional:  Negative for diaphoresis.  Eyes:  Negative for pain.  Respiratory:  Negative for shortness of breath.   Cardiovascular:  Negative for chest pain, palpitations and leg swelling.  Gastrointestinal:  Negative for  abdominal pain.  Endocrine: Negative for polydipsia.  Skin:  Negative for rash.  Neurological:  Negative for dizziness, weakness and headaches.  Hematological:  Does not bruise/bleed easily.  All other systems reviewed and are negative.     Objective:   Physical Exam Vitals and nursing note reviewed.  Constitutional:      Appearance: Normal appearance. He is well-developed.  HENT:     Head: Normocephalic.     Nose: Nose normal.     Mouth/Throat:     Mouth: Mucous membranes are moist.     Pharynx: Oropharynx is clear.  Eyes:     Pupils: Pupils are equal, round, and reactive to light.  Neck:     Thyroid: No thyroid mass or thyromegaly.     Vascular: No carotid bruit or JVD.     Trachea: Phonation normal.  Cardiovascular:     Rate and  Rhythm: Normal rate and regular rhythm.  Pulmonary:     Effort: Pulmonary effort is normal. No respiratory distress.     Breath sounds: Normal breath sounds.  Abdominal:     General: Bowel sounds are normal.     Palpations: Abdomen is soft.     Tenderness: There is no abdominal tenderness.  Genitourinary:    Comments: Bilateral enlarged tender testicles bil. Right  larger the left Musculoskeletal:        General: Normal range of motion.     Cervical back: Normal range of motion and neck supple.     Comments: Hobbling gait  Lymphadenopathy:     Cervical: No cervical adenopathy.  Skin:    General: Skin is warm and dry.  Neurological:     Mental Status: He is alert and oriented to person, place, and time.  Psychiatric:        Behavior: Behavior normal.        Thought Content: Thought content normal.        Judgment: Judgment normal.    BP 128/72   Pulse 60   Temp 98.3 F (36.8 C) (Oral)   Ht _0  (1.803 m)   Wt 207 lb 9.6 oz (94.2 kg)   SpO2 95%   BMI 28.95 kg/m    Hgba1c 6.9%    Assessment & Plan:   Melesio Madara comes in today with chief complaint of Medical Management of Chronic Issues   Diagnosis and orders  addressed:  1. Essential hypertension, benign Low sodium diet - CBC with Differential/Platelet - CMP14+EGFR - losartan (COZAAR) 100 MG tablet; TAKE 1 TABLET(100 MG) BY MOUTH DAILY  Dispense: 90 tablet; Refill: 1 - spironolactone (ALDACTONE) 25 MG tablet; Take 0.5 tablets (12.5 mg total) by mouth daily.  Dispense: 15 tablet; Refill: 6 - amLODipine (NORVASC) 10 MG tablet; Take 1 tablet (10 mg total) by mouth daily.  Dispense: 90 tablet; Refill: 1  2. Paroxysmal atrial fibrillation (HCC) Keep follow up with cardiology - amiodarone (PACERONE) 200 MG tablet; Take 1 tablet (200 mg total) by mouth daily.  Dispense: 90 tablet; Refill: 3 - apixaban (ELIQUIS) 5 MG TABS tablet; TAKE 1 TABLET(5 MG) BY MOUTH TWICE DAILY  Dispense: 60 tablet; Refill: 6  3. 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 Acute on chronic heart failure with preserved ejection fraction (HFpEF)/in the setting of severe mitral valve prolapse Keep follow up with cardiology  5. Type 2 diabetes mellitus with other specified complication, without long-term current use of insulin (HCC) Continue to  watch carbs in diet - Bayer DCA Hb A1c Waived - metFORMIN (GLUCOPHAGE) 1000 MG tablet; Take 1 tablet (1,000 mg total) by mouth 2 (two) times daily with a meal.  Dispense: 180 tablet; Refill: 1 - sitaGLIPtin (JANUVIA) 100 MG tablet; TAKE 1 TABLET(100 MG) BY MOUTH DAILY  Dispense: 90 tablet; Refill: 1  6. Hyperlipidemia with target LDL less than 100 Low fat diet - Lipid panel - rosuvastatin (CRESTOR) 10 MG tablet; Take 1 tablet (10 mg total) by mouth daily.  Dispense: 90 tablet; Refill: 1  7. Fibromyalgia  8. Chronic midline low back pain with sciatica, sciatica laterality unspecified Continue pain management - cyclobenzaprine (FLEXERIL) 10 MG tablet; Take 1 tablet (10 mg total) by mouth 3 (three) times daily as needed for muscle spasms.  Dispense: 30 tablet;  Refill: 0  9. Vitamin D deficiency Continue vitamin d supplement  10. BMI 30.0-30.9,adult Discussed diet and  exercise for person with BMI >25 Will recheck weight in 3-6 months   11. Prostate cancer screening Labs pending - PSA, total and free  12. Feeling of incomplete bladder emptying - PSA, total and free  13. Testicular swelling - US Scrotum; Future   Labs pending Health Maintenance reviewed Diet and exercise encouraged  Follow up plan: 6 months   [name] Done, FNP

## 2022-04-15 NOTE — Addendum Note (Signed)
Addended by: Antonietta Barcelona D on: 04/15/2022 12:11 PM   Modules accepted: Orders

## 2022-04-15 NOTE — Patient Instructions (Signed)
Hydrocele, Adult A hydrocele is a collection of fluid in the loose pouch of skin that holds the testicles (scrotum). It can occur in one or both testicles. This may happen because: The amount of fluid produced in the scrotum is not absorbed by the rest of the body. Fluid from the abdomen fills the scrotum. Normally, the testicles develop in the abdomen and then drop into the scrotum before birth. The tube that the testicles travel through usually closes after the testicles drop. If the tube does not close, fluid from the abdomen can fill the scrotum. This is not very common in adults. What are the causes? A hydrocele may be caused by: An injury to the scrotum. An infection. Decreased blood flow to the scrotum. Twisting of a testicle (testicular torsion). A birth defect. A tumor or cancer of the testicle. Sometimes, the cause is not known. What are the signs or symptoms? A hydrocele feels like a water-filled balloon. It may also feel heavy. Other symptoms include: Swelling of the scrotum. The swelling may decrease when you lie down. You may also notice more swelling at night than in the morning. This is called a communicating hydrocele, in which the fluid in the scrotum goes back into the abdominal cavity when the position of the scrotum changes. Swelling of the groin. Mild discomfort in the scrotum. Pain. This can develop if the hydrocele was caused by infection or twisting. The larger the hydrocele, the more likely you are to have pain. Swelling may also cause pain. How is this diagnosed? This condition may be diagnosed based on a physical exam and your medical history. You may also have tests, including: Imaging tests, such as an ultrasound. A transillumination test. This test takes place in a dark room where a light is placed on the skin of the scrotum. Clear liquid will not impede the light and the scrotum will be illuminated. This helps a health care provider distinguish a hydrocele from a  tumor. Blood or urine tests. How is this treated? Most hydroceles go away on their own. If you have no discomfort or pain, your health care provider may suggest close monitoring of your condition until the condition goes away or symptoms develop. This is called watch and wait or watchful waiting. If treatment is needed, it may include: Treating an underlying condition. This may include taking an antibiotic medicine to treat an infection. Having surgery to stop fluid from collecting in the scrotum. Having surgery to drain the fluid. Surgery may include: Hydrocelectomy. For this procedure, an incision is made in the scrotum to remove the fluid. Needle aspiration. A needle is used to drain fluid. However, the fluid buildup will come back quickly and may lead to an infection of the scrotum. This is rarely done. Follow these instructions at home: Medicines Take over-the-counter and prescription medicines only as told by your health care provider. If you were prescribed an antibiotic medicine, take it as told by your health care provider. Do not stop taking the antibiotic even if you start to feel better. General instructions Watch the hydrocele for any changes. Keep all follow-up visits. This is important. Contact a health care provider if: You notice any changes in the hydrocele. The swelling in your scrotum or groin gets worse. The hydrocele becomes red, firm, painful, or tender to the touch. You have a fever. Get help right away if you: Develop a lot of pain or your pain becomes worse. Have chills. Have a high fever. Summary A hydrocele is   a collection of fluid in the loose pouch of skin that holds the testicles (scrotum). A hydrocele can cause swelling, discomfort, and pain. In adults, the cause of a hydrocele may not be known. However, it is sometimes caused by an infection or the twisting of a testicle. Hydroceles often go away on their own. If a hydrocele causes pain, treating the  underlying cause may be needed to ease the pain. This information is not intended to replace advice given to you by your health care provider. Make sure you discuss any questions you have with your health care provider. Document Revised: 06/13/2021 Document Reviewed: 06/13/2021 Elsevier Patient Education  2023 Elsevier Inc.  

## 2022-04-16 LAB — CBC WITH DIFFERENTIAL/PLATELET
Basophils Absolute: 0.1 10*3/uL (ref 0.0–0.2)
Basos: 1 %
EOS (ABSOLUTE): 0.5 10*3/uL — ABNORMAL HIGH (ref 0.0–0.4)
Eos: 7 %
Hematocrit: 43.5 % (ref 37.5–51.0)
Hemoglobin: 14.3 g/dL (ref 13.0–17.7)
Immature Grans (Abs): 0 10*3/uL (ref 0.0–0.1)
Immature Granulocytes: 0 %
Lymphocytes Absolute: 1.4 10*3/uL (ref 0.7–3.1)
Lymphs: 20 %
MCH: 27.7 pg (ref 26.6–33.0)
MCHC: 32.9 g/dL (ref 31.5–35.7)
MCV: 84 fL (ref 79–97)
Monocytes Absolute: 0.5 10*3/uL (ref 0.1–0.9)
Monocytes: 7 %
Neutrophils Absolute: 4.7 10*3/uL (ref 1.4–7.0)
Neutrophils: 65 %
Platelets: 252 10*3/uL (ref 150–450)
RBC: 5.17 x10E6/uL (ref 4.14–5.80)
RDW: 15.3 % (ref 11.6–15.4)
WBC: 7.2 10*3/uL (ref 3.4–10.8)

## 2022-04-16 LAB — CMP14+EGFR
ALT: 106 IU/L — ABNORMAL HIGH (ref 0–44)
AST: 148 IU/L — ABNORMAL HIGH (ref 0–40)
Albumin/Globulin Ratio: 1.6 (ref 1.2–2.2)
Albumin: 4.6 g/dL (ref 3.8–4.8)
Alkaline Phosphatase: 88 IU/L (ref 44–121)
BUN/Creatinine Ratio: 18 (ref 10–24)
BUN: 14 mg/dL (ref 8–27)
Bilirubin Total: 0.4 mg/dL (ref 0.0–1.2)
CO2: 21 mmol/L (ref 20–29)
Calcium: 9.7 mg/dL (ref 8.6–10.2)
Chloride: 99 mmol/L (ref 96–106)
Creatinine, Ser: 0.8 mg/dL (ref 0.76–1.27)
Globulin, Total: 2.8 g/dL (ref 1.5–4.5)
Glucose: 128 mg/dL — ABNORMAL HIGH (ref 70–99)
Potassium: 4.5 mmol/L (ref 3.5–5.2)
Sodium: 137 mmol/L (ref 134–144)
Total Protein: 7.4 g/dL (ref 6.0–8.5)
eGFR: 98 mL/min/{1.73_m2} (ref >59)

## 2022-04-16 LAB — LIPID PANEL
Chol/HDL Ratio: 3.2 ratio (ref 0.0–5.0)
Cholesterol, Total: 132 mg/dL (ref 100–199)
HDL: 41 mg/dL (ref >39)
LDL Chol Calc (NIH): 51 mg/dL (ref 0–99)
Triglycerides: 254 mg/dL — ABNORMAL HIGH (ref 0–149)
VLDL Cholesterol Cal: 40 mg/dL (ref 5–40)

## 2022-04-16 LAB — PSA, TOTAL AND FREE
PSA, Free Pct: 7.9 %
PSA, Free: 0.23 ng/mL
Prostate Specific Ag, Serum: 2.9 ng/mL (ref 0.0–4.0)

## 2022-04-17 ENCOUNTER — Other Ambulatory Visit: Payer: Self-pay | Admitting: Nurse Practitioner

## 2022-04-17 DIAGNOSIS — N5089 Other specified disorders of the male genital organs: Secondary | ICD-10-CM

## 2022-04-17 NOTE — Progress Notes (Signed)
Us/

## 2022-04-25 ENCOUNTER — Ambulatory Visit (HOSPITAL_COMMUNITY)
Admission: RE | Admit: 2022-04-25 | Discharge: 2022-04-25 | Disposition: A | Discharge status: Home / Self Care | Payer: Medicare Other | Source: Ambulatory Visit | Attending: Nurse Practitioner | Admitting: Nurse Practitioner

## 2022-04-25 DIAGNOSIS — N5089 Other specified disorders of the male genital organs: Secondary | ICD-10-CM | POA: Insufficient documentation

## 2022-04-26 ENCOUNTER — Other Ambulatory Visit: Payer: Self-pay | Admitting: Nurse Practitioner

## 2022-04-29 ENCOUNTER — Other Ambulatory Visit: Payer: Self-pay | Admitting: Physician Assistant

## 2022-04-29 DIAGNOSIS — N433 Hydrocele, unspecified: Secondary | ICD-10-CM

## 2022-05-08 ENCOUNTER — Ambulatory Visit (INDEPENDENT_AMBULATORY_CARE_PROVIDER_SITE_OTHER): Payer: Medicare Other | Admitting: Urology

## 2022-05-08 ENCOUNTER — Encounter: Payer: Self-pay | Admitting: Urology

## 2022-05-08 VITALS — BP 142/87 | HR 59

## 2022-05-08 DIAGNOSIS — N433 Hydrocele, unspecified: Secondary | ICD-10-CM

## 2022-05-08 DIAGNOSIS — R351 Nocturia: Secondary | ICD-10-CM | POA: Diagnosis not present

## 2022-05-08 LAB — URINALYSIS, ROUTINE W REFLEX MICROSCOPIC
Bilirubin, UA: NEGATIVE
Glucose, UA: NEGATIVE
Ketones, UA: NEGATIVE
Leukocytes,UA: NEGATIVE
Nitrite, UA: NEGATIVE
RBC, UA: NEGATIVE
Specific Gravity, UA: 1.025 (ref 1.005–1.030)
Urobilinogen, Ur: 1 mg/dL (ref 0.2–1.0)
pH, UA: 6 (ref 5.0–7.5)

## 2022-05-08 NOTE — Progress Notes (Signed)
Assessment: 1. Hydrocele, bilateral   2. Nocturia      Plan: I reviewed the patient's records including his recent scrotal ultrasound showing bilateral hydroceles. Diagnosis and management of hydroceles discussed with the patient.  Options including observation versus hydrocelectomy discussed.  He is currently asymptomatic and does not wish to undergo surgery unless absolutely necessary. Recommend continued observation at this time. Return to office as needed.  Chief Complaint:  Chief Complaint  Patient presents with   Hydrocele    History of Present Illness:  Michael Valenzuela is a 66 y.o. year old male who is seen in consultation from Thomasene Mohair, PA-C for evaluation of scrotal enlargement.  He has noted bilateral scrotal enlargement for a number of years.  The scrotal enlargement has been stable in size for some time.  He is not having any pain.  No scrotal erythema.  No change in size of the scrotum with change in position.  No history of scrotal trauma or infection. Scrotal ultrasound from 04/28/2022 demonstrated moderate to large bilateral hydrocele, right greater than sign left.  He reports lower urinary tract symptoms including frequency, nocturia every 1-2 hours, and postvoid dribbling.  No dysuria or gross hematuria. IPSS = 15 today.  PSA from 04/15/2022: 2.9  Past Medical History:  Past Medical History:  Diagnosis Date   Arthritis    Chronic low back pain    Diabetes mellitus without complication ([place])    Fibromyalgia    Hyperlipidemia    Hypertension    Vitamin D deficiency     Past Surgical History:  Past Surgical History:  Procedure Laterality Date   CARDIOVERSION N/A 05/17/2021   Procedure: CARDIOVERSION;  Surgeon: Fay Records, MD;  Location: [facility];  Service: Cardiovascular;  Laterality: N/A;   HERNIA REPAIR     MAZE N/A 03/08/2021   Procedure: MAZE;  Surgeon: Wonda Olds, MD;  Location: [place];  Service: Open Heart Surgery;  Laterality: N/A;   MITRAL  VALVE REPAIR N/A 03/08/2021   Procedure: MITRAL VALVE REPAIR USING  PHYSIO RING SIZE 28 (EXPLANTED) THEN MITRAL VALVE REPLACEMENT (MVR) WITH MAGNA EASE MITRAL VALVE SIZE 25MM  ON PUMP, LEFT ATRIAL APPENDAGE CLIPPING USING A SIZE 50 ATRIAL CLIP;  Surgeon: Wonda Olds, MD;  Location: [place];  Service: Open Heart Surgery;  Laterality: N/A;   RIGHT/LEFT HEART CATH AND CORONARY ANGIOGRAPHY N/A 03/05/2021   Procedure: RIGHT/LEFT HEART CATH AND CORONARY ANGIOGRAPHY;  Surgeon: Leonie Man, MD;  Location: [place] [facility];  Service: Cardiovascular;  Laterality: N/A;   TEE WITHOUT CARDIOVERSION N/A 03/05/2021   Procedure: TRANSESOPHAGEAL ECHOCARDIOGRAM (TEE);  Surgeon: Donato Heinz, MD;  Location: [place];  Service: Cardiovascular;  Laterality: N/A;   TEE WITHOUT CARDIOVERSION N/A 03/08/2021   Procedure: TRANSESOPHAGEAL ECHOCARDIOGRAM (TEE);  Surgeon: Wonda Olds, MD;  Location: [place];  Service: Open Heart Surgery;  Laterality: N/A;   TEE WITHOUT CARDIOVERSION N/A 05/17/2021   Procedure: TRANSESOPHAGEAL ECHOCARDIOGRAM (TEE);  Surgeon: Fay Records, MD;  Location: [facility];  Service: Cardiovascular;  Laterality: N/A;    Allergies:  No Known Allergies  Family History:  Family History  Problem Relation Age of Onset   Heart disease Mother    Diabetes Mother    Cancer Mother    Heart disease Father    Diabetes Son 44       type 1   Diabetes Paternal Uncle    Heart attack Brother    Hypertension Son  Social History:  Social History   Tobacco Use   Smoking status: Former    Packs/day: 0.50    Years: 20.00    Total pack years: 10.00    Types: Cigarettes    Quit date: 02/09/2019    Years since quitting: 3.2   Smokeless tobacco: Never  Vaping Use   Vaping Use: Never used  Substance Use Topics   Alcohol use: Yes    Comment: occasional   Drug use: No    Review of symptoms:  Constitutional:  Negative for unexplained weight loss, night sweats, fever, chills ENT:   Negative for nose bleeds, sinus pain, painful swallowing CV:  Negative for chest pain, shortness of breath, exercise intolerance, palpitations, loss of consciousness Resp:  Negative for cough, wheezing, shortness of breath GI:  Negative for nausea, vomiting, diarrhea, bloody stools GU:  Positives noted in HPI; otherwise negative for gross hematuria, dysuria, urinary incontinence Neuro:  Negative for seizures, poor balance, limb weakness, slurred speech Psych:  Negative for lack of energy, depression, anxiety Endocrine:  Negative for polydipsia, polyuria, symptoms of hypoglycemia (dizziness, hunger, sweating) Hematologic:  Negative for anemia, purpura, petechia, prolonged or excessive bleeding, use of anticoagulants  Allergic:  Negative for difficulty breathing or choking as a result of exposure to anything; no shellfish allergy; no allergic response (rash/itch) to materials, foods  Physical exam: BP (!) 142/87   Pulse (!) 59  GENERAL APPEARANCE:  Well appearing, well developed, well nourished, NAD HEENT: Atraumatic, Normocephalic, oropharynx clear. NECK: Supple without lymphadenopathy or thyromegaly. LUNGS: Clear to auscultation bilaterally. HEART: Regular Rate and Rhythm without murmurs, gallops, or rubs. ABDOMEN: Soft, non-tender, No Masses. EXTREMITIES: Moves all extremities well.  Without clubbing, cyanosis, or edema. NEUROLOGIC:  Alert and oriented x 3, normal gait, CN II-XII grossly intact.  MENTAL STATUS:  Appropriate. BACK:  Non-tender to palpation.  No CVAT SKIN:  Warm, dry and intact.   GU: Penis:  circumcised Meatus: Normal Scrotum: Bilateral enlargement, R>L Testis: Unable to palpate right secondary to hydrocele; left normal Prostate: 40 g, nontender, no nodules Rectum: Normal tone,  no masses or tenderness   Results: U/A: Dipstick negative

## 2022-05-26 ENCOUNTER — Other Ambulatory Visit: Payer: Self-pay | Admitting: Nurse Practitioner

## 2022-05-26 DIAGNOSIS — G8929 Other chronic pain: Secondary | ICD-10-CM

## 2022-05-26 NOTE — Telephone Encounter (Signed)
Last OV 04/15/22. Last RF 04/15/22. Next OV 10/16/22

## 2022-06-17 ENCOUNTER — Other Ambulatory Visit: Payer: Self-pay

## 2022-06-17 DIAGNOSIS — I480 Paroxysmal atrial fibrillation: Secondary | ICD-10-CM

## 2022-06-17 MED ORDER — AMIODARONE HCL 200 MG PO TABS: 200.0000 mg | ORAL_TABLET | Freq: Every day | ORAL | 1 refills | Status: DC

## 2022-07-28 ENCOUNTER — Other Ambulatory Visit: Payer: Self-pay | Admitting: Nurse Practitioner

## 2022-07-28 DIAGNOSIS — G8929 Other chronic pain: Secondary | ICD-10-CM

## 2022-07-29 NOTE — Telephone Encounter (Signed)
Last office visit 04/15/22 Last refill 05/26/22, #30, no refills

## 2022-08-06 ENCOUNTER — Ambulatory Visit: Payer: Medicare Other | Attending: Cardiology | Admitting: Cardiology

## 2022-08-06 ENCOUNTER — Encounter: Payer: Self-pay | Admitting: Cardiology

## 2022-08-06 VITALS — BP 134/85 | HR 57 | Ht 71.0 in | Wt 207.4 lb

## 2022-08-06 DIAGNOSIS — Z9889 Other specified postprocedural states: Secondary | ICD-10-CM | POA: Diagnosis present

## 2022-08-06 DIAGNOSIS — Z79899 Other long term (current) drug therapy: Secondary | ICD-10-CM

## 2022-08-06 DIAGNOSIS — I059 Rheumatic mitral valve disease, unspecified: Secondary | ICD-10-CM

## 2022-08-06 DIAGNOSIS — I4891 Unspecified atrial fibrillation: Secondary | ICD-10-CM | POA: Diagnosis present

## 2022-08-06 DIAGNOSIS — I480 Paroxysmal atrial fibrillation: Secondary | ICD-10-CM | POA: Diagnosis present

## 2022-08-06 DIAGNOSIS — I10 Essential (primary) hypertension: Secondary | ICD-10-CM | POA: Diagnosis present

## 2022-08-06 MED ORDER — AMIODARONE HCL 200 MG PO TABS: 100.0000 mg | ORAL_TABLET | Freq: Every day | ORAL | Status: DC

## 2022-08-06 MED ORDER — SPIRONOLACTONE 25 MG PO TABS: 25.0000 mg | ORAL_TABLET | Freq: Every day | ORAL | 6 refills | Status: DC

## 2022-08-06 NOTE — Progress Notes (Signed)
Clinical Summary Michael Valenzuela is a 66 y.o.male seen today for follow up of the following medical problems.        Mitral regurgitation - recent admission with diagnosis of severe MR 02/2021 - 03/08/2021 MVR with magna ease 68m tissue valve along with LAA clipping and MAZE procedure - 04/2021 echo MVR mean gradient 10 mmHg.      05/2021 TEE: LVEF 55-60%, MV leaflets thin without restricted motion and no obstruction. Mean grade 5 mmHg, mild MR   - no SOB/DOE. No recent edema      2. Afib/aflutter - newly diagnosed during 02/2021 admission - s/p LAA clipping and MAZE procedure during his MVR surgery - has been on amio. LAVI is 44   05/17/2021 DCCV - no recent palpitations - compliant with meds, taking amio '200mg'$  daily.  - no bleeding on elqius. -LFTs slightly LFTs. TFTs normal last year, upcoming labs.    - no recent palpitations - no bleeding on eliquis      3. HTN - home bp's 130s-150s/70-80s - frequent urination, prefers not to be on strong diuretic.   - home bp's 130-140s/90s   4. Hyperlipidemia 10/2021 TC 142 TG 306 HDL 42 LDL 53 - upcoming labs with pcp  04/2022 TC 132 TG 254 HDL 41 LDL 51   5. Chronic pain - chronic fibromylagia, followed by pain clinic. On long term opiates.   Past Medical History:  Diagnosis Date   Arthritis    Chronic low back pain    Diabetes mellitus without complication (HCC)    Fibromyalgia    Hyperlipidemia    Hypertension    Vitamin D deficiency      No Known Allergies   Current Outpatient Medications  Medication Sig Dispense Refill   amiodarone (PACERONE) 200 MG tablet Take 1 tablet (200 mg total) by mouth daily. 90 tablet 1   amLODipine (NORVASC) 10 MG tablet Take 1 tablet (10 mg total) by mouth daily. 90 tablet 1   apixaban (ELIQUIS) 5 MG TABS tablet TAKE 1 TABLET(5 MG) BY MOUTH TWICE DAILY 60 tablet 6   cholecalciferol (VITAMIN D3) 25 MCG (1000 UNIT) tablet Take 1,000 Units by mouth daily.     cyclobenzaprine  (FLEXERIL) 10 MG tablet TAKE 1 TABLET(10 MG) BY MOUTH THREE TIMES DAILY AS NEEDED FOR MUSCLE SPASMS 30 tablet 0   glucose blood (TRUETRACK TEST) test strip USE TO TEST BLOOD SUGAR ONCE DAILY 100 strip 3   hydrOXYzine (ATARAX) 10 MG tablet Take 1 tablet (10 mg total) by mouth at bedtime as needed. 30 tablet 5   losartan (COZAAR) 100 MG tablet TAKE 1 TABLET(100 MG) BY MOUTH DAILY 90 tablet 1   Menthol-Camphor (ICY HOT ADVANCED PAIN RELIEF) 16-11 % CREA Apply 1 application topically daily as needed (pain).     metFORMIN (GLUCOPHAGE) 1000 MG tablet Take 1 tablet (1,000 mg total) by mouth 2 (two) times daily with a meal. 180 tablet 1   Multiple Vitamins-Minerals (MULTIVITAMIN WITH MINERALS) tablet Take 1 tablet by mouth daily.     oxyCODONE (ROXICODONE) 15 MG immediate release tablet Take 15 mg by mouth every 6 (six) hours as needed for pain.     rosuvastatin (CRESTOR) 10 MG tablet Take 1 tablet (10 mg total) by mouth daily. 90 tablet 1   sitaGLIPtin (JANUVIA) 100 MG tablet TAKE 1 TABLET(100 MG) BY MOUTH DAILY 90 tablet 1   spironolactone (ALDACTONE) 25 MG tablet Take 0.5 tablets (12.5 mg total) by mouth daily. 15  tablet 6   Turmeric Curcumin 500 MG CAPS Take 500 mg by mouth daily.     No current facility-administered medications for this visit.     Past Surgical History:  Procedure Laterality Date   CARDIOVERSION N/A 05/17/2021   Procedure: CARDIOVERSION;  Surgeon: Fay Records, MD;  Location: [facility];  Service: Cardiovascular;  Laterality: N/A;   HERNIA REPAIR     MAZE N/A 03/08/2021   Procedure: MAZE;  Surgeon: Wonda Olds, MD;  Location: [place];  Service: Open Heart Surgery;  Laterality: N/A;   MITRAL VALVE REPAIR N/A 03/08/2021   Procedure: MITRAL VALVE REPAIR USING  PHYSIO RING SIZE 28 (EXPLANTED) THEN MITRAL VALVE REPLACEMENT (MVR) WITH MAGNA EASE MITRAL VALVE SIZE 25MM  ON PUMP, LEFT ATRIAL APPENDAGE CLIPPING USING A SIZE 50 ATRIAL CLIP;  Surgeon: Wonda Olds, MD;  Location: [place];   Service: Open Heart Surgery;  Laterality: N/A;   RIGHT/LEFT HEART CATH AND CORONARY ANGIOGRAPHY N/A 03/05/2021   Procedure: RIGHT/LEFT HEART CATH AND CORONARY ANGIOGRAPHY;  Surgeon: Leonie Man, MD;  Location: [place] [facility];  Service: Cardiovascular;  Laterality: N/A;   TEE WITHOUT CARDIOVERSION N/A 03/05/2021   Procedure: TRANSESOPHAGEAL ECHOCARDIOGRAM (TEE);  Surgeon: Donato Heinz, MD;  Location: [place];  Service: Cardiovascular;  Laterality: N/A;   TEE WITHOUT CARDIOVERSION N/A 03/08/2021   Procedure: TRANSESOPHAGEAL ECHOCARDIOGRAM (TEE);  Surgeon: Wonda Olds, MD;  Location: [place];  Service: Open Heart Surgery;  Laterality: N/A;   TEE WITHOUT CARDIOVERSION N/A 05/17/2021   Procedure: TRANSESOPHAGEAL ECHOCARDIOGRAM (TEE);  Surgeon: Fay Records, MD;  Location: [facility];  Service: Cardiovascular;  Laterality: N/A;     No Known Allergies    Family History  Problem Relation Age of Onset   Heart disease Mother    Diabetes Mother    Cancer Mother    Heart disease Father    Diabetes Son 10       type 1   Diabetes Paternal Uncle    Heart attack Brother    Hypertension Son      Social History Mr. Earwood reports that he quit smoking about 3 years ago. His smoking use included cigarettes. He has a 10.00 pack-year smoking history. He has never used smokeless tobacco. Mr. Dittus reports current alcohol use.   Review of Systems CONSTITUTIONAL: No weight loss, fever, chills, weakness or fatigue.  HEENT: Eyes: No visual loss, blurred vision, double vision or yellow sclerae.No hearing loss, sneezing, congestion, runny nose or sore throat.  SKIN: No rash or itching.  CARDIOVASCULAR: per hpi RESPIRATORY: No shortness of breath, cough or sputum.  GASTROINTESTINAL: No anorexia, nausea, vomiting or diarrhea. No abdominal pain or blood.  GENITOURINARY: No burning on urination, no polyuria NEUROLOGICAL: No headache, dizziness, syncope, paralysis, ataxia, numbness or  tingling in the extremities. No change in bowel or bladder control.  MUSCULOSKELETAL: No muscle, back pain, joint pain or stiffness.  LYMPHATICS: No enlarged nodes. No history of splenectomy.  PSYCHIATRIC: No history of depression or anxiety.  ENDOCRINOLOGIC: No reports of sweating, cold or heat intolerance. No polyuria or polydipsia.  Marland Kitchen   Physical Examination Today's Vitals   08/06/22 1336  BP: (!) 146/96  Pulse: (!) 57  SpO2: 96%  Weight: 207 lb 6.4 oz (94.1 kg)  Height: '5\' 11"'$  (1.803 m)   Body mass index is 28.93 kg/m.  Gen: resting comfortably, no acute distress HEENT: no scleral icterus, pupils equal round and reactive, no palptable cervical adenopathy,  CV:  RRR, no m/r/g no jvd Resp: Clear to auscultation bilaterally GI: abdomen is soft, non-tender, non-distended, normal bowel sounds, no hepatosplenomegaly MSK: extremities are warm, no edema.  Skin: warm, no rash Neuro:  no focal deficits Psych: appropriate affect   Diagnostic Studies  TEE 03/05/21: IMPRESSIONS   1. Left ventricular ejection fraction, by estimation, is 60 to 65%. The  left ventricle has normal function.   2. Right ventricular systolic function is normal. The right ventricular  size is mildly enlarged.   3. Left atrial size was severely dilated. No left atrial/left atrial  appendage thrombus was detected.   4. Right atrial size was mildly dilated.   5. The aortic valve is tricuspid. Aortic valve regurgitation is not  visualized.   6. The mitral valve is abnormal. Flail P2. Severe mitral valve  regurgitation. MR severe by both 2D ERO and 3D VCA. Systolic flow reversal  in pulmonary veins, consistent with severe MR.   Cath 03/05/21: The left ventricular systolic function is normal. The left ventricular ejection fraction is 55-65% by visual estimate. LV end diastolic pressure is mildly elevated. There is moderate (3+) mitral regurgitation. Hemodynamic findings consistent with mild pulmonary  hypertension. Angiographically normal coronary arteries   SUMMARY Angiographically Normal Coronary Arteries Mild Pulmonary Hypertension with mean PAP 30 mmHg, PCWP 5 mmHg with LVEDP 15 mmHg. Mild-moderately reduced Cardiac Output-Cardiac Index (Fick) 4.44-2.12.       05/2021 TEE IMPRESSIONS     1. Left ventricular ejection fraction, by estimation, is 55 to 60%. The  left ventricle has normal function.   2. Right ventricular systolic function is normal. The right ventricular  size is normal.   3. LA appendage has been clipped surgically No flow seen.   4. 25 mm Magna ease Mitral valve present (03/08/21) Valve is well seated.  Leaflets thin without significant restricted motion. No obstruction seen.  Peak and mean gradients through the valve are 13 and 5 mm Hg respectively  MVA by PT1/2 is 1.44 cm2. By  planimetry from 3 D images were 1.45 cm2. (HR 65bpm). Mild mitral valve  regurgitation.      Assessment and Plan  Severe MR s/p recent MVR - no recent symptoms, valve has looked good on follow up imaging - continue to monitor.    2. Afib/aflutter - remains in SR since DCCV 05/2021, he remains on amio and eliquis - mild uptrendin LFTs, will lower amio to '100mg'$  daily and recheck in 6 weeks. Update TFTs.      3. HTN -remains above goal, increase aldactone to '25mg'$  daily - avoiding diuretic due to chronic issues already with frequent urination.       Arnoldo Lenis, M.D.

## 2022-08-06 NOTE — Addendum Note (Signed)
Addended by: Laurine Blazer on: 08/06/2022 02:22 PM   Modules accepted: Orders

## 2022-08-06 NOTE — Patient Instructions (Signed)
Medication Instructions:  Decrease Amiodarone to '100mg'$  daily Increase Aldactone to '25mg'$  daily  Continue all other medications.     Labwork: CMET, free T4  - orders given today Please do in 6 weeks (around 09/15/22) Office will contact with results via phone, letter or mychart.     Testing/Procedures: none  Follow-Up: 3 months   Any Other Special Instructions Will Be Listed Below (If Applicable).   If you need a refill on your cardiac medications before your next appointment, please call your pharmacy.

## 2022-08-19 ENCOUNTER — Other Ambulatory Visit: Payer: Self-pay | Admitting: Cardiology

## 2022-08-19 DIAGNOSIS — I10 Essential (primary) hypertension: Secondary | ICD-10-CM

## 2022-09-01 ENCOUNTER — Other Ambulatory Visit: Payer: Self-pay | Admitting: Nurse Practitioner

## 2022-09-01 DIAGNOSIS — I480 Paroxysmal atrial fibrillation: Secondary | ICD-10-CM

## 2022-09-16 ENCOUNTER — Other Ambulatory Visit: Payer: Self-pay | Admitting: Nurse Practitioner

## 2022-09-16 DIAGNOSIS — G8929 Other chronic pain: Secondary | ICD-10-CM

## 2022-09-29 ENCOUNTER — Other Ambulatory Visit: Payer: Self-pay | Admitting: Nurse Practitioner

## 2022-09-29 DIAGNOSIS — E1169 Type 2 diabetes mellitus with other specified complication: Secondary | ICD-10-CM

## 2022-10-16 ENCOUNTER — Encounter: Payer: Self-pay | Admitting: Nurse Practitioner

## 2022-10-16 ENCOUNTER — Ambulatory Visit (INDEPENDENT_AMBULATORY_CARE_PROVIDER_SITE_OTHER): Payer: Medicare Other | Admitting: Nurse Practitioner

## 2022-10-16 ENCOUNTER — Other Ambulatory Visit: Payer: Self-pay | Admitting: Nurse Practitioner

## 2022-10-16 VITALS — BP 135/79 | HR 58 | Temp 97.8°F | Resp 20 | Ht 71.0 in | Wt 202.0 lb

## 2022-10-16 DIAGNOSIS — I480 Paroxysmal atrial fibrillation: Secondary | ICD-10-CM | POA: Diagnosis not present

## 2022-10-16 DIAGNOSIS — E785 Hyperlipidemia, unspecified: Secondary | ICD-10-CM | POA: Diagnosis not present

## 2022-10-16 DIAGNOSIS — M797 Fibromyalgia: Secondary | ICD-10-CM

## 2022-10-16 DIAGNOSIS — G8929 Other chronic pain: Secondary | ICD-10-CM

## 2022-10-16 DIAGNOSIS — Z23 Encounter for immunization: Secondary | ICD-10-CM

## 2022-10-16 DIAGNOSIS — E1169 Type 2 diabetes mellitus with other specified complication: Secondary | ICD-10-CM | POA: Diagnosis not present

## 2022-10-16 DIAGNOSIS — E559 Vitamin D deficiency, unspecified: Secondary | ICD-10-CM

## 2022-10-16 DIAGNOSIS — Z6830 Body mass index (BMI) 30.0-30.9, adult: Secondary | ICD-10-CM

## 2022-10-16 DIAGNOSIS — I10 Essential (primary) hypertension: Secondary | ICD-10-CM

## 2022-10-16 DIAGNOSIS — Z87891 Personal history of nicotine dependence: Secondary | ICD-10-CM

## 2022-10-16 DIAGNOSIS — M5440 Lumbago with sciatica, unspecified side: Secondary | ICD-10-CM

## 2022-10-16 LAB — BAYER DCA HB A1C WAIVED: HB A1C (BAYER DCA - WAIVED): 6.9 % — ABNORMAL HIGH (ref 4.8–5.6)

## 2022-10-16 MED ORDER — ROSUVASTATIN CALCIUM 10 MG PO TABS: 10.0000 mg | ORAL_TABLET | Freq: Every day | ORAL | 1 refills | Status: DC

## 2022-10-16 MED ORDER — LOSARTAN POTASSIUM 100 MG PO TABS: ORAL_TABLET | ORAL | 1 refills | Status: DC

## 2022-10-16 MED ORDER — SPIRONOLACTONE 25 MG PO TABS: ORAL_TABLET | ORAL | 3 refills | Status: DC

## 2022-10-16 MED ORDER — APIXABAN 5 MG PO TABS: ORAL_TABLET | ORAL | 1 refills | Status: DC

## 2022-10-16 MED ORDER — SITAGLIPTIN PHOSPHATE 100 MG PO TABS: ORAL_TABLET | ORAL | 1 refills | Status: DC

## 2022-10-16 MED ORDER — AMLODIPINE BESYLATE 10 MG PO TABS: 10.0000 mg | ORAL_TABLET | Freq: Every day | ORAL | 1 refills | Status: DC

## 2022-10-16 MED ORDER — AMIODARONE HCL 200 MG PO TABS: 100.0000 mg | ORAL_TABLET | Freq: Every day | ORAL | 1 refills | Status: DC

## 2022-10-16 MED ORDER — METFORMIN HCL 1000 MG PO TABS: ORAL_TABLET | ORAL | 1 refills | Status: DC

## 2022-10-16 NOTE — Patient Instructions (Signed)

## 2022-10-16 NOTE — Progress Notes (Signed)
Subjective:    Patient ID: Michael Valenzuela, male    DOB: [birth year], 66 y.o.   MRN: [id number]   Chief Complaint: Medical Management of Chronic Issues    HPI:  Michael Valenzuela is a 66 y.o. who identifies as a male who was assigned male at birth.   Social history: Lives with: wife Work history: disability   Comes in today for follow up of the following chronic medical issues:  1. Essential hypertension, benign No c/o chest pain, sob or headache. Dies not check blood pressure at home very often. BP Readings from Last 3 Encounters:  10/16/22 135/79  08/06/22 134/85  05/08/22 (!) 142/87     2. Type 2 diabetes mellitus with other specified complication, without long-term current use of insulin (HCC) Fasting blood sugars are running 130-150. Denies any low blood sugars Lab Results  Component Value Date   HGBA1C 6.9 (H) 04/15/2022     3. Hyperlipidemia with target LDL less than 100 Does not watch diet and does no dedicated exercise. Lab Results  Component Value Date   CHOL 132 04/15/2022   HDL 41 04/15/2022   LDLCALC 51 04/15/2022   TRIG 254 (H) 04/15/2022   CHOLHDL 3.2 04/15/2022     4. Paroxysmal atrial fibrillation (HCC) Denies palpitations or heart racing. Is on pacerone for rate control. Eliquis as well , with no bleeding issues.  5. Fibromyalgia 6. Chronic midline low back pain with sciatica, sciatica laterality unspecified See pain management monthly. He says he still hurts daily.  7. Vitamin D deficiency Is on daily vitamin d supplement  8. Hx of tobacco use, presenting hazards to health Smokes over a pack a day. He has not had a low dose CT scan.  9. BMI 30.0-30.9,adult No recent weight changes Wt Readings from Last 3 Encounters:  10/16/22 202 lb (91.6 kg)  08/06/22 207 lb 6.4 oz (94.1 kg)  04/15/22 207 lb 9.6 oz (94.2 kg)   BMI Readings from Last 3 Encounters:  10/16/22 28.17 kg/m  08/06/22 28.93 kg/m  04/15/22 28.95 kg/m     New  complaints: None today  No Known Allergies Outpatient Encounter Medications as of 10/16/2022  Medication Sig   amiodarone (PACERONE) 200 MG tablet Take 0.5 tablets (100 mg total) by mouth daily.   cholecalciferol (VITAMIN D3) 25 MCG (1000 UNIT) tablet Take 1,000 Units by mouth daily.   cyclobenzaprine (FLEXERIL) 10 MG tablet TAKE 1 TABLET(10 MG) BY MOUTH THREE TIMES DAILY AS NEEDED FOR MUSCLE SPASMS   ELIQUIS 5 MG TABS tablet TAKE 1 TABLET(5 MG) BY MOUTH TWICE DAILY   glucose blood (TRUETRACK TEST) test strip USE TO TEST BLOOD SUGAR ONCE DAILY   losartan (COZAAR) 100 MG tablet TAKE 1 TABLET(100 MG) BY MOUTH DAILY   Menthol-Camphor (ICY HOT ADVANCED PAIN RELIEF) 16-11 % CREA Apply 1 application topically daily as needed (pain).   metFORMIN (GLUCOPHAGE) 1000 MG tablet TAKE 1 TABLET(1000 MG) BY MOUTH TWICE DAILY WITH A MEAL   Multiple Vitamins-Minerals (MULTIVITAMIN WITH MINERALS) tablet Take 1 tablet by mouth daily.   oxyCODONE (ROXICODONE) 15 MG immediate release tablet Take 15 mg by mouth 5 (five) times daily.   rosuvastatin (CRESTOR) 10 MG tablet Take 1 tablet (10 mg total) by mouth daily.   sitaGLIPtin (JANUVIA) 100 MG tablet TAKE 1 TABLET(100 MG) BY MOUTH DAILY   spironolactone (ALDACTONE) 25 MG tablet TAKE 1/2 TABLET(12.5 MG) BY MOUTH DAILY   Turmeric Curcumin 500 MG CAPS Take 500 mg by mouth daily.  amLODipine (NORVASC) 10 MG tablet Take 1 tablet (10 mg total) by mouth daily.   No facility-administered encounter medications on file as of 10/16/2022.    Past Surgical History:  Procedure Laterality Date   CARDIOVERSION N/A 05/17/2021   Procedure: CARDIOVERSION;  Surgeon: Fay Records, MD;  Location: [facility];  Service: Cardiovascular;  Laterality: N/A;   HERNIA REPAIR     MAZE N/A 03/08/2021   Procedure: MAZE;  Surgeon: Wonda Olds, MD;  Location: [place];  Service: Open Heart Surgery;  Laterality: N/A;   MITRAL VALVE REPAIR N/A 03/08/2021   Procedure: MITRAL VALVE REPAIR USING   PHYSIO RING SIZE 28 (EXPLANTED) THEN MITRAL VALVE REPLACEMENT (MVR) WITH MAGNA EASE MITRAL VALVE SIZE 25MM  ON PUMP, LEFT ATRIAL APPENDAGE CLIPPING USING A SIZE 50 ATRIAL CLIP;  Surgeon: Wonda Olds, MD;  Location: [place];  Service: Open Heart Surgery;  Laterality: N/A;   RIGHT/LEFT HEART CATH AND CORONARY ANGIOGRAPHY N/A 03/05/2021   Procedure: RIGHT/LEFT HEART CATH AND CORONARY ANGIOGRAPHY;  Surgeon: Leonie Man, MD;  Location: [place] [facility];  Service: Cardiovascular;  Laterality: N/A;   TEE WITHOUT CARDIOVERSION N/A 03/05/2021   Procedure: TRANSESOPHAGEAL ECHOCARDIOGRAM (TEE);  Surgeon: Donato Heinz, MD;  Location: [place];  Service: Cardiovascular;  Laterality: N/A;   TEE WITHOUT CARDIOVERSION N/A 03/08/2021   Procedure: TRANSESOPHAGEAL ECHOCARDIOGRAM (TEE);  Surgeon: Wonda Olds, MD;  Location: [place];  Service: Open Heart Surgery;  Laterality: N/A;   TEE WITHOUT CARDIOVERSION N/A 05/17/2021   Procedure: TRANSESOPHAGEAL ECHOCARDIOGRAM (TEE);  Surgeon: Fay Records, MD;  Location: [facility];  Service: Cardiovascular;  Laterality: N/A;    Family History  Problem Relation Age of Onset   Heart disease Mother    Diabetes Mother    Cancer Mother    Heart disease Father    Diabetes Son 20       type 1   Diabetes Paternal Uncle    Heart attack Brother    Hypertension Son       Controlled substance contract: n/a     Review of Systems  Constitutional:  Negative for diaphoresis.  Eyes:  Negative for pain.  Respiratory:  Negative for shortness of breath.   Cardiovascular:  Negative for chest pain, palpitations and leg swelling.  Gastrointestinal:  Negative for abdominal pain.  Endocrine: Negative for polydipsia.  Skin:  Negative for rash.  Neurological:  Negative for dizziness, weakness and headaches.  Hematological:  Does not bruise/bleed easily.  All other systems reviewed and are negative.      Objective:   Physical Exam Vitals and nursing note  reviewed.  Constitutional:      Appearance: Normal appearance. He is well-developed.  HENT:     Head: Normocephalic.     Nose: Nose normal.     Mouth/Throat:     Mouth: Mucous membranes are moist.     Pharynx: Oropharynx is clear.  Eyes:     Pupils: Pupils are equal, round, and reactive to light.  Neck:     Thyroid: No thyroid mass or thyromegaly.     Vascular: No carotid bruit or JVD.     Trachea: Phonation normal.  Cardiovascular:     Rate and Rhythm: Normal rate and regular rhythm.  Pulmonary:     Effort: Pulmonary effort is normal. No respiratory distress.     Breath sounds: Normal breath sounds.  Abdominal:     General: Bowel sounds are normal.     Palpations: Abdomen  is soft.     Tenderness: There is no abdominal tenderness.  Musculoskeletal:        General: Normal range of motion.     Cervical back: Normal range of motion and neck supple.  Lymphadenopathy:     Cervical: No cervical adenopathy.  Skin:    General: Skin is warm and dry.  Neurological:     Mental Status: He is alert and oriented to person, place, and time.  Psychiatric:        Behavior: Behavior normal.        Thought Content: Thought content normal.        Judgment: Judgment normal.    BP 135/79   Pulse (!) 58   Temp 97.8 F (36.6 C) (Temporal)   Resp 20   Ht _0  (1.803 m)   Wt 202 lb (91.6 kg)   SpO2 96%   BMI 28.17 kg/m    Hgba1c 6.9%     Assessment & Plan:  Michael Valenzuela comes in today with chief complaint of Medical Management of Chronic Issues   Diagnosis and orders addressed:  1. Essential hypertension, benign Low sodium diet - CBC with Differential/Platelet - CMP14+EGFR - spironolactone (ALDACTONE) 25 MG tablet; TAKE 1/2 TABLET(12.5 MG) BY MOUTH DAILY  Dispense: 45 tablet; Refill: 3 - amLODipine (NORVASC) 10 MG tablet; Take 1 tablet (10 mg total) by mouth daily.  Dispense: 90 tablet; Refill: 1 - losartan (COZAAR) 100 MG tablet; TAKE 1 TABLET(100 MG) BY MOUTH DAILY   Dispense: 90 tablet; Refill: 1  2. Type 2 diabetes mellitus with other specified complication, without long-term current use of insulin (HCC) Continue to wtahc carbs in diet - Bayer DCA Hb A1c Waived - metFORMIN (GLUCOPHAGE) 1000 MG tablet; TAKE 1 TABLET(1000 MG) BY MOUTH TWICE DAILY WITH A MEAL  Dispense: 180 tablet; Refill: 1 - sitaGLIPtin (JANUVIA) 100 MG tablet; TAKE 1 TABLET(100 MG) BY MOUTH DAILY  Dispense: 90 tablet; Refill: 1  3. Hyperlipidemia with target LDL less than 100 Low fta diet - Lipid panel - rosuvastatin (CRESTOR) 10 MG tablet; Take 1 tablet (10 mg total) by mouth daily.  Dispense: 90 tablet; Refill: 1  4. Paroxysmal atrial fibrillation (HCC) Report heart racing - apixaban (ELIQUIS) 5 MG TABS tablet; TAKE 1 TABLET(5 MG) BY MOUTH TWICE DAILY  Dispense: 180 tablet; Refill: 1 - amiodarone (PACERONE) 200 MG tablet; Take 0.5 tablets (100 mg total) by mouth daily.  Dispense: 45 tablet; Refill: 1  5. Fibromyalgia Keep follow up at pain clinic - T4, Free  6. Chronic midline low back pain with sciatica, sciatica laterality unspecified Keep follow up at pain clinic  7. Vitamin D deficiency Continue daily vitamin d supplement  8. Hx of tobacco use, presenting hazards to health Smoking cessation encouraged Refuses low dose ct scan  9. BMI 30.0-30.9,adult Discussed diet and exercise for person with BMI >25 Will recheck weight in 3-6 months    Labs pending Health Maintenance reviewed Diet and exercise encouraged  Follow up plan: 6 months   [name] Done, FNP

## 2022-10-17 DIAGNOSIS — Z23 Encounter for immunization: Secondary | ICD-10-CM | POA: Diagnosis not present

## 2022-10-17 LAB — CBC WITH DIFFERENTIAL/PLATELET
Basophils Absolute: 0.1 10*3/uL (ref 0.0–0.2)
Basos: 1 %
EOS (ABSOLUTE): 0.6 10*3/uL — ABNORMAL HIGH (ref 0.0–0.4)
Eos: 8 %
Hematocrit: 43.2 % (ref 37.5–51.0)
Hemoglobin: 14.8 g/dL (ref 13.0–17.7)
Immature Grans (Abs): 0 10*3/uL (ref 0.0–0.1)
Immature Granulocytes: 0 %
Lymphocytes Absolute: 1.5 10*3/uL (ref 0.7–3.1)
Lymphs: 21 %
MCH: 30.5 pg (ref 26.6–33.0)
MCHC: 34.3 g/dL (ref 31.5–35.7)
MCV: 89 fL (ref 79–97)
Monocytes Absolute: 0.6 10*3/uL (ref 0.1–0.9)
Monocytes: 8 %
Neutrophils Absolute: 4.4 10*3/uL (ref 1.4–7.0)
Neutrophils: 62 %
Platelets: 214 10*3/uL (ref 150–450)
RBC: 4.86 x10E6/uL (ref 4.14–5.80)
RDW: 12.7 % (ref 11.6–15.4)
WBC: 7.1 10*3/uL (ref 3.4–10.8)

## 2022-10-17 LAB — T4, FREE: Free T4: 1.95 ng/dL — ABNORMAL HIGH (ref 0.82–1.77)

## 2022-10-17 LAB — LIPID PANEL
Chol/HDL Ratio: 3.2 ratio (ref 0.0–5.0)
Cholesterol, Total: 117 mg/dL (ref 100–199)
HDL: 37 mg/dL — ABNORMAL LOW (ref >39)
LDL Chol Calc (NIH): 47 mg/dL (ref 0–99)
Triglycerides: 202 mg/dL — ABNORMAL HIGH (ref 0–149)
VLDL Cholesterol Cal: 33 mg/dL (ref 5–40)

## 2022-10-17 LAB — CMP14+EGFR
ALT: 48 IU/L — ABNORMAL HIGH (ref 0–44)
AST: 50 IU/L — ABNORMAL HIGH (ref 0–40)
Albumin/Globulin Ratio: 1.7 (ref 1.2–2.2)
Albumin: 4.6 g/dL (ref 3.9–4.9)
Alkaline Phosphatase: 72 IU/L (ref 44–121)
BUN/Creatinine Ratio: 18 (ref 10–24)
BUN: 15 mg/dL (ref 8–27)
Bilirubin Total: 0.5 mg/dL (ref 0.0–1.2)
CO2: 19 mmol/L — ABNORMAL LOW (ref 20–29)
Calcium: 9.8 mg/dL (ref 8.6–10.2)
Chloride: 101 mmol/L (ref 96–106)
Creatinine, Ser: 0.82 mg/dL (ref 0.76–1.27)
Globulin, Total: 2.7 g/dL (ref 1.5–4.5)
Glucose: 159 mg/dL — ABNORMAL HIGH (ref 70–99)
Potassium: 4.4 mmol/L (ref 3.5–5.2)
Sodium: 137 mmol/L (ref 134–144)
Total Protein: 7.3 g/dL (ref 6.0–8.5)
eGFR: 96 mL/min/{1.73_m2} (ref >59)

## 2022-10-20 ENCOUNTER — Other Ambulatory Visit: Payer: Self-pay | Admitting: Nurse Practitioner

## 2022-10-20 DIAGNOSIS — G8929 Other chronic pain: Secondary | ICD-10-CM

## 2022-10-21 NOTE — Telephone Encounter (Signed)
Last office visit 10/16/22 Last refill 09/16/22, #30, no refills

## 2022-10-27 ENCOUNTER — Encounter: Payer: Self-pay | Admitting: Cardiology

## 2022-10-27 ENCOUNTER — Ambulatory Visit: Payer: Medicare Other | Attending: Cardiology | Admitting: Cardiology

## 2022-10-27 VITALS — BP 138/80 | HR 56 | Ht 71.5 in | Wt 200.8 lb

## 2022-10-27 DIAGNOSIS — Z79899 Other long term (current) drug therapy: Secondary | ICD-10-CM | POA: Diagnosis not present

## 2022-10-27 DIAGNOSIS — I480 Paroxysmal atrial fibrillation: Secondary | ICD-10-CM | POA: Diagnosis not present

## 2022-10-27 DIAGNOSIS — Z9889 Other specified postprocedural states: Secondary | ICD-10-CM | POA: Diagnosis not present

## 2022-10-27 DIAGNOSIS — I10 Essential (primary) hypertension: Secondary | ICD-10-CM | POA: Diagnosis not present

## 2022-10-27 MED ORDER — HYDRALAZINE HCL 25 MG PO TABS: 25.0000 mg | ORAL_TABLET | Freq: Two times a day (BID) | ORAL | 6 refills | Status: DC

## 2022-10-27 NOTE — Patient Instructions (Signed)
Medication Instructions:  Begin Hydralazine '25mg'$  twice a day   Continue all other medications.     Labwork: TSH, free T4, CMET - orders given today  Please do in 3 months (around the end of March) Office will contact with results via phone, letter or mychart.     Testing/Procedures: none  Follow-Up: 6 months   Any Other Special Instructions Will Be Listed Below (If Applicable).   If you need a refill on your cardiac medications before your next appointment, please call your pharmacy.

## 2022-10-27 NOTE — Progress Notes (Signed)
Clinical Summary Michael Valenzuela is a 66 y.o.male seen today for follow up of the following medical problems.   Mitral regurgitation s/p MVR - admission with diagnosis of severe MR 02/2021 - 03/08/2021 MVR with magna ease 28m tissue valve along with LAA clipping and MAZE procedure - 04/2021 echo MVR mean gradient 10 mmHg.      05/2021 TEE: LVEF 55-60%, MV leaflets thin without restricted motion and no obstruction. Mean grade 5 mmHg, mild MR    - no SOB/DOE, no recent edema       2. Afib/aflutter - newly diagnosed during 02/2021 admission - s/p LAA clipping and MAZE procedure during his MVR surgery - has been on amio. LAVI is 44   05/17/2021 DCCV - no recent palpitations - compliant with meds, taking amio '200mg'$  daily.  - no bleeding on elqius. -LFTs slightly LFTs. TFTs normal last year, upcoming labs.    -last visit lowered amio to '100mg'$  daily, had mild elevation of LFTs - repeat labs showed LFTs improving,   - no recent palpitations.  - no bleeding on eliquis   3. HTN    - home bp's 130s-140s/80s-90s - wants to avoid diuretic, already has chronic frequent urination.    4. Hyperlipidemia   04/2022 TC 132 TG 254 HDL 41 LDL 51 10/2022 TC 117 TG 202 HDL 37LDL 47   5. Chronic pain - chronic fibromylagia, followed by pain clinic. On long term opiates.  Past Medical History:  Diagnosis Date   Arthritis    Chronic low back pain    Diabetes mellitus without complication (HCC)    Fibromyalgia    Hyperlipidemia    Hypertension    Vitamin D deficiency      No Known Allergies   Current Outpatient Medications  Medication Sig Dispense Refill   amiodarone (PACERONE) 200 MG tablet Take 0.5 tablets (100 mg total) by mouth daily. 45 tablet 1   amLODipine (NORVASC) 10 MG tablet Take 1 tablet (10 mg total) by mouth daily. 90 tablet 1   apixaban (ELIQUIS) 5 MG TABS tablet TAKE 1 TABLET(5 MG) BY MOUTH TWICE DAILY 180 tablet 1   cholecalciferol (VITAMIN D3) 25 MCG (1000 UNIT)  tablet Take 1,000 Units by mouth daily.     cyclobenzaprine (FLEXERIL) 10 MG tablet TAKE 1 TABLET(10 MG) BY MOUTH THREE TIMES DAILY AS NEEDED FOR MUSCLE SPASMS 30 tablet 0   glucose blood (TRUETRACK TEST) test strip USE TO TEST BLOOD SUGAR ONCE DAILY 100 strip 3   losartan (COZAAR) 100 MG tablet TAKE 1 TABLET(100 MG) BY MOUTH DAILY 90 tablet 1   Menthol-Camphor (ICY HOT ADVANCED PAIN RELIEF) 16-11 % CREA Apply 1 application topically daily as needed (pain).     metFORMIN (GLUCOPHAGE) 1000 MG tablet TAKE 1 TABLET(1000 MG) BY MOUTH TWICE DAILY WITH A MEAL 180 tablet 1   Multiple Vitamins-Minerals (MULTIVITAMIN WITH MINERALS) tablet Take 1 tablet by mouth daily.     oxyCODONE (ROXICODONE) 15 MG immediate release tablet Take 15 mg by mouth 5 (five) times daily.     rosuvastatin (CRESTOR) 10 MG tablet Take 1 tablet (10 mg total) by mouth daily. 90 tablet 1   sitaGLIPtin (JANUVIA) 100 MG tablet TAKE 1 TABLET(100 MG) BY MOUTH DAILY 90 tablet 1   spironolactone (ALDACTONE) 25 MG tablet TAKE 1/2 TABLET(12.5 MG) BY MOUTH DAILY 45 tablet 3   Turmeric Curcumin 500 MG CAPS Take 500 mg by mouth daily.     No current facility-administered medications for  this visit.     Past Surgical History:  Procedure Laterality Date   CARDIOVERSION N/A 05/17/2021   Procedure: CARDIOVERSION;  Surgeon: Fay Records, MD;  Location: [facility];  Service: Cardiovascular;  Laterality: N/A;   HERNIA REPAIR     MAZE N/A 03/08/2021   Procedure: MAZE;  Surgeon: Wonda Olds, MD;  Location: [place];  Service: Open Heart Surgery;  Laterality: N/A;   MITRAL VALVE REPAIR N/A 03/08/2021   Procedure: MITRAL VALVE REPAIR USING  PHYSIO RING SIZE 28 (EXPLANTED) THEN MITRAL VALVE REPLACEMENT (MVR) WITH MAGNA EASE MITRAL VALVE SIZE 25MM  ON PUMP, LEFT ATRIAL APPENDAGE CLIPPING USING A SIZE 50 ATRIAL CLIP;  Surgeon: Wonda Olds, MD;  Location: [place];  Service: Open Heart Surgery;  Laterality: N/A;   RIGHT/LEFT HEART CATH AND CORONARY  ANGIOGRAPHY N/A 03/05/2021   Procedure: RIGHT/LEFT HEART CATH AND CORONARY ANGIOGRAPHY;  Surgeon: Leonie Man, MD;  Location: [facility];  Service: Cardiovascular;  Laterality: N/A;   TEE WITHOUT CARDIOVERSION N/A 03/05/2021   Procedure: TRANSESOPHAGEAL ECHOCARDIOGRAM (TEE);  Surgeon: Donato Heinz, MD;  Location: [place];  Service: Cardiovascular;  Laterality: N/A;   TEE WITHOUT CARDIOVERSION N/A 03/08/2021   Procedure: TRANSESOPHAGEAL ECHOCARDIOGRAM (TEE);  Surgeon: Wonda Olds, MD;  Location: [place];  Service: Open Heart Surgery;  Laterality: N/A;   TEE WITHOUT CARDIOVERSION N/A 05/17/2021   Procedure: TRANSESOPHAGEAL ECHOCARDIOGRAM (TEE);  Surgeon: Fay Records, MD;  Location: [facility];  Service: Cardiovascular;  Laterality: N/A;     No Known Allergies    Family History  Problem Relation Age of Onset   Heart disease Mother    Diabetes Mother    Cancer Mother    Heart disease Father    Diabetes Son 75       type 1   Diabetes Paternal Uncle    Heart attack Brother    Hypertension Son      Social History Michael Valenzuela reports that he quit smoking about 3 years ago. His smoking use included cigarettes. He has a 10.00 pack-year smoking history. He has never been exposed to tobacco smoke. He has never used smokeless tobacco. Michael Valenzuela reports current alcohol use.   Review of Systems CONSTITUTIONAL: No weight loss, fever, chills, weakness or fatigue.  HEENT: Eyes: No visual loss, blurred vision, double vision or yellow sclerae.No hearing loss, sneezing, congestion, runny nose or sore throat.  SKIN: No rash or itching.  CARDIOVASCULAR: per hpi RESPIRATORY: No shortness of breath, cough or sputum.  GASTROINTESTINAL: No anorexia, nausea, vomiting or diarrhea. No abdominal pain or blood.  GENITOURINARY: No burning on urination, no polyuria NEUROLOGICAL: No headache, dizziness, syncope, paralysis, ataxia, numbness or tingling in the extremities. No change in  bowel or bladder control.  MUSCULOSKELETAL: chronic MSK pain LYMPHATICS: No enlarged nodes. No history of splenectomy.  PSYCHIATRIC: No history of depression or anxiety.  ENDOCRINOLOGIC: No reports of sweating, cold or heat intolerance. No polyuria or polydipsia.  Marland Kitchen   Physical Examination Today's Vitals   10/27/22 0848  BP: 138/80  Pulse: (!) 56  SpO2: 95%  Weight: 200 lb 12.8 oz (91.1 kg)  Height: 5' 11.5" (1.816 m)   Body mass index is 27.62 kg/m.  Gen: resting comfortably, no acute distress HEENT: no scleral icterus, pupils equal round and reactive, no palptable cervical adenopathy,  CV: RRR, no mrg, no jvd Resp: Clear to auscultation bilaterally GI: abdomen is soft, non-tender, non-distended, normal bowel sounds, no hepatosplenomegaly MSK: extremities  are warm, no edema.  Skin: warm, no rash Neuro:  no focal deficits Psych: appropriate affect   Diagnostic Studies  TEE 03/05/21: IMPRESSIONS   1. Left ventricular ejection fraction, by estimation, is 60 to 65%. The  left ventricle has normal function.   2. Right ventricular systolic function is normal. The right ventricular  size is mildly enlarged.   3. Left atrial size was severely dilated. No left atrial/left atrial  appendage thrombus was detected.   4. Right atrial size was mildly dilated.   5. The aortic valve is tricuspid. Aortic valve regurgitation is not  visualized.   6. The mitral valve is abnormal. Flail P2. Severe mitral valve  regurgitation. MR severe by both 2D ERO and 3D VCA. Systolic flow reversal  in pulmonary veins, consistent with severe MR.   Cath 03/05/21: The left ventricular systolic function is normal. The left ventricular ejection fraction is 55-65% by visual estimate. LV end diastolic pressure is mildly elevated. There is moderate (3+) mitral regurgitation. Hemodynamic findings consistent with mild pulmonary hypertension. Angiographically normal coronary arteries    SUMMARY Angiographically Normal Coronary Arteries Mild Pulmonary Hypertension with mean PAP 30 mmHg, PCWP 5 mmHg with LVEDP 15 mmHg. Mild-moderately reduced Cardiac Output-Cardiac Index (Fick) 4.44-2.12.       05/2021 TEE IMPRESSIONS     1. Left ventricular ejection fraction, by estimation, is 55 to 60%. The  left ventricle has normal function.   2. Right ventricular systolic function is normal. The right ventricular  size is normal.   3. LA appendage has been clipped surgically No flow seen.   4. 25 mm Magna ease Mitral valve present (03/08/21) Valve is well seated.  Leaflets thin without significant restricted motion. No obstruction seen.  Peak and mean gradients through the valve are 13 and 5 mm Hg respectively  MVA by PT1/2 is 1.44 cm2. By  planimetry from 3 D images were 1.45 cm2. (HR 65bpm). Mild mitral valve  regurgitation.      Assessment and Plan   Severe MR s/p bioprosthetic MVR - valve has looked good on follow up imaging - no symptoms, cotninue to monitor   2. Afib/aflutter - remains in [place] since DCCV 05/2021, he remains on amio and eliquis - mild uptrend in LFTs that is improving, we had lowered amio dose. Free T4 mildly elevated, repeat TSH and free T4 3 months   3. HTN - above goal - avoiding diuretic due to chronic issues already with frequent urination. Low heart rates avoid av nodal agents. On max CCB, ARB, aldactone - start hydral '25mg'$  bid      F/u 6 months   Arnoldo Lenis, M.D.

## 2022-12-01 ENCOUNTER — Other Ambulatory Visit: Payer: Self-pay | Admitting: Nurse Practitioner

## 2022-12-01 DIAGNOSIS — G8929 Other chronic pain: Secondary | ICD-10-CM

## 2023-01-28 ENCOUNTER — Encounter: Payer: Self-pay | Admitting: Cardiology

## 2023-01-28 ENCOUNTER — Other Ambulatory Visit: Payer: Self-pay | Admitting: *Deleted

## 2023-01-28 MED ORDER — HYDRALAZINE HCL 25 MG PO TABS: 25.0000 mg | ORAL_TABLET | Freq: Two times a day (BID) | ORAL | 2 refills | Status: DC

## 2023-02-05 ENCOUNTER — Other Ambulatory Visit: Payer: Self-pay

## 2023-02-05 DIAGNOSIS — I10 Essential (primary) hypertension: Secondary | ICD-10-CM

## 2023-02-05 DIAGNOSIS — I480 Paroxysmal atrial fibrillation: Secondary | ICD-10-CM

## 2023-02-05 DIAGNOSIS — E1169 Type 2 diabetes mellitus with other specified complication: Secondary | ICD-10-CM

## 2023-02-05 DIAGNOSIS — G8929 Other chronic pain: Secondary | ICD-10-CM

## 2023-02-05 DIAGNOSIS — E785 Hyperlipidemia, unspecified: Secondary | ICD-10-CM

## 2023-02-05 MED ORDER — CYCLOBENZAPRINE HCL 10 MG PO TABS: ORAL_TABLET | ORAL | 0 refills | Status: DC

## 2023-02-05 MED ORDER — AMIODARONE HCL 200 MG PO TABS: 100.0000 mg | ORAL_TABLET | Freq: Every day | ORAL | 1 refills | Status: DC

## 2023-02-05 MED ORDER — METFORMIN HCL 1000 MG PO TABS: ORAL_TABLET | ORAL | 1 refills | Status: DC

## 2023-02-05 MED ORDER — TRUETRACK TEST VI STRP: ORAL_STRIP | 3 refills | Status: DC

## 2023-02-05 MED ORDER — LOSARTAN POTASSIUM 100 MG PO TABS: ORAL_TABLET | ORAL | 1 refills | Status: DC

## 2023-02-05 MED ORDER — AMLODIPINE BESYLATE 10 MG PO TABS: 10.0000 mg | ORAL_TABLET | Freq: Every day | ORAL | 1 refills | Status: DC

## 2023-02-05 MED ORDER — APIXABAN 5 MG PO TABS: ORAL_TABLET | ORAL | 1 refills | Status: DC

## 2023-02-05 MED ORDER — SITAGLIPTIN PHOSPHATE 100 MG PO TABS: ORAL_TABLET | ORAL | 1 refills | Status: DC

## 2023-02-05 MED ORDER — SPIRONOLACTONE 25 MG PO TABS: ORAL_TABLET | ORAL | 3 refills | Status: DC

## 2023-02-05 MED ORDER — ROSUVASTATIN CALCIUM 10 MG PO TABS: 10.0000 mg | ORAL_TABLET | Freq: Every day | ORAL | 1 refills | Status: DC

## 2023-02-05 NOTE — Telephone Encounter (Signed)
All meds sent to [facility] in [place] per patients request

## 2023-02-13 ENCOUNTER — Telehealth: Payer: Self-pay | Admitting: Nurse Practitioner

## 2023-02-13 NOTE — Telephone Encounter (Signed)
Contacted Michael Valenzuela to schedule their annual wellness visit. Appointment made for 02/23/2023.  Thank you,  Judeth Cornfield,  AMB Clinical Support [facility] Direct Dial ??[phone]

## 2023-02-22 NOTE — Progress Notes (Unsigned)
Subjective:   Michael Valenzuela is a 67 y.o. male who presents for Medicare Annual/Subsequent preventive examination.  Review of Systems    ***       Objective:    There were no vitals filed for this visit. There is no height or weight on file to calculate BMI.     02/12/2022    9:45 AM 05/17/2021   10:59 AM 05/15/2021    1:33 PM 03/15/2021    7:41 AM 03/08/2021    4:00 PM 03/01/2021    6:44 PM 03/01/2021    1:11 PM  Advanced Directives  Does Patient Have a Medical Advance Directive? No No No No No  No  Would patient like information on creating a medical advance directive? No - Patient declined No - Patient declined No - Patient declined  No - Patient declined No - Patient declined     Current Medications (verified) Outpatient Encounter Medications as of 02/23/2023  Medication Sig   amiodarone (PACERONE) 200 MG tablet Take 0.5 tablets (100 mg total) by mouth daily.   amLODipine (NORVASC) 10 MG tablet Take 1 tablet (10 mg total) by mouth daily.   apixaban (ELIQUIS) 5 MG TABS tablet TAKE 1 TABLET(5 MG) BY MOUTH TWICE DAILY   cholecalciferol (VITAMIN D3) 25 MCG (1000 UNIT) tablet Take 1,000 Units by mouth daily.   cyclobenzaprine (FLEXERIL) 10 MG tablet TAKE 1 TABLET(10 MG) BY MOUTH THREE TIMES DAILY AS NEEDED FOR MUSCLE SPASMS   glucose blood (TRUETRACK TEST) test strip USE TO TEST BLOOD SUGAR ONCE DAILY   hydrALAZINE (APRESOLINE) 25 MG tablet Take 1 tablet (25 mg total) by mouth 2 (two) times daily.   losartan (COZAAR) 100 MG tablet TAKE 1 TABLET(100 MG) BY MOUTH DAILY   Menthol-Camphor (ICY HOT ADVANCED PAIN RELIEF) 16-11 % CREA Apply 1 application topically daily as needed (pain).   metFORMIN (GLUCOPHAGE) 1000 MG tablet TAKE 1 TABLET(1000 MG) BY MOUTH TWICE DAILY WITH A MEAL   Multiple Vitamins-Minerals (MULTIVITAMIN WITH MINERALS) tablet Take 1 tablet by mouth daily.   oxyCODONE (ROXICODONE) 15 MG immediate release tablet Take 15 mg by mouth 5 (five) times daily. (Patient not taking:  Reported on 10/27/2022)   Oxycodone HCl 20 MG TABS Take 1 tablet by mouth every 6 (six) hours.   rosuvastatin (CRESTOR) 10 MG tablet Take 1 tablet (10 mg total) by mouth daily.   sitaGLIPtin (JANUVIA) 100 MG tablet TAKE 1 TABLET(100 MG) BY MOUTH DAILY   spironolactone (ALDACTONE) 25 MG tablet TAKE 1/2 TABLET(12.5 MG) BY MOUTH DAILY   Turmeric Curcumin 500 MG CAPS Take 500 mg by mouth daily.   No facility-administered encounter medications on file as of 02/23/2023.    Allergies (verified) Patient has no known allergies.   History: Past Medical History:  Diagnosis Date   Arthritis    Chronic low back pain    Diabetes mellitus without complication (HCC)    Fibromyalgia    Hyperlipidemia    Hypertension    Vitamin D deficiency    Past Surgical History:  Procedure Laterality Date   CARDIOVERSION N/A 05/17/2021   Procedure: CARDIOVERSION;  Surgeon: Pricilla Riffle, MD;  Location: [facility];  Service: Cardiovascular;  Laterality: N/A;   HERNIA REPAIR     MAZE N/A 03/08/2021   Procedure: MAZE;  Surgeon: Linden Dolin, MD;  Location: [facility] OR;  Service: Open Heart Surgery;  Laterality: N/A;   MITRAL VALVE REPAIR N/A 03/08/2021   Procedure: MITRAL VALVE REPAIR USING  PHYSIO RING SIZE  28 (EXPLANTED) THEN MITRAL VALVE REPLACEMENT (MVR) WITH MAGNA EASE MITRAL VALVE SIZE  ON PUMP, LEFT ATRIAL APPENDAGE CLIPPING USING A SIZE 50 ATRIAL CLIP;  Surgeon: Linden Dolin, MD;  Location: [facility] OR;  Service: Open Heart Surgery;  Laterality: N/A;   RIGHT/LEFT HEART CATH AND CORONARY ANGIOGRAPHY N/A 03/05/2021   Procedure: RIGHT/LEFT HEART CATH AND CORONARY ANGIOGRAPHY;  Surgeon: Marykay Lex, MD;  Location: [facility] INVASIVE CV LAB;  Service: Cardiovascular;  Laterality: N/A;   TEE WITHOUT CARDIOVERSION N/A 03/05/2021   Procedure: TRANSESOPHAGEAL ECHOCARDIOGRAM (TEE);  Surgeon: Little Ishikawa, MD;  Location: [facility] ENDOSCOPY;  Service: Cardiovascular;  Laterality: N/A;   TEE WITHOUT CARDIOVERSION N/A  03/08/2021   Procedure: TRANSESOPHAGEAL ECHOCARDIOGRAM (TEE);  Surgeon: Linden Dolin, MD;  Location: [facility] OR;  Service: Open Heart Surgery;  Laterality: N/A;   TEE WITHOUT CARDIOVERSION N/A 05/17/2021   Procedure: TRANSESOPHAGEAL ECHOCARDIOGRAM (TEE);  Surgeon: Pricilla Riffle, MD;  Location: [facility];  Service: Cardiovascular;  Laterality: N/A;   Family History  Problem Relation Age of Onset   Heart disease Mother    Diabetes Mother    Cancer Mother    Heart disease Father    Diabetes Son 93       type 1   Diabetes Paternal Uncle    Heart attack Brother    Hypertension Son    Social History   Socioeconomic History   Marital status: Married    Spouse name: Lupita Leash   Number of children: 2   Years of education: 13   Highest education level: Some college, no degree  Occupational History   Occupation: Retired    Associate Professor: [employer]   Occupation: disabled  Tobacco Use   Smoking status: Some Days    Packs/day: 0.25    Years: 20.00    Additional pack years: 0.00    Total pack years: 5.00    Types: Cigarettes    Last attempt to quit: 02/09/2019    Years since quitting: 4.0    Passive exposure: Never   Smokeless tobacco: Never  Vaping Use   Vaping Use: Never used  Substance and Sexual Activity   Alcohol use: Not Currently    Comment: occasional   Drug use: No   Sexual activity: Not Currently  Other Topics Concern   Not on file  Social History Narrative   Patient has fibromyalgia and has significant pain with that. He has two adult children and is married. He has one grandchild.   Both sons live next door    Social Determinants of Health   Financial Resource Strain: Low Risk  (02/12/2022)   Overall Financial Resource Strain (CARDIA)    Difficulty of Paying Living Expenses: Not hard at all  Food Insecurity: No Food Insecurity (02/12/2022)   Hunger Vital Sign    Worried About Running Out of Food in the Last Year: Never true    Ran Out of Food in the Last Year: Never true   Transportation Needs: No Transportation Needs (02/12/2022)   PRAPARE - Administrator, Civil Service (Medical): No    Lack of Transportation (Non-Medical): No  Physical Activity: Insufficiently Active (02/12/2022)   Exercise Vital Sign    Days of Exercise per Week: 3 days    Minutes of Exercise per Session: 40 min  Stress: No Stress Concern Present (02/12/2022)   [employer] of Occupational Health - Occupational Stress Questionnaire    Feeling of Stress : Not at all  Social Connections:  Moderately Isolated (02/12/2022)   Social Connection and Isolation Panel [NHANES]    Frequency of Communication with Friends and Family: More than three times a week    Frequency of Social Gatherings with Friends and Family: More than three times a week    Attends Religious Services: Never    Database administrator or Organizations: No    Attends Banker Meetings: Never    Marital Status: Married    Tobacco Counseling Ready to quit: Not Answered Counseling given: Not Answered   Clinical Intake:                 Diabetic?Yes   Nutrition Risk Assessment:  Has the patient had any N/V/D within the last 2 months?  {YES/NO:21197} Does the patient have any non-healing wounds?  {YES/NO:21197} Has the patient had any unintentional weight loss or weight gain?  {YES/NO:21197}  Diabetes:  Is the patient diabetic?  {YES/NO:21197} If diabetic, was a CBG obtained today?  {YES/NO:21197} Did the patient bring in their glucometer from home?  {YES/NO:21197} How often do you monitor your CBG's? ***.   Financial Strains and Diabetes Management:  Are you having any financial strains with the device, your supplies or your medication? {YES/NO:21197}.  Does the patient want to be seen by Chronic Care Management for management of their diabetes?  {YES/NO:21197} Would the patient like to be referred to a Nutritionist or for Diabetic Management?  {YES/NO:21197}  Diabetic  Exams:  {Diabetic Eye Exam:2101801} Diabetic Foot Exam: Completed 10/16/22          Activities of Daily Living     No data to display          Patient Care Team: Bennie Pierini, FNP as PCP - General (Nurse Practitioner) Antoine Poche, MD as PCP - Cardiology (Cardiology) [facility], [facility] (Pain Medicine)  Indicate any recent Medical Services you may have received from other than [facility] providers in the past year (date may be approximate).     Assessment:   This is a routine wellness examination for Michael Valenzuela.  Hearing/Vision screen No results found.  Dietary issues and exercise activities discussed:     Goals Addressed   None    Depression Screen    10/16/2022   11:16 AM 10/16/2022   11:06 AM 04/15/2022   11:02 AM 02/12/2022    9:42 AM 10/21/2021   11:43 AM 07/22/2021   11:25 AM 05/16/2021   11:56 AM  PHQ 2/9 Scores  PHQ - 2 Score 0 0 0  PHQ- 9 Score 0 0     Fall Risk    10/16/2022   11:16 AM 10/16/2022   11:06 AM 04/15/2022   11:02 AM 02/12/2022    9:38 AM 10/21/2021   11:43 AM  Fall Risk   Falls in the past year? 0 0 0 0 0  Number falls in past yr:   0 0   Injury with Fall?   0 0   Risk for fall due to :   No Fall Risks Orthopedic patient   Follow up   Falls evaluation completed Falls prevention discussed     FALL RISK PREVENTION PERTAINING TO THE HOME:  Any stairs in or around the home? {YES/NO:21197} If so, are there any without handrails? {YES/NO:21197} Home free of loose throw rugs in walkways, pet beds, electrical cords, etc? {YES/NO:21197} Adequate lighting in your home to reduce risk of falls? {YES/NO:21197}  ASSISTIVE DEVICES UTILIZED  TO PREVENT FALLS:  Life alert? {YES/NO:21197} Use of a cane, walker or w/c? {YES/NO:21197} Grab bars in the bathroom? {YES/NO:21197} Shower chair or bench in shower? {YES/NO:21197} Elevated toilet seat or a handicapped toilet? {YES/NO:21197}  TIMED UP AND GO:  Was the test  performed? No . Telephonic visit   Cognitive Function:    01/24/2019    2:13 PM 12/31/2017    9:24 AM  MMSE - Mini Mental State Exam  Orientation to time 5 5  Orientation to Place 5 5  Registration 3 3  Attention/ Calculation 4 4  Recall 2 2  Language- name 2 objects 2 2  Language- repeat 1 1  Language- follow 3 step command 3 3  Language- read & follow direction 1 1  Write a sentence 1 1  Copy design 1 0  Total score 28 27        02/12/2022    9:52 AM 02/09/2020    8:46 AM  6CIT Screen  What Year? 0 points 0 points  What month? 0 points 0 points  What time? 0 points 0 points  Count back from 20 0 points 0 points  Months in reverse 2 points 0 points  Repeat phrase 2 points 0 points  Total Score 4 points 0 points    Immunizations Immunization History  Administered Date(s) Administered   Fluad Quad(high Dose 65+) 09/27/2020, 10/21/2021, 10/17/2022   Influenza,inj,Quad PF,6+ Mos 10/06/2016, 10/08/2017, 10/11/2018, 07/15/2019   PFIZER(Purple Top)SARS-COV-2 Vaccination 02/23/2020, 03/15/2020, 11/09/2020   Pneumococcal Conjugate-13 12/31/2015, 04/15/2022   Pneumococcal Polysaccharide-23 01/06/2017   Tdap 03/19/2015   Tetanus 06/25/2004   Zoster Recombinat (Shingrix) 07/22/2021   Zoster, Live 09/25/2015    TDAP status: Up to date  Flu Vaccine status: Up to date  Pneumococcal vaccine status: Up to date  Covid-19 vaccine status: Information provided on how to obtain vaccines.   Qualifies for Shingles Vaccine? Yes   Zostavax completed Yes   Shingrix Completed?: No.    Education has been provided regarding the importance of this vaccine. Patient has been advised to call insurance company to determine out of pocket expense if they have not yet received this vaccine. Advised may also receive vaccine at local pharmacy or Health Dept. Verbalized acceptance and understanding.  Screening Tests Health Maintenance  Topic Date Due   Zoster Vaccines- Shingrix (2 of 2) 09/16/2021    COLONOSCOPY (Pts 45-82yrs Insurance coverage will need to be confirmed)  03/17/2022   COVID-19 Vaccine (4 - 2023-24 season) 07/11/2022   Diabetic kidney evaluation - Urine ACR  07/22/2022   OPHTHALMOLOGY EXAM  12/26/2022   Medicare Annual Wellness (AWV)  02/13/2023   Pneumonia Vaccine 79+ Years old (3 of 3 - PPSV23 or PCV20) 04/16/2023   HEMOGLOBIN A1C  04/17/2023   INFLUENZA VACCINE  06/11/2023   Diabetic kidney evaluation - eGFR measurement  10/17/2023   FOOT EXAM  10/17/2023   DTaP/Tdap/Td (2 - Td or Tdap) 03/18/2025   Hepatitis C Screening  Completed   HPV VACCINES  Aged Out    Health Maintenance  Health Maintenance Due  Topic Date Due   Zoster Vaccines- Shingrix (2 of 2) 09/16/2021   COLONOSCOPY (Pts 45-22yrs Insurance coverage will need to be confirmed)  03/17/2022   COVID-19 Vaccine (4 - 2023-24 season) 07/11/2022   Diabetic kidney evaluation - Urine ACR  07/22/2022   OPHTHALMOLOGY EXAM  12/26/2022   Medicare Annual Wellness (AWV)  02/13/2023   Pneumonia Vaccine 69+ Years old (3 of 3 - PPSV23 or  PCV20) 04/16/2023    Colorectal cancer screening: Type of screening: {Colorectal Screening Types:24994}. Completed ***. Repeat every *** years  Lung Cancer Screening: (Low Dose CT Chest recommended if Age 108-80 years, 30 pack-year currently smoking OR have quit w/in 15years.) does not qualify.   Lung Cancer Screening Referral: n/a  Additional Screening:  Hepatitis C Screening: does qualify; Completed 06/21/15  Vision Screening: Recommended annual ophthalmology exams for early detection of glaucoma and other disorders of the eye. Is the patient up to date with their annual eye exam?  {YES/NO:21197} Who is the provider or what is the name of the office in which the patient attends annual eye exams? *** If pt is not established with a provider, would they like to be referred to a provider to establish care? {YES/NO:21197}.   Dental Screening: Recommended annual dental exams  for proper oral hygiene  Community Resource Referral / Chronic Care Management: CRR required this visit?  {YES/NO:21197}  CCM required this visit?  {YES/NO:21197}     Plan:     I have personally reviewed and noted the following in the patient's chart:   Medical and social history Use of alcohol, tobacco or illicit drugs  Current medications and supplements including opioid prescriptions. {Opioid [phone]} Functional ability and status Nutritional status Physical activity Advanced directives List of other physicians Hospitalizations, surgeries, and ER visits in previous 12 months Vitals Screenings to include cognitive, depression, and falls Referrals and appointments  In addition, I have reviewed and discussed with patient certain preventive protocols, quality metrics, and best practice recommendations. A written personalized care plan for preventive services as well as general preventive health recommendations were provided to patient.     Durwin Nora, [place]   1/61/0960   Due to this being a virtual visit, the after visit summary with patients personalized plan was offered to patient via mail or my-chart. ***Patient declined at this time./ Patient would like to access on my-chart/ per request, patient was mailed a copy of AVS./ Patient preferred to pick up at office at next visit  Nurse Notes: ***

## 2023-02-22 NOTE — Patient Instructions (Signed)
Michael Valenzuela , Thank you for taking time to come for your Medicare Wellness Visit. I appreciate your ongoing commitment to your health goals. Please review the following plan we discussed and let me know if I can assist you in the future.   These are the goals we discussed:  Goals      Exercise 150 min/wk Moderate Activity     02/12/2022 AWV Goal: Exercise for General Health  Patient will verbalize understanding of the benefits of increased physical activity: Exercising regularly is important. It will improve your overall fitness, flexibility, and endurance. Regular exercise also will improve your overall health. It can help you control your weight, reduce stress, and improve your bone density. Over the next year, patient will increase physical activity as tolerated with a goal of at least 150 minutes of moderate physical activity per week.  You can tell that you are exercising at a moderate intensity if your heart starts beating faster and you start breathing faster but can still hold a conversation. Moderate-intensity exercise ideas include: Walking 1 mile (1.6 km) in about 15 minutes Biking Hiking Golfing Dancing Water aerobics Patient will verbalize understanding of everyday activities that increase physical activity by providing examples like the following: Yard work, such as: Insurance underwriter Gardening Washing windows or floors Patient will be able to explain general safety guidelines for exercising:  Before you start a new exercise program, talk with your health care provider. Do not exercise so much that you hurt yourself, feel dizzy, or get very short of breath. Wear comfortable clothes and wear shoes with good support. Drink plenty of water while you exercise to prevent dehydration or heat stroke. Work out until your breathing and your heartbeat get faster.      Patient Stated     Get control  of pain        This is a list of the screening recommended for you and due dates:  Health Maintenance  Topic Date Due   Zoster (Shingles) Vaccine (2 of 2) 09/16/2021   Colon Cancer Screening  03/17/2022   COVID-19 Vaccine (4 - 2023-24 season) 07/11/2022   Yearly kidney health urinalysis for diabetes  07/22/2022   Eye exam for diabetics  12/26/2022   Pneumonia Vaccine (3 of 3 - PPSV23 or PCV20) 04/16/2023   Hemoglobin A1C  04/17/2023   Flu Shot  06/11/2023   Yearly kidney function blood test for diabetes  10/17/2023   Complete foot exam   10/17/2023   Medicare Annual Wellness Visit  68/15/2025   DTaP/Tdap/Td vaccine (2 - Td or Tdap) 03/18/2025   Hepatitis C Screening: USPSTF Recommendation to screen - Ages 12-79 yo.  Completed   HPV Vaccine  Aged Out    Advanced directives: Forms are available if you choose in the future to pursue completion.  This is recommended in order to make sure that your health wishes are honored in the event that you are unable to verbalize them to the provider.    Conditions/risks identified: Aim for 30 minutes of exercise or brisk walking, 6-8 glasses of water, and 5 servings of fruits and vegetables each day.   Next appointment: Follow up in one year for your annual wellness visit.   Preventive Care 68 Years and Older, Male  Preventive care refers to lifestyle choices and visits with your health care provider that can promote health and wellness. What does preventive care include? A  yearly physical exam. This is also called an annual well check. Dental exams once or twice a year. Routine eye exams. Ask your health care provider how often you should have your eyes checked. Personal lifestyle choices, including: Daily care of your teeth and gums. Regular physical activity. Eating a healthy diet. Avoiding tobacco and drug use. Limiting alcohol use. Practicing safe sex. Taking low doses of aspirin every day. Taking vitamin and mineral supplements as  recommended by your health care provider. What happens during an annual well check? The services and screenings done by your health care provider during your annual well check will depend on your age, overall health, lifestyle risk factors, and family history of disease. Counseling  Your health care provider may ask you questions about your: Alcohol use. Tobacco use. Drug use. Emotional well-being. Home and relationship well-being. Sexual activity. Eating habits. History of falls. Memory and ability to understand (cognition). Work and work Astronomer. Screening  You may have the following tests or measurements: Height, weight, and BMI. Blood pressure. Lipid and cholesterol levels. These may be checked every 5 years, or more frequently if you are over 77 years old. Skin check. Lung cancer screening. You may have this screening every year starting at age 29 if you have a 30-pack-year history of smoking and currently smoke or have quit within the past 15 years. Fecal occult blood test (FOBT) of the stool. You may have this test every year starting at age 47. Flexible sigmoidoscopy or colonoscopy. You may have a sigmoidoscopy every 5 years or a colonoscopy every 10 years starting at age 49. Prostate cancer screening. Recommendations will vary depending on your family history and other risks. Hepatitis C blood test. Hepatitis B blood test. Sexually transmitted disease (STD) testing. Diabetes screening. This is done by checking your blood sugar (glucose) after you have not eaten for a while (fasting). You may have this done every 1-3 years. Abdominal aortic aneurysm (AAA) screening. You may need this if you are a current or former smoker. Osteoporosis. You may be screened starting at age 6 if you are at high risk. Talk with your health care provider about your test results, treatment options, and if necessary, the need for more tests. Vaccines  Your health care provider may recommend  certain vaccines, such as: Influenza vaccine. This is recommended every year. Tetanus, diphtheria, and acellular pertussis (Tdap, Td) vaccine. You may need a Td booster every 10 years. Zoster vaccine. You may need this after age 41. Pneumococcal 13-valent conjugate (PCV13) vaccine. One dose is recommended after age 36. Pneumococcal polysaccharide (PPSV23) vaccine. One dose is recommended after age 32. Talk to your health care provider about which screenings and vaccines you need and how often you need them. This information is not intended to replace advice given to you by your health care provider. Make sure you discuss any questions you have with your health care provider. Document Released: 11/23/2015 Document Revised: 07/16/2016 Document Reviewed: 08/28/2015 Elsevier Interactive Patient Education  2017 [employer].  Fall Prevention in the Home Falls can cause injuries. They can happen to people of all ages. There are many things you can do to make your home safe and to help prevent falls. What can I do on the outside of my home? Regularly fix the edges of walkways and driveways and fix any cracks. Remove anything that might make you trip as you walk through a door, such as a raised step or threshold. Trim any bushes or trees on the path  to your home. Use bright outdoor lighting. Clear any walking paths of anything that might make someone trip, such as rocks or tools. Regularly check to see if handrails are loose or broken. Make sure that both sides of any steps have handrails. Any raised decks and porches should have guardrails on the edges. Have any leaves, snow, or ice cleared regularly. Use sand or salt on walking paths during winter. Clean up any spills in your garage right away. This includes oil or grease spills. What can I do in the bathroom? Use night lights. Install grab bars by the toilet and in the tub and shower. Do not use towel bars as grab bars. Use non-skid mats or  decals in the tub or shower. If you need to sit down in the shower, use a plastic, non-slip stool. Keep the floor dry. Clean up any water that spills on the floor as soon as it happens. Remove soap buildup in the tub or shower regularly. Attach bath mats securely with double-sided non-slip rug tape. Do not have throw rugs and other things on the floor that can make you trip. What can I do in the bedroom? Use night lights. Make sure that you have a light by your bed that is easy to reach. Do not use any sheets or blankets that are too big for your bed. They should not hang down onto the floor. Have a firm chair that has side arms. You can use this for support while you get dressed. Do not have throw rugs and other things on the floor that can make you trip. What can I do in the kitchen? Clean up any spills right away. Avoid walking on wet floors. Keep items that you use a lot in easy-to-reach places. If you need to reach something above you, use a strong step stool that has a grab bar. Keep electrical cords out of the way. Do not use floor polish or wax that makes floors slippery. If you must use wax, use non-skid floor wax. Do not have throw rugs and other things on the floor that can make you trip. What can I do with my stairs? Do not leave any items on the stairs. Make sure that there are handrails on both sides of the stairs and use them. Fix handrails that are broken or loose. Make sure that handrails are as long as the stairways. Check any carpeting to make sure that it is firmly attached to the stairs. Fix any carpet that is loose or worn. Avoid having throw rugs at the top or bottom of the stairs. If you do have throw rugs, attach them to the floor with carpet tape. Make sure that you have a light switch at the top of the stairs and the bottom of the stairs. If you do not have them, ask someone to add them for you. What else can I do to help prevent falls? Wear shoes that: Do not  have high heels. Have rubber bottoms. Are comfortable and fit you well. Are closed at the toe. Do not wear sandals. If you use a stepladder: Make sure that it is fully opened. Do not climb a closed stepladder. Make sure that both sides of the stepladder are locked into place. Ask someone to hold it for you, if possible. Clearly mark and make sure that you can see: Any grab bars or handrails. First and last steps. Where the edge of each step is. Use tools that help you move around (mobility aids)  if they are needed. These include: Canes. Walkers. Scooters. Crutches. Turn on the lights when you go into a dark area. Replace any light bulbs as soon as they burn out. Set up your furniture so you have a clear path. Avoid moving your furniture around. If any of your floors are uneven, fix them. If there are any pets around you, be aware of where they are. Review your medicines with your doctor. Some medicines can make you feel dizzy. This can increase your chance of falling. Ask your doctor what other things that you can do to help prevent falls. This information is not intended to replace advice given to you by your health care provider. Make sure you discuss any questions you have with your health care provider. Document Released: 08/23/2009 Document Revised: 04/03/2016 Document Reviewed: 12/01/2014 Elsevier Interactive Patient Education  2017 [employer].

## 2023-02-23 ENCOUNTER — Ambulatory Visit (INDEPENDENT_AMBULATORY_CARE_PROVIDER_SITE_OTHER): Payer: Medicare Other

## 2023-02-23 VITALS — Ht 71.5 in | Wt 200.0 lb

## 2023-02-23 DIAGNOSIS — Z0000 Encounter for general adult medical examination without abnormal findings: Secondary | ICD-10-CM | POA: Diagnosis not present

## 2023-02-23 DIAGNOSIS — Z1211 Encounter for screening for malignant neoplasm of colon: Secondary | ICD-10-CM

## 2023-02-23 LAB — HM DIABETES EYE EXAM

## 2023-03-16 ENCOUNTER — Other Ambulatory Visit: Payer: Self-pay | Admitting: Nurse Practitioner

## 2023-03-16 DIAGNOSIS — E1169 Type 2 diabetes mellitus with other specified complication: Secondary | ICD-10-CM

## 2023-03-16 DIAGNOSIS — E785 Hyperlipidemia, unspecified: Secondary | ICD-10-CM

## 2023-03-16 DIAGNOSIS — I10 Essential (primary) hypertension: Secondary | ICD-10-CM

## 2023-04-21 ENCOUNTER — Encounter: Payer: Self-pay | Admitting: Nurse Practitioner

## 2023-04-21 ENCOUNTER — Ambulatory Visit (INDEPENDENT_AMBULATORY_CARE_PROVIDER_SITE_OTHER): Payer: Medicare Other | Admitting: Nurse Practitioner

## 2023-04-21 VITALS — BP 142/79 | HR 65 | Temp 98.0°F | Resp 20 | Ht 71.0 in | Wt 198.0 lb

## 2023-04-21 DIAGNOSIS — E785 Hyperlipidemia, unspecified: Secondary | ICD-10-CM | POA: Diagnosis not present

## 2023-04-21 DIAGNOSIS — I5033 Acute on chronic diastolic (congestive) heart failure: Secondary | ICD-10-CM

## 2023-04-21 DIAGNOSIS — I10 Essential (primary) hypertension: Secondary | ICD-10-CM

## 2023-04-21 DIAGNOSIS — Z6830 Body mass index (BMI) 30.0-30.9, adult: Secondary | ICD-10-CM

## 2023-04-21 DIAGNOSIS — I340 Nonrheumatic mitral (valve) insufficiency: Secondary | ICD-10-CM

## 2023-04-21 DIAGNOSIS — E119 Type 2 diabetes mellitus without complications: Secondary | ICD-10-CM

## 2023-04-21 DIAGNOSIS — I051 Rheumatic mitral insufficiency: Secondary | ICD-10-CM

## 2023-04-21 DIAGNOSIS — Z87891 Personal history of nicotine dependence: Secondary | ICD-10-CM

## 2023-04-21 DIAGNOSIS — M797 Fibromyalgia: Secondary | ICD-10-CM

## 2023-04-21 DIAGNOSIS — M5440 Lumbago with sciatica, unspecified side: Secondary | ICD-10-CM

## 2023-04-21 DIAGNOSIS — Z23 Encounter for immunization: Secondary | ICD-10-CM

## 2023-04-21 DIAGNOSIS — R232 Flushing: Secondary | ICD-10-CM

## 2023-04-21 DIAGNOSIS — G8929 Other chronic pain: Secondary | ICD-10-CM

## 2023-04-21 DIAGNOSIS — Z7984 Long term (current) use of oral hypoglycemic drugs: Secondary | ICD-10-CM

## 2023-04-21 DIAGNOSIS — E559 Vitamin D deficiency, unspecified: Secondary | ICD-10-CM

## 2023-04-21 DIAGNOSIS — I480 Paroxysmal atrial fibrillation: Secondary | ICD-10-CM

## 2023-04-21 LAB — BAYER DCA HB A1C WAIVED: HB A1C (BAYER DCA - WAIVED): 6.5 % — ABNORMAL HIGH (ref 4.8–5.6)

## 2023-04-21 NOTE — Progress Notes (Addendum)
Subjective:    Patient ID: Michael Valenzuela, male    DOB: May 11, 1956, 67 y.o.   MRN: [id number]   Chief Complaint: Medical Management of Chronic Issues    HPI:  Michael Valenzuela is a 67 y.o. who identifies as a male who was assigned male at birth.   Social history: Lives with: wife Work history: disability   Comes in today for follow up of the following chronic medical issues:  1. Essential hypertension, benign Denies chest pain, sob, or headaches. Occasionally checks BP at home. BP Readings from Last 3 Encounters:  04/21/23 (!) 142/79  10/27/22 138/80  10/16/22 135/79    2. Hyperlipidemia with target LDL less than 100 Tries to watch his diet; no dedicated exercise. Lab Results  Component Value Date   CHOL 117 10/16/2022   HDL 37 (L) 10/16/2022   LDLCALC 47 10/16/2022   TRIG 202 (H) 10/16/2022   CHOLHDL 3.2 10/16/2022   The ASCVD Risk score (Arnett DK, et al., 2019) failed to calculate for the following reasons:   The valid total cholesterol range is 130 to 320 mg/dL  3. Diabetes mellitus treated with oral medication (HCC) Fasting blood sugars usually between 120-130; no low blood sugars. Lab Results  Component Value Date   HGBA1C 6.9 (H) 10/16/2022    4. Acute on chronic heart failure with preserved ejection fraction (HFpEF)/in the setting of severe mitral valve prolapse 5. Paroxysmal atrial fibrillation (HCC) 6. Rheumatic mitral regurgitation 7. Severe mitral regurgitation Denies chest pain or palpitations; remains on pacerone and eliquis. Last saw cardiology on 10/27/22; was started on hydralazine 25mg ; plan for 6 month f/u which will be this month.  8. Fibromyalgia 9. Chronic midline low back pain with sciatica, sciatica laterality unspecified Still has pain daily but sees pain management for both.  10. Hx of tobacco use, presenting hazards to health Currently trying to quit; only smoking 1-2 cigarettes every few days.  11. Vitamin D deficiency Daily Vitamin D  supplement.  12. BMI 30.0-30.9,adult Weight is down 2 lbs since last check. Wt Readings from Last 3 Encounters:  04/21/23 198 lb (89.8 kg)  02/23/23 200 lb (90.7 kg)  10/27/22 200 lb 12.8 oz (91.1 kg)   BMI Readings from Last 3 Encounters:  04/21/23 27.62 kg/m  02/23/23 27.51 kg/m  10/27/22 27.62 kg/m       New complaints: None today  No Known Allergies Outpatient Encounter Medications as of 04/21/2023  Medication Sig   amiodarone (PACERONE) 200 MG tablet Take 0.5 tablets (100 mg total) by mouth daily.   amLODipine (NORVASC) 10 MG tablet Take 1 tablet (10 mg total) by mouth daily.   apixaban (ELIQUIS) 5 MG TABS tablet TAKE 1 TABLET(5 MG) BY MOUTH TWICE DAILY   cyclobenzaprine (FLEXERIL) 10 MG tablet TAKE 1 TABLET(10 MG) BY MOUTH THREE TIMES DAILY AS NEEDED FOR MUSCLE SPASMS   glucose blood (TRUETRACK TEST) test strip USE TO TEST BLOOD SUGAR ONCE DAILY   hydrALAZINE (APRESOLINE) 25 MG tablet Take 1 tablet (25 mg total) by mouth 2 (two) times daily.   losartan (COZAAR) 100 MG tablet TAKE 1 TABLET(100 MG) BY MOUTH DAILY   Menthol-Camphor (ICY HOT ADVANCED PAIN RELIEF) 16-11 % CREA Apply 1 application topically daily as needed (pain).   metFORMIN (GLUCOPHAGE) 1000 MG tablet TAKE 1 TABLET(1000 MG) BY MOUTH TWICE DAILY WITH A MEAL   Multiple Vitamins-Minerals (MULTIVITAMIN WITH MINERALS) tablet Take 1 tablet by mouth daily.   Oxycodone HCl 20 MG TABS Take 1 tablet by  mouth every 6 (six) hours.   rosuvastatin (CRESTOR) 10 MG tablet Take 1 tablet (10 mg total) by mouth daily.   sitaGLIPtin (JANUVIA) 100 MG tablet TAKE 1 TABLET(100 MG) BY MOUTH DAILY   spironolactone (ALDACTONE) 25 MG tablet TAKE 1/2 TABLET(12.5 MG) BY MOUTH DAILY   Turmeric Curcumin 500 MG CAPS Take 500 mg by mouth daily.   [DISCONTINUED] cholecalciferol (VITAMIN D3) 25 MCG (1000 UNIT) tablet Take 1,000 Units by mouth daily.   No facility-administered encounter medications on file as of 04/21/2023.    Past  Surgical History:  Procedure Laterality Date   CARDIOVERSION N/A 05/17/2021   Procedure: CARDIOVERSION;  Surgeon: Pricilla Riffle, MD;  Location: [facility];  Service: Cardiovascular;  Laterality: N/A;   HERNIA REPAIR     MAZE N/A 03/08/2021   Procedure: MAZE;  Surgeon: Linden Dolin, MD;  Location: [facility] OR;  Service: Open Heart Surgery;  Laterality: N/A;   MITRAL VALVE REPAIR N/A 03/08/2021   Procedure: MITRAL VALVE REPAIR USING  PHYSIO RING SIZE 28 (EXPLANTED) THEN MITRAL VALVE REPLACEMENT (MVR) WITH MAGNA EASE MITRAL VALVE SIZE  ON PUMP, LEFT ATRIAL APPENDAGE CLIPPING USING A SIZE 50 ATRIAL CLIP;  Surgeon: Linden Dolin, MD;  Location: [facility] OR;  Service: Open Heart Surgery;  Laterality: N/A;   RIGHT/LEFT HEART CATH AND CORONARY ANGIOGRAPHY N/A 03/05/2021   Procedure: RIGHT/LEFT HEART CATH AND CORONARY ANGIOGRAPHY;  Surgeon: Marykay Lex, MD;  Location: [facility] INVASIVE CV LAB;  Service: Cardiovascular;  Laterality: N/A;   TEE WITHOUT CARDIOVERSION N/A 03/05/2021   Procedure: TRANSESOPHAGEAL ECHOCARDIOGRAM (TEE);  Surgeon: Little Ishikawa, MD;  Location: [facility] ENDOSCOPY;  Service: Cardiovascular;  Laterality: N/A;   TEE WITHOUT CARDIOVERSION N/A 03/08/2021   Procedure: TRANSESOPHAGEAL ECHOCARDIOGRAM (TEE);  Surgeon: Linden Dolin, MD;  Location: [facility] OR;  Service: Open Heart Surgery;  Laterality: N/A;   TEE WITHOUT CARDIOVERSION N/A 05/17/2021   Procedure: TRANSESOPHAGEAL ECHOCARDIOGRAM (TEE);  Surgeon: Pricilla Riffle, MD;  Location: [facility];  Service: Cardiovascular;  Laterality: N/A;    Family History  Problem Relation Age of Onset   Heart disease Mother    Diabetes Mother    Cancer Mother    Heart disease Father    Diabetes Son 76       type 1   Diabetes Paternal Uncle    Heart attack Brother    Hypertension Son       Controlled substance contract: n/a     Review of Systems  Constitutional:  Positive for fatigue. Negative for activity change and appetite change.  HENT:   Negative for ear pain, hearing loss, sinus pressure and trouble swallowing.   Eyes:  Negative for visual disturbance.  Respiratory:  Negative for chest tightness and shortness of breath.   Cardiovascular:  Negative for chest pain, palpitations and leg swelling.  Gastrointestinal:  Negative for abdominal pain, blood in stool, constipation and diarrhea.  Endocrine: Negative for cold intolerance, polydipsia, polyphagia and polyuria.       Hot flashes  Genitourinary:  Negative for decreased urine volume, difficulty urinating, dysuria and urgency.  Musculoskeletal:  Positive for back pain. Negative for joint swelling.  Skin:  Negative for rash.  Neurological:  Negative for dizziness, weakness and headaches.  Hematological:  Negative for adenopathy. Does not bruise/bleed easily.       Objective:   Physical Exam Vitals and nursing note reviewed.  Constitutional:      General: He is not in acute distress.    Appearance:  Normal appearance. He is not ill-appearing.  HENT:     Head: Normocephalic and atraumatic.     Right Ear: Tympanic membrane normal.     Left Ear: Tympanic membrane normal.     Nose: Nose normal.     Mouth/Throat:     Mouth: Mucous membranes are moist.     Pharynx: Oropharynx is clear.  Eyes:     Extraocular Movements: Extraocular movements intact.     Conjunctiva/sclera: Conjunctivae normal.     Pupils: Pupils are equal, round, and reactive to light.  Neck:     Vascular: No carotid bruit.  Cardiovascular:     Rate and Rhythm: Normal rate and regular rhythm.     Pulses: Normal pulses.     Heart sounds: Normal heart sounds. No murmur heard. Pulmonary:     Effort: Pulmonary effort is normal. No respiratory distress.     Breath sounds: Normal breath sounds. No wheezing, rhonchi or rales.  Abdominal:     General: Bowel sounds are normal. There is no distension.     Palpations: Abdomen is soft. There is no mass.     Tenderness: There is no abdominal tenderness.   Musculoskeletal:        General: Normal range of motion.     Cervical back: Normal range of motion. No tenderness.     Right lower leg: No edema.     Left lower leg: No edema.  Lymphadenopathy:     Cervical: No cervical adenopathy.  Skin:    General: Skin is warm and dry.     Capillary Refill: Capillary refill takes less than 2 seconds.     Findings: No rash.  Neurological:     General: No focal deficit present.     Mental Status: He is alert and oriented to person, place, and time.  Psychiatric:        Mood and Affect: Mood normal.        Behavior: Behavior normal.      BP (!) 142/79   Pulse 65   Temp 98 F (36.7 C) (Temporal)   Resp 20   Ht 5\' 11"  (1.803 m)   Wt 198 lb (89.8 kg)   SpO2 96%   BMI 27.62 kg/m       Assessment & Plan:   Michael Valenzuela comes in today with chief complaint of Medical Management of Chronic Issues   Diagnosis and orders addressed:  1. Essential hypertension, benign Low sodium diet - CBC with Differential/Platelet - CMP14+EGFR  2. Hyperlipidemia with target LDL less than 100 Low fat diet - Lipid panel  3. Diabetes mellitus treated with oral medication (HCC) Continue to watch carb intake - Bayer DCA Hb A1c Waived - Microalbumin / creatinine urine ratio  4. Acute on chronic heart failure with preserved ejection fraction (HFpEF)/in the setting of severe mitral valve prolapse 5. Paroxysmal atrial fibrillation (HCC) 7. Severe mitral regurgitation Keep f/u with cardiology; report any new symptoms.  8. Fibromyalgia 9. Chronic midline low back pain with sciatica, sciatica laterality unspecified Keep appointments with pain management clinic  10. Hx of tobacco use, presenting hazards to health Continue to work on smoking cessation  11. Vitamin D deficiency Continue daily supplement  12. BMI 30.0-30.9,adult Discussed diet and exercise for person with BMI >25 Will recheck in 3-6 months  13. Hot flashes Labs pending - Thyroid  Panel With TSH   Labs pending Health Maintenance reviewed Diet and exercise encouraged  Follow up plan: 6 months  Joni Reining  Means, FNP student    Mary-Margaret Daphine Deutscher, FNP

## 2023-04-21 NOTE — Patient Instructions (Signed)
Fall Prevention in the Home, Adult Falls can cause injuries and can happen to people of all ages. There are many things you can do to make your home safer and to help prevent falls. What actions can I take to prevent falls? General information Use good lighting in all rooms. Make sure to: Replace any light bulbs that burn out. Turn on the lights in dark areas and use night-lights. Keep items that you use often in easy-to-reach places. Lower the shelves around your home if needed. Move furniture so that there are clear paths around it. Do not use throw rugs or other things on the floor that can make you trip. If any of your floors are uneven, fix them. Add color or contrast paint or tape to clearly mark and help you see: Grab bars or handrails. First and last steps of staircases. Where the edge of each step is. If you use a ladder or stepladder: Make sure that it is fully opened. Do not climb a closed ladder. Make sure the sides of the ladder are locked in place. Have someone hold the ladder while you use it. Know where your pets are as you move through your home. What can I do in the bathroom?     Keep the floor dry. Clean up any water on the floor right away. Remove soap buildup in the bathtub or shower. Buildup makes bathtubs and showers slippery. Use non-skid mats or decals on the floor of the bathtub or shower. Attach bath mats securely with double-sided, non-slip rug tape. If you need to sit down in the shower, use a non-slip stool. Install grab bars by the toilet and in the bathtub and shower. Do not use towel bars as grab bars. What can I do in the bedroom? Make sure that you have a light by your bed that is easy to reach. Do not use any sheets or blankets on your bed that hang to the floor. Have a firm chair or bench with side arms that you can use for support when you get dressed. What can I do in the kitchen? Clean up any spills right away. If you need to reach something  above you, use a step stool with a grab bar. Keep electrical cords out of the way. Do not use floor polish or wax that makes floors slippery. What can I do with my stairs? Do not leave anything on the stairs. Make sure that you have a light switch at the top and the bottom of the stairs. Make sure that there are handrails on both sides of the stairs. Fix handrails that are broken or loose. Install non-slip stair treads on all your stairs if they do not have carpet. Avoid having throw rugs at the top or bottom of the stairs. Choose a carpet that does not hide the edge of the steps on the stairs. Make sure that the carpet is firmly attached to the stairs. Fix carpet that is loose or worn. What can I do on the outside of my home? Use bright outdoor lighting. Fix the edges of walkways and driveways and fix any cracks. Clear paths of anything that can make you trip, such as tools or rocks. Add color or contrast paint or tape to clearly mark and help you see anything that might make you trip as you walk through a door, such as a raised step or threshold. Trim any bushes or trees on paths to your home. Check to see if handrails are loose   or broken and that both sides of all steps have handrails. Install guardrails along the edges of any raised decks and porches. Have leaves, snow, or ice cleared regularly. Use sand, salt, or ice melter on paths if you live where there is ice and snow during the winter. Clean up any spills in your garage right away. This includes grease or oil spills. What other actions can I take? Review your medicines with your doctor. Some medicines can cause dizziness or changes in blood pressure, which increase your risk of falling. Wear shoes that: Have a low heel. Do not wear high heels. Have rubber bottoms and are closed at the toe. Feel good on your feet and fit well. Use tools that help you move around if needed. These include: Canes. Walkers. Scooters. Crutches. Ask  your doctor what else you can do to help prevent falls. This may include seeing a physical therapist to learn to do exercises to move better and get stronger. Where to find more information Centers for Disease Control and Prevention, [employer]: cdc.gov National Institute on Aging: nia.nih.gov National Institute on Aging: nia.nih.gov Contact a doctor if: You are afraid of falling at home. You feel weak, drowsy, or dizzy at home. You fall at home. Get help right away if you: Lose consciousness or have trouble moving after a fall. Have a fall that causes a head injury. These symptoms may be an emergency. Get help right away. Call 911. Do not wait to see if the symptoms will go away. Do not drive yourself to the hospital. This information is not intended to replace advice given to you by your health care provider. Make sure you discuss any questions you have with your health care provider. Document Revised: 06/30/2022 Document Reviewed: 06/30/2022 Elsevier Patient Education  2024 Elsevier Inc.  

## 2023-04-22 LAB — LIPID PANEL
Chol/HDL Ratio: 3.1 ratio (ref 0.0–5.0)
Cholesterol, Total: 137 mg/dL (ref 100–199)
HDL: 44 mg/dL (ref >39)
LDL Chol Calc (NIH): 55 mg/dL (ref 0–99)
Triglycerides: 238 mg/dL — ABNORMAL HIGH (ref 0–149)
VLDL Cholesterol Cal: 38 mg/dL (ref 5–40)

## 2023-04-22 LAB — CBC WITH DIFFERENTIAL/PLATELET
Basophils Absolute: 0.1 10*3/uL (ref 0.0–0.2)
Basos: 1 %
EOS (ABSOLUTE): 0.4 10*3/uL (ref 0.0–0.4)
Eos: 5 %
Hematocrit: 45.2 % (ref 37.5–51.0)
Hemoglobin: 15.6 g/dL (ref 13.0–17.7)
Immature Grans (Abs): 0 10*3/uL (ref 0.0–0.1)
Immature Granulocytes: 0 %
Lymphocytes Absolute: 1.3 10*3/uL (ref 0.7–3.1)
Lymphs: 17 %
MCH: 30.7 pg (ref 26.6–33.0)
MCHC: 34.5 g/dL (ref 31.5–35.7)
MCV: 89 fL (ref 79–97)
Monocytes Absolute: 0.5 10*3/uL (ref 0.1–0.9)
Monocytes: 6 %
Neutrophils Absolute: 5.4 10*3/uL (ref 1.4–7.0)
Neutrophils: 71 %
Platelets: 243 10*3/uL (ref 150–450)
RBC: 5.08 x10E6/uL (ref 4.14–5.80)
RDW: 12.5 % (ref 11.6–15.4)
WBC: 7.7 10*3/uL (ref 3.4–10.8)

## 2023-04-22 LAB — CMP14+EGFR
ALT: 34 IU/L (ref 0–44)
AST: 39 IU/L (ref 0–40)
Albumin/Globulin Ratio: 1.6
Albumin: 5.1 g/dL — ABNORMAL HIGH (ref 3.9–4.9)
Alkaline Phosphatase: 68 IU/L (ref 44–121)
BUN/Creatinine Ratio: 20 (ref 10–24)
BUN: 15 mg/dL (ref 8–27)
Bilirubin Total: 0.4 mg/dL (ref 0.0–1.2)
CO2: 19 mmol/L — ABNORMAL LOW (ref 20–29)
Calcium: 10.8 mg/dL — ABNORMAL HIGH (ref 8.6–10.2)
Chloride: 100 mmol/L (ref 96–106)
Creatinine, Ser: 0.76 mg/dL (ref 0.76–1.27)
Globulin, Total: 3.1 g/dL (ref 1.5–4.5)
Glucose: 140 mg/dL — ABNORMAL HIGH (ref 70–99)
Potassium: 4.4 mmol/L (ref 3.5–5.2)
Sodium: 136 mmol/L (ref 134–144)
Total Protein: 8.2 g/dL (ref 6.0–8.5)
eGFR: 99 mL/min/{1.73_m2} (ref >59)

## 2023-04-22 LAB — MICROALBUMIN / CREATININE URINE RATIO
Creatinine, Urine: 123.9 mg/dL
Microalb/Creat Ratio: 91 mg/g creat — ABNORMAL HIGH (ref 0–29)
Microalbumin, Urine: 113 ug/mL

## 2023-04-28 LAB — SPECIMEN STATUS REPORT

## 2023-04-29 LAB — TSH: TSH: 1.52 u[IU]/mL (ref 0.450–4.500)

## 2023-05-04 ENCOUNTER — Other Ambulatory Visit: Payer: Self-pay | Admitting: Cardiology

## 2023-05-04 DIAGNOSIS — I10 Essential (primary) hypertension: Secondary | ICD-10-CM

## 2023-05-07 ENCOUNTER — Ambulatory Visit: Payer: Medicare Other | Attending: Cardiology | Admitting: Cardiology

## 2023-05-07 ENCOUNTER — Encounter: Payer: Self-pay | Admitting: Cardiology

## 2023-05-07 VITALS — BP 140/72 | HR 64 | Ht 71.5 in | Wt 205.0 lb

## 2023-05-07 DIAGNOSIS — Z79899 Other long term (current) drug therapy: Secondary | ICD-10-CM

## 2023-05-07 DIAGNOSIS — I10 Essential (primary) hypertension: Secondary | ICD-10-CM | POA: Diagnosis present

## 2023-05-07 DIAGNOSIS — I480 Paroxysmal atrial fibrillation: Secondary | ICD-10-CM

## 2023-05-07 DIAGNOSIS — Z9889 Other specified postprocedural states: Secondary | ICD-10-CM

## 2023-05-07 DIAGNOSIS — D6869 Other thrombophilia: Secondary | ICD-10-CM | POA: Diagnosis present

## 2023-05-07 MED ORDER — HYDRALAZINE HCL 50 MG PO TABS: 50.0000 mg | ORAL_TABLET | Freq: Two times a day (BID) | ORAL | 6 refills | Status: DC

## 2023-05-07 NOTE — Progress Notes (Signed)
Clinical Summary Michael Valenzuela is a 67 y.o.male seen today for follow up of the following medical problems.    Mitral regurgitation s/p MVR - admission with diagnosis of severe MR 02/2021 - 03/08/2021 MVR with magna ease 25mm tissue valve along with LAA clipping and MAZE procedure - 04/2021 echo MVR mean gradient 10 mmHg.      05/2021 TEE: LVEF 55-60%, MV leaflets thin without restricted motion and no obstruction. Mean grade 5 mmHg, mild MR     - no recent SOB/DOE, no LE edema          2. Afib/aflutter - newly diagnosed during 02/2021 admission - s/p LAA clipping and MAZE procedure during his MVR surgery - has been on amio. LAVI is 44  05/17/2021 DCCV  -mild LFT elevation in the past, we had lowered amio to 100mg  daily and resovled - no recent palpitations - compliant with meds, no bleeding on eliquis - 04/2023 TSH 1.5, normal LFTs    3. HTN  - home bp's 140-150s/80s-90s - wants to avoid diuretic, already has chronic frequent urination.    4. Hyperlipidemia  04/2023 TC 137 TG 238 HDL 44 LDL 55   5. Chronic pain - chronic fibromylagia, followed by pain clinic. On long term opiates.  Past Medical History:  Diagnosis Date   Arthritis    Chronic low back pain    Diabetes mellitus without complication (HCC)    Fibromyalgia    Hyperlipidemia    Hypertension    Vitamin D deficiency      No Known Allergies   Current Outpatient Medications  Medication Sig Dispense Refill   amiodarone (PACERONE) 200 MG tablet Take 0.5 tablets (100 mg total) by mouth daily. 45 tablet 1   amLODipine (NORVASC) 10 MG tablet Take 1 tablet (10 mg total) by mouth daily. 90 tablet 1   apixaban (ELIQUIS) 5 MG TABS tablet TAKE 1 TABLET(5 MG) BY MOUTH TWICE DAILY 180 tablet 1   cyclobenzaprine (FLEXERIL) 10 MG tablet TAKE 1 TABLET(10 MG) BY MOUTH THREE TIMES DAILY AS NEEDED FOR MUSCLE SPASMS 270 tablet 0   glucose blood (TRUETRACK TEST) test strip USE TO TEST BLOOD SUGAR ONCE DAILY 100 strip 3    losartan (COZAAR) 100 MG tablet TAKE 1 TABLET(100 MG) BY MOUTH DAILY 90 tablet 1   Menthol-Camphor (ICY HOT ADVANCED PAIN RELIEF) 16-11 % CREA Apply 1 application topically daily as needed (pain).     metFORMIN (GLUCOPHAGE) 1000 MG tablet TAKE 1 TABLET(1000 MG) BY MOUTH TWICE DAILY WITH A MEAL 180 tablet 1   Multiple Vitamins-Minerals (MULTIVITAMIN WITH MINERALS) tablet Take 1 tablet by mouth daily.     Oxycodone HCl 20 MG TABS Take 1 tablet by mouth every 6 (six) hours.     rosuvastatin (CRESTOR) 10 MG tablet Take 1 tablet (10 mg total) by mouth daily. 90 tablet 1   sitaGLIPtin (JANUVIA) 100 MG tablet TAKE 1 TABLET(100 MG) BY MOUTH DAILY 90 tablet 1   spironolactone (ALDACTONE) 25 MG tablet Take 1 tablet by mouth once daily 30 tablet 6   Turmeric Curcumin 500 MG CAPS Take 500 mg by mouth daily.     hydrALAZINE (APRESOLINE) 50 MG tablet Take 1 tablet (50 mg total) by mouth 2 (two) times daily. 60 tablet 6   No current facility-administered medications for this visit.     Past Surgical History:  Procedure Laterality Date   CARDIOVERSION N/A 05/17/2021   Procedure: CARDIOVERSION;  Surgeon: Pricilla Riffle, MD;  Location: [facility];  Service: Cardiovascular;  Laterality: N/A;   HERNIA REPAIR     MAZE N/A 03/08/2021   Procedure: MAZE;  Surgeon: Linden Dolin, MD;  Location: [facility] OR;  Service: Open Heart Surgery;  Laterality: N/A;   MITRAL VALVE REPAIR N/A 03/08/2021   Procedure: MITRAL VALVE REPAIR USING  PHYSIO RING SIZE 28 (EXPLANTED) THEN MITRAL VALVE REPLACEMENT (MVR) WITH MAGNA EASE MITRAL VALVE SIZE  ON PUMP, LEFT ATRIAL APPENDAGE CLIPPING USING A SIZE 50 ATRIAL CLIP;  Surgeon: Linden Dolin, MD;  Location: [facility] OR;  Service: Open Heart Surgery;  Laterality: N/A;   RIGHT/LEFT HEART CATH AND CORONARY ANGIOGRAPHY N/A 03/05/2021   Procedure: RIGHT/LEFT HEART CATH AND CORONARY ANGIOGRAPHY;  Surgeon: Marykay Lex, MD;  Location: [facility] INVASIVE CV LAB;  Service: Cardiovascular;  Laterality: N/A;    TEE WITHOUT CARDIOVERSION N/A 03/05/2021   Procedure: TRANSESOPHAGEAL ECHOCARDIOGRAM (TEE);  Surgeon: Little Ishikawa, MD;  Location: [facility] ENDOSCOPY;  Service: Cardiovascular;  Laterality: N/A;   TEE WITHOUT CARDIOVERSION N/A 03/08/2021   Procedure: TRANSESOPHAGEAL ECHOCARDIOGRAM (TEE);  Surgeon: Linden Dolin, MD;  Location: [facility] OR;  Service: Open Heart Surgery;  Laterality: N/A;   TEE WITHOUT CARDIOVERSION N/A 05/17/2021   Procedure: TRANSESOPHAGEAL ECHOCARDIOGRAM (TEE);  Surgeon: Pricilla Riffle, MD;  Location: [facility];  Service: Cardiovascular;  Laterality: N/A;     No Known Allergies    Family History  Problem Relation Age of Onset   Heart disease Mother    Diabetes Mother    Cancer Mother    Heart disease Father    Diabetes Son 35       type 1   Diabetes Paternal Uncle    Heart attack Brother    Hypertension Son      Social History Michael Valenzuela reports that he has been smoking cigarettes. He has a 5.00 pack-year smoking history. He has never been exposed to tobacco smoke. He has never used smokeless tobacco. Michael Valenzuela reports that he does not currently use alcohol.   Review of Systems CONSTITUTIONAL: No weight loss, fever, chills, weakness or fatigue.  HEENT: Eyes: No visual loss, blurred vision, double vision or yellow sclerae.No hearing loss, sneezing, congestion, runny nose or sore throat.  SKIN: No rash or itching.  CARDIOVASCULAR: per hpi RESPIRATORY: No shortness of breath, cough or sputum.  GASTROINTESTINAL: No anorexia, nausea, vomiting or diarrhea. No abdominal pain or blood.  GENITOURINARY: No burning on urination, no polyuria NEUROLOGICAL: No headache, dizziness, syncope, paralysis, ataxia, numbness or tingling in the extremities. No change in bowel or bladder control.  MUSCULOSKELETAL: No muscle, back pain, joint pain or stiffness.  LYMPHATICS: No enlarged nodes. No history of splenectomy.  PSYCHIATRIC: No history of depression or anxiety.   ENDOCRINOLOGIC: No reports of sweating, cold or heat intolerance. No polyuria or polydipsia.  Marland Kitchen   Physical Examination Today's Vitals   05/07/23 0921 05/07/23 0953  BP: (!) 152/84 (!) 140/72  Pulse: 64   SpO2: 95%   Weight: 205 lb (93 kg)   Height: 5' 11.5" (1.816 m)    Body mass index is 28.19 kg/m.  Gen: resting comfortably, no acute distress HEENT: no scleral icterus, pupils equal round and reactive, no palptable cervical adenopathy,  CV: RRR, no mrg, no jvd Resp: Clear to auscultation bilaterally GI: abdomen is soft, non-tender, non-distended, normal bowel sounds, no hepatosplenomegaly MSK: extremities are warm, no edema.  Skin: warm, no rash Neuro:  no focal deficits Psych: appropriate affect   Diagnostic  Studies  TEE 03/05/21: IMPRESSIONS   1. Left ventricular ejection fraction, by estimation, is 60 to 65%. The  left ventricle has normal function.   2. Right ventricular systolic function is normal. The right ventricular  size is mildly enlarged.   3. Left atrial size was severely dilated. No left atrial/left atrial  appendage thrombus was detected.   4. Right atrial size was mildly dilated.   5. The aortic valve is tricuspid. Aortic valve regurgitation is not  visualized.   6. The mitral valve is abnormal. Flail P2. Severe mitral valve  regurgitation. MR severe by both 2D ERO and 3D VCA. Systolic flow reversal  in pulmonary veins, consistent with severe MR.   Cath 03/05/21: The left ventricular systolic function is normal. The left ventricular ejection fraction is 55-65% by visual estimate. LV end diastolic pressure is mildly elevated. There is moderate (3+) mitral regurgitation. Hemodynamic findings consistent with mild pulmonary hypertension. Angiographically normal coronary arteries   SUMMARY Angiographically Normal Coronary Arteries Mild Pulmonary Hypertension with mean PAP 30 mmHg, PCWP 5 mmHg with LVEDP 15 mmHg. Mild-moderately reduced Cardiac  Output-Cardiac Index (Fick) 4.44-2.12.       05/2021 TEE IMPRESSIONS     1. Left ventricular ejection fraction, by estimation, is 55 to 60%. The  left ventricle has normal function.   2. Right ventricular systolic function is normal. The right ventricular  size is normal.   3. LA appendage has been clipped surgically No flow seen.   4. 25 mm Magna ease Mitral valve present (03/08/21) Valve is well seated.  Leaflets thin without significant restricted motion. No obstruction seen.  Peak and mean gradients through the valve are 13 and 5 mm Hg respectively  MVA by PT1/2 is 1.44 cm2. By  planimetry from 3 D images were 1.45 cm2. (HR 65bpm). Mild mitral valve  regurgitation.          Assessment and Plan   Severe MR s/p bioprosthetic MVR - valve has looked good on follow up imaging - denies any symptmos, continue to monitor   2. Afib/aflutter/acquired thrombophilia - remains in SR since DCCV 05/2021, he remains on amio and eliquis -no symptoms continue current meds including eliquis for stroke prvention   3. HTN - above goal - avoiding diuretic due to chronic issues already with frequent urination. Low heart rates avoid av nodal agents. On max CCB, ARB, aldactone - increase hydralazine to 50mg  bid     Antoine Poche, M.D.

## 2023-05-07 NOTE — Patient Instructions (Signed)
Medication Instructions:   Increase Hydralazine to 50mg twice a day.    Continue all other medications.    Labwork: none  Testing/Procedures: none  Follow-Up: 6 months   Any Other Special Instructions Will Be Listed Below (If Applicable).  If you need a refill on your cardiac medications before your next appointment, please call your pharmacy.  

## 2023-07-23 ENCOUNTER — Ambulatory Visit (INDEPENDENT_AMBULATORY_CARE_PROVIDER_SITE_OTHER): Payer: Medicare Other | Admitting: Nurse Practitioner

## 2023-07-23 ENCOUNTER — Encounter: Payer: Self-pay | Admitting: Nurse Practitioner

## 2023-07-23 VITALS — BP 141/76 | HR 61 | Temp 97.5°F | Resp 20 | Ht 71.0 in | Wt 205.0 lb

## 2023-07-23 DIAGNOSIS — I10 Essential (primary) hypertension: Secondary | ICD-10-CM | POA: Diagnosis not present

## 2023-07-23 DIAGNOSIS — Z23 Encounter for immunization: Secondary | ICD-10-CM

## 2023-07-23 DIAGNOSIS — G8929 Other chronic pain: Secondary | ICD-10-CM

## 2023-07-23 DIAGNOSIS — I5033 Acute on chronic diastolic (congestive) heart failure: Secondary | ICD-10-CM

## 2023-07-23 DIAGNOSIS — E559 Vitamin D deficiency, unspecified: Secondary | ICD-10-CM

## 2023-07-23 DIAGNOSIS — Z6830 Body mass index (BMI) 30.0-30.9, adult: Secondary | ICD-10-CM

## 2023-07-23 DIAGNOSIS — E785 Hyperlipidemia, unspecified: Secondary | ICD-10-CM | POA: Diagnosis not present

## 2023-07-23 DIAGNOSIS — I480 Paroxysmal atrial fibrillation: Secondary | ICD-10-CM

## 2023-07-23 DIAGNOSIS — E119 Type 2 diabetes mellitus without complications: Secondary | ICD-10-CM | POA: Diagnosis not present

## 2023-07-23 DIAGNOSIS — Z7984 Long term (current) use of oral hypoglycemic drugs: Secondary | ICD-10-CM

## 2023-07-23 DIAGNOSIS — M5440 Lumbago with sciatica, unspecified side: Secondary | ICD-10-CM

## 2023-07-23 DIAGNOSIS — M797 Fibromyalgia: Secondary | ICD-10-CM

## 2023-07-23 LAB — BAYER DCA HB A1C WAIVED: HB A1C (BAYER DCA - WAIVED): 7 % — ABNORMAL HIGH (ref 4.8–5.6)

## 2023-07-23 MED ORDER — AMIODARONE HCL 200 MG PO TABS
100.0000 mg | ORAL_TABLET | Freq: Every day | ORAL | 1 refills | Status: DC
Start: 2023-07-23 — End: 2024-01-14

## 2023-07-23 MED ORDER — AMLODIPINE BESYLATE 10 MG PO TABS
10.0000 mg | ORAL_TABLET | Freq: Every day | ORAL | 1 refills | Status: DC
Start: 2023-07-23 — End: 2024-01-14

## 2023-07-23 MED ORDER — SPIRONOLACTONE 25 MG PO TABS
ORAL_TABLET | ORAL | 1 refills | Status: DC
Start: 2023-07-23 — End: 2023-10-23

## 2023-07-23 MED ORDER — ROSUVASTATIN CALCIUM 10 MG PO TABS
10.0000 mg | ORAL_TABLET | Freq: Every day | ORAL | 1 refills | Status: DC
Start: 2023-07-23 — End: 2024-01-14

## 2023-07-23 MED ORDER — APIXABAN 5 MG PO TABS
ORAL_TABLET | ORAL | 1 refills | Status: DC
Start: 2023-07-23 — End: 2024-01-14

## 2023-07-23 MED ORDER — SITAGLIPTIN PHOSPHATE 100 MG PO TABS
ORAL_TABLET | ORAL | 1 refills | Status: DC
Start: 2023-07-23 — End: 2024-01-14

## 2023-07-23 MED ORDER — METFORMIN HCL 1000 MG PO TABS
ORAL_TABLET | ORAL | 1 refills | Status: DC
Start: 2023-07-23 — End: 2024-01-14

## 2023-07-23 MED ORDER — LOSARTAN POTASSIUM 100 MG PO TABS: ORAL_TABLET | ORAL | 1 refills | Status: DC

## 2023-07-23 NOTE — Progress Notes (Signed)
Subjective:    Patient ID: Michael Valenzuela, male    DOB: 1956-09-11, 67 y.o.   MRN: [id number]   Chief Complaint: medical management of chronic issues     HPI:  Michael Valenzuela is a 67 y.o. who identifies as a male who was assigned male at birth.   Social history: Lives with: wife Work history: disability   Comes in today for follow up of the following chronic medical issues:  1. Essential hypertension, benign No c/o chest pain, sob or headache. Does not check blood pressure at home. BP Readings from Last 3 Encounters:  05/07/23 (!) 140/72  04/21/23 (!) 142/79  10/27/22 138/80     2. Hyperlipidemia with target LDL less than 100 Does not watch diet and does no exercise. Lab Results  Component Value Date   CHOL 137 04/21/2023   HDL 44 04/21/2023   LDLCALC 55 04/21/2023   TRIG 238 (H) 04/21/2023   CHOLHDL 3.1 04/21/2023     3. Diabetes mellitus treated with oral medication (HCC) He does not check his blood sugars very often. Lab Results  Component Value Date   HGBA1C 6.5 (H) 04/21/2023     4. Acute on chronic heart failure with preserved ejection fraction (HFpEF)/in the setting of severe mitral valve prolapse 5. Paroxysmal atrial fibrillation (HCC) Is on amiodrone and sees cardiology every 6 months. Last saw cardiology on  05/07/23- no changes made to plan of care  6. Chronic midline low back pain with sciatica, sciatica laterality unspecified 7. Fibromyalgia Sees pain management. Says they are really not helping with his pain but he continues to get meds monthly  8. Vitamin D deficiency Is on daily vitamin d supplement  9. BMI 30.0-30.9,adult No recent weight changes Wt Readings from Last 3 Encounters:  07/23/23 205 lb (93 kg)  05/07/23 205 lb (93 kg)  04/21/23 198 lb (89.8 kg)   BMI Readings from Last 3 Encounters:  07/23/23 28.59 kg/m  05/07/23 28.19 kg/m  04/21/23 27.62 kg/m      New complaints: None today  No Known Allergies Outpatient  Encounter Medications as of 07/23/2023  Medication Sig   amiodarone (PACERONE) 200 MG tablet Take 0.5 tablets (100 mg total) by mouth daily.   amLODipine (NORVASC) 10 MG tablet Take 1 tablet (10 mg total) by mouth daily.   apixaban (ELIQUIS) 5 MG TABS tablet TAKE 1 TABLET(5 MG) BY MOUTH TWICE DAILY   cyclobenzaprine (FLEXERIL) 10 MG tablet TAKE 1 TABLET(10 MG) BY MOUTH THREE TIMES DAILY AS NEEDED FOR MUSCLE SPASMS   glucose blood (TRUETRACK TEST) test strip USE TO TEST BLOOD SUGAR ONCE DAILY   hydrALAZINE (APRESOLINE) 50 MG tablet Take 1 tablet (50 mg total) by mouth 2 (two) times daily.   losartan (COZAAR) 100 MG tablet TAKE 1 TABLET(100 MG) BY MOUTH DAILY   Menthol-Camphor (ICY HOT ADVANCED PAIN RELIEF) 16-11 % CREA Apply 1 application topically daily as needed (pain).   metFORMIN (GLUCOPHAGE) 1000 MG tablet TAKE 1 TABLET(1000 MG) BY MOUTH TWICE DAILY WITH A MEAL   Multiple Vitamins-Minerals (MULTIVITAMIN WITH MINERALS) tablet Take 1 tablet by mouth daily.   Oxycodone HCl 20 MG TABS Take 1 tablet by mouth every 6 (six) hours.   rosuvastatin (CRESTOR) 10 MG tablet Take 1 tablet (10 mg total) by mouth daily.   sitaGLIPtin (JANUVIA) 100 MG tablet TAKE 1 TABLET(100 MG) BY MOUTH DAILY   spironolactone (ALDACTONE) 25 MG tablet Take 1 tablet by mouth once daily   Turmeric Curcumin 500 MG  CAPS Take 500 mg by mouth daily.   No facility-administered encounter medications on file as of 07/23/2023.    Past Surgical History:  Procedure Laterality Date   CARDIOVERSION N/A 05/17/2021   Procedure: CARDIOVERSION;  Surgeon: Pricilla Riffle, MD;  Location: [facility];  Service: Cardiovascular;  Laterality: N/A;   HERNIA REPAIR     MAZE N/A 03/08/2021   Procedure: MAZE;  Surgeon: Linden Dolin, MD;  Location: [facility] OR;  Service: Open Heart Surgery;  Laterality: N/A;   MITRAL VALVE REPAIR N/A 03/08/2021   Procedure: MITRAL VALVE REPAIR USING  PHYSIO RING SIZE 28 (EXPLANTED) THEN MITRAL VALVE REPLACEMENT (MVR) WITH  MAGNA EASE MITRAL VALVE SIZE  ON PUMP, LEFT ATRIAL APPENDAGE CLIPPING USING A SIZE 50 ATRIAL CLIP;  Surgeon: Linden Dolin, MD;  Location: [facility] OR;  Service: Open Heart Surgery;  Laterality: N/A;   RIGHT/LEFT HEART CATH AND CORONARY ANGIOGRAPHY N/A 03/05/2021   Procedure: RIGHT/LEFT HEART CATH AND CORONARY ANGIOGRAPHY;  Surgeon: Marykay Lex, MD;  Location: [facility] INVASIVE CV LAB;  Service: Cardiovascular;  Laterality: N/A;   TEE WITHOUT CARDIOVERSION N/A 03/05/2021   Procedure: TRANSESOPHAGEAL ECHOCARDIOGRAM (TEE);  Surgeon: Little Ishikawa, MD;  Location: [facility] ENDOSCOPY;  Service: Cardiovascular;  Laterality: N/A;   TEE WITHOUT CARDIOVERSION N/A 03/08/2021   Procedure: TRANSESOPHAGEAL ECHOCARDIOGRAM (TEE);  Surgeon: Linden Dolin, MD;  Location: [facility] OR;  Service: Open Heart Surgery;  Laterality: N/A;   TEE WITHOUT CARDIOVERSION N/A 05/17/2021   Procedure: TRANSESOPHAGEAL ECHOCARDIOGRAM (TEE);  Surgeon: Pricilla Riffle, MD;  Location: [facility];  Service: Cardiovascular;  Laterality: N/A;    Family History  Problem Relation Age of Onset   Heart disease Mother    Diabetes Mother    Cancer Mother    Heart disease Father    Diabetes Son 7       type 1   Diabetes Paternal Uncle    Heart attack Brother    Hypertension Son       Controlled substance contract: n/a     Review of Systems  Constitutional:  Negative for diaphoresis.  Eyes:  Negative for pain.  Respiratory:  Negative for shortness of breath.   Cardiovascular:  Negative for chest pain, palpitations and leg swelling.  Gastrointestinal:  Negative for abdominal pain.  Endocrine: Negative for polydipsia.  Skin:  Negative for rash.  Neurological:  Negative for dizziness, weakness and headaches.  Hematological:  Does not bruise/bleed easily.  All other systems reviewed and are negative.      Objective:   Physical Exam Vitals and nursing note reviewed.  Constitutional:      Appearance: Normal appearance. He is  well-developed.  HENT:     Head: Normocephalic.     Nose: Nose normal.     Mouth/Throat:     Mouth: Mucous membranes are moist.     Pharynx: Oropharynx is clear.  Eyes:     Pupils: Pupils are equal, round, and reactive to light.  Neck:     Thyroid: No thyroid mass or thyromegaly.     Vascular: No carotid bruit or JVD.     Trachea: Phonation normal.  Cardiovascular:     Rate and Rhythm: Normal rate and regular rhythm.  Pulmonary:     Effort: Pulmonary effort is normal. No respiratory distress.     Breath sounds: Normal breath sounds.  Abdominal:     General: Bowel sounds are normal.     Palpations: Abdomen is soft.     Tenderness:  There is no abdominal tenderness.  Musculoskeletal:        General: Normal range of motion.     Cervical back: Normal range of motion and neck supple.  Lymphadenopathy:     Cervical: No cervical adenopathy.  Skin:    General: Skin is warm and dry.  Neurological:     Mental Status: He is alert and oriented to person, place, and time.  Psychiatric:        Behavior: Behavior normal.        Thought Content: Thought content normal.        Judgment: Judgment normal.     BP (!) 141/76   Pulse 61   Temp (!) 97.5 F (36.4 C) (Temporal)   Resp 20   Ht 5\' 11"  (1.803 m)   Wt 205 lb (93 kg)   SpO2 97%   BMI 28.59 kg/m        Assessment & Plan:  Michael Valenzuela comes in today with chief complaint of Medical Management of Chronic Issues   Diagnosis and orders addressed:  1. Essential hypertension, benign Low sodium diet - CBC with Differential/Platelet - CMP14+EGFR - amLODipine (NORVASC) 10 MG tablet; Take 1 tablet (10 mg total) by mouth daily.  Dispense: 90 tablet; Refill: 1 - losartan (COZAAR) 100 MG tablet; TAKE 1 TABLET(100 MG) BY MOUTH DAILY  Dispense: 90 tablet; Refill: 1 - spironolactone (ALDACTONE) 25 MG tablet; Take 1 tablet by mouth once daily  Dispense: 90 tablet; Refill: 1  2. Hyperlipidemia with target LDL less than 100 Low fat  diet - Lipid panel - rosuvastatin (CRESTOR) 10 MG tablet; Take 1 tablet (10 mg total) by mouth daily.  Dispense: 90 tablet; Refill: 1  3. Diabetes mellitus treated with oral medication (HCC) Continue to watch carbs in diet - Bayer DCA Hb A1c Waived - metFORMIN (GLUCOPHAGE) 1000 MG tablet; TAKE 1 TABLET(1000 MG) BY MOUTH TWICE DAILY WITH A MEAL  Dispense: 180 tablet; Refill: 1 - sitaGLIPtin (JANUVIA) 100 MG tablet; TAKE 1 TABLET(100 MG) BY MOUTH DAILY  Dispense: 90 tablet; Refill: 1  4. Acute on chronic heart failure with preserved ejection fraction (HFpEF)/in the setting of severe mitral valve prolapse 5. Paroxysmal atrial fibrillation (HCC) Keep follow up appointment with cardiology - amiodarone (PACERONE) 200 MG tablet; Take 0.5 tablets (100 mg total) by mouth daily.  Dispense: 45 tablet; Refill: 1 - apixaban (ELIQUIS) 5 MG TABS tablet; TAKE 1 TABLET(5 MG) BY MOUTH TWICE DAILY  Dispense: 180 tablet; Refill: 1  6. Chronic midline low back pain with sciatica, sciatica laterality unspecified 7. Fibromyalgia [facility]  8. Vitamin D deficiency Continue vitamin d supplement   9. BMI 30.0-30.9,adult Discussed diet and exercise for person with BMI >25 Will recheck weight in 3-6 months    Labs pending Health Maintenance reviewed Diet and exercise encouraged  Follow up plan: 6 months   Mary-Margaret Daphine Deutscher, FNP

## 2023-07-23 NOTE — Patient Instructions (Signed)
 Influenza (Flu) Vaccine (Inactivated or Recombinant): What You Need to Know Many vaccine information statements are available in Spanish and other languages. See PromoAge.com.br. 1. Why get vaccinated? Influenza vaccine can prevent influenza (flu). Flu is a contagious disease that spreads around the [place] every year, usually between October and May. Anyone can get the flu, but it is more dangerous for some people. Infants and young children, people 46 years and older, pregnant people, and people with certain health conditions or a weakened immune system are at greatest risk of flu complications. Pneumonia, bronchitis, sinus infections, and ear infections are examples of flu-related complications. If you have a medical condition, such as heart disease, cancer, or diabetes, flu can make it worse. Flu can cause fever and chills, sore throat, muscle aches, fatigue, cough, headache, and runny or stuffy nose. Some people may have vomiting and diarrhea, though this is more common in children than adults. In an average year, thousands of people in the [place] die from flu, and many more are hospitalized. Flu vaccine prevents millions of illnesses and flu-related visits to the doctor each year. 2. Influenza vaccines CDC recommends everyone 6 months and older get vaccinated every flu season. Children 6 months through 44 years of age may need 2 doses during a single flu season. Everyone else needs only 1 dose each flu season. It takes about 2 weeks for protection to develop after vaccination. There are many flu viruses, and they are always changing. Each year a new flu vaccine is made to protect against the influenza viruses believed to be likely to cause disease in the upcoming flu season. Even when the vaccine doesn't exactly match these viruses, it may still provide some protection. Influenza vaccine does not cause flu. Influenza vaccine may be given at the same time as other vaccines. 3.  Talk with your health care provider Tell your vaccination provider if the person getting the vaccine: Has had an allergic reaction after a previous dose of influenza vaccine, or has any severe, life-threatening allergies Has ever had Guillain-Barr Syndrome (also called "GBS") In some cases, your health care provider may decide to postpone influenza vaccination until a future visit. Influenza vaccine can be administered at any time during pregnancy. People who are or will be pregnant during influenza season should receive inactivated influenza vaccine. People with minor illnesses, such as a cold, may be vaccinated. People who are moderately or severely ill should usually wait until they recover before getting influenza vaccine. Your health care provider can give you more information. 4. Risks of a vaccine reaction Soreness, redness, and swelling where the shot is given, fever, muscle aches, and headache can happen after influenza vaccination. There may be a very small increased risk of Guillain-Barr Syndrome (GBS) after inactivated influenza vaccine (the flu shot). Young children who get the flu shot along with pneumococcal vaccine (PCV13) and/or DTaP vaccine at the same time might be slightly more likely to have a seizure caused by fever. Tell your health care provider if a child who is getting flu vaccine has ever had a seizure. People sometimes faint after medical procedures, including vaccination. Tell your provider if you feel dizzy or have vision changes or ringing in the ears. As with any medicine, there is a very remote chance of a vaccine causing a severe allergic reaction, other serious injury, or death. 5. What if there is a serious problem? An allergic reaction could occur after the vaccinated person leaves the clinic. If you see signs of  a severe allergic reaction (hives, swelling of the face and throat, difficulty breathing, a fast heartbeat, dizziness, or weakness), call 9-1-1 and get  the person to the nearest hospital. For other signs that concern you, call your health care provider. Adverse reactions should be reported to the Vaccine Adverse Event Reporting System (VAERS). Your health care provider will usually file this report, or you can do it yourself. Visit the VAERS website at www.vaers.LAgents.no or call 9511693751. VAERS is only for reporting reactions, and VAERS staff members do not give medical advice. 6. The National Vaccine Injury Compensation Program The [employer] Vaccine Injury Compensation Program (VICP) is a federal program that was created to compensate people who may have been injured by certain vaccines. Claims regarding alleged injury or death due to vaccination have a time limit for filing, which may be as short as two years. Visit the VICP website at SpiritualWord.at or call 367-854-3561 to learn about the program and about filing a claim. 7. How can I learn more? Ask your health care provider. Call your local or state health department. Visit the website of the Food and Drug Administration (FDA) for vaccine package inserts and additional information at FinderList.no. Contact the Centers for Disease Control and Prevention (CDC): Call (651)315-0289 (1-800-CDC-INFO) or Visit CDC's website at BiotechRoom.com.cy. Source: CDC Vaccine Information Statement Inactivated Influenza Vaccine (06/15/2020) This same material is available at FootballExhibition.com.br for no charge. This information is not intended to replace advice given to you by your health care provider. Make sure you discuss any questions you have with your health care provider. Document Revised: 02/11/2023 Document Reviewed: 11/17/2022 Elsevier Patient Education  2024 [employer].

## 2023-07-24 LAB — CMP14+EGFR
ALT: 50 IU/L — ABNORMAL HIGH (ref 0–44)
AST: 66 IU/L — ABNORMAL HIGH (ref 0–40)
Albumin: 4.9 g/dL (ref 3.9–4.9)
Alkaline Phosphatase: 68 IU/L (ref 44–121)
BUN/Creatinine Ratio: 21 (ref 10–24)
BUN: 16 mg/dL (ref 8–27)
Bilirubin Total: 0.5 mg/dL (ref 0.0–1.2)
CO2: 19 mmol/L — ABNORMAL LOW (ref 20–29)
Calcium: 9.6 mg/dL (ref 8.6–10.2)
Chloride: 100 mmol/L (ref 96–106)
Creatinine, Ser: 0.75 mg/dL — ABNORMAL LOW (ref 0.76–1.27)
Globulin, Total: 2.7 g/dL (ref 1.5–4.5)
Glucose: 127 mg/dL — ABNORMAL HIGH (ref 70–99)
Potassium: 4.7 mmol/L (ref 3.5–5.2)
Sodium: 136 mmol/L (ref 134–144)
Total Protein: 7.6 g/dL (ref 6.0–8.5)
eGFR: 98 mL/min/{1.73_m2} (ref >59)

## 2023-07-24 LAB — CBC WITH DIFFERENTIAL/PLATELET
Basophils Absolute: 0.1 10*3/uL (ref 0.0–0.2)
Basos: 1 %
EOS (ABSOLUTE): 0.5 10*3/uL — ABNORMAL HIGH (ref 0.0–0.4)
Eos: 7 %
Hematocrit: 44.7 % (ref 37.5–51.0)
Hemoglobin: 14.6 g/dL (ref 13.0–17.7)
Immature Grans (Abs): 0 10*3/uL (ref 0.0–0.1)
Immature Granulocytes: 0 %
Lymphocytes Absolute: 1.4 10*3/uL (ref 0.7–3.1)
Lymphs: 19 %
MCH: 30.3 pg (ref 26.6–33.0)
MCHC: 32.7 g/dL (ref 31.5–35.7)
MCV: 93 fL (ref 79–97)
Monocytes Absolute: 0.6 10*3/uL (ref 0.1–0.9)
Monocytes: 8 %
Neutrophils Absolute: 4.5 10*3/uL (ref 1.4–7.0)
Neutrophils: 65 %
Platelets: 237 10*3/uL (ref 150–450)
RBC: 4.82 x10E6/uL (ref 4.14–5.80)
RDW: 12.6 % (ref 11.6–15.4)
WBC: 7.1 10*3/uL (ref 3.4–10.8)

## 2023-07-24 LAB — LIPID PANEL
Chol/HDL Ratio: 3.6 ratio (ref 0.0–5.0)
Cholesterol, Total: 146 mg/dL (ref 100–199)
HDL: 41 mg/dL (ref >39)
LDL Chol Calc (NIH): 61 mg/dL (ref 0–99)
Triglycerides: 279 mg/dL — ABNORMAL HIGH (ref 0–149)
VLDL Cholesterol Cal: 44 mg/dL — ABNORMAL HIGH (ref 5–40)

## 2023-08-13 ENCOUNTER — Encounter: Payer: Self-pay | Admitting: Nurse Practitioner

## 2023-08-13 ENCOUNTER — Ambulatory Visit (INDEPENDENT_AMBULATORY_CARE_PROVIDER_SITE_OTHER): Payer: Medicare Other | Admitting: Nurse Practitioner

## 2023-08-13 VITALS — BP 137/83 | HR 71 | Temp 97.8°F | Resp 20 | Ht 71.0 in | Wt 207.0 lb

## 2023-08-13 DIAGNOSIS — N644 Mastodynia: Secondary | ICD-10-CM

## 2023-08-13 NOTE — Progress Notes (Signed)
Subjective:    Patient ID: Michael Valenzuela, male    DOB: [birth year], 67 y.o.   MRN: [id number]   Chief Complaint: Area on right breast   HPI  Patient comes in c/o right breast pain. No nodules palpable. 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  Eyes:  Negative for pain.  Respiratory:  Negative for shortness of breath.   Cardiovascular:  Negative for chest pain, palpitations and leg swelling.  Gastrointestinal:  Negative for abdominal pain.  Endocrine: Negative for polydipsia.  Skin:  Negative for rash.  Neurological:  Negative for dizziness, weakness and headaches.  Hematological:  Does not bruise/bleed easily.  All other systems reviewed and are negative.      Objective:   Physical Exam Constitutional:      Appearance: Normal appearance.  Cardiovascular:     Rate and Rhythm: Normal rate and regular rhythm.     Heart sounds: Normal heart sounds.  Pulmonary:     Effort: Pulmonary effort is normal.     Breath sounds:  Normal breath sounds.  Chest:  Breasts:    Right: Tenderness present. No inverted nipple, mass, nipple discharge or skin change.     Left: Normal.  Skin:    General: Skin is warm.  Neurological:     General: No focal deficit present.     Mental Status: He is alert and oriented to person, place, and time.  Psychiatric:        Mood and Affect: Mood normal.        Behavior: Behavior normal.    BP 137/83   Pulse 71   Temp 97.8 F (36.6 C) (Temporal)   Resp 20   Ht 5\' 11"  (1.803 m)   Wt 207 lb (93.9 kg)   SpO2 97%   BMI 28.87 kg/m         Assessment & Plan:   Michael Valenzuela in today with chief complaint of Area on right breast   1. Breast pain, right Motrin or tylenol OTC Cool compress If does not improve will schedule Ultra sound    The above assessment and management plan was discussed with the patient. The patient verbalized understanding of and has agreed to the management plan. Patient is aware to call the clinic if symptoms persist or worsen. Patient is aware when to return to the clinic for a follow-up visit. Patient educated on when it is appropriate to go to the emergency department.   Mary-Margaret Daphine Deutscher, FNP

## 2023-08-13 NOTE — Patient Instructions (Signed)
Breast Tenderness Breast tenderness is a common problem for women of all ages, but may also occur in men. Breast tenderness has many possible causes, including hormone changes, infections, taking certain medicines, and caffeine intake. In women, the pain usually comes and goes with the menstrual cycle, but it can also be constant. Breast tenderness may range from mild discomfort to severe pain. You may have tests, such as a mammogram or an ultrasound, to check for any unusual findings. Having breast tenderness usually does not mean that you have breast cancer. Follow these instructions at home: Managing pain and discomfort  If directed, put ice on the painful area. To do this: Put ice in a plastic bag. Place a towel between your skin and the bag. Leave the ice on for 20 minutes, 2-3 times a day. If your skin turns bright red, remove the ice right away to prevent skin damage. The risk of skin damage is higher if you cannot feel pain, heat, or cold. Wear a supportive bra or chest support: During exercise. While sleeping, if your breasts are very tender. Medicines Take over-the-counter and prescription medicines only as told by your health care provider. If the cause of your pain is an infection, you may be prescribed an antibiotic medicine. If you were prescribed antibiotics, take them as told by your health care provider. Do not stop using the antibiotic even if you start to feel better. Eating and drinking Decrease the amount of caffeine in your diet. Instead, drink more water and choose caffeine-free drinks. Your health care provider may recommend that you lessen the amount of fat in your diet. You can do this by: Limiting fried foods. Cooking foods using methods such as baking, boiling, grilling, and broiling. General instructions  Keep a log of the days and times when your breasts are most tender. Ask your health care provider how to do breast exams at home. This will help you notice if  you have an unusual growth or lump. Keep all follow-up visits. Contact a health care provider if: Any part of your breast is hard, red, and hot to the touch. This may be a sign of infection. You are a woman and have a new or painful lump in your breast that remains after your menstrual period ends. You are not breastfeeding and you have fluid, especially blood or pus, coming out of your nipples. You have a fever. Your pain does not improve or it gets worse. Your pain is interfering with your daily activities. Summary Breast tenderness may range from mild discomfort to severe pain. Breast tenderness has many possible causes, including hormone changes, infections, taking certain medicines, and caffeine intake. It can be treated with ice, wearing a supportive bra or chest support, and medicines. Make changes to your diet as told by your health care provider. This information is not intended to replace advice given to you by your health care provider. Make sure you discuss any questions you have with your health care provider. Document Revised: 01/08/2022 Document Reviewed: 01/08/2022 Elsevier Patient Education  2024 [employer].

## 2023-10-12 ENCOUNTER — Encounter: Payer: Self-pay | Admitting: Anesthesiology

## 2023-10-15 ENCOUNTER — Encounter: Payer: Self-pay | Admitting: Cardiology

## 2023-10-23 ENCOUNTER — Ambulatory Visit: Payer: Medicare Other | Attending: Cardiology | Admitting: Cardiology

## 2023-10-23 ENCOUNTER — Encounter: Payer: Self-pay | Admitting: Cardiology

## 2023-10-23 VITALS — BP 136/74 | HR 62 | Ht 71.5 in | Wt 207.0 lb

## 2023-10-23 DIAGNOSIS — I10 Essential (primary) hypertension: Secondary | ICD-10-CM

## 2023-10-23 DIAGNOSIS — I480 Paroxysmal atrial fibrillation: Secondary | ICD-10-CM

## 2023-10-23 DIAGNOSIS — Z9889 Other specified postprocedural states: Secondary | ICD-10-CM

## 2023-10-23 DIAGNOSIS — I059 Rheumatic mitral valve disease, unspecified: Secondary | ICD-10-CM | POA: Diagnosis present

## 2023-10-23 MED ORDER — SPIRONOLACTONE 25 MG PO TABS: ORAL_TABLET | ORAL | 3 refills | Status: DC

## 2023-10-23 MED ORDER — HYDRALAZINE HCL 50 MG PO TABS: 50.0000 mg | ORAL_TABLET | Freq: Two times a day (BID) | ORAL | 3 refills | Status: DC

## 2023-10-23 NOTE — Progress Notes (Signed)
Clinical Summary Michael Valenzuela is a 67 y.o.male seen today for follow up of the following medical problems.    Mitral regurgitation s/p MVR - admission with diagnosis of severe MR 02/2021 - 03/08/2021 MVR with magna ease 25mm tissue valve along with LAA clipping and MAZE procedure - 04/2021 echo MVR mean gradient 10 mmHg.      05/2021 TEE: LVEF 55-60%, MV leaflets thin without restricted motion and no obstruction. Mean grade 5 mmHg, mild MR     - no SOB/DOE, no recent edema - compliant with meds         2. Afib/aflutter - newly diagnosed during 02/2021 admission - s/p LAA clipping and MAZE procedure during his MVR surgery - has been on amio. LAVI is 44  05/17/2021 DCCV   -mild LFT elevation in the past, we had lowered amio to 100mg  daily and resovled - no recent palpitations - compliant with meds, no bleeding on eliquis - 04/2023 TSH 1.5, normal LFTs    - EKG today shows NSR - no recent palpitations. No bleeding on eliquis. - very mild LFT elevation on last check . 04/2023 TSH was normal.      3. HTN - wants to avoid diuretic, already has chronic frequent urination.  - compliant with meds   4. Hyperlipidemia  04/2023 TC 137 TG 238 HDL 44 LDL 55 07/2023 TC 146 TG 279 HDL 41 LDL 61   5. Chronic pain - chronic fibromylagia, followed by pain clinic. On long term opiates.  Past Medical History:  Diagnosis Date   Arthritis    Chronic low back pain    Diabetes mellitus without complication (HCC)    Fibromyalgia    Hyperlipidemia    Hypertension    Vitamin D deficiency      No Known Allergies   Current Outpatient Medications  Medication Sig Dispense Refill   amiodarone (PACERONE) 200 MG tablet Take 0.5 tablets (100 mg total) by mouth daily. 45 tablet 1   amLODipine (NORVASC) 10 MG tablet Take 1 tablet (10 mg total) by mouth daily. 90 tablet 1   apixaban (ELIQUIS) 5 MG TABS tablet TAKE 1 TABLET(5 MG) BY MOUTH TWICE DAILY 180 tablet 1   cyclobenzaprine (FLEXERIL) 10  MG tablet TAKE 1 TABLET(10 MG) BY MOUTH THREE TIMES DAILY AS NEEDED FOR MUSCLE SPASMS 270 tablet 0   glucose blood (TRUETRACK TEST) test strip USE TO TEST BLOOD SUGAR ONCE DAILY 100 strip 3   hydrALAZINE (APRESOLINE) 50 MG tablet Take 1 tablet (50 mg total) by mouth 2 (two) times daily. 60 tablet 6   losartan (COZAAR) 100 MG tablet TAKE 1 TABLET(100 MG) BY MOUTH DAILY 90 tablet 1   Menthol-Camphor (ICY HOT ADVANCED PAIN RELIEF) 16-11 % CREA Apply 1 application topically daily as needed (pain).     metFORMIN (GLUCOPHAGE) 1000 MG tablet TAKE 1 TABLET(1000 MG) BY MOUTH TWICE DAILY WITH A MEAL 180 tablet 1   Multiple Vitamins-Minerals (MULTIVITAMIN WITH MINERALS) tablet Take 1 tablet by mouth daily.     Oxycodone HCl 20 MG TABS Take 1 tablet by mouth every 6 (six) hours.     rosuvastatin (CRESTOR) 10 MG tablet Take 1 tablet (10 mg total) by mouth daily. 90 tablet 1   sitaGLIPtin (JANUVIA) 100 MG tablet TAKE 1 TABLET(100 MG) BY MOUTH DAILY 90 tablet 1   spironolactone (ALDACTONE) 25 MG tablet Take 1 tablet by mouth once daily 90 tablet 1   Turmeric Curcumin 500 MG CAPS Take 500  mg by mouth daily.     No current facility-administered medications for this visit.     Past Surgical History:  Procedure Laterality Date   CARDIOVERSION N/A 05/17/2021   Procedure: CARDIOVERSION;  Surgeon: Pricilla Riffle, MD;  Location: [facility];  Service: Cardiovascular;  Laterality: N/A;   HERNIA REPAIR     MAZE N/A 03/08/2021   Procedure: MAZE;  Surgeon: Linden Dolin, MD;  Location: [facility] OR;  Service: Open Heart Surgery;  Laterality: N/A;   MITRAL VALVE REPAIR N/A 03/08/2021   Procedure: MITRAL VALVE REPAIR USING  PHYSIO RING SIZE 28 (EXPLANTED) THEN MITRAL VALVE REPLACEMENT (MVR) WITH MAGNA EASE MITRAL VALVE SIZE  ON PUMP, LEFT ATRIAL APPENDAGE CLIPPING USING A SIZE 50 ATRIAL CLIP;  Surgeon: Linden Dolin, MD;  Location: [facility] OR;  Service: Open Heart Surgery;  Laterality: N/A;   RIGHT/LEFT HEART CATH AND CORONARY  ANGIOGRAPHY N/A 03/05/2021   Procedure: RIGHT/LEFT HEART CATH AND CORONARY ANGIOGRAPHY;  Surgeon: Marykay Lex, MD;  Location[facility]AS[facility] CV LAB;  Service: Cardiovascular;  Laterality: N/A;   TEE WITHOUT CARDIOVERSION N/A 03/05/2021   Procedure: TRANSESOPHAGEAL ECHOCARDIOGRAM (TEE);  Surgeon: Little Ishikawa, MD;  Location[facility] ENDOSCOPY;  Service: Cardiovascular;  Laterality: N/A;   TEE WITHOUT CARDIOVERSION N/A 03/08/2021   Procedure: TRANSESOPHAGEAL ECHOCARDIOGRAM (TEE);  Surgeon: Linden Dolin, MD;  Location: [facility] OR;  Service: Open Heart Surgery;  Laterality: N/A;   TEE WITHOUT CARDIOVERSION N/A 05/17/2021   Procedure: TRANSESOPHAGEAL ECHOCARDIOGRAM (TEE);  Surgeon: Pricilla Riffle, MD;  Location: [facility];  Service: Cardiovascular;  Laterality: N/A;     No Known Allergies    Family History  Problem Relation Age of Onset   Heart disease Mother    Diabetes Mother    Cancer Mother    Heart disease Father    Diabetes Son 24       type 1   Diabetes Paternal Uncle    Heart attack Brother    Hypertension Son      Social History Michael Valenzuela reports that he has been smoking cigarettes. He started smoking about 24 years ago. He has a 5 pack-year smoking history. He has never been exposed to tobacco smoke. He has never used smokeless tobacco. Michael Valenzuela reports that he does not currently use alcohol.   Review of Systems CONSTITUTIONAL: No weight loss, fever, chills, weakness or fatigue.  HEENT: Eyes: No visual loss, blurred vision, double vision or yellow sclerae.No hearing loss, sneezing, congestion, runny nose or sore throat.  SKIN: No rash or itching.  CARDIOVASCULAR: per hpi RESPIRATORY: No shortness of breath, cough or sputum.  GASTROINTESTINAL: No anorexia, nausea, vomiting or diarrhea. No abdominal pain or blood.  GENITOURINARY: No burning on urination, no polyuria NEUROLOGICAL: No headache, dizziness, syncope, paralysis, ataxia, numbness or tingling in the extremities. No  change in bowel or bladder control.  MUSCULOSKELETAL: No muscle, back pain, joint pain or stiffness.  LYMPHATICS: No enlarged nodes. No history of splenectomy.  PSYCHIATRIC: No history of depression or anxiety.  ENDOCRINOLOGIC: No reports of sweating, cold or heat intolerance. No polyuria or polydipsia.  Marland Kitchen   Physical Examination Today's Vitals   10/23/23 1034  BP: 136/74  Pulse: 62  SpO2: 96%  Weight: 207 lb (93.9 kg)  Height: 5' 11.5" (1.816 m)   Body mass index is 28.47 kg/m.  Gen: resting comfortably, no acute distress HEENT: no scleral icterus, pupils equal round and reactive, no palptable cervical adenopathy,  CV: RRR, no m/rg,,  no jvd Resp: Clear to auscultation bilaterally GI: abdomen is soft, non-tender, non-distended, normal bowel sounds, no hepatosplenomegaly MSK: extremities are warm, no edema.  Skin: warm, no rash Neuro:  no focal deficits Psych: appropriate affect   Diagnostic Studies  TEE 03/05/21: IMPRESSIONS   1. Left ventricular ejection fraction, by estimation, is 60 to 65%. The  left ventricle has normal function.   2. Right ventricular systolic function is normal. The right ventricular  size is mildly enlarged.   3. Left atrial size was severely dilated. No left atrial/left atrial  appendage thrombus was detected.   4. Right atrial size was mildly dilated.   5. The aortic valve is tricuspid. Aortic valve regurgitation is not  visualized.   6. The mitral valve is abnormal. Flail P2. Severe mitral valve  regurgitation. MR severe by both 2D ERO and 3D VCA. Systolic flow reversal  in pulmonary veins, consistent with severe MR.   Cath 03/05/21: The left ventricular systolic function is normal. The left ventricular ejection fraction is 55-65% by visual estimate. LV end diastolic pressure is mildly elevated. There is moderate (3+) mitral regurgitation. Hemodynamic findings consistent with mild pulmonary hypertension. Angiographically normal coronary  arteries   SUMMARY Angiographically Normal Coronary Arteries Mild Pulmonary Hypertension with mean PAP 30 mmHg, PCWP 5 mmHg with LVEDP 15 mmHg. Mild-moderately reduced Cardiac Output-Cardiac Index (Fick) 4.44-2.12.       05/2021 TEE IMPRESSIONS     1. Left ventricular ejection fraction, by estimation, is 55 to 60%. The  left ventricle has normal function.   2. Right ventricular systolic function is normal. The right ventricular  size is normal.   3. LA appendage has been clipped surgically No flow seen.   4. 25 mm Magna ease Mitral valve present (03/08/21) Valve is well seated.  Leaflets thin without significant restricted motion. No obstruction seen.  Peak and mean gradients through the valve are 13 and 5 mm Hg respectively  MVA by PT1/2 is 1.44 cm2. By  planimetry from 3 D images were 1.45 cm2. (HR 65bpm). Mild mitral valve  regurgitation.            Assessment and Plan  Severe MR s/p bioprosthetic MVR -doing well without symptoms, continue to monitor   2. Afib/aflutter/acquired thrombophilia - remains in SR since DCCV 05/2021, he remains on amio and eliquis -no symptoms, EKG today shows SR - very mild LFT elevation, monitor at this time.    3. HTN - avoiding diuretic due to chronic issues already with frequent urination. Low heart rates avoid av nodal agents.  - at goal, continue current meds   F/u 6 months   Antoine Poche, M.D.

## 2023-10-23 NOTE — Patient Instructions (Addendum)

## 2023-11-25 ENCOUNTER — Other Ambulatory Visit: Payer: Self-pay | Admitting: Anesthesiology

## 2023-11-25 DIAGNOSIS — M545 Low back pain, unspecified: Secondary | ICD-10-CM

## 2023-11-25 DIAGNOSIS — M4327 Fusion of spine, lumbosacral region: Secondary | ICD-10-CM

## 2023-12-22 ENCOUNTER — Ambulatory Visit
Admission: RE | Admit: 2023-12-22 | Discharge: 2023-12-22 | Disposition: A | Discharge status: Home / Self Care | Payer: Medicare Other | Source: Ambulatory Visit | Attending: Anesthesiology | Admitting: Anesthesiology

## 2023-12-22 DIAGNOSIS — M4327 Fusion of spine, lumbosacral region: Secondary | ICD-10-CM

## 2023-12-22 DIAGNOSIS — M545 Low back pain, unspecified: Secondary | ICD-10-CM

## 2023-12-29 LAB — HM DIABETES EYE EXAM

## 2024-01-14 ENCOUNTER — Ambulatory Visit (INDEPENDENT_AMBULATORY_CARE_PROVIDER_SITE_OTHER): Payer: No Typology Code available for payment source | Admitting: Nurse Practitioner

## 2024-01-14 ENCOUNTER — Encounter: Payer: Self-pay | Admitting: Nurse Practitioner

## 2024-01-14 VITALS — BP 124/68 | HR 61 | Temp 97.5°F | Ht 71.0 in | Wt 202.0 lb

## 2024-01-14 DIAGNOSIS — N644 Mastodynia: Secondary | ICD-10-CM | POA: Diagnosis not present

## 2024-01-14 DIAGNOSIS — M5440 Lumbago with sciatica, unspecified side: Secondary | ICD-10-CM | POA: Diagnosis not present

## 2024-01-14 DIAGNOSIS — Z0001 Encounter for general adult medical examination with abnormal findings: Secondary | ICD-10-CM

## 2024-01-14 DIAGNOSIS — E119 Type 2 diabetes mellitus without complications: Secondary | ICD-10-CM

## 2024-01-14 DIAGNOSIS — I10 Essential (primary) hypertension: Secondary | ICD-10-CM | POA: Diagnosis not present

## 2024-01-14 DIAGNOSIS — I480 Paroxysmal atrial fibrillation: Secondary | ICD-10-CM

## 2024-01-14 DIAGNOSIS — Z6830 Body mass index (BMI) 30.0-30.9, adult: Secondary | ICD-10-CM

## 2024-01-14 DIAGNOSIS — Z0000 Encounter for general adult medical examination without abnormal findings: Secondary | ICD-10-CM

## 2024-01-14 DIAGNOSIS — G8929 Other chronic pain: Secondary | ICD-10-CM

## 2024-01-14 DIAGNOSIS — E785 Hyperlipidemia, unspecified: Secondary | ICD-10-CM

## 2024-01-14 DIAGNOSIS — Z7984 Long term (current) use of oral hypoglycemic drugs: Secondary | ICD-10-CM

## 2024-01-14 DIAGNOSIS — Z125 Encounter for screening for malignant neoplasm of prostate: Secondary | ICD-10-CM

## 2024-01-14 DIAGNOSIS — M797 Fibromyalgia: Secondary | ICD-10-CM

## 2024-01-14 LAB — BAYER DCA HB A1C WAIVED: HB A1C (BAYER DCA - WAIVED): 7.2 % — ABNORMAL HIGH (ref 4.8–5.6)

## 2024-01-14 MED ORDER — HYDRALAZINE HCL 50 MG PO TABS: 50.0000 mg | ORAL_TABLET | Freq: Two times a day (BID) | ORAL | 3 refills | Status: AC

## 2024-01-14 MED ORDER — AMLODIPINE BESYLATE 10 MG PO TABS: 10.0000 mg | ORAL_TABLET | Freq: Every day | ORAL | 1 refills | Status: DC

## 2024-01-14 MED ORDER — SPIRONOLACTONE 25 MG PO TABS: ORAL_TABLET | ORAL | 1 refills | Status: DC

## 2024-01-14 MED ORDER — SITAGLIPTIN PHOSPHATE 100 MG PO TABS
ORAL_TABLET | ORAL | 1 refills | Status: DC
Start: 2024-01-14 — End: 2024-07-15

## 2024-01-14 MED ORDER — LOSARTAN POTASSIUM 100 MG PO TABS: ORAL_TABLET | ORAL | 1 refills | Status: DC

## 2024-01-14 MED ORDER — AMIODARONE HCL 200 MG PO TABS: 100.0000 mg | ORAL_TABLET | Freq: Every day | ORAL | 1 refills | Status: DC

## 2024-01-14 MED ORDER — ROSUVASTATIN CALCIUM 10 MG PO TABS: 10.0000 mg | ORAL_TABLET | Freq: Every day | ORAL | 1 refills | Status: DC

## 2024-01-14 MED ORDER — METFORMIN HCL 1000 MG PO TABS: ORAL_TABLET | ORAL | 1 refills | Status: DC

## 2024-01-14 MED ORDER — APIXABAN 5 MG PO TABS: ORAL_TABLET | ORAL | 1 refills | Status: DC

## 2024-01-14 NOTE — Patient Instructions (Signed)
 Fall Prevention in the Home, Adult Falls can cause injuries and can happen to people of all ages. There are many things you can do to make your home safer and to help prevent falls. What actions can I take to prevent falls? General information Use good lighting in all rooms. Make sure to: Replace any light bulbs that burn out. Turn on the lights in dark areas and use night-lights. Keep items that you use often in easy-to-reach places. Lower the shelves around your home if needed. Move furniture so that there are clear paths around it. Do not use throw rugs or other things on the floor that can make you trip. If any of your floors are uneven, fix them. Add color or contrast paint or tape to clearly mark and help you see: Grab bars or handrails. First and last steps of staircases. Where the edge of each step is. If you use a ladder or stepladder: Make sure that it is fully opened. Do not climb a closed ladder. Make sure the sides of the ladder are locked in place. Have someone hold the ladder while you use it. Know where your pets are as you move through your home. What can I do in the bathroom?     Keep the floor dry. Clean up any water on the floor right away. Remove soap buildup in the bathtub or shower. Buildup makes bathtubs and showers slippery. Use non-skid mats or decals on the floor of the bathtub or shower. Attach bath mats securely with double-sided, non-slip rug tape. If you need to sit down in the shower, use a non-slip stool. Install grab bars by the toilet and in the bathtub and shower. Do not use towel bars as grab bars. What can I do in the bedroom? Make sure that you have a light by your bed that is easy to reach. Do not use any sheets or blankets on your bed that hang to the floor. Have a firm chair or bench with side arms that you can use for support when you get dressed. What can I do in the kitchen? Clean up any spills right away. If you need to reach something  above you, use a step stool with a grab bar. Keep electrical cords out of the way. Do not use floor polish or wax that makes floors slippery. What can I do with my stairs? Do not leave anything on the stairs. Make sure that you have a light switch at the top and the bottom of the stairs. Make sure that there are handrails on both sides of the stairs. Fix handrails that are broken or loose. Install non-slip stair treads on all your stairs if they do not have carpet. Avoid having throw rugs at the top or bottom of the stairs. Choose a carpet that does not hide the edge of the steps on the stairs. Make sure that the carpet is firmly attached to the stairs. Fix carpet that is loose or worn. What can I do on the outside of my home? Use bright outdoor lighting. Fix the edges of walkways and driveways and fix any cracks. Clear paths of anything that can make you trip, such as tools or rocks. Add color or contrast paint or tape to clearly mark and help you see anything that might make you trip as you walk through a door, such as a raised step or threshold. Trim any bushes or trees on paths to your home. Check to see if handrails are loose  or broken and that both sides of all steps have handrails. Install guardrails along the edges of any raised decks and porches. Have leaves, snow, or ice cleared regularly. Use sand, salt, or ice melter on paths if you live where there is ice and snow during the winter. Clean up any spills in your garage right away. This includes grease or oil spills. What other actions can I take? Review your medicines with your doctor. Some medicines can cause dizziness or changes in blood pressure, which increase your risk of falling. Wear shoes that: Have a low heel. Do not wear high heels. Have rubber bottoms and are closed at the toe. Feel good on your feet and fit well. Use tools that help you move around if needed. These include: Canes. Walkers. Scooters. Crutches. Ask  your doctor what else you can do to help prevent falls. This may include seeing a physical therapist to learn to do exercises to move better and get stronger. Where to find more information Centers for Disease Control and Prevention, STEAD[employer]: TonerPromos.no [employer] on Aging: BaseRingTones.pl National Institute on Aging: BaseRingTones.pl Contact a doctor if: You are afraid of falling at home. You feel weak, drowsy, or dizzy at home. You fall at home. Get help right away if you: Lose consciousness or have trouble moving after a fall. Have a fall that causes a head injury. These symptoms may be an emergency. Get help right away. Call 911. Do not wait to see if the symptoms will go away. Do not drive yourself to the hospital. This information is not intended to replace advice given to you by your health care provider. Make sure you discuss any questions you have with your health care provider. Document Revised: 06/30/2022 Document Reviewed: 06/30/2022 Elsevier Patient Education  2024 [employer].

## 2024-01-14 NOTE — Progress Notes (Addendum)
 Subjective:    Patient ID: Michael Valenzuela, male    DOB: 05-18-1956, 68 y.o.   MRN: [id number]   Chief Complaint: annual physical    HPI:  Michael Valenzuela is a 68 y.o. who identifies as a male who was assigned male at birth.   Social history: Lives with: wife Work history: disability   Comes in today for follow up of the following chronic medical issues:  1. Essential hypertension, benign No c/o chest pain, sob or headache. Does not check blood pressure at home. BP Readings from Last 3 Encounters:  10/23/23 136/74  08/13/23 137/83  07/23/23 (!) 141/76     2. Hyperlipidemia with target LDL less than 100 Does not watch diet and does no exercise. Lab Results  Component Value Date   CHOL 146 07/23/2023   HDL 41 07/23/2023   LDLCALC 61 07/23/2023   TRIG 279 (H) 07/23/2023   CHOLHDL 3.6 07/23/2023     3. Diabetes mellitus treated with oral medication (HCC) He does not check his blood sugars very often. Lab Results  Component Value Date   HGBA1C 7.0 (H) 07/23/2023     4. Acute on chronic heart failure with preserved ejection fraction (HFpEF)/in the setting of severe mitral valve prolapse 5. Paroxysmal atrial fibrillation (HCC) Is on amiodrone and sees cardiology every 6 months. Last saw cardiology on  05/07/23- no changes made to plan of care  6. Chronic midline low back pain with sciatica, sciatica laterality unspecified 7. Fibromyalgia Sees pain management. Says they are really not helping with his pain but he continues to get meds monthly  8. Vitamin D deficiency Is on daily vitamin d supplement  9. BMI 30.0-30.9,adult No recent weight changes Wt Readings from Last 3 Encounters:  10/23/23 207 lb (93.9 kg)  08/13/23 207 lb (93.9 kg)  07/23/23 205 lb (93 kg)   BMI Readings from Last 3 Encounters:  10/23/23 28.47 kg/m  08/13/23 28.87 kg/m  07/23/23 28.59 kg/m      New complaints: Right breast pain- has been going on for over 5 months. Rates 8/10 when  touching it.  No Known Allergies Outpatient Encounter Medications as of 01/14/2024  Medication Sig   amiodarone (PACERONE) 200 MG tablet Take 0.5 tablets (100 mg total) by mouth daily.   amLODipine (NORVASC) 10 MG tablet Take 1 tablet (10 mg total) by mouth daily.   apixaban (ELIQUIS) 5 MG TABS tablet TAKE 1 TABLET(5 MG) BY MOUTH TWICE DAILY   cyclobenzaprine (FLEXERIL) 10 MG tablet TAKE 1 TABLET(10 MG) BY MOUTH THREE TIMES DAILY AS NEEDED FOR MUSCLE SPASMS   glucose blood (TRUETRACK TEST) test strip USE TO TEST BLOOD SUGAR ONCE DAILY   hydrALAZINE (APRESOLINE) 50 MG tablet Take 1 tablet (50 mg total) by mouth 2 (two) times daily.   losartan (COZAAR) 100 MG tablet TAKE 1 TABLET(100 MG) BY MOUTH DAILY   Menthol-Camphor (ICY HOT ADVANCED PAIN RELIEF) 16-11 % CREA Apply 1 application topically daily as needed (pain).   metFORMIN (GLUCOPHAGE) 1000 MG tablet TAKE 1 TABLET(1000 MG) BY MOUTH TWICE DAILY WITH A MEAL   Multiple Vitamins-Minerals (MULTIVITAMIN WITH MINERALS) tablet Take 1 tablet by mouth daily.   Oxycodone HCl 20 MG TABS Take 1 tablet by mouth every 6 (six) hours.   rosuvastatin (CRESTOR) 10 MG tablet Take 1 tablet (10 mg total) by mouth daily.   sitaGLIPtin (JANUVIA) 100 MG tablet TAKE 1 TABLET(100 MG) BY MOUTH DAILY   spironolactone (ALDACTONE) 25 MG tablet Take 1 tablet by  mouth once daily   Turmeric Curcumin 500 MG CAPS Take 500 mg by mouth daily.   No facility-administered encounter medications on file as of 01/14/2024.    Past Surgical History:  Procedure Laterality Date   CARDIOVERSION N/A 05/17/2021   Procedure: CARDIOVERSION;  Surgeon: Pricilla Riffle, MD;  Location: [facility];  Service: Cardiovascular;  Laterality: N/A;   HERNIA REPAIR     MAZE N/A 03/08/2021   Procedure: MAZE;  Surgeon: Linden Dolin, MD;  Location: [facility] OR;  Service: Open Heart Surgery;  Laterality: N/A;   MITRAL VALVE REPAIR N/A 03/08/2021   Procedure: MITRAL VALVE REPAIR USING  PHYSIO RING SIZE 28  (EXPLANTED) THEN MITRAL VALVE REPLACEMENT (MVR) WITH MAGNA EASE MITRAL VALVE SIZE  ON PUMP, LEFT ATRIAL APPENDAGE CLIPPING USING A SIZE 50 ATRIAL CLIP;  Surgeon: Linden Dolin, MD;  Location: [facility] OR;  Service: Open Heart Surgery;  Laterality: N/A;   RIGHT/LEFT HEART CATH AND CORONARY ANGIOGRAPHY N/A 03/05/2021   Procedure: RIGHT/LEFT HEART CATH AND CORONARY ANGIOGRAPHY;  Surgeon: Marykay Lex, MD;  Location: [facility] INVASIVE CV LAB;  Service: Cardiovascular;  Laterality: N/A;   TEE WITHOUT CARDIOVERSION N/A 03/05/2021   Procedure: TRANSESOPHAGEAL ECHOCARDIOGRAM (TEE);  Surgeon: Little Ishikawa, MD;  Location[facility] ENDOSCOPY;  Service: Cardiovascular;  Laterality: N/A;   TEE WITHOUT CARDIOVERSION N/A 03/08/2021   Procedure: TRANSESOPHAGEAL ECHOCARDIOGRAM (TEE);  Surgeon: Linden Dolin, MD;  Location: [facility] OR;  Service: Open Heart Surgery;  Laterality: N/A;   TEE WITHOUT CARDIOVERSION N/A 05/17/2021   Procedure: TRANSESOPHAGEAL ECHOCARDIOGRAM (TEE);  Surgeon: Pricilla Riffle, MD;  Location: [facility];  Service: Cardiovascular;  Laterality: N/A;    Family History  Problem Relation Age of Onset   Heart disease Mother    Diabetes Mother    Cancer Mother    Heart disease Father    Diabetes Son 51       type 1   Diabetes Paternal Uncle    Heart attack Brother    Hypertension Son       Controlled substance contract: n/a     Review of Systems  Constitutional:  Negative for diaphoresis.  Eyes:  Negative for pain.  Respiratory:  Negative for shortness of breath.   Cardiovascular:  Negative for chest pain, palpitations and leg swelling.  Gastrointestinal:  Negative for abdominal pain.  Endocrine: Negative for polydipsia.  Skin:  Negative for rash.  Neurological:  Negative for dizziness, weakness and headaches.  Hematological:  Does not bruise/bleed easily.  All other systems reviewed and are negative.      Objective:   Physical Exam Vitals and nursing note reviewed.   Constitutional:      Appearance: Normal appearance. He is well-developed.  HENT:     Head: Normocephalic.     Nose: Nose normal.     Mouth/Throat:     Mouth: Mucous membranes are moist.     Pharynx: Oropharynx is clear.  Eyes:     Pupils: Pupils are equal, round, and reactive to light.  Neck:     Thyroid: No thyroid mass or thyromegaly.     Vascular: No carotid bruit or JVD.     Trachea: Phonation normal.  Cardiovascular:     Rate and Rhythm: Normal rate and regular rhythm.  Pulmonary:     Effort: Pulmonary effort is normal. No respiratory distress.     Breath sounds: Normal breath sounds.  Chest:     Chest wall: Tenderness and edema present.  Breasts:  Right: No inverted nipple or nipple discharge.    Abdominal:     General: Bowel sounds are normal.     Palpations: Abdomen is soft.     Tenderness: There is no abdominal tenderness.  Musculoskeletal:        General: Normal range of motion.     Cervical back: Normal range of motion and neck supple.  Lymphadenopathy:     Cervical: No cervical adenopathy.  Skin:    General: Skin is warm and dry.  Neurological:     Mental Status: He is alert and oriented to person, place, and time.  Psychiatric:        Behavior: Behavior normal.        Thought Content: Thought content normal.        Judgment: Judgment normal.     BP 124/68   Pulse 61   Temp (!) 97.5 F (36.4 C) (Temporal)   Ht 5\' 11"  (1.803 m)   Wt 202 lb (91.6 kg)   SpO2 96%   BMI 28.17 kg/m    Hgba1c 7.2%      Assessment & Plan:  Michael Valenzuela comes in today with chief complaint of medical management of chronic issues    Diagnosis and orders addressed annual physical  1. Essential hypertension, benign Low sodium diet - CBC with Differential/Platelet - CMP14+EGFR - amLODipine (NORVASC) 10 MG tablet; Take 1 tablet (10 mg total) by mouth daily.  Dispense: 90 tablet; Refill: 1 - losartan (COZAAR) 100 MG tablet; TAKE 1 TABLET(100 MG) BY MOUTH DAILY   Dispense: 90 tablet; Refill: 1 - spironolactone (ALDACTONE) 25 MG tablet; Take 1 tablet by mouth once daily  Dispense: 90 tablet; Refill: 1  2. Hyperlipidemia with target LDL less than 100 Low fat diet - Lipid panel - rosuvastatin (CRESTOR) 10 MG tablet; Take 1 tablet (10 mg total) by mouth daily.  Dispense: 90 tablet; Refill: 1  3. Diabetes mellitus treated with oral medication (HCC) Continue to watch carbs in diet - Bayer DCA Hb A1c Waived - metFORMIN (GLUCOPHAGE) 1000 MG tablet; TAKE 1 TABLET(1000 MG) BY MOUTH TWICE DAILY WITH A MEAL  Dispense: 180 tablet; Refill: 1 - sitaGLIPtin (JANUVIA) 100 MG tablet; TAKE 1 TABLET(100 MG) BY MOUTH DAILY  Dispense: 90 tablet; Refill: 1  4. Acute on chronic heart failure with preserved ejection fraction (HFpEF)/in the setting of severe mitral valve prolapse 5. Paroxysmal atrial fibrillation (HCC) Keep follow up appointment with cardiology - amiodarone (PACERONE) 200 MG tablet; Take 0.5 tablets (100 mg total) by mouth daily.  Dispense: 45 tablet; Refill: 1 - apixaban (ELIQUIS) 5 MG TABS tablet; TAKE 1 TABLET(5 MG) BY MOUTH TWICE DAILY  Dispense: 180 tablet; Refill: 1  6. Chronic midline low back pain with sciatica, sciatica laterality unspecified 7. Fibromyalgia [facility]  8. Vitamin D deficiency Continue vitamin d supplement   9. BMI 30.0-30.9,adult Discussed diet and exercise for person with BMI >25 Will recheck weight in 3-6 months    Labs pending Health Maintenance reviewed Diet and exercise encouraged  Follow up plan: 6 months   Mary-Margaret Daphine Deutscher, FNP

## 2024-01-14 NOTE — Addendum Note (Signed)
 Addended by: Bennie Pierini on: 01/14/2024 11:29 AM   Modules accepted: Level of Service

## 2024-01-15 ENCOUNTER — Other Ambulatory Visit: Payer: Self-pay | Admitting: Nurse Practitioner

## 2024-01-15 DIAGNOSIS — N644 Mastodynia: Secondary | ICD-10-CM

## 2024-01-15 LAB — CBC WITH DIFFERENTIAL/PLATELET
Basophils Absolute: 0.1 10*3/uL (ref 0.0–0.2)
Basos: 1 %
EOS (ABSOLUTE): 0.5 10*3/uL — ABNORMAL HIGH (ref 0.0–0.4)
Eos: 9 %
Hematocrit: 43.2 % (ref 37.5–51.0)
Hemoglobin: 14.5 g/dL (ref 13.0–17.7)
Immature Grans (Abs): 0 10*3/uL (ref 0.0–0.1)
Immature Granulocytes: 0 %
Lymphocytes Absolute: 1.6 10*3/uL (ref 0.7–3.1)
Lymphs: 27 %
MCH: 31.1 pg (ref 26.6–33.0)
MCHC: 33.6 g/dL (ref 31.5–35.7)
MCV: 93 fL (ref 79–97)
Monocytes Absolute: 0.5 10*3/uL (ref 0.1–0.9)
Monocytes: 8 %
Neutrophils Absolute: 3.2 10*3/uL (ref 1.4–7.0)
Neutrophils: 55 %
Platelets: 197 10*3/uL (ref 150–450)
RBC: 4.66 x10E6/uL (ref 4.14–5.80)
RDW: 12.8 % (ref 11.6–15.4)
WBC: 5.8 10*3/uL (ref 3.4–10.8)

## 2024-01-15 LAB — LIPID PANEL
Chol/HDL Ratio: 3.3 ratio (ref 0.0–5.0)
Cholesterol, Total: 125 mg/dL (ref 100–199)
HDL: 38 mg/dL — ABNORMAL LOW (ref >39)
LDL Chol Calc (NIH): 41 mg/dL (ref 0–99)
Triglycerides: 302 mg/dL — ABNORMAL HIGH (ref 0–149)
VLDL Cholesterol Cal: 46 mg/dL — ABNORMAL HIGH (ref 5–40)

## 2024-01-15 LAB — CMP14+EGFR
ALT: 22 IU/L (ref 0–44)
AST: 22 IU/L (ref 0–40)
Albumin: 4.6 g/dL (ref 3.9–4.9)
Alkaline Phosphatase: 58 IU/L (ref 44–121)
BUN/Creatinine Ratio: 16 (ref 10–24)
BUN: 12 mg/dL (ref 8–27)
Bilirubin Total: 0.5 mg/dL (ref 0.0–1.2)
CO2: 23 mmol/L (ref 20–29)
Calcium: 9.7 mg/dL (ref 8.6–10.2)
Chloride: 100 mmol/L (ref 96–106)
Creatinine, Ser: 0.77 mg/dL (ref 0.76–1.27)
Globulin, Total: 2.6 g/dL (ref 1.5–4.5)
Glucose: 148 mg/dL — ABNORMAL HIGH (ref 70–99)
Potassium: 4.3 mmol/L (ref 3.5–5.2)
Sodium: 137 mmol/L (ref 134–144)
Total Protein: 7.2 g/dL (ref 6.0–8.5)
eGFR: 98 mL/min/{1.73_m2} (ref >59)

## 2024-01-15 LAB — THYROID PANEL WITH TSH
Free Thyroxine Index: 3.1 (ref 1.2–4.9)
T3 Uptake Ratio: 29 % (ref 24–39)
T4, Total: 10.8 ug/dL (ref 4.5–12.0)
TSH: 2.34 u[IU]/mL (ref 0.450–4.500)

## 2024-01-18 ENCOUNTER — Ambulatory Visit (INDEPENDENT_AMBULATORY_CARE_PROVIDER_SITE_OTHER): Payer: Medicare Other | Admitting: Otolaryngology

## 2024-01-18 ENCOUNTER — Encounter (INDEPENDENT_AMBULATORY_CARE_PROVIDER_SITE_OTHER): Payer: Self-pay

## 2024-01-18 VITALS — BP 118/64 | HR 60 | Ht 71.0 in | Wt 202.0 lb

## 2024-01-18 DIAGNOSIS — H6983 Other specified disorders of Eustachian tube, bilateral: Secondary | ICD-10-CM | POA: Diagnosis not present

## 2024-01-18 DIAGNOSIS — J310 Chronic rhinitis: Secondary | ICD-10-CM | POA: Diagnosis not present

## 2024-01-18 DIAGNOSIS — J343 Hypertrophy of nasal turbinates: Secondary | ICD-10-CM | POA: Insufficient documentation

## 2024-01-18 DIAGNOSIS — R0981 Nasal congestion: Secondary | ICD-10-CM

## 2024-01-18 DIAGNOSIS — H903 Sensorineural hearing loss, bilateral: Secondary | ICD-10-CM | POA: Insufficient documentation

## 2024-01-18 MED ORDER — FLUTICASONE PROPIONATE 50 MCG/ACT NA SUSP: 2.0000 | Freq: Every day | NASAL | 10 refills | Status: DC

## 2024-01-18 NOTE — Progress Notes (Signed)
 Patient ID: Michael Valenzuela, male   DOB: [birth year], 68 y.o.   MRN: [id number]  Follow-up: Hearing loss, eustachian tube dysfunction, chronic nasal obstruction  HPI: The patient is a 68 year old male who returns today for his follow-up evaluation.  The patient was last seen 1 year ago.  At that time, he was noted to have chronic rhinitis, with nasal mucosal congestion and bilateral inferior turbinate hypertrophy.  He was also noted to have stable bilateral high-frequency sensorineural hearing loss, worse on the left side.  The patient returns today complaining of occasional nasal congestion and clogging sensation in his ears.  He denies any recent sinusitis.  He also denies any change in his hearing.  Exam: General: Communicates without difficulty, well nourished, no acute distress. Head: Normocephalic, no evidence injury, no tenderness, facial buttresses intact without stepoff. Face/sinus: No tenderness to palpation and percussion. Facial movement is normal and symmetric. Eyes: PERRL, EOMI. No scleral icterus, conjunctivae clear. Neuro: CN II exam reveals vision grossly intact.  No nystagmus at any point of gaze. Ears: Auricles well formed without lesions.  Ear canals are intact without mass or lesion.  No erythema or edema is appreciated.  The TMs are intact without fluid. Nose: External evaluation reveals normal support and skin without lesions.  Dorsum is intact.  Anterior rhinoscopy reveals congested mucosa over anterior aspect of inferior turbinates and intact septum.  No purulence noted. Oral:  Oral cavity and oropharynx are intact, symmetric, without erythema or edema.  Mucosa is moist without lesions. Neck: Full range of motion without pain.  There is no significant lymphadenopathy.  No masses palpable.  Thyroid bed within normal limits to palpation.  Parotid glands and submandibular glands equal bilaterally without mass.  Trachea is midline. Neuro:  CN 2-12 grossly intact.   Assessment: 1.  Chronic  rhinitis with nasal mucosal congestion and bilateral inferior turbinate hypertrophy. 2.  Bilateral eustachian tube dysfunction. 3.  Subjectively stable bilateral high-frequency sensorineural hearing loss, worse on the left side.  Plan: 1.  The physical exam findings are reviewed with the patient. 2.  Continue the use of Flonase nasal spray 2 sprays each nostril daily. 3.  Valsalva exercise multiple times a day. 4.  The patient will return for reevaluation in 1 year, sooner if needed.  We will repeat his hearing test at that time.

## 2024-02-03 ENCOUNTER — Ambulatory Visit
Admission: RE | Admit: 2024-02-03 | Discharge: 2024-02-03 | Disposition: A | Discharge status: Home / Self Care | Source: Ambulatory Visit | Attending: Nurse Practitioner | Admitting: Nurse Practitioner

## 2024-02-03 ENCOUNTER — Ambulatory Visit

## 2024-02-03 DIAGNOSIS — N644 Mastodynia: Secondary | ICD-10-CM

## 2024-03-08 NOTE — Telephone Encounter (Signed)
 It is worth a try for shots- you may have to stop eliquis  for a few days prior to injection- need to ask doctor doing procedure

## 2024-04-29 ENCOUNTER — Encounter: Payer: Self-pay | Admitting: Cardiology

## 2024-04-29 ENCOUNTER — Ambulatory Visit: Payer: Medicare Other | Attending: Cardiology | Admitting: Cardiology

## 2024-04-29 VITALS — BP 118/72 | HR 72 | Ht 71.5 in | Wt 198.0 lb

## 2024-04-29 DIAGNOSIS — I480 Paroxysmal atrial fibrillation: Secondary | ICD-10-CM

## 2024-04-29 DIAGNOSIS — D6869 Other thrombophilia: Secondary | ICD-10-CM | POA: Diagnosis present

## 2024-04-29 DIAGNOSIS — I10 Essential (primary) hypertension: Secondary | ICD-10-CM | POA: Diagnosis present

## 2024-04-29 DIAGNOSIS — Z9889 Other specified postprocedural states: Secondary | ICD-10-CM | POA: Diagnosis present

## 2024-04-29 NOTE — Progress Notes (Signed)
 Clinical Summary Michael Valenzuela is a 68 y.o.male seen today for follow up of the following medical problems.    Mitral regurgitation s/p MVR - admission with diagnosis of severe MR 02/2021 - 03/08/2021 MVR with magna ease 25mm tissue valve along with LAA clipping and MAZE procedure - 04/2021 echo MVR mean gradient 10 mmHg.      05/2021 TEE: LVEF 55-60%, MV leaflets thin without restricted motion and no obstruction. Mean grade 5 mmHg, mild MR     - no SOB/DOE, no recent edema          2. Afib/aflutter - newly diagnosed during 02/2021 admission - s/p LAA clipping and MAZE procedure during his MVR surgery - has been on amio. LAVI is 44  05/17/2021 DCCV    - no palpitations - compliant with meds - on amio, 01/2024 normal TFTs and LFTs. Prior mild LFT elevation has resolved.    3. HTN - wants to avoid diuretic, already has chronic frequent urination.  - compliant with meds   4. Hyperlipidemia  04/2023 TC 137 TG 238 HDL 44 LDL 55 07/2023 TC 146 TG 279 HDL 41 LDL 61 - 01/2024 TC 536 TG 644 HDL 38 LDL 41   5. Chronic pain - chronic fibromylagia, followed by pain clinic. On long term opiates.    Past Medical History:  Diagnosis Date   Arthritis    Chronic low back pain    Diabetes mellitus without complication (HCC)    Fibromyalgia    Hyperlipidemia    Hypertension    Vitamin D deficiency      No Known Allergies   Current Outpatient Medications  Medication Sig Dispense Refill   amiodarone  (PACERONE ) 200 MG tablet Take 0.5 tablets (100 mg total) by mouth daily. 45 tablet 1   amLODipine  (NORVASC ) 10 MG tablet Take 1 tablet (10 mg total) by mouth daily. 90 tablet 1   apixaban  (ELIQUIS ) 5 MG TABS tablet TAKE 1 TABLET(5 MG) BY MOUTH TWICE DAILY 180 tablet 1   cyclobenzaprine  (FLEXERIL ) 10 MG tablet TAKE 1 TABLET(10 MG) BY MOUTH THREE TIMES DAILY AS NEEDED FOR MUSCLE SPASMS 270 tablet 0   fluticasone  (FLONASE ) 50 MCG/ACT nasal spray Place 2 sprays into both nostrils daily. 16  g 10   glucose blood (TRUETRACK TEST) test strip USE TO TEST BLOOD SUGAR ONCE DAILY 100 strip 3   hydrALAZINE  (APRESOLINE ) 50 MG tablet Take 1 tablet (50 mg total) by mouth 2 (two) times daily. 180 tablet 3   losartan  (COZAAR ) 100 MG tablet TAKE 1 TABLET(100 MG) BY MOUTH DAILY 90 tablet 1   Menthol -Camphor (ICY HOT ADVANCED PAIN RELIEF) 16-11 % CREA Apply 1 application topically daily as needed (pain).     metFORMIN  (GLUCOPHAGE ) 1000 MG tablet TAKE 1 TABLET(1000 MG) BY MOUTH TWICE DAILY WITH A MEAL 180 tablet 1   Multiple Vitamins-Minerals (MULTIVITAMIN WITH MINERALS) tablet Take 1 tablet by mouth daily.     Oxycodone  HCl 20 MG TABS Take 1 tablet by mouth every 6 (six) hours.     rosuvastatin  (CRESTOR ) 10 MG tablet Take 1 tablet (10 mg total) by mouth daily. 90 tablet 1   sitaGLIPtin  (JANUVIA ) 100 MG tablet TAKE 1 TABLET(100 MG) BY MOUTH DAILY 90 tablet 1   spironolactone  (ALDACTONE ) 25 MG tablet Take 1 tablet by mouth once daily 90 tablet 1   Turmeric Curcumin 500 MG CAPS Take 500 mg by mouth daily.     No current facility-administered medications for this visit.  Past Surgical History:  Procedure Laterality Date   CARDIOVERSION N/A 05/17/2021   Procedure: CARDIOVERSION;  Surgeon: Elmyra Haggard, MD;  Location: [facility];  Service: Cardiovascular;  Laterality: N/A;   HERNIA REPAIR     MAZE N/A 03/08/2021   Procedure: MAZE;  Surgeon: Rudine Cos, MD;  Location: [facility] OR;  Service: Open Heart Surgery;  Laterality: N/A;   MITRAL VALVE REPAIR N/A 03/08/2021   Procedure: MITRAL VALVE REPAIR USING  PHYSIO RING SIZE 28 (EXPLANTED) THEN MITRAL VALVE REPLACEMENT (MVR) WITH MAGNA EASE MITRAL VALVE SIZE  ON PUMP, LEFT ATRIAL APPENDAGE CLIPPING USING A SIZE 50 ATRIAL CLIP;  Surgeon: Rudine Cos, MD;  Location: [facility] OR;  Service: Open Heart Surgery;  Laterality: N/A;   RIGHT/LEFT HEART CATH AND CORONARY ANGIOGRAPHY N/A 03/05/2021   Procedure: RIGHT/LEFT HEART CATH AND CORONARY ANGIOGRAPHY;   Surgeon: Arleen Lacer, MD;  Location: [facility] INVASIVE CV LAB;  Service: Cardiovascular;  Laterality: N/A;   TEE WITHOUT CARDIOVERSION N/A 03/05/2021   Procedure: TRANSESOPHAGEAL ECHOCARDIOGRAM (TEE);  Surgeon: Wendie Hamburg, MD;  Location: [facility] ENDOSCOPY;  Service: Cardiovascular;  Laterality: N/A;   TEE WITHOUT CARDIOVERSION N/A 03/08/2021   Procedure: TRANSESOPHAGEAL ECHOCARDIOGRAM (TEE);  Surgeon: Rudine Cos, MD;  Location: [facility] OR;  Service: Open Heart Surgery;  Laterality: N/A;   TEE WITHOUT CARDIOVERSION N/A 05/17/2021   Procedure: TRANSESOPHAGEAL ECHOCARDIOGRAM (TEE);  Surgeon: Elmyra Haggard, MD;  Location: [facility];  Service: Cardiovascular;  Laterality: N/A;     No Known Allergies    Family History  Problem Relation Age of Onset   Heart disease Mother    Diabetes Mother    Cancer Mother    Heart disease Father    Diabetes Son 74       type 1   Diabetes Paternal Uncle    Heart attack Brother    Hypertension Son      Social History Michael Valenzuela reports that he has been smoking cigarettes. He started smoking about 25 years ago. He has a 5 pack-year smoking history. He has never been exposed to tobacco smoke. He has never used smokeless tobacco. Michael Valenzuela reports that he does not currently use alcohol .     Physical Examination Today's Vitals   04/29/24 0810  BP: 118/72  Pulse: 72  SpO2: 96%  Weight: 198 lb (89.8 kg)  Height: 5' 11.5 (1.816 m)   Body mass index is 27.23 kg/m.  Gen: resting comfortably, no acute distress HEENT: no scleral icterus, pupils equal round and reactive, no palptable cervical adenopathy,  CV:RRR, no m/rg, no jvd Resp: Clear to auscultation bilaterally GI: abdomen is soft, non-tender, non-distended, normal bowel sounds, no hepatosplenomegaly MSK: extremities are warm, no edema.  Skin: warm, no rash Neuro:  no focal deficits Psych: appropriate affect   Diagnostic Studies  TEE 03/05/21: IMPRESSIONS   1. Left ventricular  ejection fraction, by estimation, is 60 to 65%. The  left ventricle has normal function.   2. Right ventricular systolic function is normal. The right ventricular  size is mildly enlarged.   3. Left atrial size was severely dilated. No left atrial/left atrial  appendage thrombus was detected.   4. Right atrial size was mildly dilated.   5. The aortic valve is tricuspid. Aortic valve regurgitation is not  visualized.   6. The mitral valve is abnormal. Flail P2. Severe mitral valve  regurgitation. MR severe by both 2D ERO and 3D VCA. Systolic flow reversal  in pulmonary veins, consistent  with severe MR.   Cath 03/05/21: The left ventricular systolic function is normal. The left ventricular ejection fraction is 55-65% by visual estimate. LV end diastolic pressure is mildly elevated. There is moderate (3+) mitral regurgitation. Hemodynamic findings consistent with mild pulmonary hypertension. Angiographically normal coronary arteries   SUMMARY Angiographically Normal Coronary Arteries Mild Pulmonary Hypertension with mean PAP 30 mmHg, PCWP 5 mmHg with LVEDP 15 mmHg. Mild-moderately reduced Cardiac Output-Cardiac Index (Fick) 4.44-2.12.       05/2021 TEE IMPRESSIONS     1. Left ventricular ejection fraction, by estimation, is 55 to 60%. The  left ventricle has normal function.   2. Right ventricular systolic function is normal. The right ventricular  size is normal.   3. LA appendage has been clipped surgically No flow seen.   4. 25 mm Magna ease Mitral valve present (03/08/21) Valve is well seated.  Leaflets thin without significant restricted motion. No obstruction seen.  Peak and mean gradients through the valve are 13 and 5 mm Hg respectively  MVA by PT1/2 is 1.44 cm2. By  planimetry from 3 D images were 1.45 cm2. (HR 65bpm). Mild mitral valve  regurgitation.    Assessment and Plan   Severe MR s/p bioprosthetic MVR -no symptoms, doing well nearly 3 years out from valve  replacement - continue to monitor   2. Afib/aflutter/acquired thrombophilia - EKG shows back in afib today, asymptomatic and rate controlled. Continue amio at current dosing, if persistent afib at f/u or recurrent symptoms reconsider reloading. Will need EKG at next f/u.  - continue eliquis  for stroke prevention.    3. HTN - avoiding diuretic due to chronic issues already with frequent urination. Low heart rates avoid av nodal agents.  - bp is at goal, continue current meds     Laurann Pollock, M.D.

## 2024-04-29 NOTE — Patient Instructions (Signed)
 Medication Instructions:  Continue all current medications.   Labwork: none  Testing/Procedures: none  Follow-Up: 6 months   Any Other Special Instructions Will Be Listed Below (If Applicable).   If you need a refill on your cardiac medications before your next appointment, please call your pharmacy.

## 2024-05-06 ENCOUNTER — Ambulatory Visit (INDEPENDENT_AMBULATORY_CARE_PROVIDER_SITE_OTHER)

## 2024-05-06 VITALS — Ht 71.5 in | Wt 198.0 lb

## 2024-05-06 DIAGNOSIS — Z0000 Encounter for general adult medical examination without abnormal findings: Secondary | ICD-10-CM | POA: Diagnosis not present

## 2024-05-06 DIAGNOSIS — Z1211 Encounter for screening for malignant neoplasm of colon: Secondary | ICD-10-CM

## 2024-05-06 NOTE — Progress Notes (Signed)
 Subjective:   Michael Valenzuela is a 68 y.o. who presents for a Medicare Wellness preventive visit.  As a reminder, Annual Wellness Visits don't include a physical exam, and some assessments may be limited, especially if this visit is performed virtually. We may recommend an in-person follow-up visit with your provider if needed.  Visit Complete: Virtual I connected with  Michael Valenzuela on 05/06/24 by a audio enabled telemedicine application and verified that I am speaking with the correct person using two identifiers.  Patient Location: Home  Provider Location: Home Office  I discussed the limitations of evaluation and management by telemedicine. The patient expressed understanding and agreed to proceed.  Vital Signs: Because this visit was a virtual/telehealth visit, some criteria may be missing or patient reported. Any vitals not documented were not able to be obtained and vitals that have been documented are patient reported.  VideoDeclined- This patient declined Librarian, academic. Therefore the visit was completed with audio only.  Persons Participating in Visit: Patient.  AWV Questionnaire: No: Patient Medicare AWV questionnaire was not completed prior to this visit.  Cardiac Risk Factors include: advanced age (>85men, >52 women);diabetes mellitus;dyslipidemia;male gender;hypertension;sedentary lifestyle;smoking/ tobacco exposure     Objective:    Today's Vitals   05/06/24 0953  Weight: 198 lb (89.8 kg)  Height: 5' 11.5 (1.816 m)  PainSc: 7    Body mass index is 27.23 kg/m.     05/06/2024    9:55 AM 02/23/2023    8:42 AM 02/12/2022    9:45 AM 05/17/2021   10:59 AM 05/15/2021    1:33 PM 03/15/2021    7:41 AM 03/08/2021    4:00 PM  Advanced Directives  Does Patient Have a Medical Advance Directive? No No No No No No No  Would patient like information on creating a medical advance directive? Yes (MAU/Ambulatory/Procedural Areas - Information given) No -  Patient declined No - Patient declined No - Patient declined No - Patient declined  No - Patient declined    Current Medications (verified) Outpatient Encounter Medications as of 05/06/2024  Medication Sig   amiodarone  (PACERONE ) 200 MG tablet Take 0.5 tablets (100 mg total) by mouth daily.   amLODipine  (NORVASC ) 10 MG tablet Take 1 tablet (10 mg total) by mouth daily.   apixaban  (ELIQUIS ) 5 MG TABS tablet TAKE 1 TABLET(5 MG) BY MOUTH TWICE DAILY   cyclobenzaprine  (FLEXERIL ) 10 MG tablet TAKE 1 TABLET(10 MG) BY MOUTH THREE TIMES DAILY AS NEEDED FOR MUSCLE SPASMS   fluticasone  (FLONASE ) 50 MCG/ACT nasal spray Place 2 sprays into both nostrils daily.   glucose blood (TRUETRACK TEST) test strip USE TO TEST BLOOD SUGAR ONCE DAILY   hydrALAZINE  (APRESOLINE ) 50 MG tablet Take 1 tablet (50 mg total) by mouth 2 (two) times daily.   hydrOXYzine  (ATARAX ) 10 MG tablet Take 10 mg by mouth at bedtime.   losartan  (COZAAR ) 100 MG tablet TAKE 1 TABLET(100 MG) BY MOUTH DAILY   Menthol -Camphor (ICY HOT ADVANCED PAIN RELIEF) 16-11 % CREA Apply 1 application topically daily as needed (pain).   metFORMIN  (GLUCOPHAGE ) 1000 MG tablet TAKE 1 TABLET(1000 MG) BY MOUTH TWICE DAILY WITH A MEAL   Multiple Vitamins-Minerals (MULTIVITAMIN WITH MINERALS) tablet Take 1 tablet by mouth daily.   Oxycodone  HCl 20 MG TABS Take 1 tablet by mouth every 6 (six) hours.   rosuvastatin  (CRESTOR ) 10 MG tablet Take 1 tablet (10 mg total) by mouth daily.   sitaGLIPtin  (JANUVIA ) 100 MG tablet TAKE 1 TABLET(100 MG)  BY MOUTH DAILY   spironolactone  (ALDACTONE ) 25 MG tablet Take 1 tablet by mouth once daily   Turmeric Curcumin 500 MG CAPS Take 500 mg by mouth daily.   No facility-administered encounter medications on file as of 05/06/2024.    Allergies (verified) Patient has no known allergies.   History: Past Medical History:  Diagnosis Date   Arthritis    Chronic low back pain    Diabetes mellitus without complication (HCC)     Fibromyalgia    Hyperlipidemia    Hypertension    Vitamin D deficiency    Past Surgical History:  Procedure Laterality Date   CARDIOVERSION N/A 05/17/2021   Procedure: CARDIOVERSION;  Surgeon: Okey Vina GAILS, MD;  Location: [facility];  Service: Cardiovascular;  Laterality: N/A;   HERNIA REPAIR     MAZE N/A 03/08/2021   Procedure: MAZE;  Surgeon: German Bartlett PEDLAR, MD;  Location: [facility] OR;  Service: Open Heart Surgery;  Laterality: N/A;   MITRAL VALVE REPAIR N/A 03/08/2021   Procedure: MITRAL VALVE REPAIR USING  PHYSIO RING SIZE 28 (EXPLANTED) THEN MITRAL VALVE REPLACEMENT (MVR) WITH MAGNA EASE MITRAL VALVE SIZE  ON PUMP, LEFT ATRIAL APPENDAGE CLIPPING USING A SIZE 50 ATRIAL CLIP;  Surgeon: German Bartlett PEDLAR, MD;  Location: [facility] OR;  Service: Open Heart Surgery;  Laterality: N/A;   RIGHT/LEFT HEART CATH AND CORONARY ANGIOGRAPHY N/A 03/05/2021   Procedure: RIGHT/LEFT HEART CATH AND CORONARY ANGIOGRAPHY;  Surgeon: Anner Alm ORN, MD;  Location: [facility] INVASIVE CV LAB;  Service: Cardiovascular;  Laterality: N/A;   TEE WITHOUT CARDIOVERSION N/A 03/05/2021   Procedure: TRANSESOPHAGEAL ECHOCARDIOGRAM (TEE);  Surgeon: Kate Lonni CROME, MD;  Location: [facility] ENDOSCOPY;  Service: Cardiovascular;  Laterality: N/A;   TEE WITHOUT CARDIOVERSION N/A 03/08/2021   Procedure: TRANSESOPHAGEAL ECHOCARDIOGRAM (TEE);  Surgeon: German Bartlett PEDLAR, MD;  Location: [facility] OR;  Service: Open Heart Surgery;  Laterality: N/A;   TEE WITHOUT CARDIOVERSION N/A 05/17/2021   Procedure: TRANSESOPHAGEAL ECHOCARDIOGRAM (TEE);  Surgeon: Okey Vina GAILS, MD;  Location: [facility];  Service: Cardiovascular;  Laterality: N/A;   Family History  Problem Relation Age of Onset   Heart disease Mother    Diabetes Mother    Cancer Mother    Heart disease Father    Diabetes Son 23       type 1   Diabetes Paternal Uncle    Heart attack Brother    Hypertension Son    Social History   Socioeconomic History   Marital status: Married    Spouse name:  Arland   Number of children: 2   Years of education: 13   Highest education level: Some college, no degree  Occupational History   Occupation: Retired    Associate Professor: [employer]   Occupation: disabled  Tobacco Use   Smoking status: Some Days    Current packs/day: 0.00    Average packs/day: 0.3 packs/day for 20.0 years (5.0 ttl pk-yrs)    Types: Cigarettes    Start date: 02/09/1999    Last attempt to quit: 02/09/2019    Years since quitting: 5.2    Passive exposure: Never   Smokeless tobacco: Never  Vaping Use   Vaping status: Never Used  Substance and Sexual Activity   Alcohol  use: Not Currently    Comment: occasional   Drug use: No   Sexual activity: Not Currently  Other Topics Concern   Not on file  Social History Narrative   Patient has fibromyalgia and has significant pain with that. He  has two adult children and is married. He has one grandchild.   Both sons live next door    Previously coached sports    Social Drivers of Health   Financial Resource Strain: Low Risk  (05/06/2024)   Overall Financial Resource Strain (CARDIA)    Difficulty of Paying Living Expenses: Not hard at all  Food Insecurity: No Food Insecurity (05/06/2024)   Hunger Vital Sign    Worried About Running Out of Food in the Last Year: Never true    Ran Out of Food in the Last Year: Never true  Transportation Needs: No Transportation Needs (05/06/2024)   PRAPARE - Administrator, Civil Service (Medical): No    Lack of Transportation (Non-Medical): No  Physical Activity: Inactive (05/06/2024)   Exercise Vital Sign    Days of Exercise per Week: 0 days    Minutes of Exercise per Session: 0 min  Stress: No Stress Concern Present (05/06/2024)   [employer] of Occupational Health - Occupational Stress Questionnaire    Feeling of Stress: Not at all  Social Connections: Moderately Isolated (05/06/2024)   Social Connection and Isolation Panel    Frequency of Communication with Friends and  Family: More than three times a week    Frequency of Social Gatherings with Friends and Family: More than three times a week    Attends Religious Services: Never    Database administrator or Organizations: No    Attends Engineer, structural: Never    Marital Status: Married    Tobacco Counseling Ready to quit: Yes Counseling given: Yes    Clinical Intake:  Pre-visit preparation completed: Yes  Pain : 0-10 Pain Score: 7  Pain Type: Chronic pain Pain Location: Back Pain Orientation: Lower Pain Descriptors / Indicators: Constant Pain Onset: More than a month ago Pain Frequency: Constant     BMI - recorded: 27.23 Nutritional Status: BMI 25 -29 Overweight Nutritional Risks: None Diabetes: Yes CBG done?: No (telehealth visit. unable to obtain) Did pt. bring in CBG monitor from home?: No  Lab Results  Component Value Date   HGBA1C 7.2 (H) 01/14/2024   HGBA1C 7.0 (H) 07/23/2023   HGBA1C 6.5 (H) 04/21/2023     How often do you need to have someone help you when you read instructions, pamphlets, or other written materials from your doctor or pharmacy?: 1 - Never  Interpreter Needed?: No  Information entered by :: Stefano ORN CMA   Activities of Daily Living     05/06/2024    9:55 AM  In your present state of health, do you have any difficulty performing the following activities:  Hearing? 0  Vision? 0  Difficulty concentrating or making decisions? 0  Walking or climbing stairs? 1  Dressing or bathing? 0  Doing errands, shopping? 0  Preparing Food and eating ? N  Using the Toilet? N  In the past six months, have you accidently leaked urine? N  Do you have problems with loss of bowel control? N  Managing your Medications? N  Managing your Finances? N  Housekeeping or managing your Housekeeping? N    Patient Care Team: Gladis Mustard, FNP as PCP - General (Nurse Practitioner) [facility] (Pain Medicine) Alvan Dorn FALCON, MD as  Consulting Physician (Cardiology) [facility] as Consulting Physician (Ophthalmology) Karis Clunes, MD as Consulting Physician (Otolaryngology)  I have updated your Care Teams any recent Medical Services you may have received from other providers in the  past year.     Assessment:   This is a routine wellness examination for Michael Valenzuela.  Hearing/Vision screen Hearing Screening - Comments:: Patient denies any hearing difficulties.   Vision Screening - Comments:: Wears rx glasses - up to date with routine eye exams with  [facility]   Goals Addressed               This Visit's Progress     Patient Stated   On track     Get control of pain      Patient Stated (pt-stated)        Remain as active and healthy as possible.        Depression Screen     05/06/2024    9:58 AM 01/14/2024   10:59 AM 07/23/2023   11:32 AM 04/21/2023   11:24 AM 02/23/2023    8:40 AM 10/16/2022   11:16 AM 10/16/2022   11:06 AM  PHQ 2/9 Scores  PHQ - 2 Score 0 4 3 1  0 2 2  PHQ- 9 Score 0 9 6 3  7 7     Fall Risk     05/06/2024    9:57 AM 01/14/2024   10:59 AM 08/13/2023   12:58 PM 07/23/2023   11:32 AM 04/21/2023   11:24 AM  Fall Risk   Falls in the past year? 0 0 0 0 0  Number falls in past yr: 0      Injury with Fall? 0      Risk for fall due to : No Fall Risks      Follow up Falls evaluation completed        MEDICARE RISK AT HOME:  Medicare Risk at Home Any stairs in or around the home?: Yes If so, are there any without handrails?: No Home free of loose throw rugs in walkways, pet beds, electrical cords, etc?: Yes Adequate lighting in your home to reduce risk of falls?: Yes Life alert?: No Use of a cane, walker or w/c?: No Grab bars in the bathroom?: No Shower chair or bench in shower?: No Elevated toilet seat or a handicapped toilet?: No  TIMED UP AND GO:  Was the test performed?  No  Cognitive Function: 6CIT completed    01/24/2019    2:13 PM 12/31/2017     9:24 AM  MMSE - Mini Mental State Exam  Orientation to time 5 5   Orientation to Place 5 5   Registration 3 3   Attention/ Calculation 4 4   Recall 2 2   Language- name 2 objects 2 2   Language- repeat 1 1  Language- follow 3 step command 3 3   Language- read & follow direction 1 1   Write a sentence 1 1   Copy design 1 0   Total score 28 27      Data saved with a previous flowsheet row definition        05/06/2024    9:57 AM 02/23/2023    8:43 AM 02/12/2022    9:52 AM 02/09/2020    8:46 AM  6CIT Screen  What Year? 0 points 0 points 0 points 0 points  What month? 0 points 0 points 0 points 0 points  What time? 0 points 0 points 0 points 0 points  Count back from 20 0 points 0 points 0 points 0 points  Months in reverse 0 points 0 points 2 points 0 points  Repeat phrase 0 points 0 points  2 points 0 points  Total Score 0 points 0 points 4 points 0 points    Immunizations Immunization History  Administered Date(s) Administered   Fluad Quad(high Dose 65+) 09/27/2020, 10/21/2021, 10/17/2022   Fluad Trivalent(High Dose 65+) 07/23/2023   Influenza,inj,Quad PF,6+ Mos 10/06/2016, 10/08/2017, 10/11/2018, 07/15/2019   PFIZER(Purple Top)SARS-COV-2 Vaccination 02/23/2020, 03/15/2020, 11/09/2020   Pneumococcal Conjugate-13 12/31/2015, 04/15/2022   Pneumococcal Polysaccharide-23 01/06/2017   Tdap 03/19/2015   Tetanus 06/25/2004   Zoster Recombinant(Shingrix ) 07/22/2021, 04/21/2023   Zoster, Live 09/25/2015    Screening Tests Health Maintenance  Topic Date Due   Colonoscopy  03/17/2022   COVID-19 Vaccine (4 - 2024-25 season) 07/12/2023   FOOT EXAM  10/17/2023   Diabetic kidney evaluation - Urine ACR  04/20/2024   INFLUENZA VACCINE  06/10/2024   HEMOGLOBIN A1C  07/16/2024   OPHTHALMOLOGY EXAM  12/28/2024   Diabetic kidney evaluation - eGFR measurement  01/13/2025   DTaP/Tdap/Td (2 - Td or Tdap) 03/18/2025   Medicare Annual Wellness (AWV)  05/06/2025   Pneumococcal Vaccine: 50+  Years (3 of 3 - PCV20 or PCV21) 04/16/2027   Hepatitis C Screening  Completed   Zoster Vaccines- Shingrix   Completed   Hepatitis B Vaccines  Aged Out   HPV VACCINES  Aged Out   Meningococcal B Vaccine  Aged Out    Health Maintenance  Health Maintenance Due  Topic Date Due   Colonoscopy  03/17/2022   COVID-19 Vaccine (4 - 2024-25 season) 07/12/2023   FOOT EXAM  10/17/2023   Diabetic kidney evaluation - Urine ACR  04/20/2024   Health Maintenance Items Addressed: Referral sent to GI for colonoscopy, Labs Ordered: Diabetic kidney function Urine ACR, eye exam requested from [facility]  Additional Screening:  Vision Screening: Recommended annual ophthalmology exams for early detection of glaucoma and other disorders of the eye. Would you like a referral to an eye doctor? No    Dental Screening: Recommended annual dental exams for proper oral hygiene  Community Resource Referral / Chronic Care Management: CRR required this visit?  No   CCM required this visit?  No   Plan:    I have personally reviewed and noted the following in the patient's chart:   Medical and social history Use of alcohol , tobacco or illicit drugs  Current medications and supplements including opioid prescriptions. Patient is currently taking opioid prescriptions. Information provided to patient regarding non-opioid alternatives. Patient advised to discuss non-opioid treatment plan with their provider. Functional ability and status Nutritional status Physical activity Advanced directives List of other physicians Hospitalizations, surgeries, and ER visits in previous 12 months Vitals Screenings to include cognitive, depression, and falls Referrals and appointments  In addition, I have reviewed and discussed with patient certain preventive protocols, quality metrics, and best practice recommendations. A written personalized care plan for preventive services as well as general preventive  health recommendations were provided to patient.   Gwendolyne Welford, CMA   05/06/2024   After Visit Summary: (MyChart) Due to this being a telephonic visit, the after visit summary with patients personalized plan was offered to patient via MyChart   Notes: Nothing significant to report at this time.

## 2024-05-06 NOTE — Patient Instructions (Signed)
 Michael Valenzuela , Thank you for taking time out of your busy schedule to complete your Annual Wellness Visit with me. I enjoyed our conversation and look forward to speaking with you again next year. I, as well as your care team,  appreciate your ongoing commitment to your health goals. Please review the following plan we discussed and let me know if I can assist you in the future.  Your Game plan/ To Do List     Orders Placed:  [place] no longer has a gastroenterologist so the referral has been placed for [place].   [facility] at  [address] Phone: [phone]   Follow up Visits: Next Office Visit with your Primary Care Doctor:  July 15, 2024 in office Next Medicare AWV with clinical staff: May 09, 2025 at 8:40 am telephone visit Clinician Recommendations:   Aim for 30 minutes of exercise or brisk walking, 6-8 glasses of water, and 5 servings of fruits and vegetables each day.  I enjoyed our conversation today and look forward to talking with you again next year!! Have a wonderful and safe year.  All the best, Dragon Thrush      This is a list of the screening recommended for you and due dates:  Health Maintenance  Topic Date Due   Colon Cancer Screening  03/17/2022   COVID-19 Vaccine (4 - 2024-25 season) 07/12/2023   Complete foot exam   10/17/2023   Yearly kidney health urinalysis for diabetes  04/20/2024   Flu Shot  06/10/2024   Hemoglobin A1C  07/16/2024   Eye exam for diabetics  12/28/2024   Yearly kidney function blood test for diabetes  01/13/2025   DTaP/Tdap/Td vaccine (2 - Td or Tdap) 03/18/2025   Medicare Annual Wellness Visit  05/06/2025   Pneumococcal Vaccine for age over 42 (3 of 3 - PCV20 or PCV21) 04/16/2027   Hepatitis C Screening  Completed   Zoster (Shingles) Vaccine  Completed   Hepatitis B Vaccine  Aged Out   HPV Vaccine  Aged Out   Meningitis B Vaccine  Aged Out    Advanced directives: (Provided) Advance  directive discussed with you today. I have provided a copy for you to complete at home and have notarized. Once this is complete, please bring a copy in to our office so we can scan it into your chart.  Advance Care Planning is important because it:  [x]  Makes sure you receive the medical care that is consistent with your values, goals, and preferences  [x]  It provides guidance to your family and loved ones and reduces their decisional burden about whether or not they are making the right decisions based on your wishes.  Follow the link provided in your after visit summary or read over the paperwork we have mailed to you to help you started getting your Advance Directives in place. If you need assistance in completing these, please reach out to us  so that we can help you!  See attachments for Preventive Care and Fall Prevention Tips.   Managing Pain Without Opioids  Opioids are strong medicines used to treat moderate to severe pain. For some people, especially those who have long-term (chronic) pain, opioids may not be the best choice for pain management due to: Side effects like nausea, constipation, and sleepiness. The risk of addiction (opioid use disorder). The longer you take opioids, the greater your risk of addiction. Pain that lasts for more than 3 months is called chronic pain.  Managing chronic pain usually requires more than one approach and is often provided by a team of health care providers working together (multidisciplinary approach). Pain management may be done at a pain management center or pain clinic. How to manage pain without the use of opioids Use non-opioid medicines Non-opioid medicines for pain may include: Over-the-counter or prescription non-steroidal anti-inflammatory drugs (NSAIDs). These may be the first medicines used for pain. They work well for muscle and bone pain, and they reduce swelling. Acetaminophen . This over-the-counter medicine may work well for milder  pain but not swelling. Antidepressants. These may be used to treat chronic pain. A certain type of antidepressant (tricyclics) is often used. These medicines are given in lower doses for pain than when used for depression. Anticonvulsants. These are usually used to treat seizures but may also reduce nerve (neuropathic) pain. Muscle relaxants. These relieve pain caused by sudden muscle tightening (spasms). You may also use a pain medicine that is applied to the skin as a patch, cream, or gel (topical analgesic), such as a numbing medicine. These may cause fewer side effects than medicines taken by mouth. Do certain therapies as directed Some therapies can help with pain management. They include: Physical therapy. You will do exercises to gain strength and flexibility. A physical therapist may teach you exercises to move and stretch parts of your body that are weak, stiff, or painful. You can learn these exercises at physical therapy visits and practice them at home. Physical therapy may also involve: Massage. Heat wraps or applying heat or cold to affected areas. Electrical signals that interrupt pain signals (transcutaneous electrical nerve stimulation, TENS). Weak lasers that reduce pain and swelling (low-level laser therapy). Signals from your body that help you learn to regulate pain (biofeedback). Occupational therapy. This helps you to learn ways to function at home and work with less pain. Recreational therapy. This involves trying new activities or hobbies, such as a physical activity or drawing. Mental health therapy, including: Cognitive behavioral therapy (CBT). This helps you learn coping skills for dealing with pain. Acceptance and commitment therapy (ACT) to change the way you think and react to pain. Relaxation therapies, including muscle relaxation exercises and mindfulness-based stress reduction. Pain management counseling. This may be individual, family, or group  counseling.  Receive medical treatments Medical treatments for pain management include: Nerve block injections. These may include a pain blocker and anti-inflammatory medicines. You may have injections: Near the spine to relieve chronic back or neck pain. Into joints to relieve back or joint pain. Into nerve areas that supply a painful area to relieve body pain. Into muscles (trigger point injections) to relieve some painful muscle conditions. A medical device placed near your spine to help block pain signals and relieve nerve pain or chronic back pain (spinal cord stimulation device). Acupuncture. Follow these instructions at home Medicines Take over-the-counter and prescription medicines only as told by your health care provider. If you are taking pain medicine, ask your health care providers about possible side effects to watch out for. Do not drive or use heavy machinery while taking prescription opioid pain medicine. Lifestyle  Do not use drugs or alcohol  to reduce pain. If you drink alcohol , limit how much you have to: 0-1 drink a day for women who are not pregnant. 0-2 drinks a day for men. Know how much alcohol  is in a drink. In the [place] one drink equals one 12 oz bottle of beer (355 mL), one 5 oz glass of wine (148 mL), or  one 1 oz glass of hard liquor (44 mL). Do not use any products that contain nicotine or tobacco. These products include cigarettes, chewing tobacco, and vaping devices, such as e-cigarettes. If you need help quitting, ask your health care provider. Eat a healthy diet and maintain a healthy weight. Poor diet and excess weight may make pain worse. Eat foods that are high in fiber. These include fresh fruits and vegetables, whole grains, and beans. Limit foods that are high in fat and processed sugars, such as fried and sweet foods. Exercise regularly. Exercise lowers stress and may help relieve pain. Ask your health care provider what activities and exercises  are safe for you. If your health care provider approves, join an exercise class that combines movement and stress reduction. Examples include yoga and tai chi. Get enough sleep. Lack of sleep may make pain worse. Lower stress as much as possible. Practice stress reduction techniques as told by your therapist. General instructions Work with all your pain management providers to find the treatments that work best for you. You are an important member of your pain management team. There are many things you can do to reduce pain on your own. Consider joining an online or in-person support group for people who have chronic pain. Keep all follow-up visits. This is important. Where to find more information You can find more information about managing pain without opioids from: American Academy of Pain Medicine: painmed.org Institute for Chronic Pain: instituteforchronicpain.org American Chronic Pain Association: theacpa.org Contact a health care provider if: You have side effects from pain medicine. Your pain gets worse or does not get better with treatments or home therapy. You are struggling with anxiety or depression. Summary Many types of pain can be managed without opioids. Chronic pain may respond better to pain management without opioids. Pain is best managed when you and a team of health care providers work together. Pain management without opioids may include non-opioid medicines, medical treatments, physical therapy, mental health therapy, and lifestyle changes. Tell your health care providers if your pain gets worse or is not being managed well enough. This information is not intended to replace advice given to you by your health care provider. Make sure you discuss any questions you have with your health care provider. Document Revised: 02/06/2021 Document Reviewed: 02/06/2021 Elsevier Patient Education  2023 [employer]. Understanding Your Risk for Falls Millions of people have  serious injuries from falls each year. It is important to understand your risk of falling. Talk with your health care provider about your risk and what you can do to lower it. If you do have a serious fall, make sure to tell your provider. Falling once raises your risk of falling again. How can falls affect me? Serious injuries from falls are common. These include: Broken bones, such as hip fractures. Head injuries, such as traumatic brain injuries (TBI) or concussions. A fear of falling can cause you to avoid activities and stay at home. This can make your muscles weaker and raise your risk for a fall. What can increase my risk? There are a number of risk factors that increase your risk for falling. The more risk factors you have, the higher your risk of falling. Serious injuries from a fall happen most often to people who are older than 69 years old. Teenagers and young adults ages 26-29 are also at higher risk. Common risk factors include: Weakness in the lower body. Being generally weak or confused due to long-term (chronic) illness. Dizziness  or balance problems. Poor vision. Medicines that cause dizziness or drowsiness. These may include: Medicines for your blood pressure, heart, anxiety, insomnia, or swelling (edema). Pain medicines. Muscle relaxants. Other risk factors include: Drinking alcohol . Having had a fall in the past. Having foot pain or wearing improper footwear. Working at a dangerous job. Having any of the following in your home: Tripping hazards, such as floor clutter or loose rugs. Poor lighting. Pets. Having dementia or memory loss. What actions can I take to lower my risk of falling?     Physical activity Stay physically fit. Do strength and balance exercises. Consider taking a regular class to build strength and balance. Yoga and tai chi are good options. Vision Have your eyes checked every year and your prescription for glasses or contacts updated as  needed. Shoes and walking aids Wear non-skid shoes. Wear shoes that have rubber soles and low heels. Do not wear high heels. Do not walk around the house in socks or slippers. Use a cane or walker as told by your provider. Home safety Attach secure railings on both sides of your stairs. Install grab bars for your bathtub, shower, and toilet. Use a non-skid mat in your bathtub or shower. Attach bath mats securely with double-sided, non-slip rug tape. Use good lighting in all rooms. Keep a flashlight near your bed. Make sure there is a clear path from your bed to the bathroom. Use night-lights. Do not use throw rugs. Make sure all carpeting is taped or tacked down securely. Remove all clutter from walkways and stairways, including extension cords. Repair uneven or broken steps and floors. Avoid walking on icy or slippery surfaces. Walk on the grass instead of on icy or slick sidewalks. Use ice melter to get rid of ice on walkways in the winter. Use a cordless phone. Questions to ask your health care provider Can you help me check my risk for a fall? Do any of my medicines make me more likely to fall? Should I take a vitamin D supplement? What exercises can I do to improve my strength and balance? Should I make an appointment to have my vision checked? Do I need a bone density test to check for weak bones (osteoporosis)? Would it help to use a cane or a walker? Where to find more information Centers for Disease Control and Prevention, STEADI: TonerPromos.no Community-Based Fall Prevention Programs: TonerPromos.no [employer] on Aging: BaseRingTones.pl Contact a health care provider if: You fall at home. You are afraid of falling at home. You feel weak, drowsy, or dizzy. This information is not intended to replace advice given to you by your health care provider. Make sure you discuss any questions you have with your health care provider. Document Revised: 06/30/2022 Document Reviewed:  06/30/2022 Elsevier Patient Education  2024 [employer].

## 2024-05-16 ENCOUNTER — Encounter: Payer: Self-pay | Admitting: Gastroenterology

## 2024-05-28 NOTE — Progress Notes (Unsigned)
 GI Office Note    Referring Provider: Gladis Mustard, * Primary Care Physician:  Gladis Mustard, FNP  Primary Gastroenterologist:  Chief Complaint   No chief complaint on file.    History of Present Illness   Michael Valenzuela is a 68 y.o. male presenting today          Medications   Current Outpatient Medications  Medication Sig Dispense Refill   amiodarone  (PACERONE ) 200 MG tablet Take 0.5 tablets (100 mg total) by mouth daily. 45 tablet 1   amLODipine  (NORVASC ) 10 MG tablet Take 1 tablet (10 mg total) by mouth daily. 90 tablet 1   apixaban  (ELIQUIS ) 5 MG TABS tablet TAKE 1 TABLET(5 MG) BY MOUTH TWICE DAILY 180 tablet 1   cyclobenzaprine  (FLEXERIL ) 10 MG tablet TAKE 1 TABLET(10 MG) BY MOUTH THREE TIMES DAILY AS NEEDED FOR MUSCLE SPASMS 270 tablet 0   fluticasone  (FLONASE ) 50 MCG/ACT nasal spray Place 2 sprays into both nostrils daily. 16 g 10   glucose blood (TRUETRACK TEST) test strip USE TO TEST BLOOD SUGAR ONCE DAILY 100 strip 3   hydrALAZINE  (APRESOLINE ) 50 MG tablet Take 1 tablet (50 mg total) by mouth 2 (two) times daily. 180 tablet 3   hydrOXYzine  (ATARAX ) 10 MG tablet Take 10 mg by mouth at bedtime.     losartan  (COZAAR ) 100 MG tablet TAKE 1 TABLET(100 MG) BY MOUTH DAILY 90 tablet 1   Menthol -Camphor (ICY HOT ADVANCED PAIN RELIEF) 16-11 % CREA Apply 1 application topically daily as needed (pain).     metFORMIN  (GLUCOPHAGE ) 1000 MG tablet TAKE 1 TABLET(1000 MG) BY MOUTH TWICE DAILY WITH A MEAL 180 tablet 1   Multiple Vitamins-Minerals (MULTIVITAMIN WITH MINERALS) tablet Take 1 tablet by mouth daily.     Oxycodone  HCl 20 MG TABS Take 1 tablet by mouth every 6 (six) hours.     rosuvastatin  (CRESTOR ) 10 MG tablet Take 1 tablet (10 mg total) by mouth daily. 90 tablet 1   sitaGLIPtin  (JANUVIA ) 100 MG tablet TAKE 1 TABLET(100 MG) BY MOUTH DAILY 90 tablet 1   spironolactone  (ALDACTONE ) 25 MG tablet Take 1 tablet by mouth once daily 90 tablet 1   Turmeric  Curcumin 500 MG CAPS Take 500 mg by mouth daily.     No current facility-administered medications for this visit.    Allergies   Allergies as of 05/30/2024   (No Known Allergies)    Past Medical History   Past Medical History:  Diagnosis Date   Arthritis    Chronic low back pain    Diabetes mellitus without complication (HCC)    Fibromyalgia    Hyperlipidemia    Hypertension    Vitamin D deficiency     Past Surgical History   Past Surgical History:  Procedure Laterality Date   CARDIOVERSION N/A 05/17/2021   Procedure: CARDIOVERSION;  Surgeon: Okey Vina GAILS, MD;  Location: [facility];  Service: Cardiovascular;  Laterality: N/A;   HERNIA REPAIR     MAZE N/A 03/08/2021   Procedure: MAZE;  Surgeon: German Bartlett PEDLAR, MD;  Location: [facility] OR;  Service: Open Heart Surgery;  Laterality: N/A;   MITRAL VALVE REPAIR N/A 03/08/2021   Procedure: MITRAL VALVE REPAIR USING  PHYSIO RING SIZE 28 (EXPLANTED) THEN MITRAL VALVE REPLACEMENT (MVR) WITH MAGNA EASE MITRAL VALVE SIZE  ON PUMP, LEFT ATRIAL APPENDAGE CLIPPING USING A SIZE 50 ATRIAL CLIP;  Surgeon: German Bartlett PEDLAR, MD;  Location: [facility] OR;  Service: Open Heart Surgery;  Laterality: N/A;  RIGHT/LEFT HEART CATH AND CORONARY ANGIOGRAPHY N/A 03/05/2021   Procedure: RIGHT/LEFT HEART CATH AND CORONARY ANGIOGRAPHY;  Surgeon: Anner Alm ORN, MD;  Location: [facility] INVASIVE CV LAB;  Service: Cardiovascular;  Laterality: N/A;   TEE WITHOUT CARDIOVERSION N/A 03/05/2021   Procedure: TRANSESOPHAGEAL ECHOCARDIOGRAM (TEE);  Surgeon: Kate Lonni CROME, MD;  Location: [facility];  Service: Cardiovascular;  Laterality: N/A;   TEE WITHOUT CARDIOVERSION N/A 03/08/2021   Procedure: TRANSESOPHAGEAL ECHOCARDIOGRAM (TEE);  Surgeon: German Bartlett PEDLAR, MD;  Location: [facility] OR;  Service: Open Heart Surgery;  Laterality: N/A;   TEE WITHOUT CARDIOVERSION N/A 05/17/2021   Procedure: TRANSESOPHAGEAL ECHOCARDIOGRAM (TEE);  Surgeon: Okey Vina GAILS, MD;  Location: [facility];   Service: Cardiovascular;  Laterality: N/A;    Past Family History   Family History  Problem Relation Age of Onset   Heart disease Mother    Diabetes Mother    Cancer Mother    Heart disease Father    Diabetes Son 80       type 1   Diabetes Paternal Uncle    Heart attack Brother    Hypertension Son     Past Social History   Social History   Socioeconomic History   Marital status: Married    Spouse name: Arland   Number of children: 2   Years of education: 13   Highest education level: Some college, no degree  Occupational History   Occupation: Retired    Associate Professor: [employer]   Occupation: disabled  Tobacco Use   Smoking status: Some Days    Current packs/day: 0.00    Average packs/day: 0.3 packs/day for 20.0 years (5.0 ttl pk-yrs)    Types: Cigarettes    Start date: 02/09/1999    Last attempt to quit: 02/09/2019    Years since quitting: 5.3    Passive exposure: Never   Smokeless tobacco: Never  Vaping Use   Vaping status: Never Used  Substance and Sexual Activity   Alcohol  use: Not Currently    Comment: occasional   Drug use: No   Sexual activity: Not Currently  Other Topics Concern   Not on file  Social History Narrative   Patient has fibromyalgia and has significant pain with that. He has two adult children and is married. He has one grandchild.   Both sons live next door    Previously coached sports    Social Drivers of Health   Financial Resource Strain: Low Risk  (05/06/2024)   Overall Financial Resource Strain (CARDIA)    Difficulty of Paying Living Expenses: Not hard at all  Food Insecurity: No Food Insecurity (05/06/2024)   Hunger Vital Sign    Worried About Running Out of Food in the Last Year: Never true    Ran Out of Food in the Last Year: Never true  Transportation Needs: No Transportation Needs (05/06/2024)   PRAPARE - Administrator, Civil Service (Medical): No    Lack of Transportation (Non-Medical): No  Physical Activity:  Inactive (05/06/2024)   Exercise Vital Sign    Days of Exercise per Week: 0 days    Minutes of Exercise per Session: 0 min  Stress: No Stress Concern Present (05/06/2024)   [employer] of Occupational Health - Occupational Stress Questionnaire    Feeling of Stress: Not at all  Social Connections: Moderately Isolated (05/06/2024)   Social Connection and Isolation Panel    Frequency of Communication with Friends and Family: More than three times a week    Frequency  of Social Gatherings with Friends and Family: More than three times a week    Attends Religious Services: Never    Database administrator or Organizations: No    Attends Banker Meetings: Never    Marital Status: Married  Catering manager Violence: Not At Risk (05/06/2024)   Humiliation, Afraid, Rape, and Kick questionnaire    Fear of Current or Ex-Partner: No    Emotionally Abused: No    Physically Abused: No    Sexually Abused: No    Review of Systems   General: Negative for anorexia, weight loss, fever, chills, fatigue, weakness. Eyes: Negative for vision changes.  ENT: Negative for hoarseness, difficulty swallowing , nasal congestion. CV: Negative for chest pain, angina, palpitations, dyspnea on exertion, peripheral edema.  Respiratory: Negative for dyspnea at rest, dyspnea on exertion, cough, sputum, wheezing.  GI: See history of present illness. GU:  Negative for dysuria, hematuria, urinary incontinence, urinary frequency, nocturnal urination.  MS: Negative for joint pain, low back pain.  Derm: Negative for rash or itching.  Neuro: Negative for weakness, abnormal sensation, seizure, frequent headaches, memory loss,  confusion.  Psych: Negative for anxiety, depression, suicidal ideation, hallucinations.  Endo: Negative for unusual weight change.  Heme: Negative for bruising or bleeding. Allergy: Negative for rash or hives.  Physical Exam   There were no vitals taken for this visit.   General:  Well-nourished, well-developed in no acute distress.  Head: Normocephalic, atraumatic.   Eyes: Conjunctiva pink, no icterus. Mouth: Oropharyngeal mucosa moist and pink  Neck: Supple without thyromegaly, masses, or lymphadenopathy.  Lungs: Clear to auscultation bilaterally.  Heart: Regular rate and rhythm, no murmurs rubs or gallops.  Abdomen: Bowel sounds are normal, nontender, nondistended, no hepatosplenomegaly or masses,  no abdominal bruits or hernia, no rebound or guarding.   Rectal: not performed Extremities: No lower extremity edema. No clubbing or deformities.  Neuro: Alert and oriented x 4 , grossly normal neurologically.  Skin: Warm and dry, no rash or jaundice.   Psych: Alert and cooperative, normal mood and affect.  Labs   Lab Results  Component Value Date   TSH 2.340 01/14/2024   Lab Results  Component Value Date   NA 137 01/14/2024   CL 100 01/14/2024   K 4.3 01/14/2024   CO2 23 01/14/2024   BUN 12 01/14/2024   CREATININE 0.77 01/14/2024   EGFR 98 01/14/2024   CALCIUM  9.7 01/14/2024   PHOS 3.3 03/06/2021   ALBUMIN  4.6 01/14/2024   GLUCOSE 148 (H) 01/14/2024   Lab Results  Component Value Date   ALT 22 01/14/2024   AST 22 01/14/2024   ALKPHOS 58 01/14/2024   BILITOT 0.5 01/14/2024   Lab Results  Component Value Date   WBC 5.8 01/14/2024   HGB 14.5 01/14/2024   HCT 43.2 01/14/2024   MCV 93 01/14/2024   PLT 197 01/14/2024   Lab Results  Component Value Date   HGBA1C 7.2 (H) 01/14/2024    Imaging Studies   No results found.  Assessment/Plan:       PIERRETTE Sonny RAMAN. [place], MHS, PA-C [facility]

## 2024-05-30 ENCOUNTER — Ambulatory Visit (INDEPENDENT_AMBULATORY_CARE_PROVIDER_SITE_OTHER): Admitting: Gastroenterology

## 2024-05-30 ENCOUNTER — Telehealth: Payer: Self-pay | Admitting: *Deleted

## 2024-05-30 ENCOUNTER — Encounter: Payer: Self-pay | Admitting: Gastroenterology

## 2024-05-30 DIAGNOSIS — K5900 Constipation, unspecified: Secondary | ICD-10-CM

## 2024-05-30 DIAGNOSIS — I499 Cardiac arrhythmia, unspecified: Secondary | ICD-10-CM

## 2024-05-30 DIAGNOSIS — I10 Essential (primary) hypertension: Secondary | ICD-10-CM

## 2024-05-30 NOTE — Patient Instructions (Signed)
 Review of EKG done last month by Dr. Alvan shows that you were in Afib at that time. I have reached out to Dr. Alvan and am waiting on response from him.  We will request ok to proceed with colonoscopy and holding Eliquis  for 48 hours. If Dr. Alvan approves, we will call and schedule procedure.  Use miralax one capful (17 grams) every day mixed in six ounces of liquid. You can skip a day if your stools become too loose.  If you have increased fatigue, shortness of breath, chest pain, please go to the nearest ER.

## 2024-05-30 NOTE — Telephone Encounter (Signed)
  Request for patient to stop medication prior to procedure or is needing cleareance  05/30/24  Michael Valenzuela [birth year]  What type of surgery is being performed? COLONOSCOPY  When is surgery scheduled? TBD  What type of clearance is required (medical or pharmacy to hold medication or both? BOTH  Are there any medications that need to be held prior to surgery and how long? ELIQUIS  X 2 DAYS PRIOR  Name of physician performing surgery?  Dr. Shaaron Rouse [facility] at [facility] Phone: [phone] Fax: [phone]  Anethesia type (none, local, MAC, general)? MAC

## 2024-05-31 ENCOUNTER — Ambulatory Visit: Attending: Cardiology | Admitting: *Deleted

## 2024-05-31 ENCOUNTER — Encounter: Payer: Self-pay | Admitting: *Deleted

## 2024-05-31 ENCOUNTER — Telehealth: Payer: Self-pay | Admitting: *Deleted

## 2024-05-31 VITALS — BP 138/80 | HR 58 | Ht 71.5 in | Wt 205.2 lb

## 2024-05-31 DIAGNOSIS — I480 Paroxysmal atrial fibrillation: Secondary | ICD-10-CM | POA: Diagnosis present

## 2024-05-31 DIAGNOSIS — K0530 Chronic periodontitis, unspecified: Secondary | ICD-10-CM | POA: Diagnosis present

## 2024-05-31 NOTE — Telephone Encounter (Signed)
-----   Message from Nurse Reynaldo SQUIBB sent at 05/31/2024  4:08 PM EDT -----  ----- Message ----- From: Alvan Dorn FALCON, MD Sent: 05/31/2024   9:22 AM EDT To: Reynaldo MATSU Pinnix, LPN  Message from his GI provider about possible low heart rate during there visit. Can we set up a nursing visit for EKG and vitals please   JINNY Alvan MD

## 2024-05-31 NOTE — Patient Instructions (Signed)
Your physician recommends that you continue on your current medications as directed. Please refer to the Current Medication list given to you today. Your physician recommends that you schedule a follow-up appointment in: as planned 

## 2024-05-31 NOTE — Progress Notes (Signed)
 Presents for nurse visit per Dr. Alvan and message sent from GI. Medications reviewed. Reports taking all medications as prescribed without missing doses or side effects. Denies dizziness, chest pain, sob or palpitations. Reports having pain and fatigue all the time that is unchanged. EKG and vitals done and sent to provider for review.

## 2024-06-01 NOTE — Telephone Encounter (Signed)
   Primary Cardiologist: Alvan Carrier, MD  Chart reviewed as part of pre-operative protocol coverage. Given past medical history and time since last visit, based on ACC/AHA guidelines, Michael Valenzuela would be at acceptable risk for the planned procedure without further cardiovascular testing.   Patient should contact our office if he is having new symptoms that are concerning from a cardiac perspective to arrange a follow-up appointment.    Per office protocol, patient can hold Eliquis  for 2 days prior to procedure.    I will route this recommendation to the requesting party via Epic fax function and remove from pre-op pool.  Please call with questions.  Rosaline EMERSON Bane, NP-C 06/01/2024, 4:27 PM [address], [address] [place], [place] [address] Office [phone] Fax [phone]

## 2024-06-01 NOTE — Telephone Encounter (Signed)
 Patient with diagnosis of afib on Eliquis  for anticoagulation.    Procedure:  COLONOSCOPY  Date of procedure: TBD   CHA2DS2-VASc Score = 4   This indicates a 4.8% annual risk of stroke. The patient's score is based upon: CHF History: 1 HTN History: 1 Diabetes History: 1 Stroke History: 0 Vascular Disease History: 0 Age Score: 1 Gender Score: 0      CrCl 120 ml/min Platelet count 197  Patient has not had an Afib/aflutter ablation within the last 3 months or DCCV within the last 30 days  Per office protocol, patient can hold Eliquis  for 2 days prior to procedure.    **This guidance is not considered finalized until pre-operative APP has relayed final recommendations.**

## 2024-06-02 ENCOUNTER — Ambulatory Visit: Payer: Self-pay | Admitting: *Deleted

## 2024-06-02 NOTE — Telephone Encounter (Signed)
 The patient has been notified of the result and verbalized understanding.  All questions (if any) were answered. DOMINIC COLUMBUS, LPN 2/75/7974 1:69 AM

## 2024-06-02 NOTE — Telephone Encounter (Signed)
-----   Message from Jayson Sierras sent at 05/31/2024  4:45 PM EDT ----- Patient of Dr. Alvan.  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  Tracing from June showed most likely similar rhythm with variable AV conduction rather  than atrial fibrillation. ----- Message ----- From: Interface, Lab In [facility] Sent: 05/31/2024   4:26 PM EDT To: Jayson KANDICE Sierras, MD

## 2024-06-07 NOTE — Telephone Encounter (Signed)
 Ok to scheduled per orders previously provided

## 2024-06-08 ENCOUNTER — Telehealth: Payer: Self-pay | Admitting: Nurse Practitioner

## 2024-06-08 NOTE — Telephone Encounter (Signed)
 Attempted to call patient about a billing that he received from a visit on 01/14/24.   We have corrected the coding on this visit and sent it back to the insurance.

## 2024-06-08 NOTE — Telephone Encounter (Signed)
Called pt, line rang numerous times, no answer and no VM

## 2024-06-12 ENCOUNTER — Other Ambulatory Visit: Payer: Self-pay | Admitting: Nurse Practitioner

## 2024-06-12 DIAGNOSIS — G8929 Other chronic pain: Secondary | ICD-10-CM

## 2024-06-23 ENCOUNTER — Encounter: Payer: Self-pay | Admitting: *Deleted

## 2024-06-23 MED ORDER — PEG 3350-KCL-NA BICARB-NACL 420 G PO SOLR: 4000.0000 mL | Freq: Once | ORAL | 0 refills | Status: AC

## 2024-06-23 NOTE — Telephone Encounter (Signed)
 Spoke with pt. Scheduled for 9/17. Aware will call back with pre-op appt. Instructions to be mailed. Rx of prep sent. Aware medications to hold.

## 2024-06-23 NOTE — Addendum Note (Signed)
 Addended by: JEANELL GRAEME RAMAN on: 06/23/2024 09:39 AM   Modules accepted: Orders

## 2024-07-15 ENCOUNTER — Ambulatory Visit: Admitting: Nurse Practitioner

## 2024-07-15 ENCOUNTER — Encounter: Payer: Self-pay | Admitting: Nurse Practitioner

## 2024-07-15 VITALS — BP 138/76 | HR 58 | Temp 97.5°F | Ht 71.0 in | Wt 202.0 lb

## 2024-07-15 DIAGNOSIS — E119 Type 2 diabetes mellitus without complications: Secondary | ICD-10-CM

## 2024-07-15 DIAGNOSIS — I152 Hypertension secondary to endocrine disorders: Secondary | ICD-10-CM

## 2024-07-15 DIAGNOSIS — M5440 Lumbago with sciatica, unspecified side: Secondary | ICD-10-CM | POA: Diagnosis not present

## 2024-07-15 DIAGNOSIS — I5033 Acute on chronic diastolic (congestive) heart failure: Secondary | ICD-10-CM

## 2024-07-15 DIAGNOSIS — Z6830 Body mass index (BMI) 30.0-30.9, adult: Secondary | ICD-10-CM

## 2024-07-15 DIAGNOSIS — E1159 Type 2 diabetes mellitus with other circulatory complications: Secondary | ICD-10-CM

## 2024-07-15 DIAGNOSIS — I10 Essential (primary) hypertension: Secondary | ICD-10-CM

## 2024-07-15 DIAGNOSIS — E785 Hyperlipidemia, unspecified: Secondary | ICD-10-CM | POA: Diagnosis not present

## 2024-07-15 DIAGNOSIS — M797 Fibromyalgia: Secondary | ICD-10-CM

## 2024-07-15 DIAGNOSIS — E559 Vitamin D deficiency, unspecified: Secondary | ICD-10-CM

## 2024-07-15 DIAGNOSIS — G8929 Other chronic pain: Secondary | ICD-10-CM

## 2024-07-15 DIAGNOSIS — Z23 Encounter for immunization: Secondary | ICD-10-CM

## 2024-07-15 DIAGNOSIS — Z7984 Long term (current) use of oral hypoglycemic drugs: Secondary | ICD-10-CM

## 2024-07-15 DIAGNOSIS — I480 Paroxysmal atrial fibrillation: Secondary | ICD-10-CM

## 2024-07-15 LAB — LIPID PANEL

## 2024-07-15 LAB — BAYER DCA HB A1C WAIVED: HB A1C (BAYER DCA - WAIVED): 8.5 % — ABNORMAL HIGH (ref 4.8–5.6)

## 2024-07-15 MED ORDER — LOSARTAN POTASSIUM 100 MG PO TABS: ORAL_TABLET | ORAL | 1 refills | Status: DC

## 2024-07-15 MED ORDER — BLOOD GLUCOSE TEST VI STRP
1.0000 | ORAL_STRIP | Freq: Three times a day (TID) | 0 refills | Status: DC
Start: 2024-07-15 — End: 2024-08-01

## 2024-07-15 MED ORDER — SPIRONOLACTONE 25 MG PO TABS: ORAL_TABLET | ORAL | 1 refills | Status: DC

## 2024-07-15 MED ORDER — AMIODARONE HCL 200 MG PO TABS: 100.0000 mg | ORAL_TABLET | Freq: Every day | ORAL | 1 refills | Status: DC

## 2024-07-15 MED ORDER — AMLODIPINE BESYLATE 10 MG PO TABS: 10.0000 mg | ORAL_TABLET | Freq: Every day | ORAL | 1 refills | Status: DC

## 2024-07-15 MED ORDER — ROSUVASTATIN CALCIUM 10 MG PO TABS: 10.0000 mg | ORAL_TABLET | Freq: Every day | ORAL | 1 refills | Status: DC

## 2024-07-15 MED ORDER — LANCET DEVICE MISC: 1.0000 | Freq: Three times a day (TID) | 0 refills | Status: AC

## 2024-07-15 MED ORDER — BLOOD GLUCOSE MONITORING SUPPL DEVI
1.0000 | Freq: Three times a day (TID) | 0 refills | Status: DC
Start: 2024-07-15 — End: 2024-07-29

## 2024-07-15 MED ORDER — APIXABAN 5 MG PO TABS: ORAL_TABLET | ORAL | 1 refills | Status: DC

## 2024-07-15 MED ORDER — LANCETS MISC. MISC
1.0000 | Freq: Three times a day (TID) | 0 refills | Status: AC
Start: 2024-07-15 — End: 2024-08-14

## 2024-07-15 MED ORDER — SITAGLIPTIN PHOSPHATE 100 MG PO TABS: ORAL_TABLET | ORAL | 1 refills | Status: DC

## 2024-07-15 MED ORDER — METFORMIN HCL 1000 MG PO TABS: ORAL_TABLET | ORAL | 1 refills | Status: DC

## 2024-07-15 NOTE — Patient Instructions (Signed)

## 2024-07-15 NOTE — Addendum Note (Signed)
 Addended by: Maycen Degregory, MARY-MARGARET on: 07/15/2024 11:39 AM   Modules accepted: Orders

## 2024-07-15 NOTE — Progress Notes (Signed)
 Subjective:    Patient ID: Michael Valenzuela, male    DOB: 14-Aug-1956, 68 y.o.   MRN: [id number]   Chief Complaint: medical management of chronic issues     HPI:  Michael Valenzuela is a 68 y.o. who identifies as a male who was assigned male at birth.   Social history: Lives with: wife Work history: disability   Comes in today for follow up of the following chronic medical issues:  1. Essential hypertension, benign No c/o chest pain, sob or headache. Does not check blood pressure at home. BP Readings from Last 3 Encounters:  05/31/24 138/80  05/30/24 138/71  04/29/24 118/72     2. Hyperlipidemia with target LDL less than 100 Does not watch diet and does no exercise. Lab Results  Component Value Date   CHOL 125 01/14/2024   HDL 38 (L) 01/14/2024   LDLCALC 41 01/14/2024   TRIG 302 (H) 01/14/2024   CHOLHDL 3.3 01/14/2024  The ASCVD Risk score (Arnett DK, et al., 2019) failed to calculate for the following reasons:   The valid total cholesterol range is 130 to 320 mg/dL    3. Diabetes mellitus treated with oral medication (HCC) He does not check his blood sugars very often. Blood sugars were elevated for about a week, then they came back down. Running around 130-150 usually Lab Results  Component Value Date   HGBA1C 7.2 (H) 01/14/2024     4. Acute on chronic heart failure with preserved ejection fraction (HFpEF)/in the setting of severe mitral valve prolapse 5. Paroxysmal atrial fibrillation (HCC) Is on amiodrone and sees cardiology every 6 months. Last saw cardiology on  05/01/24- no changes made to plan of care  6. Chronic midline low back pain with sciatica, sciatica laterality unspecified 7. Fibromyalgia Sees pain management. Says they are really not helping with his pain but he continues to get meds monthly  8. Vitamin D deficiency Is on daily vitamin d supplement  9. BMI 30.0-30.9,adult No recent weight changes Wt Readings from Last 3 Encounters:  05/31/24 205 lb  3.2 oz (93.1 kg)  05/30/24 205 lb (93 kg)  05/06/24 198 lb (89.8 kg)   BMI Readings from Last 3 Encounters:  05/31/24 28.22 kg/m  05/30/24 28.19 kg/m  05/06/24 27.23 kg/m      New complaints: None today  No Known Allergies Outpatient Encounter Medications as of 07/15/2024  Medication Sig   acetaminophen  (TYLENOL ) 500 MG tablet Take 1,000 mg by mouth at bedtime.   amiodarone  (PACERONE ) 200 MG tablet Take 0.5 tablets (100 mg total) by mouth daily.   amLODipine  (NORVASC ) 10 MG tablet Take 1 tablet (10 mg total) by mouth daily.   apixaban  (ELIQUIS ) 5 MG TABS tablet TAKE 1 TABLET(5 MG) BY MOUTH TWICE DAILY   APPLE CIDER VINEGAR PO Take 450 mg by mouth daily.   Ascorbic Acid (VITAMIN C) 500 MG CAPS Take 500 mg by mouth daily.   bisacodyl  (DULCOLAX) 5 MG EC tablet Take 5 mg by mouth daily as needed for moderate constipation.   cyclobenzaprine  (FLEXERIL ) 10 MG tablet Take 1 tablet by mouth three times daily as needed for muscle spasm   famotidine  (PEPCID ) 20 MG tablet Take 20 mg by mouth daily. (Patient taking differently: Take 20 mg by mouth daily as needed.)   Ginkgo Biloba 125 MG CAPS Take 125 mg by mouth daily.   glucose blood (TRUETRACK TEST) test strip USE TO TEST BLOOD SUGAR ONCE DAILY   hydrALAZINE  (APRESOLINE ) 50 MG tablet Take  1 tablet (50 mg total) by mouth 2 (two) times daily.   hydrOXYzine  (ATARAX ) 10 MG tablet Take 10 mg by mouth at bedtime. (Patient taking differently: Take 10 mg by mouth daily as needed.)   losartan  (COZAAR ) 100 MG tablet TAKE 1 TABLET(100 MG) BY MOUTH DAILY   Melatonin 10 MG CAPS Take 10 mg by mouth daily as needed.   Menthol -Camphor (ICY HOT ADVANCED PAIN RELIEF) 16-11 % CREA Apply 1 application topically daily as needed (pain).   metFORMIN  (GLUCOPHAGE ) 1000 MG tablet TAKE 1 TABLET(1000 MG) BY MOUTH TWICE DAILY WITH A MEAL   Multiple Vitamins-Minerals (MULTIVITAMIN WITH MINERALS) tablet Take 1 tablet by mouth daily.   Oxycodone  HCl 20 MG TABS Take 1  tablet by mouth every 6 (six) hours.   polyethylene glycol powder (GLYCOLAX/MIRALAX) 17 GM/SCOOP powder Take 17 g by mouth daily as needed.   rosuvastatin  (CRESTOR ) 10 MG tablet Take 1 tablet (10 mg total) by mouth daily.   sennosides-docusate sodium  (SENOKOT-S) 8.6-50 MG tablet Take 1 tablet by mouth daily.   sitaGLIPtin  (JANUVIA ) 100 MG tablet TAKE 1 TABLET(100 MG) BY MOUTH DAILY   SODIUM BICARBONATE -CITRIC ACID PO Take by mouth.   spironolactone  (ALDACTONE ) 25 MG tablet Take 1 tablet by mouth once daily   Turmeric Curcumin 500 MG CAPS Take 500 mg by mouth daily.   No facility-administered encounter medications on file as of 07/15/2024.    Past Surgical History:  Procedure Laterality Date   CARDIOVERSION N/A 05/17/2021   Procedure: CARDIOVERSION;  Surgeon: Okey Vina GAILS, MD;  Location: [facility];  Service: Cardiovascular;  Laterality: N/A;   HERNIA REPAIR     MAZE N/A 03/08/2021   Procedure: MAZE;  Surgeon: German Bartlett PEDLAR, MD;  Location: [facility] OR;  Service: Open Heart Surgery;  Laterality: N/A;   MITRAL VALVE REPAIR N/A 03/08/2021   Procedure: MITRAL VALVE REPAIR USING  PHYSIO RING SIZE 28 (EXPLANTED) THEN MITRAL VALVE REPLACEMENT (MVR) WITH MAGNA EASE MITRAL VALVE SIZE  ON PUMP, LEFT ATRIAL APPENDAGE CLIPPING USING A SIZE 50 ATRIAL CLIP;  Surgeon: German Bartlett PEDLAR, MD;  Location: [facility] OR;  Service: Open Heart Surgery;  Laterality: N/A;   RIGHT/LEFT HEART CATH AND CORONARY ANGIOGRAPHY N/A 03/05/2021   Procedure: RIGHT/LEFT HEART CATH AND CORONARY ANGIOGRAPHY;  Surgeon: Anner Alm ORN, MD;  Location[facility] INVASIVE CV LAB;  Service: Cardiovascular;  Laterality: N/A;   TEE WITHOUT CARDIOVERSION N/A 03/05/2021   Procedure: TRANSESOPHAGEAL ECHOCARDIOGRAM (TEE);  Surgeon: Kate Lonni CROME, MD;  Location: [facility] ENDOSCOPY;  Service: Cardiovascular;  Laterality: N/A;   TEE WITHOUT CARDIOVERSION N/A 03/08/2021   Procedure: TRANSESOPHAGEAL ECHOCARDIOGRAM (TEE);  Surgeon: German Bartlett PEDLAR, MD;   Location: [facility] OR;  Service: Open Heart Surgery;  Laterality: N/A;   TEE WITHOUT CARDIOVERSION N/A 05/17/2021   Procedure: TRANSESOPHAGEAL ECHOCARDIOGRAM (TEE);  Surgeon: Okey Vina GAILS, MD;  Location: [facility];  Service: Cardiovascular;  Laterality: N/A;    Family History  Problem Relation Age of Onset   Heart disease Mother    Diabetes Mother    Cancer Mother    Heart disease Father    Heart attack Brother    Diabetes Son 55       type 1   Hypertension Son    Diabetes Paternal Uncle    Colon cancer Neg Hx       Controlled substance contract: n/a     Review of Systems  Constitutional:  Negative for diaphoresis.  Eyes:  Negative for pain.  Respiratory:  Negative  for shortness of breath.   Cardiovascular:  Negative for chest pain, palpitations and leg swelling.  Gastrointestinal:  Negative for abdominal pain.  Endocrine: Negative for polydipsia.  Skin:  Negative for rash.  Neurological:  Negative for dizziness, weakness and headaches.  Hematological:  Does not bruise/bleed easily.  All other systems reviewed and are negative.      Objective:   Physical Exam Vitals and nursing note reviewed.  Constitutional:      Appearance: Normal appearance. He is well-developed.  HENT:     Head: Normocephalic.     Nose: Nose normal.     Mouth/Throat:     Mouth: Mucous membranes are moist.     Pharynx: Oropharynx is clear.  Eyes:     Pupils: Pupils are equal, round, and reactive to light.  Neck:     Thyroid : No thyroid  mass or thyromegaly.     Vascular: No carotid bruit or JVD.     Trachea: Phonation normal.  Cardiovascular:     Rate and Rhythm: Normal rate and regular rhythm.  Pulmonary:     Effort: Pulmonary effort is normal. No respiratory distress.     Breath sounds: Normal breath sounds.  Abdominal:     General: Bowel sounds are normal.     Palpations: Abdomen is soft.     Tenderness: There is no abdominal tenderness.  Musculoskeletal:        General: Normal range of  motion.     Cervical back: Normal range of motion and neck supple.  Lymphadenopathy:     Cervical: No cervical adenopathy.  Skin:    General: Skin is warm and dry.  Neurological:     Mental Status: He is alert and oriented to person, place, and time.  Psychiatric:        Behavior: Behavior normal.        Thought Content: Thought content normal.        Judgment: Judgment normal.     BP 138/76   Pulse (!) 58   Temp (!) 97.5 F (36.4 C) (Temporal)   Ht 5' 11 (1.803 m)   Wt 202 lb (91.6 kg)   SpO2 97%   BMI 28.17 kg/m   Hgba1c 8.5%      Assessment & Plan:  Michael Valenzuela comes in today with chief complaint of medical management of chronic issues    Diagnosis and orders addressed:  1. Essential hypertension, benign Low sodium diet - CBC with Differential/Platelet - CMP14+EGFR - amLODipine  (NORVASC ) 10 MG tablet; Take 1 tablet (10 mg total) by mouth daily.  Dispense: 90 tablet; Refill: 1 - losartan  (COZAAR ) 100 MG tablet; TAKE 1 TABLET(100 MG) BY MOUTH DAILY  Dispense: 90 tablet; Refill: 1 - spironolactone  (ALDACTONE ) 25 MG tablet; Take 1 tablet by mouth once daily  Dispense: 90 tablet; Refill: 1  2. Hyperlipidemia with target LDL less than 100 Low fat diet - Lipid panel - rosuvastatin  (CRESTOR ) 10 MG tablet; Take 1 tablet (10 mg total) by mouth daily.  Dispense: 90 tablet; Refill: 1  3. Diabetes mellitus treated with oral medication (HCC) Back to watching carbs - Bayer DCA Hb A1c Waived - metFORMIN  (GLUCOPHAGE ) 1000 MG tablet; TAKE 1 TABLET(1000 MG) BY MOUTH TWICE DAILY WITH A MEAL  Dispense: 180 tablet; Refill: 1 - sitaGLIPtin  (JANUVIA ) 100 MG tablet; TAKE 1 TABLET(100 MG) BY MOUTH DAILY  Dispense: 90 tablet; Refill: 1  4. Acute on chronic heart failure with preserved ejection fraction (HFpEF)/in the setting of severe mitral valve prolapse  5. Paroxysmal atrial fibrillation (HCC) Keep follow up appointment with cardiology - amiodarone  (PACERONE ) 200 MG tablet; Take  0.5 tablets (100 mg total) by mouth daily.  Dispense: 45 tablet; Refill: 1 - apixaban  (ELIQUIS ) 5 MG TABS tablet; TAKE 1 TABLET(5 MG) BY MOUTH TWICE DAILY  Dispense: 180 tablet; Refill: 1  6. Chronic midline low back pain with sciatica, sciatica laterality unspecified 7. Fibromyalgia [facility]  8. Vitamin D deficiency Continue vitamin d supplement   9. BMI 30.0-30.9,adult Discussed diet and exercise for person with BMI >25 Will recheck weight in 3-6 months    Labs pending Health Maintenance reviewed Diet and exercise encouraged  Follow up plan: 6 months   Mary-Margaret Gladis, FNP

## 2024-07-16 LAB — MICROALBUMIN / CREATININE URINE RATIO
Creatinine, Urine: 96.4 mg/dL
Microalb/Creat Ratio: 30 mg/g{creat} — ABNORMAL HIGH (ref 0–29)
Microalbumin, Urine: 29.3 ug/mL

## 2024-07-16 LAB — CBC WITH DIFFERENTIAL/PLATELET
Basophils Absolute: 0.1 x10E3/uL (ref 0.0–0.2)
Basos: 1 %
EOS (ABSOLUTE): 0.2 x10E3/uL (ref 0.0–0.4)
Eos: 3 %
Hematocrit: 44.3 % (ref 37.5–51.0)
Hemoglobin: 14.8 g/dL (ref 13.0–17.7)
Immature Grans (Abs): 0.1 x10E3/uL (ref 0.0–0.1)
Immature Granulocytes: 1 %
Lymphocytes Absolute: 1.3 x10E3/uL (ref 0.7–3.1)
Lymphs: 21 %
MCH: 31.3 pg (ref 26.6–33.0)
MCHC: 33.4 g/dL (ref 31.5–35.7)
MCV: 94 fL (ref 79–97)
Monocytes Absolute: 0.6 x10E3/uL (ref 0.1–0.9)
Monocytes: 10 %
Neutrophils Absolute: 4.2 x10E3/uL (ref 1.4–7.0)
Neutrophils: 64 %
Platelets: 201 x10E3/uL (ref 150–450)
RBC: 4.73 x10E6/uL (ref 4.14–5.80)
RDW: 13.1 % (ref 11.6–15.4)
WBC: 6.4 x10E3/uL (ref 3.4–10.8)

## 2024-07-16 LAB — CMP14+EGFR
ALT: 28 IU/L (ref 0–44)
AST: 24 IU/L (ref 0–40)
Albumin: 4.7 g/dL (ref 3.9–4.9)
Alkaline Phosphatase: 59 IU/L (ref 44–121)
BUN/Creatinine Ratio: 14 (ref 10–24)
BUN: 12 mg/dL (ref 8–27)
Bilirubin Total: 0.4 mg/dL (ref 0.0–1.2)
CO2: 22 mmol/L (ref 20–29)
Calcium: 10.1 mg/dL (ref 8.6–10.2)
Chloride: 98 mmol/L (ref 96–106)
Creatinine, Ser: 0.83 mg/dL (ref 0.76–1.27)
Globulin, Total: 2.6 g/dL (ref 1.5–4.5)
Glucose: 190 mg/dL — ABNORMAL HIGH (ref 70–99)
Potassium: 4.2 mmol/L (ref 3.5–5.2)
Sodium: 138 mmol/L (ref 134–144)
Total Protein: 7.3 g/dL (ref 6.0–8.5)
eGFR: 95 mL/min/1.73 (ref >59)

## 2024-07-16 LAB — LIPID PANEL
Chol/HDL Ratio: 2.9 ratio (ref 0.0–5.0)
Cholesterol, Total: 126 mg/dL (ref 100–199)
HDL: 44 mg/dL (ref >39)
LDL Chol Calc (NIH): 43 mg/dL (ref 0–99)
Triglycerides: 250 mg/dL — ABNORMAL HIGH (ref 0–149)
VLDL Cholesterol Cal: 39 mg/dL (ref 5–40)

## 2024-07-16 LAB — VITAMIN B12: Vitamin B-12: 391 pg/mL (ref 232–1245)

## 2024-07-18 ENCOUNTER — Ambulatory Visit: Payer: Self-pay | Admitting: Nurse Practitioner

## 2024-07-22 ENCOUNTER — Other Ambulatory Visit: Payer: Self-pay

## 2024-07-22 ENCOUNTER — Encounter (HOSPITAL_COMMUNITY): Payer: Self-pay

## 2024-07-25 ENCOUNTER — Encounter (HOSPITAL_COMMUNITY)
Admission: RE | Admit: 2024-07-25 | Discharge: 2024-07-25 | Disposition: A | Discharge status: Home / Self Care | Source: Ambulatory Visit | Attending: Internal Medicine | Admitting: Internal Medicine

## 2024-07-27 ENCOUNTER — Ambulatory Visit (HOSPITAL_COMMUNITY): Admitting: Anesthesiology

## 2024-07-27 ENCOUNTER — Encounter (HOSPITAL_COMMUNITY): Payer: Self-pay | Admitting: Internal Medicine

## 2024-07-27 ENCOUNTER — Encounter (HOSPITAL_COMMUNITY)
Admission: RE | Disposition: A | Discharge status: Home / Self Care | Payer: Self-pay | Source: Home / Self Care | Attending: Internal Medicine

## 2024-07-27 ENCOUNTER — Ambulatory Visit (HOSPITAL_COMMUNITY)
Admission: RE | Admit: 2024-07-27 | Discharge: 2024-07-27 | Disposition: A | Discharge status: Home / Self Care | Attending: Internal Medicine | Admitting: Internal Medicine

## 2024-07-27 DIAGNOSIS — Z1211 Encounter for screening for malignant neoplasm of colon: Secondary | ICD-10-CM

## 2024-07-27 DIAGNOSIS — I5023 Acute on chronic systolic (congestive) heart failure: Secondary | ICD-10-CM | POA: Diagnosis not present

## 2024-07-27 DIAGNOSIS — D122 Benign neoplasm of ascending colon: Secondary | ICD-10-CM | POA: Diagnosis not present

## 2024-07-27 DIAGNOSIS — Z7901 Long term (current) use of anticoagulants: Secondary | ICD-10-CM | POA: Insufficient documentation

## 2024-07-27 DIAGNOSIS — I4891 Unspecified atrial fibrillation: Secondary | ICD-10-CM | POA: Insufficient documentation

## 2024-07-27 DIAGNOSIS — I110 Hypertensive heart disease with heart failure: Secondary | ICD-10-CM

## 2024-07-27 DIAGNOSIS — K643 Fourth degree hemorrhoids: Secondary | ICD-10-CM | POA: Diagnosis not present

## 2024-07-27 DIAGNOSIS — K623 Rectal prolapse: Secondary | ICD-10-CM | POA: Diagnosis not present

## 2024-07-27 DIAGNOSIS — F17210 Nicotine dependence, cigarettes, uncomplicated: Secondary | ICD-10-CM | POA: Insufficient documentation

## 2024-07-27 DIAGNOSIS — I509 Heart failure, unspecified: Secondary | ICD-10-CM | POA: Insufficient documentation

## 2024-07-27 DIAGNOSIS — E119 Type 2 diabetes mellitus without complications: Secondary | ICD-10-CM | POA: Diagnosis not present

## 2024-07-27 HISTORY — PX: COLONOSCOPY: SHX5424

## 2024-07-27 LAB — GLUCOSE, CAPILLARY: Glucose-Capillary: 167 mg/dL — ABNORMAL HIGH (ref 70–99)

## 2024-07-27 SURGERY — COLONOSCOPY
Anesthesia: General

## 2024-07-27 MED ORDER — LACTATED RINGERS IV SOLN: INTRAVENOUS | Status: DC | PRN

## 2024-07-27 MED ORDER — LIDOCAINE 2% (20 MG/ML) 5 ML SYRINGE
INTRAMUSCULAR | Status: DC | PRN
  Administered 2024-07-27: 50 mg via INTRAVENOUS

## 2024-07-27 MED ORDER — PROPOFOL 10 MG/ML IV BOLUS
INTRAVENOUS | Status: DC | PRN
Start: 2024-07-27 — End: 2024-07-27
  Administered 2024-07-27: 100 mg via INTRAVENOUS

## 2024-07-27 MED ORDER — PROPOFOL 500 MG/50ML IV EMUL
INTRAVENOUS | Status: DC | PRN
  Administered 2024-07-27: 150 ug/kg/min via INTRAVENOUS

## 2024-07-27 NOTE — Anesthesia Preprocedure Evaluation (Signed)
 Anesthesia Evaluation  Patient identified by MRN, date of birth, ID band Patient awake    Reviewed: Allergy & Precautions, H&P , NPO status , Patient's Chart, lab work & pertinent test results, reviewed documented beta blocker date and time   Airway Mallampati: II  TM Distance: >3 FB Neck ROM: full    Dental no notable dental hx.    Pulmonary neg pulmonary ROS, Current Smoker   Pulmonary exam normal breath sounds clear to auscultation       Cardiovascular Exercise Tolerance: Good hypertension, +CHF  + dysrhythmias Atrial Fibrillation  Rhythm:regular Rate:Normal     Neuro/Psych  Neuromuscular disease  negative psych ROS   GI/Hepatic negative GI ROS, Neg liver ROS,,,  Endo/Other  diabetes    Renal/GU negative Renal ROS  negative genitourinary   Musculoskeletal   Abdominal   Peds  Hematology negative hematology ROS (+)   Anesthesia Other Findings   Reproductive/Obstetrics negative OB ROS                              Anesthesia Physical Anesthesia Plan  ASA: 3  Anesthesia Plan: General   Post-op Pain Management:    Induction:   PONV Risk Score and Plan: Propofol  infusion  Airway Management Planned:   Additional Equipment:   Intra-op Plan:   Post-operative Plan:   Informed Consent: I have reviewed the patients History and Physical, chart, labs and discussed the procedure including the risks, benefits and alternatives for the proposed anesthesia with the patient or authorized representative who has indicated his/her understanding and acceptance.     Dental Advisory Given  Plan Discussed with: CRNA  Anesthesia Plan Comments:         Anesthesia Quick Evaluation

## 2024-07-27 NOTE — Anesthesia Postprocedure Evaluation (Signed)
 Anesthesia Post Note  Patient: Michael Valenzuela  Procedure(s) Performed: COLONOSCOPY  Patient location during evaluation: Short Stay Anesthesia Type: General Level of consciousness: awake and alert Pain management: pain level controlled Vital Signs Assessment: post-procedure vital signs reviewed and stable Respiratory status: spontaneous breathing Cardiovascular status: blood pressure returned to baseline and stable Postop Assessment: no apparent nausea or vomiting Anesthetic complications: no Comments: Late entry   No notable events documented.   Last Vitals:  Vitals:   07/27/24 0704 07/27/24 0801  BP: (!) 146/81 119/66  Pulse:  (!) 53  Resp: 18 14  Temp: 36.9 C 36.7 C  SpO2: 96%     Last Pain:  Vitals:   07/27/24 0801  TempSrc: Oral  PainSc: 0-No pain                 Deanndra Kirley

## 2024-07-27 NOTE — Transfer of Care (Signed)
 Immediate Anesthesia Transfer of Care Note  Patient: Michael Valenzuela  Procedure(s) Performed: COLONOSCOPY  Patient Location: Short Stay  Anesthesia Type:General  Level of Consciousness: awake  Airway & Oxygen Therapy: Patient Spontanous Breathing  Post-op Assessment: Report given to RN  Post vital signs: Reviewed and stable  Last Vitals:  Vitals Value Taken Time  BP    Temp    Pulse    Resp    SpO2      Last Pain:  Vitals:   07/27/24 0727  TempSrc:   PainSc: 0-No pain      Patients Stated Pain Goal: 8 (07/27/24 0704)  Complications: No notable events documented.

## 2024-07-27 NOTE — Op Note (Signed)
 [facility] Patient Name: Michael Valenzuela Procedure Date: 07/27/2024 7:14 AM MRN: [id number] Date of Birth: 11-04-1956 Attending MD: Lamar Ozell Hollingshead , MD, [id number] CSN: [id number] Age: 68 Admit Type: Outpatient Procedure:                Colonoscopy Indications:              Screening for colorectal malignant neoplasm Providers:                Lamar Ozell Hollingshead, MD, Madelin Hunter, RN, Dorcas Lenis, Technician Referring MD:              Medicines:                Propofol  per Anesthesia Complications:            No immediate complications. Estimated Blood Loss:     Estimated blood loss was minimal. Procedure:                Pre-Anesthesia Assessment:                           - Prior to the procedure, a History and Physical                            was performed, and patient medications and                            allergies were reviewed. The patient's tolerance of                            previous anesthesia was also reviewed. The risks                            and benefits of the procedure and the sedation                            options and risks were discussed with the patient.                            All questions were answered, and informed consent                            was obtained. Prior Anticoagulants: The patient                            last took Eliquis  (apixaban ) 2 days prior to the                            procedure. ASA Grade Assessment: III - A patient                            with severe systemic disease. After reviewing the  risks and benefits, the patient was deemed in                            satisfactory condition to undergo the procedure.                           After obtaining informed consent, the colonoscope                            was passed under direct vision. Throughout the                            procedure, the patient's blood pressure, pulse, and                             oxygen saturations were monitored continuously. The                            [id number] ([id number]) Colon was introduced through                            the anus and advanced to the the cecum, identified                            by appendiceal orifice and ileocecal valve. The                            ileocecal valve, appendiceal orifice, and rectum                            were photographed. Scope In: 7:33:40 AM Scope Out: 7:53:01 AM Scope Withdrawal Time: 0 hours 10 minutes 27 seconds  Total Procedure Duration: 0 hours 19 minutes 21 seconds  Findings:      Perianal exam revealed a small amount of rectal prolapse and grade 4       hemorrhoids.      Two pedunculated polyps were found in the mid ascending colon. The       polyps were 8 to 9 mm in size. These polyps were removed with a hot       snare. Resection and retrieval were complete. Estimated blood loss was       minimal.      The exam was otherwise without abnormality on direct and retroflexion       views. Impression:               - Two 8 to 9 mm polyps in the mid ascending colon,                            removed with a hot snare. Resected and retrieved.                           - The examination was otherwise normal on direct                            and retroflexion  views. Moderate Sedation:      Moderate (conscious) sedation was personally administered by an       anesthesia professional. The following parameters were monitored: oxygen       saturation, heart rate, blood pressure, respiratory rate, EKG, adequacy       of pulmonary ventilation, and response to care. Recommendation:           - Patient has a contact number available for                            emergencies. The signs and symptoms of potential                            delayed complications were discussed with the                            patient. Return to normal activities tomorrow.                            Written discharge  instructions were provided to the                            patient.                           - Advance diet as tolerated.                           - Repeat colonoscopy date to be determined after                            pending pathology results are reviewed for                            surveillance.                           - Return to GI office (date not yet determined). Procedure Code(s):        --- Professional ---                           [phone], Colonoscopy, flexible; with removal of                            tumor(s), polyp(s), or other lesion(s) by snare                            technique Diagnosis Code(s):        --- Professional ---                           Z12.11, Encounter for screening for malignant                            neoplasm of colon  D12.2, Benign neoplasm of ascending colon CPT copyright 2022 American [employer] Association. All rights reserved. The codes documented in this report are preliminary and upon coder review may  be revised to meet current compliance requirements. Lamar HERO. Chester Sibert, MD Lamar Ozell Hollingshead, MD 07/27/2024 8:02:58 AM This report has been signed electronically. Number of Addenda: 0

## 2024-07-27 NOTE — Discharge Instructions (Addendum)
  Colonoscopy Discharge Instructions  Read the instructions outlined below and refer to this sheet in the next few weeks. These discharge instructions provide you with general information on caring for yourself after you leave the hospital. Your doctor may also give you specific instructions. While your treatment has been planned according to the most current medical practices available, unavoidable complications occasionally occur. If you have any problems or questions after discharge, call Dr. Shaaron at [phone]. ACTIVITY You may resume your regular activity, but move at a slower pace for the next 24 hours.  Take frequent rest periods for the next 24 hours.  Walking will help get rid of the air and reduce the bloated feeling in your belly (abdomen).  No driving for 24 hours (because of the medicine (anesthesia) used during the test).   Do not sign any important legal documents or operate any machinery for 24 hours (because of the anesthesia used during the test).  NUTRITION Drink plenty of fluids.  You may resume your normal diet as instructed by your doctor.  Begin with a light meal and progress to your normal diet. Heavy or fried foods are harder to digest and may make you feel sick to your stomach (nauseated).  Avoid alcoholic beverages for 24 hours or as instructed.  MEDICATIONS You may resume your normal medications unless your doctor tells you otherwise.  WHAT YOU CAN EXPECT TODAY Some feelings of bloating in the abdomen.  Passage of more gas than usual.  Spotting of blood in your stool or on the toilet paper.  IF YOU HAD POLYPS REMOVED DURING THE COLONOSCOPY: No aspirin  products for 7 days or as instructed.  No alcohol  for 7 days or as instructed.  Eat a soft diet for the next 24 hours.  FINDING OUT THE RESULTS OF YOUR TEST Not all test results are available during your visit. If your test results are not back during the visit, make an appointment with your caregiver to find out the  results. Do not assume everything is normal if you have not heard from your caregiver or the medical facility. It is important for you to follow up on all of your test results.  SEEK IMMEDIATE MEDICAL ATTENTION IF: You have more than a spotting of blood in your stool.  Your belly is swollen (abdominal distention).  You are nauseated or vomiting.  You have a temperature over 101.  You have abdominal pain or discomfort that is severe or gets worse throughout the day.     2 polyps found and removed.  You have significant hemorrhoids and partial rectal prolapse.  Avoid constipation.  Wait until 07/31/2024 to resume Eliquis   Further recommendations to follow pending review of pathology report

## 2024-07-27 NOTE — H&P (Signed)
 @LOGO @   Primary Care Physician:  Gladis Mustard, FNP Primary Gastroenterologist:  Dr. Shaaron  Pre-Procedure History & Physical: HPI:  Michael Valenzuela is a 68 y.o. male is here for a screening colonoscopy.  Reported negative colonoscopy elsewhere over 10 years ago.  No bowel symptoms at this time.  On Eliquis  has been held x 2 days.  Past Medical History:  Diagnosis Date   A-fib [facility])    Afib/Aflutter 2022   Arthritis    Chronic low back pain    Diabetes mellitus without complication (HCC)    Fibromyalgia    Hyperlipidemia    Hypertension    Mitral regurgitation    severe s/p bioprosthetic mitral valve repair in 2022   Vitamin D deficiency     Past Surgical History:  Procedure Laterality Date   CARDIOVERSION N/A 05/17/2021   Procedure: CARDIOVERSION;  Surgeon: Okey Vina GAILS, MD;  Location: [facility];  Service: Cardiovascular;  Laterality: N/A;   HERNIA REPAIR     MAZE N/A 03/08/2021   Procedure: MAZE;  Surgeon: German Bartlett PEDLAR, MD;  Location: [facility] OR;  Service: Open Heart Surgery;  Laterality: N/A;   MITRAL VALVE REPAIR N/A 03/08/2021   Procedure: MITRAL VALVE REPAIR USING  PHYSIO RING SIZE 28 (EXPLANTED) THEN MITRAL VALVE REPLACEMENT (MVR) WITH MAGNA EASE MITRAL VALVE SIZE  ON PUMP, LEFT ATRIAL APPENDAGE CLIPPING USING A SIZE 50 ATRIAL CLIP;  Surgeon: German Bartlett PEDLAR, MD;  Location: [facility] OR;  Service: Open Heart Surgery;  Laterality: N/A;   RIGHT/LEFT HEART CATH AND CORONARY ANGIOGRAPHY N/A 03/05/2021   Procedure: RIGHT/LEFT HEART CATH AND CORONARY ANGIOGRAPHY;  Surgeon: Anner Alm ORN, MD;  Location: [facility] INVASIVE CV LAB;  Service: Cardiovascular;  Laterality: N/A;   TEE WITHOUT CARDIOVERSION N/A 03/05/2021   Procedure: TRANSESOPHAGEAL ECHOCARDIOGRAM (TEE);  Surgeon: Kate Lonni CROME, MD;  Location: [facility] ENDOSCOPY;  Service: Cardiovascular;  Laterality: N/A;   TEE WITHOUT CARDIOVERSION N/A 03/08/2021   Procedure: TRANSESOPHAGEAL ECHOCARDIOGRAM (TEE);  Surgeon: German Bartlett PEDLAR, MD;  Location: [facility] OR;  Service: Open Heart Surgery;  Laterality: N/A;   TEE WITHOUT CARDIOVERSION N/A 05/17/2021   Procedure: TRANSESOPHAGEAL ECHOCARDIOGRAM (TEE);  Surgeon: Okey Vina GAILS, MD;  Location: [facility];  Service: Cardiovascular;  Laterality: N/A;    Prior to Admission medications   Medication Sig Start Date End Date Taking? Authorizing Provider  acetaminophen  (TYLENOL ) 500 MG tablet Take 1,000 mg by mouth at bedtime.   Yes [provider]  amiodarone  (PACERONE ) 200 MG tablet Take 0.5 tablets (100 mg total) by mouth daily. 07/15/24  Yes Martin, Mary-Margaret, FNP  amLODipine  (NORVASC ) 10 MG tablet Take 1 tablet (10 mg total) by mouth daily. 07/15/24 01/11/25 Yes Martin, Mary-Margaret, FNP  APPLE CIDER VINEGAR PO Take 450 mg by mouth daily.   Yes [provider]  Ascorbic Acid (VITAMIN C) 500 MG CAPS Take 500 mg by mouth daily.   Yes [provider]  bisacodyl  (DULCOLAX) 5 MG EC tablet Take 5 mg by mouth daily as needed for moderate constipation.   Yes [provider]  Blood Glucose Monitoring Suppl DEVI 1 each by Does not apply route in the morning, at noon, and at bedtime. May substitute to any manufacturer covered by patient's insurance. 07/15/24  Yes Gladis, Mary-Margaret, FNP  cyclobenzaprine  (FLEXERIL ) 10 MG tablet Take 1 tablet by mouth three times daily as needed for muscle spasm 06/13/24  Yes Gladis, Mary-Margaret, FNP  famotidine  (PEPCID ) 20 MG tablet Take 20 mg by mouth daily. Patient  taking differently: Take 20 mg by mouth daily as needed.   Yes [provider]  Ginkgo Biloba 125 MG CAPS Take 125 mg by mouth daily.   Yes [provider]  Glucose Blood (BLOOD GLUCOSE TEST STRIPS) STRP 1 each by In Vitro route in the morning, at noon, and at bedtime. May substitute to any manufacturer covered by patient's insurance. 07/15/24 08/14/24 Yes Martin, Mary-Margaret, FNP  glucose blood (TRUETRACK TEST) test strip USE TO TEST BLOOD SUGAR  ONCE DAILY 02/05/23  Yes Gladis, Mary-Margaret, FNP  hydrALAZINE  (APRESOLINE ) 50 MG tablet Take 1 tablet (50 mg total) by mouth 2 (two) times daily. 01/14/24  Yes Martin, Mary-Margaret, FNP  hydrOXYzine  (ATARAX ) 10 MG tablet Take 10 mg by mouth at bedtime. Patient taking differently: Take 10 mg by mouth daily as needed. 02/29/24  Yes [provider]  Lancet Device MISC 1 each by Does not apply route in the morning, at noon, and at bedtime. May substitute to any manufacturer covered by patient's insurance. 07/15/24 08/14/24 Yes Gladis Mary-Margaret, FNP  Lancets Misc. MISC 1 each by Does not apply route in the morning, at noon, and at bedtime. May substitute to any manufacturer covered by patient's insurance. 07/15/24 08/14/24 Yes Martin, Mary-Margaret, FNP  losartan  (COZAAR ) 100 MG tablet TAKE 1 TABLET(100 MG) BY MOUTH DAILY 07/15/24  Yes Gladis, Mary-Margaret, FNP  Melatonin 10 MG CAPS Take 10 mg by mouth daily as needed.   Yes [provider]  Menthol -Camphor (ICY HOT ADVANCED PAIN RELIEF) 16-11 % CREA Apply 1 application topically daily as needed (pain).   Yes [provider]  Oxycodone  HCl 20 MG TABS Take 1 tablet by mouth every 6 (six) hours. 10/14/22  Yes [provider]  polyethylene glycol powder (GLYCOLAX/MIRALAX) 17 GM/SCOOP powder Take 17 g by mouth daily as needed.   Yes [provider]  rosuvastatin  (CRESTOR ) 10 MG tablet Take 1 tablet (10 mg total) by mouth daily. 07/15/24  Yes Martin, Mary-Margaret, FNP  sennosides-docusate sodium  (SENOKOT-S) 8.6-50 MG tablet Take 1 tablet by mouth daily.   Yes [provider]  sitaGLIPtin  (JANUVIA ) 100 MG tablet TAKE 1 TABLET(100 MG) BY MOUTH DAILY 07/15/24  Yes Gladis, Mary-Margaret, FNP  SODIUM BICARBONATE -CITRIC ACID PO Take by mouth.   Yes [provider]  spironolactone  (ALDACTONE ) 25 MG tablet Take 1 tablet by mouth once daily 07/15/24  Yes Gladis, Mary-Margaret, FNP  Turmeric Curcumin 500 MG CAPS  Take 500 mg by mouth daily.   Yes [provider]  apixaban  (ELIQUIS ) 5 MG TABS tablet TAKE 1 TABLET(5 MG) BY MOUTH TWICE DAILY 07/15/24   Gladis, Mary-Margaret, FNP  metFORMIN  (GLUCOPHAGE ) 1000 MG tablet TAKE 1 TABLET(1000 MG) BY MOUTH TWICE DAILY WITH A MEAL 07/15/24   Gladis, Mary-Margaret, FNP  Multiple Vitamins-Minerals (MULTIVITAMIN WITH MINERALS) tablet Take 1 tablet by mouth daily.    [provider]    Allergies as of 06/23/2024   (No Known Allergies)    Family History  Problem Relation Age of Onset   Heart disease Mother    Diabetes Mother    Cancer Mother    Heart disease Father    Heart attack Brother    Diabetes Son 70       type 1   Hypertension Son    Diabetes Paternal Uncle    Colon cancer Neg Hx     Social History   Socioeconomic History   Marital status: Married    Spouse name: Arland   Number of children:  2   Years of education: 92   Highest education level: Some college, no degree  Occupational History   Occupation: Retired    Associate Professor: [employer]   Occupation: disabled  Tobacco Use   Smoking status: Every Day    Current packs/day: 0.00    Average packs/day: 0.3 packs/day for 20.0 years (5.0 ttl pk-yrs)    Types: Cigarettes    Start date: 02/09/1999    Last attempt to quit: 02/09/2019    Years since quitting: 5.4    Passive exposure: Never   Smokeless tobacco: Never  Vaping Use   Vaping status: Never Used  Substance and Sexual Activity   Alcohol  use: Not Currently    Comment: occasional   Drug use: No   Sexual activity: Not Currently  Other Topics Concern   Not on file  Social History Narrative   Patient has fibromyalgia and has significant pain with that. He has two adult children and is married. He has one grandchild.   Both sons live next door    Previously coached sports    Social Drivers of Health   Financial Resource Strain: Low Risk  (05/06/2024)   Overall Financial Resource Strain (CARDIA)    Difficulty of Paying  Living Expenses: Not hard at all  Food Insecurity: No Food Insecurity (05/06/2024)   Hunger Vital Sign    Worried About Running Out of Food in the Last Year: Never true    Ran Out of Food in the Last Year: Never true  Transportation Needs: No Transportation Needs (05/06/2024)   PRAPARE - Administrator, Civil Service (Medical): No    Lack of Transportation (Non-Medical): No  Physical Activity: Inactive (05/06/2024)   Exercise Vital Sign    Days of Exercise per Week: 0 days    Minutes of Exercise per Session: 0 min  Stress: No Stress Concern Present (05/06/2024)   [employer] of Occupational Health - Occupational Stress Questionnaire    Feeling of Stress: Not at all  Social Connections: Moderately Isolated (05/06/2024)   Social Connection and Isolation Panel    Frequency of Communication with Friends and Family: More than three times a week    Frequency of Social Gatherings with Friends and Family: More than three times a week    Attends Religious Services: Never    Database administrator or Organizations: No    Attends Banker Meetings: Never    Marital Status: Married  Catering manager Violence: Not At Risk (05/06/2024)   Humiliation, Afraid, Rape, and Kick questionnaire    Fear of Current or Ex-Partner: No    Emotionally Abused: No    Physically Abused: No    Sexually Abused: No    Review of Systems: See HPI, otherwise negative ROS  Physical Exam: BP (!) 146/81   Temp 98.4 F (36.9 C) (Oral)   Resp 18   Ht 5' 11 (1.803 m)   Wt 87.5 kg   SpO2 96%   BMI 26.92 kg/m  General:   Alert,  Well-developed, well-nourished, pleasant and cooperative in NAD Lungs:  Clear throughout to auscultation.   No wheezes, crackles, or rhonchi. No acute distress. Heart:  Regular rate and rhythm; no murmurs, clicks, rubs,  or gallops. Abdomen:  Soft, nontender and nondistended. No masses, hepatosplenomegaly or hernias noted. Normal bowel sounds, without guarding, and  without rebound.    Impression/Plan: Michael Valenzuela is now here to undergo a screening colonoscopy.  Average risk screening examination.  Eliquis  is  held x 2 days.  Risks, benefits, limitations, imponderables and alternatives regarding colonoscopy have been reviewed with the patient. Questions have been answered. All parties agreeable.     Notice:  This dictation was prepared with Dragon dictation along with smaller phrase technology. Any transcriptional errors that result from this process are unintentional and may not be corrected upon review.

## 2024-07-28 ENCOUNTER — Encounter (HOSPITAL_COMMUNITY): Payer: Self-pay | Admitting: Internal Medicine

## 2024-07-28 ENCOUNTER — Ambulatory Visit: Payer: Self-pay | Admitting: Internal Medicine

## 2024-07-28 LAB — SURGICAL PATHOLOGY

## 2024-07-29 ENCOUNTER — Other Ambulatory Visit: Payer: Self-pay

## 2024-07-29 ENCOUNTER — Telehealth: Payer: Self-pay | Admitting: Family Medicine

## 2024-07-29 MED ORDER — BLOOD GLUCOSE MONITORING SUPPL DEVI: 1.0000 | Freq: Three times a day (TID) | 0 refills | Status: DC

## 2024-07-29 NOTE — Telephone Encounter (Signed)
 Rx resent to [facility] with diagnosis code

## 2024-07-29 NOTE — Telephone Encounter (Signed)
 Copied from CRM [id number]. Topic: Clinical - Prescription Issue >> Jul 29, 2024  4:27 PM Wess RAMAN wrote: Reason for CRM: Ileana from [facility] stated patient's insurance will not cover Glucometer, test strips, and lancets without diagnosis code. They need to prescription rewritten with diagnosis codes.  Pharmacy: [facility] [address] [place] - [address] [phone] Michael Valenzuela [place] [address] Phone: [phone] Fax: [phone] Hours: Not open 24 hours

## 2024-08-01 MED ORDER — ACCU-CHEK GUIDE TEST VI STRP: ORAL_STRIP | 3 refills | Status: DC

## 2024-08-01 NOTE — Telephone Encounter (Signed)
 Fax from [facility] Clarification on Accu-Check Guide test strips

## 2024-08-01 NOTE — Addendum Note (Signed)
 Addended by: Tysheka Fanguy D on: 08/01/2024 08:30 AM   Modules accepted: Orders

## 2024-08-02 ENCOUNTER — Other Ambulatory Visit: Payer: Self-pay

## 2024-08-02 ENCOUNTER — Other Ambulatory Visit: Payer: Self-pay | Admitting: Nurse Practitioner

## 2024-08-02 ENCOUNTER — Telehealth: Payer: Self-pay

## 2024-08-02 MED ORDER — BLOOD GLUCOSE MONITORING SUPPL DEVI: 1.0000 | Freq: Three times a day (TID) | 0 refills | Status: AC

## 2024-08-02 NOTE — Telephone Encounter (Signed)
 Rx sent again to pharmacy with diagnosis code. This was previously done on 9/19

## 2024-08-02 NOTE — Telephone Encounter (Signed)
 Copied from CRM [id number]. Topic: Clinical - Prescription Issue >> Jul 29, 2024  4:27 PM Wess RAMAN wrote: Reason for CRM: Ileana from [facility] stated patient's insurance will not cover Glucometer, test strips, and lancets without diagnosis code. They need to prescription rewritten with diagnosis codes.  Pharmacy: [facility] [address], [place] - [address] [phone] [address] [place] [place] [address] Phone: [phone] Fax: [phone] Hours: Not open 24 hours >> Aug 02, 2024  3:23 PM DeAngela L wrote: Patient calling back to check the status of his Glucometer, test strips, and lancets  Patient states the pharmacy says they can help him get this is the office gives them the Diag code the can complete this information with the insurance provider   Pt num [phone] call the patient for any questions

## 2024-10-11 ENCOUNTER — Encounter: Payer: Self-pay | Admitting: Nurse Practitioner

## 2024-10-11 ENCOUNTER — Ambulatory Visit: Payer: Self-pay | Admitting: Nurse Practitioner

## 2024-10-20 ENCOUNTER — Ambulatory Visit: Admitting: Nurse Practitioner

## 2024-10-20 ENCOUNTER — Encounter: Payer: Self-pay | Admitting: Nurse Practitioner

## 2024-10-20 VITALS — BP 135/74 | HR 58 | Temp 98.1°F | Ht 71.0 in | Wt 199.0 lb

## 2024-10-20 DIAGNOSIS — E559 Vitamin D deficiency, unspecified: Secondary | ICD-10-CM

## 2024-10-20 DIAGNOSIS — E119 Type 2 diabetes mellitus without complications: Secondary | ICD-10-CM | POA: Diagnosis not present

## 2024-10-20 DIAGNOSIS — I152 Hypertension secondary to endocrine disorders: Secondary | ICD-10-CM | POA: Diagnosis not present

## 2024-10-20 DIAGNOSIS — I5033 Acute on chronic diastolic (congestive) heart failure: Secondary | ICD-10-CM | POA: Diagnosis not present

## 2024-10-20 DIAGNOSIS — Z6830 Body mass index (BMI) 30.0-30.9, adult: Secondary | ICD-10-CM | POA: Diagnosis not present

## 2024-10-20 DIAGNOSIS — I480 Paroxysmal atrial fibrillation: Secondary | ICD-10-CM | POA: Diagnosis not present

## 2024-10-20 DIAGNOSIS — E785 Hyperlipidemia, unspecified: Secondary | ICD-10-CM

## 2024-10-20 DIAGNOSIS — E1159 Type 2 diabetes mellitus with other circulatory complications: Secondary | ICD-10-CM | POA: Diagnosis not present

## 2024-10-20 DIAGNOSIS — G8929 Other chronic pain: Secondary | ICD-10-CM

## 2024-10-20 DIAGNOSIS — I10 Essential (primary) hypertension: Secondary | ICD-10-CM | POA: Diagnosis not present

## 2024-10-20 DIAGNOSIS — M797 Fibromyalgia: Secondary | ICD-10-CM | POA: Diagnosis not present

## 2024-10-20 DIAGNOSIS — M5440 Lumbago with sciatica, unspecified side: Secondary | ICD-10-CM | POA: Diagnosis not present

## 2024-10-20 DIAGNOSIS — Z7984 Long term (current) use of oral hypoglycemic drugs: Secondary | ICD-10-CM | POA: Diagnosis not present

## 2024-10-20 LAB — BAYER DCA HB A1C WAIVED: HB A1C (BAYER DCA - WAIVED): 6.5 % — ABNORMAL HIGH (ref 4.8–5.6)

## 2024-10-20 MED ORDER — LOSARTAN POTASSIUM 100 MG PO TABS: ORAL_TABLET | ORAL | 1 refills | Status: AC

## 2024-10-20 MED ORDER — SPIRONOLACTONE 25 MG PO TABS: ORAL_TABLET | ORAL | 1 refills | Status: AC

## 2024-10-20 MED ORDER — SITAGLIPTIN PHOSPHATE 100 MG PO TABS: ORAL_TABLET | ORAL | 1 refills | Status: AC

## 2024-10-20 MED ORDER — ROSUVASTATIN CALCIUM 10 MG PO TABS: 10.0000 mg | ORAL_TABLET | Freq: Every day | ORAL | 1 refills | Status: AC

## 2024-10-20 MED ORDER — METFORMIN HCL 1000 MG PO TABS: ORAL_TABLET | ORAL | 1 refills | Status: AC

## 2024-10-20 MED ORDER — APIXABAN 5 MG PO TABS: ORAL_TABLET | ORAL | 1 refills | Status: AC

## 2024-10-20 MED ORDER — AMLODIPINE BESYLATE 10 MG PO TABS: 10.0000 mg | ORAL_TABLET | Freq: Every day | ORAL | 1 refills | Status: AC

## 2024-10-20 MED ORDER — AMIODARONE HCL 200 MG PO TABS: 100.0000 mg | ORAL_TABLET | Freq: Every day | ORAL | 1 refills | Status: AC

## 2024-10-20 NOTE — Progress Notes (Signed)
 Subjective:    Patient ID: Michael Valenzuela, male    DOB: April 11, 1956, 68 y.o.   MRN: [id number]   Chief Complaint: medical management of chronic issues     HPI:  Michael Valenzuela is a 68 y.o. who identifies as a male who was assigned male at birth.   Social history: Lives with: wife Work history: disability   Comes in today for follow up of the following chronic medical issues:  1. Essential hypertension, benign No c/o chest pain, sob or headache. Does not check blood pressure at home. BP Readings from Last 3 Encounters:  07/27/24 119/66  07/15/24 138/76  05/31/24 138/80     2. Hyperlipidemia with target LDL less than 100 Does not watch diet and does no exercise. Lab Results  Component Value Date   CHOL 126 07/15/2024   HDL 44 07/15/2024   LDLCALC 43 07/15/2024   TRIG 250 (H) 07/15/2024   CHOLHDL 2.9 07/15/2024      3. Diabetes mellitus treated with oral medication (HCC) He does not check his blood sugars very often. Lab Results  Component Value Date   HGBA1C 8.5 (H) 07/15/2024     4. Acute on chronic heart failure with preserved ejection fraction (HFpEF)/in the setting of severe mitral valve prolapse 5. Paroxysmal atrial fibrillation (HCC) Is on amiodrone and sees cardiology every 6 months. Last saw cardiology on  05/04/24- no changes made to plan of care. Has follow up next week  6. Chronic midline low back pain with sciatica, sciatica laterality unspecified 7. Fibromyalgia Sees pain management. Says they are really not helping with his pain but he continues to get meds monthly  8. Vitamin D deficiency Is on daily vitamin d supplement  9. BMI 30.0-30.9,adult Weight is up 6lbs  Wt Readings from Last 3 Encounters:  10/20/24 199 lb (90.3 kg)  07/27/24 193 lb (87.5 kg)  07/22/24 202 lb (91.6 kg)   BMI Readings from Last 3 Encounters:  10/20/24 27.75 kg/m  07/27/24 26.92 kg/m  07/22/24 28.17 kg/m       New complaints: None today  No Known  Allergies Outpatient Encounter Medications as of 10/20/2024  Medication Sig   acetaminophen  (TYLENOL ) 500 MG tablet Take 1,000 mg by mouth at bedtime.   amiodarone  (PACERONE ) 200 MG tablet Take 0.5 tablets (100 mg total) by mouth daily.   amLODipine  (NORVASC ) 10 MG tablet Take 1 tablet (10 mg total) by mouth daily.   apixaban  (ELIQUIS ) 5 MG TABS tablet TAKE 1 TABLET(5 MG) BY MOUTH TWICE DAILY   APPLE CIDER VINEGAR PO Take 450 mg by mouth daily.   Ascorbic Acid (VITAMIN C) 500 MG CAPS Take 500 mg by mouth daily.   bisacodyl  (DULCOLAX) 5 MG EC tablet Take 5 mg by mouth daily as needed for moderate constipation.   Blood Glucose Monitoring Suppl DEVI 1 each by Does not apply route in the morning, at noon, and at bedtime. May substitute to any manufacturer covered by patient's insurance. Dx code E11.9   cyclobenzaprine  (FLEXERIL ) 10 MG tablet Take 1 tablet by mouth three times daily as needed for muscle spasm   famotidine  (PEPCID ) 20 MG tablet Take 20 mg by mouth daily. (Patient taking differently: Take 20 mg by mouth daily as needed.)   Ginkgo Biloba 125 MG CAPS Take 125 mg by mouth daily.   glucose blood (ACCU-CHEK GUIDE TEST) test strip CHECK BS daily Dx E11.9   hydrALAZINE  (APRESOLINE ) 50 MG tablet Take 1 tablet (50 mg total) by mouth  2 (two) times daily.   hydrOXYzine  (ATARAX ) 10 MG tablet Take 10 mg by mouth at bedtime. (Patient taking differently: Take 10 mg by mouth daily as needed.)   losartan  (COZAAR ) 100 MG tablet TAKE 1 TABLET(100 MG) BY MOUTH DAILY   Melatonin 10 MG CAPS Take 10 mg by mouth daily as needed.   Menthol -Camphor (ICY HOT ADVANCED PAIN RELIEF) 16-11 % CREA Apply 1 application topically daily as needed (pain).   metFORMIN  (GLUCOPHAGE ) 1000 MG tablet TAKE 1 TABLET(1000 MG) BY MOUTH TWICE DAILY WITH A MEAL   Multiple Vitamins-Minerals (MULTIVITAMIN WITH MINERALS) tablet Take 1 tablet by mouth daily.   Oxycodone  HCl 20 MG TABS Take 1 tablet by mouth every 6 (six) hours.    polyethylene glycol powder (GLYCOLAX/MIRALAX) 17 GM/SCOOP powder Take 17 g by mouth daily as needed.   rosuvastatin  (CRESTOR ) 10 MG tablet Take 1 tablet (10 mg total) by mouth daily.   sennosides-docusate sodium  (SENOKOT-S) 8.6-50 MG tablet Take 1 tablet by mouth daily.   sitaGLIPtin  (JANUVIA ) 100 MG tablet TAKE 1 TABLET(100 MG) BY MOUTH DAILY   SODIUM BICARBONATE -CITRIC ACID PO Take by mouth.   spironolactone  (ALDACTONE ) 25 MG tablet Take 1 tablet by mouth once daily   Turmeric Curcumin 500 MG CAPS Take 500 mg by mouth daily.   No facility-administered encounter medications on file as of 10/20/2024.    Past Surgical History:  Procedure Laterality Date   CARDIOVERSION N/A 05/17/2021   Procedure: CARDIOVERSION;  Surgeon: Okey Vina GAILS, MD;  Location: [facility];  Service: Cardiovascular;  Laterality: N/A;   COLONOSCOPY N/A 07/27/2024   Procedure: COLONOSCOPY;  Surgeon: Shaaron Lamar HERO, MD;  Location: [facility];  Service: Endoscopy;  Laterality: N/A;  730am, asa 3   HERNIA REPAIR     MAZE N/A 03/08/2021   Procedure: MAZE;  Surgeon: German Bartlett PEDLAR, MD;  Location: [facility] OR;  Service: Open Heart Surgery;  Laterality: N/A;   MITRAL VALVE REPAIR N/A 03/08/2021   Procedure: MITRAL VALVE REPAIR USING  PHYSIO RING SIZE 28 (EXPLANTED) THEN MITRAL VALVE REPLACEMENT (MVR) WITH MAGNA EASE MITRAL VALVE SIZE  ON PUMP, LEFT ATRIAL APPENDAGE CLIPPING USING A SIZE 50 ATRIAL CLIP;  Surgeon: German Bartlett PEDLAR, MD;  Location: [facility] OR;  Service: Open Heart Surgery;  Laterality: N/A;   RIGHT/LEFT HEART CATH AND CORONARY ANGIOGRAPHY N/A 03/05/2021   Procedure: RIGHT/LEFT HEART CATH AND CORONARY ANGIOGRAPHY;  Surgeon: Anner Alm ORN, MD;  Location: [facility] INVASIVE CV LAB;  Service: Cardiovascular;  Laterality: N/A;   TEE WITHOUT CARDIOVERSION N/A 03/05/2021   Procedure: TRANSESOPHAGEAL ECHOCARDIOGRAM (TEE);  Surgeon: Kate Lonni CROME, MD;  Location: [facility] ENDOSCOPY;  Service: Cardiovascular;  Laterality: N/A;   TEE  WITHOUT CARDIOVERSION N/A 03/08/2021   Procedure: TRANSESOPHAGEAL ECHOCARDIOGRAM (TEE);  Surgeon: German Bartlett PEDLAR, MD;  Location: [facility] OR;  Service: Open Heart Surgery;  Laterality: N/A;   TEE WITHOUT CARDIOVERSION N/A 05/17/2021   Procedure: TRANSESOPHAGEAL ECHOCARDIOGRAM (TEE);  Surgeon: Okey Vina GAILS, MD;  Location: [facility];  Service: Cardiovascular;  Laterality: N/A;    Family History  Problem Relation Age of Onset   Heart disease Mother    Diabetes Mother    Cancer Mother    Heart disease Father    Heart attack Brother    Diabetes Son 94       type 1   Hypertension Son    Diabetes Paternal Uncle    Colon cancer Neg Hx       Controlled substance contract: n/a  Review of Systems  Constitutional:  Negative for diaphoresis.  Eyes:  Negative for pain.  Respiratory:  Negative for shortness of breath.   Cardiovascular:  Negative for chest pain, palpitations and leg swelling.  Gastrointestinal:  Negative for abdominal pain.  Endocrine: Negative for polydipsia.  Skin:  Negative for rash.  Neurological:  Negative for dizziness, weakness and headaches.  Hematological:  Does not bruise/bleed easily.  All other systems reviewed and are negative.      Objective:   Physical Exam Vitals and nursing note reviewed.  Constitutional:      Appearance: Normal appearance. He is well-developed.  HENT:     Head: Normocephalic.     Nose: Nose normal.     Mouth/Throat:     Mouth: Mucous membranes are moist.     Pharynx: Oropharynx is clear.  Eyes:     Pupils: Pupils are equal, round, and reactive to light.  Neck:     Thyroid : No thyroid  mass or thyromegaly.     Vascular: No carotid bruit or JVD.     Trachea: Phonation normal.  Cardiovascular:     Rate and Rhythm: Normal rate and regular rhythm.  Pulmonary:     Effort: Pulmonary effort is normal. No respiratory distress.     Breath sounds: Normal breath sounds.  Abdominal:     General: Bowel sounds are normal.     Palpations:  Abdomen is soft.     Tenderness: There is no abdominal tenderness.  Musculoskeletal:        General: Normal range of motion.     Cervical back: Normal range of motion and neck supple.  Lymphadenopathy:     Cervical: No cervical adenopathy.  Skin:    General: Skin is warm and dry.  Neurological:     Mental Status: He is alert and oriented to person, place, and time.  Psychiatric:        Behavior: Behavior normal.        Thought Content: Thought content normal.        Judgment: Judgment normal.     BP 135/74   Pulse (!) 58   Temp 98.1 F (36.7 C) (Temporal)   Ht 5' 11 (1.803 m)   Wt 199 lb (90.3 kg)   SpO2 96%   BMI 27.75 kg/m         Assessment & Plan:  Shana Younge comes in today with chief complaint of medical management of chronic issues    Diagnosis and orders addressed:  1. Essential hypertension, benign Low sodium diet - CBC with Differential/Platelet - CMP14+EGFR - amLODipine  (NORVASC ) 10 MG tablet; Take 1 tablet (10 mg total) by mouth daily.  Dispense: 90 tablet; Refill: 1 - losartan  (COZAAR ) 100 MG tablet; TAKE 1 TABLET(100 MG) BY MOUTH DAILY  Dispense: 90 tablet; Refill: 1 - spironolactone  (ALDACTONE ) 25 MG tablet; Take 1 tablet by mouth once daily  Dispense: 90 tablet; Refill: 1  2. Hyperlipidemia with target LDL less than 100 Low fat diet - Lipid panel - rosuvastatin  (CRESTOR ) 10 MG tablet; Take 1 tablet (10 mg total) by mouth daily.  Dispense: 90 tablet; Refill: 1  3. Diabetes mellitus treated with oral medication (HCC) Continue to watch carbs in diet - Bayer DCA Hb A1c Waived - metFORMIN  (GLUCOPHAGE ) 1000 MG tablet; TAKE 1 TABLET(1000 MG) BY MOUTH TWICE DAILY WITH A MEAL  Dispense: 180 tablet; Refill: 1 - sitaGLIPtin  (JANUVIA ) 100 MG tablet; TAKE 1 TABLET(100 MG) BY MOUTH DAILY  Dispense: 90 tablet; Refill: 1  4. Acute on chronic heart failure with preserved ejection fraction (HFpEF)/in the setting of severe mitral valve prolapse 5. Paroxysmal  atrial fibrillation (HCC) Keep follow up appointment with cardiology - amiodarone  (PACERONE ) 200 MG tablet; Take 0.5 tablets (100 mg total) by mouth daily.  Dispense: 45 tablet; Refill: 1 - apixaban  (ELIQUIS ) 5 MG TABS tablet; TAKE 1 TABLET(5 MG) BY MOUTH TWICE DAILY  Dispense: 180 tablet; Refill: 1  6. Chronic midline low back pain with sciatica, sciatica laterality unspecified 7. Fibromyalgia [facility]  8. Vitamin D deficiency Continue vitamin d supplement   9. BMI 30.0-30.9,adult Discussed diet and exercise for person with BMI >25 Will recheck weight in 3-6 months    Labs pending Health Maintenance reviewed Diet and exercise encouraged  Follow up plan: 6 months   Mary-Margaret Gladis, FNP

## 2024-10-21 ENCOUNTER — Ambulatory Visit: Payer: Self-pay | Admitting: Nurse Practitioner

## 2024-10-21 LAB — CBC WITH DIFFERENTIAL/PLATELET
Basophils Absolute: 0.1 x10E3/uL (ref 0.0–0.2)
Basos: 1 %
EOS (ABSOLUTE): 0.3 x10E3/uL (ref 0.0–0.4)
Eos: 4 %
Hematocrit: 44.7 % (ref 37.5–51.0)
Hemoglobin: 14.7 g/dL (ref 13.0–17.7)
Immature Grans (Abs): 0 x10E3/uL (ref 0.0–0.1)
Immature Granulocytes: 0 %
Lymphocytes Absolute: 1.6 x10E3/uL (ref 0.7–3.1)
Lymphs: 20 %
MCH: 30.2 pg (ref 26.6–33.0)
MCHC: 32.9 g/dL (ref 31.5–35.7)
MCV: 92 fL (ref 79–97)
Monocytes Absolute: 0.7 x10E3/uL (ref 0.1–0.9)
Monocytes: 9 %
Neutrophils Absolute: 5.3 x10E3/uL (ref 1.4–7.0)
Neutrophils: 66 %
Platelets: 258 x10E3/uL (ref 150–450)
RBC: 4.86 x10E6/uL (ref 4.14–5.80)
RDW: 12.6 % (ref 11.6–15.4)
WBC: 8 x10E3/uL (ref 3.4–10.8)

## 2024-10-21 LAB — LIPID PANEL
Chol/HDL Ratio: 3 ratio (ref 0.0–5.0)
Cholesterol, Total: 110 mg/dL (ref 100–199)
HDL: 37 mg/dL — ABNORMAL LOW (ref >39)
LDL Chol Calc (NIH): 31 mg/dL (ref 0–99)
Triglycerides: 284 mg/dL — ABNORMAL HIGH (ref 0–149)
VLDL Cholesterol Cal: 42 mg/dL — ABNORMAL HIGH (ref 5–40)

## 2024-10-21 LAB — VITAMIN B12: Vitamin B-12: 411 pg/mL (ref 232–1245)

## 2024-10-21 LAB — CMP14+EGFR
ALT: 20 IU/L (ref 0–44)
AST: 24 IU/L (ref 0–40)
Albumin: 4.9 g/dL (ref 3.9–4.9)
Alkaline Phosphatase: 62 IU/L (ref 47–123)
BUN/Creatinine Ratio: 26 — ABNORMAL HIGH (ref 10–24)
BUN: 23 mg/dL (ref 8–27)
Bilirubin Total: 0.3 mg/dL (ref 0.0–1.2)
CO2: 20 mmol/L (ref 20–29)
Calcium: 10.1 mg/dL (ref 8.6–10.2)
Chloride: 100 mmol/L (ref 96–106)
Creatinine, Ser: 0.87 mg/dL (ref 0.76–1.27)
Globulin, Total: 2.2 g/dL (ref 1.5–4.5)
Glucose: 153 mg/dL — ABNORMAL HIGH (ref 70–99)
Potassium: 4.7 mmol/L (ref 3.5–5.2)
Sodium: 139 mmol/L (ref 134–144)
Total Protein: 7.1 g/dL (ref 6.0–8.5)
eGFR: 93 mL/min/1.73 (ref >59)

## 2024-10-27 ENCOUNTER — Encounter: Payer: Self-pay | Admitting: Cardiology

## 2024-10-27 ENCOUNTER — Ambulatory Visit: Attending: Cardiology | Admitting: Cardiology

## 2024-10-27 VITALS — BP 132/64 | HR 57 | Ht 71.0 in | Wt 203.0 lb

## 2024-10-27 DIAGNOSIS — I480 Paroxysmal atrial fibrillation: Secondary | ICD-10-CM | POA: Insufficient documentation

## 2024-10-27 DIAGNOSIS — D6869 Other thrombophilia: Secondary | ICD-10-CM | POA: Insufficient documentation

## 2024-10-27 DIAGNOSIS — Z9889 Other specified postprocedural states: Secondary | ICD-10-CM | POA: Diagnosis present

## 2024-10-27 DIAGNOSIS — I10 Essential (primary) hypertension: Secondary | ICD-10-CM | POA: Insufficient documentation

## 2024-10-27 NOTE — Progress Notes (Signed)
 Clinical Summary Michael Valenzuela is a 68 y.o.male seen today for follow up of the following medical problems.    Mitral regurgitation s/p MVR - admission with diagnosis of severe MR 02/2021 - 03/08/2021 MVR with magna ease 25mm tissue valve along with LAA clipping and MAZE procedure - 04/2021 echo MVR mean gradient 10 mmHg.      05/2021 TEE: LVEF 55-60%, MV leaflets thin without restricted motion and no obstruction. Mean grade 5 mmHg, mild MR     - denies SOB/DOE, no LE edema        2. Afib/aflutter - newly diagnosed during 02/2021 admission - s/p LAA clipping and MAZE procedure during his MVR surgery - has been on amio. LAVI is 44  05/17/2021 DCCV     - no palpitations - compliant with meds - on amio, 01/2024 normal TFTs and LFTs. Prior mild LFT elevation has resolved.   - no recent palpitations - no bleeding on eliquis .  - 10/2024 normal LFTs   3. HTN - wants to avoid diuretic, already has chronic frequent urination.  - compliant with meds   4. Hyperlipidemia  04/2023 TC 137 TG 238 HDL 44 LDL 55 07/2023 TC 146 TG 279 HDL 41 LDL 61 - 01/2024 TC 874 TG 697 HDL 38 LDL 41 -10/2024 TC 889 TG 715 HDL 37 LDL 31   5. Chronic pain - chronic fibromylagia, followed by pain clinic. On long term opiates.    Past Medical History:  Diagnosis Date   A-fib [facility])    Afib/Aflutter 2022   Arthritis    Chronic low back pain    Diabetes mellitus without complication (HCC)    Fibromyalgia    Hyperlipidemia    Hypertension    Mitral regurgitation    severe s/p bioprosthetic mitral valve repair in 2022   Vitamin D deficiency      Allergies[1]   Current Outpatient Medications  Medication Sig Dispense Refill   acetaminophen  (TYLENOL ) 500 MG tablet Take 1,000 mg by mouth at bedtime.     amiodarone  (PACERONE ) 200 MG tablet Take 0.5 tablets (100 mg total) by mouth daily. 45 tablet 1   amLODipine  (NORVASC ) 10 MG tablet Take 1 tablet (10 mg total) by mouth daily. 90 tablet 1   apixaban   (ELIQUIS ) 5 MG TABS tablet TAKE 1 TABLET(5 MG) BY MOUTH TWICE DAILY 180 tablet 1   APPLE CIDER VINEGAR PO Take 450 mg by mouth daily.     Ascorbic Acid (VITAMIN C) 500 MG CAPS Take 500 mg by mouth daily.     bisacodyl  (DULCOLAX) 5 MG EC tablet Take 5 mg by mouth daily as needed for moderate constipation.     Blood Glucose Monitoring Suppl DEVI 1 each by Does not apply route in the morning, at noon, and at bedtime. May substitute to any manufacturer covered by patient's insurance. Dx code E11.9 1 each 0   cyclobenzaprine  (FLEXERIL ) 10 MG tablet Take 1 tablet by mouth three times daily as needed for muscle spasm 270 tablet 0   famotidine  (PEPCID ) 20 MG tablet Take 20 mg by mouth daily. (Patient taking differently: Take 20 mg by mouth daily as needed.)     Ginkgo Biloba 125 MG CAPS Take 125 mg by mouth daily.     glucose blood (ACCU-CHEK GUIDE TEST) test strip CHECK BS daily Dx E11.9 100 each 3   hydrALAZINE  (APRESOLINE ) 50 MG tablet Take 1 tablet (50 mg total) by mouth 2 (two) times daily. 180 tablet  3   hydrOXYzine  (ATARAX ) 10 MG tablet Take 10 mg by mouth at bedtime. (Patient taking differently: Take 10 mg by mouth daily as needed.)     losartan  (COZAAR ) 100 MG tablet TAKE 1 TABLET(100 MG) BY MOUTH DAILY 90 tablet 1   Melatonin 10 MG CAPS Take 10 mg by mouth daily as needed.     Menthol -Camphor (ICY HOT ADVANCED PAIN RELIEF) 16-11 % CREA Apply 1 application topically daily as needed (pain).     metFORMIN  (GLUCOPHAGE ) 1000 MG tablet TAKE 1 TABLET(1000 MG) BY MOUTH TWICE DAILY WITH A MEAL 180 tablet 1   Multiple Vitamins-Minerals (MULTIVITAMIN WITH MINERALS) tablet Take 1 tablet by mouth daily.     Oxycodone  HCl 20 MG TABS Take 1 tablet by mouth every 6 (six) hours.     polyethylene glycol powder (GLYCOLAX/MIRALAX) 17 GM/SCOOP powder Take 17 g by mouth daily as needed.     rosuvastatin  (CRESTOR ) 10 MG tablet Take 1 tablet (10 mg total) by mouth daily. 90 tablet 1   sennosides-docusate sodium   (SENOKOT-S) 8.6-50 MG tablet Take 1 tablet by mouth daily.     sitaGLIPtin  (JANUVIA ) 100 MG tablet TAKE 1 TABLET(100 MG) BY MOUTH DAILY 90 tablet 1   SODIUM BICARBONATE -CITRIC ACID PO Take by mouth.     spironolactone  (ALDACTONE ) 25 MG tablet Take 1 tablet by mouth once daily 90 tablet 1   Turmeric Curcumin 500 MG CAPS Take 500 mg by mouth daily.     No current facility-administered medications for this visit.     Past Surgical History:  Procedure Laterality Date   CARDIOVERSION N/A 05/17/2021   Procedure: CARDIOVERSION;  Surgeon: Okey Vina GAILS, MD;  Location: [facility];  Service: Cardiovascular;  Laterality: N/A;   COLONOSCOPY N/A 07/27/2024   Procedure: COLONOSCOPY;  Surgeon: Shaaron Lamar HERO, MD;  Location: [facility];  Service: Endoscopy;  Laterality: N/A;  730am, asa 3   HERNIA REPAIR     MAZE N/A 03/08/2021   Procedure: MAZE;  Surgeon: German Bartlett PEDLAR, MD;  Location: [facility] OR;  Service: Open Heart Surgery;  Laterality: N/A;   MITRAL VALVE REPAIR N/A 03/08/2021   Procedure: MITRAL VALVE REPAIR USING  PHYSIO RING SIZE 28 (EXPLANTED) THEN MITRAL VALVE REPLACEMENT (MVR) WITH MAGNA EASE MITRAL VALVE SIZE  ON PUMP, LEFT ATRIAL APPENDAGE CLIPPING USING A SIZE 50 ATRIAL CLIP;  Surgeon: German Bartlett PEDLAR, MD;  Location: [facility] OR;  Service: Open Heart Surgery;  Laterality: N/A;   RIGHT/LEFT HEART CATH AND CORONARY ANGIOGRAPHY N/A 03/05/2021   Procedure: RIGHT/LEFT HEART CATH AND CORONARY ANGIOGRAPHY;  Surgeon: Anner Alm ORN, MD;  Location: [facility] INVASIVE CV LAB;  Service: Cardiovascular;  Laterality: N/A;   TEE WITHOUT CARDIOVERSION N/A 03/05/2021   Procedure: TRANSESOPHAGEAL ECHOCARDIOGRAM (TEE);  Surgeon: Kate Lonni CROME, MD;  Location: [facility] ENDOSCOPY;  Service: Cardiovascular;  Laterality: N/A;   TEE WITHOUT CARDIOVERSION N/A 03/08/2021   Procedure: TRANSESOPHAGEAL ECHOCARDIOGRAM (TEE);  Surgeon: German Bartlett PEDLAR, MD;  Location: [facility] OR;  Service: Open Heart Surgery;  Laterality: N/A;   TEE  WITHOUT CARDIOVERSION N/A 05/17/2021   Procedure: TRANSESOPHAGEAL ECHOCARDIOGRAM (TEE);  Surgeon: Okey Vina GAILS, MD;  Location: [facility];  Service: Cardiovascular;  Laterality: N/A;     Allergies[2]    Family History  Problem Relation Age of Onset   Heart disease Mother    Diabetes Mother    Cancer Mother    Heart disease Father    Heart attack Brother    Diabetes Son 50  type 1   Hypertension Son    Diabetes Paternal Uncle    Colon cancer Neg Hx      Social History Michael Valenzuela reports that he has been smoking cigarettes. He started smoking about 25 years ago. He has a 5 pack-year smoking history. He has never been exposed to tobacco smoke. He has never used smokeless tobacco. Michael Valenzuela reports that he does not currently use alcohol .    Physical Examination Today's Vitals   10/27/24 1109  BP: 132/64  Pulse: (!) 57  SpO2: 95%  Weight: 203 lb (92.1 kg)  Height: 5' 11 (1.803 m)   Body mass index is 28.31 kg/m.  Gen: resting comfortably, no acute distress HEENT: no scleral icterus, pupils equal round and reactive, no palptable cervical adenopathy,  CV: RRR, no m/rg, no jvd Resp: Clear to auscultation bilaterally GI: abdomen is soft, non-tender, non-distended, normal bowel sounds, no hepatosplenomegaly MSK: extremities are warm, no edema.  Skin: warm, no rash Neuro:  no focal deficits Psych: appropriate affect   Diagnostic Studies  TEE 03/05/21: IMPRESSIONS   1. Left ventricular ejection fraction, by estimation, is 60 to 65%. The  left ventricle has normal function.   2. Right ventricular systolic function is normal. The right ventricular  size is mildly enlarged.   3. Left atrial size was severely dilated. No left atrial/left atrial  appendage thrombus was detected.   4. Right atrial size was mildly dilated.   5. The aortic valve is tricuspid. Aortic valve regurgitation is not  visualized.   6. The mitral valve is abnormal. Flail P2. Severe mitral valve   regurgitation. MR severe by both 2D ERO and 3D VCA. Systolic flow reversal  in pulmonary veins, consistent with severe MR.   Cath 03/05/21: The left ventricular systolic function is normal. The left ventricular ejection fraction is 55-65% by visual estimate. LV end diastolic pressure is mildly elevated. There is moderate (3+) mitral regurgitation. Hemodynamic findings consistent with mild pulmonary hypertension. Angiographically normal coronary arteries   SUMMARY Angiographically Normal Coronary Arteries Mild Pulmonary Hypertension with mean PAP 30 mmHg, PCWP 5 mmHg with LVEDP 15 mmHg. Mild-moderately reduced Cardiac Output-Cardiac Index (Fick) 4.44-2.12.       05/2021 TEE IMPRESSIONS     1. Left ventricular ejection fraction, by estimation, is 55 to 60%. The  left ventricle has normal function.   2. Right ventricular systolic function is normal. The right ventricular  size is normal.   3. LA appendage has been clipped surgically No flow seen.   4. 25 mm Magna ease Mitral valve present (03/08/21) Valve is well seated.  Leaflets thin without significant restricted motion. No obstruction seen.  Peak and mean gradients through the valve are 13 and 5 mm Hg respectively  MVA by PT1/2 is 1.44 cm2. By  planimetry from 3 D images were 1.45 cm2. (HR 65bpm). Mild mitral valve  regurgitation.      Assessment and Plan  Severe MR s/p bioprosthetic MVR -no symptoms, doing well since his prior MV replacement - continue to monitor   2. Afib/aflutter/acquired thrombophilia - no symptoms - EKG shows junction rhythm 57 - continue current meds   3. HTN - avoiding diuretic due to chronic issues already with frequent urination. Low heart rates avoid av nodal agents.  - at goal, continue current meds  F/u 6 months      Dorn PHEBE Ross, M.D.     [1] No Known Allergies [2] No Known Allergies

## 2024-10-27 NOTE — Patient Instructions (Signed)

## 2025-04-18 ENCOUNTER — Ambulatory Visit: Admitting: Nurse Practitioner

## 2025-05-09 ENCOUNTER — Ambulatory Visit: Payer: Self-pay
